# Patient Record
Sex: Female | Born: 1950
Health system: Southern US, Community
[De-identification: ages and names within clinical notes are randomized; demographics above are authoritative.]

## PROBLEM LIST (undated history)

## (undated) ENCOUNTER — Emergency Department (HOSPITAL_COMMUNITY): Admission: EM | Payer: 59 | Source: Home / Self Care

## (undated) DIAGNOSIS — M1990 Unspecified osteoarthritis, unspecified site: Secondary | ICD-10-CM

## (undated) DIAGNOSIS — H5010 Unspecified exotropia: Secondary | ICD-10-CM

## (undated) DIAGNOSIS — N3281 Overactive bladder: Secondary | ICD-10-CM

## (undated) DIAGNOSIS — K219 Gastro-esophageal reflux disease without esophagitis: Secondary | ICD-10-CM

## (undated) DIAGNOSIS — I10 Essential (primary) hypertension: Secondary | ICD-10-CM

## (undated) DIAGNOSIS — H50112 Monocular exotropia, left eye: Secondary | ICD-10-CM

## (undated) DIAGNOSIS — Z973 Presence of spectacles and contact lenses: Secondary | ICD-10-CM

## (undated) DIAGNOSIS — H269 Unspecified cataract: Secondary | ICD-10-CM

## (undated) DIAGNOSIS — Z86018 Personal history of other benign neoplasm: Secondary | ICD-10-CM

## (undated) DIAGNOSIS — D869 Sarcoidosis, unspecified: Secondary | ICD-10-CM

## (undated) DIAGNOSIS — E785 Hyperlipidemia, unspecified: Secondary | ICD-10-CM

## (undated) DIAGNOSIS — D573 Sickle-cell trait: Secondary | ICD-10-CM

## (undated) DIAGNOSIS — M797 Fibromyalgia: Secondary | ICD-10-CM

## (undated) DIAGNOSIS — H3530 Unspecified macular degeneration: Secondary | ICD-10-CM

## (undated) HISTORY — PX: EYE SURGERY: SHX253

## (undated) HISTORY — PX: TONSILLECTOMY: SUR1361

## (undated) HISTORY — DX: Hyperlipidemia, unspecified: E78.5

## (undated) HISTORY — DX: Sickle-cell trait: D57.3

## (undated) HISTORY — DX: Overactive bladder: N32.81

## (undated) HISTORY — DX: Unspecified cataract: H26.9

## (undated) HISTORY — DX: Essential (primary) hypertension: I10

## (undated) HISTORY — DX: Sarcoidosis, unspecified: D86.9

## (undated) HISTORY — PX: KNEE SURGERY: SHX244

## (undated) HISTORY — DX: Gastro-esophageal reflux disease without esophagitis: K21.9

---

## 2010-07-01 ENCOUNTER — Encounter: Payer: Self-pay | Admitting: Internal Medicine

## 2010-07-01 ENCOUNTER — Encounter: Admission: RE | Admit: 2010-07-01 | Discharge: 2010-07-01 | Payer: Self-pay | Admitting: Family Medicine

## 2010-07-01 ENCOUNTER — Ambulatory Visit: Payer: Self-pay | Admitting: Family Medicine

## 2010-07-02 ENCOUNTER — Emergency Department (HOSPITAL_COMMUNITY): Admission: EM | Admit: 2010-07-02 | Discharge: 2010-07-02 | Payer: Self-pay | Admitting: Family Medicine

## 2010-07-09 DIAGNOSIS — D860 Sarcoidosis of lung: Secondary | ICD-10-CM | POA: Insufficient documentation

## 2010-07-09 DIAGNOSIS — R059 Cough, unspecified: Secondary | ICD-10-CM | POA: Insufficient documentation

## 2010-07-09 DIAGNOSIS — D869 Sarcoidosis, unspecified: Secondary | ICD-10-CM

## 2010-07-09 DIAGNOSIS — I10 Essential (primary) hypertension: Secondary | ICD-10-CM | POA: Insufficient documentation

## 2010-07-09 DIAGNOSIS — N3281 Overactive bladder: Secondary | ICD-10-CM | POA: Insufficient documentation

## 2010-07-09 DIAGNOSIS — N318 Other neuromuscular dysfunction of bladder: Secondary | ICD-10-CM

## 2010-07-09 DIAGNOSIS — R05 Cough: Secondary | ICD-10-CM

## 2010-07-11 ENCOUNTER — Ambulatory Visit: Payer: Self-pay | Admitting: Internal Medicine

## 2010-07-29 ENCOUNTER — Ambulatory Visit: Payer: Self-pay | Admitting: Family Medicine

## 2010-08-13 ENCOUNTER — Ambulatory Visit: Payer: Self-pay | Admitting: Internal Medicine

## 2010-09-24 ENCOUNTER — Ambulatory Visit: Payer: Self-pay | Admitting: Internal Medicine

## 2010-10-14 ENCOUNTER — Inpatient Hospital Stay (HOSPITAL_COMMUNITY): Admission: RE | Admit: 2010-10-14 | Discharge: 2010-10-17 | Payer: Self-pay | Admitting: Obstetrics and Gynecology

## 2010-10-14 ENCOUNTER — Encounter (INDEPENDENT_AMBULATORY_CARE_PROVIDER_SITE_OTHER): Payer: Self-pay | Admitting: Obstetrics and Gynecology

## 2010-10-14 HISTORY — PX: TOTAL ABDOMINAL HYSTERECTOMY W/ BILATERAL SALPINGOOPHORECTOMY: SHX83

## 2010-10-25 ENCOUNTER — Inpatient Hospital Stay (HOSPITAL_COMMUNITY)
Admission: AD | Admit: 2010-10-25 | Discharge: 2010-10-25 | Payer: Self-pay | Source: Home / Self Care | Admitting: Obstetrics and Gynecology

## 2010-12-08 ENCOUNTER — Telehealth: Payer: Self-pay | Admitting: Internal Medicine

## 2010-12-30 NOTE — Assessment & Plan Note (Signed)
Summary: Pulmonary/ ext ov with hfa @ 75% p coaching   Copy to:  Dr. Standley Dakins Primary Provider/Referring Provider:  Dr. Standley Dakins  CC:  4 wk followup.  Pt states that her breathing is some better since last seen.  She states that she was put back on prednisone taper 2 wks ago by her PCP due to wheezing- this helped and now she states that wheezing has resolved.  She also states that her cough is gone.  She states that she has noticed worsening vision- blurry x 6 months-.  History of Present Illness: 47 yobf never smoker dx with sarcoid in 2006 originally  presenting with cough dx Dr Gardiner Barefoot in [place] at [facility] by LN bx rec short courses of prednisone and supposed  maintain symbicort  but failed to do so and low grade cough chronically.    July 11, 2010 maintained on ACE cc cough 1st pulmonary office eval moved to GS0 permanently and worse dry  coughing again around first July and didn't find the symbicort worked and main issue is dry cough to point of vomiting. Mild sob mostly with coughing.  rec  Stop enalapril Start benicar 40 one daily (take one half if too strong and get light headed standing) > did fine on one half.  August 13, 2010 4 wk followup.  Pt states that her breathing is some better since last seen.  She states that she was put back on prednisone taper 2 wks ago by her PCP due to wheezing- this helped and now she states that wheezing has resolved.  She also states that her cough is gone.  She states that she has noticed worsening vision- blurry x 6 months- no arthralgias or rash.  Pt denies any significant sore throat, dysphagia, itching, sneezing,  nasal congestion or excess secretions,  fever, chills, sweats, unintended wt loss, pleuritic or exertional cp, hempoptysis,  pnd or leg swelling.  all better today after last dose of Prednisone 9/10.   Current Medications (verified): 1)  Enablex 7.5 Mg Xr24h-Tab (Darifenacin Hydrobromide) .Marland Kitchen.. 1 Once Daily 2)   Amlodipine Besylate 5 Mg Tabs (Amlodipine Besylate) .Marland Kitchen.. 1 Once Daily 3)  Benicar 40 Mg  Tabs (Olmesartan Medoxomil) .... One Tablet By Mouth Daily 4)  Symbicort 160-4.5 Mcg/act Aero (Budesonide-Formoterol Fumarate) .... 2 Puffs Two Times A Day Only If Needed  Allergies (verified): 1)  ! Lortab  Past History:  Past Medical History: OVERACTIVE BLADDER (ICD-596.51) COUGH, CHRONIC (ICD-786.2) HYPERTENSION (ICD-401.9)     - Ace d/c July 11, 2010 due to cough SARCOIDOSIS (ICD-135)    - dx 2006 [facility]    - HFA 25-75% p coaching August 13, 2010   Vital Signs:  Patient profile:   60 year old female Weight:      167 pounds O2 Sat:      97 % on Room air Temp:     97.6 degrees F oral Pulse rate:   76 / minute BP sitting:   140 / 80  (left arm)  Vitals Entered By: Vernie Murders (August 13, 2010 11:40 AM)  O2 Flow:  Room air  Physical Exam  Additional Exam:  wt 163  > 163 July 11, 2010  > 167 August 13, 2010  amb bf with  no pseudowheeze  HEENT: nl dentition, turbinates, and orophanx. Nl external ear canals without cough reflex NECK :  without JVD/Nodes/TM/ nl carotid upstrokes bilaterally LUNGS: no acc muscle use, clear to A and P bilaterally without cough  on insp or exp maneuvers CV:  RRR  no s3 or murmur or increase in P2, no edema  ABD:  soft and nontender with nl excursion in the supine position. No bruits or organomegaly, bowel sounds nl MS:  warm without deformities, calf tenderness, cyanosis or clubbing       Impression & Recommendations:  Problem # 1:  SARCOIDOSIS (ICD-135) ? airway involvement suggested by recurrent cough requiring prednisone off ace and with poor hfa technique  I spent extra time with the patient today explaining optimal mdi  technique.  This improved from  25 -75% p coaching.  The goal with a chronic steroid dependent illness is always arriving at the lowest effective dose that controls the disease/symptoms and not accepting a  set "formula" which is based on statistics that don't take into accound individual variability or the natural hx of the dz in every individual patient, which may well vary over time. For now try to maintain floor of "0" prednisone and have her come back for pft's.  Eye eval strongly encouraged, says can use her husband's eye doctor though can't recall his name.]  See instructions for specific recommendations   Problem # 2:  HYPERTENSION (ICD-401.9)  Her updated medication list for this problem includes:    Amlodipine Besylate 5 Mg Tabs (Amlodipine besylate) .Marland Kitchen... 1 once daily    Benicar 40 Mg Tabs (Olmesartan medoxomil) ..... One tablet by mouth daily  ok off ace, continue benicar for now     Other Orders: Est. Patient Level IV (16109)  Patient Instructions: 1)  Continue benicar 40 one half daily 2)  Symbicort 2 puffs first thing  in am and 2 puffs again in pm about 12 hours later  3)  Work on inhaler technique:  relax and blow all the way out then take a nice smooth deep breath back in, triggering the inhaler at same time you start breathing in and hold a few secs and rinse mouth 4)  Please schedule a follow-up appointment in 6 weeks, sooner if needed with PFT's. 5)  BE SURE TO SEE YOUR EYE DOCTOR AND LET HIM KNOW ABOUT YOUR SARCOIDOSIS

## 2010-12-30 NOTE — Assessment & Plan Note (Signed)
Summary: Pulmonary/ ext f/u hfa 75%   Copy to:  Dr. Standley Dakins Primary Provider/Referring Provider:  Dr. Standley Dakins  CC:  Cough- the same.  History of Present Illness: 48  yobf never smoker dx with sarcoid in 2006 originally  presenting with cough dx Dr Gardiner Barefoot in [place] at [facility] by LN bx rec short courses of prednisone and supposed  maintain symbicort  but failed to do so and low grade cough chronically.    July 11, 2010 maintained on ACE cc cough 1st pulmonary office eval moved to GS0 permanently and worse dry  coughing again around first July and didn't find the symbicort worked and main issue is dry cough to point of vomiting. Mild sob mostly with coughing.  rec  Stop enalapril Start benicar 40 one daily (take one half if too strong and get light headed standing) > did fine on one half.  August 13, 2010 4 wk followup.  Pt states that her breathing is some better since last seen.  She states that she was put back on prednisone taper 2 wks ago by her PCP due to wheezing- this helped and now she states that wheezing has resolved.  She also states that her cough is gone.  She states that she has noticed worsening vision- blurry x 6 months- no arthralgias or rash.  rec Continue benicar 40 one half daily Symbicort 2 puffs first thing  in am and 2 puffs again in pm about 12 hours later  Work on inhaler technique:  . BE SURE TO SEE YOUR EYE DOCTOR AND LET HIM KNOW ABOUT YOUR SARCOIDOSIS  September 24, 2010 ov cc cough resumed three  weeks off prednisone  while on symbicort 160 up to every 4 hours,  dry cough intermittent day > night. no new ocular or articular symptoms, no sob. Pt denies any significant sore throat, dysphagia, itching, sneezing,  nasal congestion or excess secretions,  fever, chills, sweats, unintended wt loss, pleuritic or exertional cp, hempoptysis, change in activity tolerance  orthopnea pnd or leg swelling   Current Medications (verified): 1)  Enablex  7.5 Mg Xr24h-Tab (Darifenacin Hydrobromide) .Marland Kitchen.. 1 Once Daily 2)  Amlodipine Besylate 5 Mg Tabs (Amlodipine Besylate) .Marland Kitchen.. 1 Once Daily 3)  Benicar 40 Mg  Tabs (Olmesartan Medoxomil) .... One Tablet By Mouth Daily 4)  Symbicort 160-4.5 Mcg/act Aero (Budesonide-Formoterol Fumarate) .... 2 Puffs Four Times A Day  Allergies (verified): 1)  ! Lortab  Past History:  Past Medical History: OVERACTIVE BLADDER (ICD-596.51) COUGH, CHRONIC (ICD-786.2) HYPERTENSION (ICD-401.9)     - Ace d/c July 11, 2010 due to cough SARCOIDOSIS (ICD-135)    - dx 2006 [facility]    - HFA 25-75% p coaching August 13, 2010  > 75% September 24, 2010     - PFT's September 24, 2010 >>  VC  2.63 (73%) with DLCO 70% and no obstruction  Vital Signs:  Patient profile:   60 year old female Weight:      171 pounds BMI:     30.40 O2 Sat:      98 % on Room air Temp:     97.8 degrees F oral Pulse rate:   106 / minute BP sitting:   144 / 90  (left arm)  Vitals Entered By: Vernie Murders (September 24, 2010 9:47 AM)  O2 Flow:  Room air  Physical Exam  Additional Exam:  wt 163  > 163 July 11, 2010  > 167 August 13, 2010 > 171  September 24, 2010  amb bf with  no pseudowheeze  HEENT: nl dentition, turbinates, and orophanx. Nl external ear canals without cough reflex NECK :  without JVD/Nodes/TM/ nl carotid upstrokes bilaterally LUNGS: no acc muscle use, clear to A and P bilaterally without cough on insp or exp maneuvers CV:  RRR  no s3 or murmur or increase in P2, no edema  ABD:  soft and nontender with nl excursion in the supine position. No bruits or organomegaly, bowel sounds nl MS:  warm without deformities, calf tenderness, cyanosis or clubbing       Impression & Recommendations:  Problem # 1:  SARCOIDOSIS (ICD-135) The goal with a chronic steroid dependent illness is always arriving at the lowest effective dose that controls the disease/symptoms and not accepting a set "formula" which is based on  statistics that don't take into accound individual variability or the natural hx of the dz in every individual patient, which may well vary over time. for now would like to get by on just topical steroids if possible but first she'll have to learn how to use hfa more effectively  I spent extra time with the patient today explaining optimal mdi  technique.  This improved from  50-75%  Ocular eval pending  Problem # 2:  COUGH, CHRONIC (ICD-786.2)  steroid responsive so probably due to sarcoid but may be developing cyclical component with secondary gerd. try diet first and low threahold to add trial of ppi if worsens rather than restart prednisone  Orders: Est. Patient Level IV (96045)  Medications Added to Medication List This Visit: 1)  Symbicort 160-4.5 Mcg/act Aero (Budesonide-formoterol fumarate) .... 2 puffs four times a day 2)  Symbicort 160-4.5 Mcg/act Aero (Budesonide-formoterol fumarate) .... 2 puffs first thing  in am and 2 puffs again in pm about 12 hours later  Patient Instructions: 1)  Symbicort 160 2 puffs first thing  in am and 2 puffs again in pm about 12 hours later 2)  Work on inhaler technique:  relax and blow all the way out then take a nice smooth deep breath back in, triggering the inhaler at same time you start breathing in 3)  GERD (REFLUX)  is a common cause of respiratory symptoms. It commonly presents without heartburn and can be treated with medication, but also with lifestyle changes including avoidance of late meals, excessive alcohol, smoking cessation, and avoid fatty foods, chocolate, peppermint, colas, red wine, and acidic juices such as orange juice. NO MINT OR MENTHOL PRODUCTS SO NO COUGH DROPS  4)  USE SUGARLESS CANDY INSTEAD (jolley ranchers)  5)  NO OIL BASED VITAMINS  6)  Please schedule a follow-up appointment in 6 weeks, sooner if needed   Clinical Reports Reviewed:  CXR:  07/01/2010: CXR Results:  c/w stage III sarcoid

## 2010-12-30 NOTE — Miscellaneous (Signed)
Summary: Orders Update pft charges  Clinical Lists Changes  Orders: Added new Service order of Carbon Monoxide diffusing w/capacity (94720) - Signed Added new Service order of Spirometry (Pre & Post) (94060) - Signed Added new Service order of Lung Volumes (94240) - Signed 

## 2010-12-30 NOTE — Assessment & Plan Note (Signed)
Summary: Pulmonary consultation/  Sarcoidosis/ cough ? ace   Visit Type:  Initial Consult Copy to:  Dr. Standley Dakins Primary Kathy Kathy [name]/Referring Kathy Kathy [name]:  Dr. Standley Dakins  CC:  Dyspnea.  History of Present Illness: 60 Kathy [name] never smoker dx with sarcoid in 2006 originally  presenting with cough dx Dr Gardiner Barefoot in [place] at [facility] by LN bx rec short courses of prednisone and supposed  maintain symbicort  but failed to do so and low grade cough chronically.    July 11, 2010 maintained on ACE cc cough   1st pulmonary office eval moved to GS0 permanently and worse dry  coughing again around first July and didn't find the symbicort worked and main issue is dry cough to point of vomiting. Mild sob mostly with coughing.  Pt denies any significant sore throat, dysphagia, itching, sneezing,  nasal congestion or excess secretions,  fever, chills, sweats, unintended wt loss, pleuritic or exertional cp, hempoptysis, change in activity tolerance  orthopnea pnd or leg swelling.  Pt also denies any obvious fluctuation in symptoms with weather or environmental change or other alleviating or aggravating factors.       Current Medications (verified): 1)  Enablex 7.5 Mg Xr24h-Tab (Darifenacin Hydrobromide) .Marland Kitchen.. 1 Once Daily 2)  Amlodipine Besylate 5 Mg Tabs (Amlodipine Besylate) .Marland Kitchen.. 1 Once Daily 3)  Enalapril Maleate 20 Mg Tabs (Enalapril Maleate) .Marland Kitchen.. 1 Once Daily 4)  Symbicort 160-4.5 Mcg/act Aero (Budesonide-Formoterol Fumarate) .... 2 Puffs Two Times A Day  Allergies (verified): 1)  ! Lortab  Past History:  Past Medical History: OVERACTIVE BLADDER (ICD-596.51) COUGH, CHRONIC (ICD-786.2) HYPERTENSION (ICD-401.9)     - Ace d/c July 11, 2010 due to cough SARCOIDOSIS (ICD-135)    - dx 2006 [facility]  Past Surgical History: Lung Bx 2006- dx w/ sarcoid Tubal ligation 1978  Family History: Negative for respiratory diseases or atopy   Social  History: Married Children Unemployed Never smoker ETOH- Beer daily  Review of Systems       The patient complains of shortness of breath with activity, productive cough, loss of appetite, weight change, and joint stiffness or pain.  The patient denies shortness of breath at rest, non-productive cough, coughing up blood, chest pain, irregular heartbeats, acid heartburn, indigestion, abdominal pain, difficulty swallowing, sore throat, tooth/dental problems, headaches, nasal congestion/difficulty breathing through nose, sneezing, itching, ear ache, anxiety, depression, hand/feet swelling, rash, change in color of mucus, and fever.    Vital Signs:  Patient profile:   60 year old Kathy [name] Height:      63 inches Weight:      163.50 pounds BMI:     29.07 O2 Sat:      98 % on Room air Temp:     98.2 degrees F oral Pulse rate:   76 / minute BP sitting:   142 / 84  (left arm)  Vitals Entered By: Vernie Murders (July 11, 2010 12:07 PM)  O2 Flow:  Room air  Physical Exam  Additional Exam:  wt 163  > 163 July 11, 2010  amb bf with pseudowheeze resolves with purse lip maneuver  HEENT: nl dentition, turbinates, and orophanx. Nl external ear canals without cough reflex NECK :  without JVD/Nodes/TM/ nl carotid upstrokes bilaterally LUNGS: no acc muscle use, clear to A and P bilaterally without cough on insp or exp maneuvers CV:  RRR  no s3 or murmur or increase in P2, no edema  ABD:  soft and nontender with nl excursion in the supine position.  No bruits or organomegaly, bowel sounds nl MS:  warm without deformities, calf tenderness, cyanosis or clubbing SKIN: warm and dry without lesions   NEURO:  alert, approp, no deficits     CXR  Procedure date:  07/01/2010  Findings:      c/w stage III sarcoid  Impression & Recommendations:  Problem # 1:  COUGH, CHRONIC (ICD-786.2)  DDX of  difficult airways managment all start with A and  include Adherence, Ace Inhibitors, Acid Reflux, Active  Sinus Disease, Alpha 1 Antitripsin deficiency, Anxiety masquerading as Airways dz,  ABPA,  allergy(esp in young), Aspiration (esp in elderly), Adverse effects of DPI,  Active smokers, plus one B  = Beta blocker use..   Ace inhibitor use extremely problematic here and needs to be eliminated x 4-6 weeks befor considering additional w/u   ? Acid reflux: note coughing so hard vomiting so clearly reluxing but this may be effect and not the cause.   Problem # 2:  HYPERTENSION (ICD-401.9)  . The following medications were removed from the medication list:    Enalapril Maleate 20 Mg Tabs (Enalapril maleate) .Marland Kitchen... 1 once daily Her updated medication list for this problem includes:    Amlodipine Besylate 5 Mg Tabs (Amlodipine besylate) .Marland Kitchen... 1 once daily    Benicar 40 Mg Tabs (Olmesartan medoxomil) ..... One tablet by mouth daily   ACE inhibitors are problematic in  pts with airway complaints because  even experienced pulmonologists can't always distinguish ace effects from copd/asthma.  By themselves they don't actually cause a problem, much like oxygen can't by itself start a fire, but they certainly serve as a powerful catalyst or enhancer for any "fire"  or inflammatory process in the upper airway, be it caused by an ET  tube or more commonly reflux (especially in the obese or pts with known GERD or who are on biphoshonates).  In the era of ARB near equivalency until we have a better handle on the reversibility of the airway problem, it just makes sense to avoid ace entirely in the short run and then decide later, having established a level of airway control using a reasonable limited regimen, whether to add back ace but even then being very careful to observe the pt for worsening airway control and number of meds used/ needed to control symptoms.   Given the overlap between her main co from sarcoid and the main c/o from ace effect it just makes the most sense to avoid this class  completely  Orders: Consultation Level V (16109)  Problem # 3:  SARCOIDOSIS (ICD-135) cxr does not suggest active sarcoid but probably ok to use as needed symbicort to control any component of airways dz from this source.  The goal with a chronic steroid dependent illness is always arriving at the lowest effective dose that controls the disease/symptoms and not accepting a set "formula" which is based on statistics that don't take into accound individual variability or the natural hx of the dz in every individual patient, which may well vary over time.  for now will just rx x 6 days to control acute symptoms then regroup in 4 weeks off ace and see where she stands in terms of symptom control.  Medications Added to Medication List This Visit: 1)  Enablex 7.5 Mg Xr24h-tab (Darifenacin hydrobromide) .Marland Kitchen.. 1 once daily 2)  Amlodipine Besylate 5 Mg Tabs (Amlodipine besylate) .Marland Kitchen.. 1 once daily 3)  Enalapril Maleate 20 Mg Tabs (Enalapril maleate) .Marland Kitchen.. 1 once daily 4)  Benicar 40 Mg Tabs (Olmesartan medoxomil) .... One tablet by mouth daily 5)  Symbicort 160-4.5 Mcg/act Aero (Budesonide-formoterol fumarate) .... 2 puffs two times a day 6)  Symbicort 160-4.5 Mcg/act Aero (Budesonide-formoterol fumarate) .... 2 puffs two times a day only if needed 7)  Prednisone 10 Mg Tabs (Prednisone) .... 4 each am x 2days, 2x2days, 1x2days and stop  Patient Instructions: 1)  Stop enalapril 2)  Start benicar 40 one daily (take one half if too strong and get light headed standing) 3)  GERD (REFLUX)  is a common cause of respiratory symptoms. It commonly presents without heartburn and can be treated with medication, but also with lifestyle changes including avoidance of late meals, excessive alcohol, smoking cessation, and avoid fatty foods, chocolate, peppermint, colas, red wine, and acidic juices such as orange juice. NO MINT OR MENTHOL PRODUCTS SO NO COUGH DROPS  4)  USE SUGARLESS CANDY INSTEAD (jolley ranchers)  5)   NO OIL BASED VITAMINS  6)  Please schedule a follow-up appointment in 4  weeks, sooner if needed   Prescriptions: PREDNISONE 10 MG  TABS (PREDNISONE) 4 each am x 2days, 2x2days, 1x2days and stop  #14 x 0   Entered and Authorized by:   Nyoka Cowden MD   Signed by:   Nyoka Cowden MD on 07/11/2010   Method used:   Electronically to        [facility]  [address] [phone][facility] (retail)       [address]       [place], [place]  [address]       Ph: [phone] or [phone]       Fax: [phone]   RxID:   [id number]

## 2010-12-31 ENCOUNTER — Ambulatory Visit (INDEPENDENT_AMBULATORY_CARE_PROVIDER_SITE_OTHER): Payer: 59 | Admitting: Family Medicine

## 2010-12-31 DIAGNOSIS — I10 Essential (primary) hypertension: Secondary | ICD-10-CM

## 2010-12-31 DIAGNOSIS — M25519 Pain in unspecified shoulder: Secondary | ICD-10-CM

## 2010-12-31 DIAGNOSIS — M25569 Pain in unspecified knee: Secondary | ICD-10-CM

## 2011-01-01 NOTE — Progress Notes (Signed)
Summary: refill on benicar  Phone Note Call from Patient Call back at [phone]   Caller: Patient Call For: Hasset Chaviano Reason for Call: Refill Medication Summary of Call: Requests refill on benicar 40mg .[address] Initial call taken by: Darletta Moll,  December 08, 2010 12:04 PM  Follow-up for Phone Call        rx sent. pt aware. Carron Curie CMA  December 08, 2010 12:13 PM     Prescriptions: BENICAR 40 MG  TABS (OLMESARTAN MEDOXOMIL) One tablet by mouth daily  #30 x 6   Entered by:   Carron Curie CMA   Authorized by:   Nyoka Cowden MD   Signed by:   Carron Curie CMA on 12/08/2010   Method used:   [facility]  [address] [phone]* (retail)       [address]       [place], [place]  [address]       Ph: [phone] or [phone]       Fax: [phone]   RxID:   [id number]

## 2011-01-15 ENCOUNTER — Ambulatory Visit (INDEPENDENT_AMBULATORY_CARE_PROVIDER_SITE_OTHER): Payer: 59 | Admitting: Family Medicine

## 2011-01-15 DIAGNOSIS — M25569 Pain in unspecified knee: Secondary | ICD-10-CM

## 2011-01-15 DIAGNOSIS — M2550 Pain in unspecified joint: Secondary | ICD-10-CM

## 2011-01-15 DIAGNOSIS — IMO0001 Reserved for inherently not codable concepts without codable children: Secondary | ICD-10-CM

## 2011-01-25 ENCOUNTER — Inpatient Hospital Stay (INDEPENDENT_AMBULATORY_CARE_PROVIDER_SITE_OTHER)
Admission: RE | Admit: 2011-01-25 | Discharge: 2011-01-25 | Disposition: A | Discharge status: Home / Self Care | Payer: 59 | Source: Ambulatory Visit | Attending: Family Medicine | Admitting: Family Medicine

## 2011-01-25 ENCOUNTER — Emergency Department (HOSPITAL_COMMUNITY): Payer: 59

## 2011-01-25 ENCOUNTER — Ambulatory Visit (HOSPITAL_COMMUNITY): Payer: 59

## 2011-01-25 ENCOUNTER — Emergency Department (HOSPITAL_COMMUNITY)
Admission: EM | Admit: 2011-01-25 | Discharge: 2011-01-26 | Disposition: A | Discharge status: Home / Self Care | Payer: 59 | Attending: Emergency Medicine | Admitting: Emergency Medicine

## 2011-01-25 DIAGNOSIS — R42 Dizziness and giddiness: Secondary | ICD-10-CM | POA: Insufficient documentation

## 2011-01-25 DIAGNOSIS — R51 Headache: Secondary | ICD-10-CM | POA: Insufficient documentation

## 2011-01-25 DIAGNOSIS — D869 Sarcoidosis, unspecified: Secondary | ICD-10-CM | POA: Insufficient documentation

## 2011-01-25 DIAGNOSIS — Z9889 Other specified postprocedural states: Secondary | ICD-10-CM | POA: Insufficient documentation

## 2011-01-25 DIAGNOSIS — I10 Essential (primary) hypertension: Secondary | ICD-10-CM | POA: Insufficient documentation

## 2011-01-25 DIAGNOSIS — Z79899 Other long term (current) drug therapy: Secondary | ICD-10-CM | POA: Insufficient documentation

## 2011-01-25 LAB — POCT I-STAT, CHEM 8
BUN: 15 mg/dL (ref 6–23)
Glucose, Bld: 104 mg/dL — ABNORMAL HIGH (ref 70–99)
HCT: 44 % (ref 36.0–46.0)
TCO2: 27 mmol/L (ref 0–100)

## 2011-01-28 ENCOUNTER — Inpatient Hospital Stay (INDEPENDENT_AMBULATORY_CARE_PROVIDER_SITE_OTHER): Payer: 59 | Admitting: Family Medicine

## 2011-01-28 DIAGNOSIS — I10 Essential (primary) hypertension: Secondary | ICD-10-CM

## 2011-01-28 DIAGNOSIS — Z79899 Other long term (current) drug therapy: Secondary | ICD-10-CM

## 2011-01-28 DIAGNOSIS — G44209 Tension-type headache, unspecified, not intractable: Secondary | ICD-10-CM

## 2011-02-10 LAB — URINALYSIS, ROUTINE W REFLEX MICROSCOPIC
Glucose, UA: NEGATIVE mg/dL
Hgb urine dipstick: NEGATIVE
Nitrite: NEGATIVE
Specific Gravity, Urine: 1.01 (ref 1.005–1.030)

## 2011-02-10 LAB — CBC
HCT: 32.2 % — ABNORMAL LOW (ref 36.0–46.0)
HCT: 43.4 % (ref 36.0–46.0)
Hemoglobin: 10.9 g/dL — ABNORMAL LOW (ref 12.0–15.0)
MCHC: 33.7 g/dL (ref 30.0–36.0)
MCHC: 33.9 g/dL (ref 30.0–36.0)
MCV: 87.3 fL (ref 78.0–100.0)
Platelets: 238 10*3/uL (ref 150–400)
RBC: 3.69 MIL/uL — ABNORMAL LOW (ref 3.87–5.11)
RBC: 5.03 MIL/uL (ref 3.87–5.11)
RDW: 13.7 % (ref 11.5–15.5)
WBC: 4.7 10*3/uL (ref 4.0–10.5)

## 2011-02-10 LAB — MRSA CULTURE

## 2011-02-10 LAB — COMPREHENSIVE METABOLIC PANEL
ALT: 18 U/L (ref 0–35)
Albumin: 4.1 g/dL (ref 3.5–5.2)
Sodium: 140 mEq/L (ref 135–145)

## 2011-02-10 LAB — PROTIME-INR
INR: 1.13 (ref 0.00–1.49)
Prothrombin Time: 14.7 seconds (ref 11.6–15.2)

## 2011-02-10 LAB — HEMOGLOBIN AND HEMATOCRIT, BLOOD: Hemoglobin: 12.6 g/dL (ref 12.0–15.0)

## 2011-02-10 LAB — APTT: aPTT: 38 seconds — ABNORMAL HIGH (ref 24–37)

## 2011-02-10 LAB — SURGICAL PCR SCREEN: MRSA, PCR: INVALID — AB

## 2011-02-26 ENCOUNTER — Ambulatory Visit (INDEPENDENT_AMBULATORY_CARE_PROVIDER_SITE_OTHER): Payer: 59 | Admitting: Family Medicine

## 2011-02-26 DIAGNOSIS — I10 Essential (primary) hypertension: Secondary | ICD-10-CM

## 2011-02-26 DIAGNOSIS — Z79899 Other long term (current) drug therapy: Secondary | ICD-10-CM

## 2011-03-26 ENCOUNTER — Ambulatory Visit (INDEPENDENT_AMBULATORY_CARE_PROVIDER_SITE_OTHER): Payer: 59 | Admitting: Family Medicine

## 2011-03-26 DIAGNOSIS — R300 Dysuria: Secondary | ICD-10-CM

## 2011-03-26 DIAGNOSIS — I10 Essential (primary) hypertension: Secondary | ICD-10-CM

## 2011-03-26 DIAGNOSIS — Z79899 Other long term (current) drug therapy: Secondary | ICD-10-CM

## 2011-08-25 ENCOUNTER — Encounter: Payer: Self-pay | Admitting: Family Medicine

## 2011-08-28 ENCOUNTER — Ambulatory Visit (INDEPENDENT_AMBULATORY_CARE_PROVIDER_SITE_OTHER): Payer: 59 | Admitting: Family Medicine

## 2011-08-28 ENCOUNTER — Encounter: Payer: Self-pay | Admitting: Family Medicine

## 2011-08-28 VITALS — BP 110/80 | HR 95 | Wt 184.0 lb

## 2011-08-28 DIAGNOSIS — M129 Arthropathy, unspecified: Secondary | ICD-10-CM

## 2011-08-28 DIAGNOSIS — IMO0001 Reserved for inherently not codable concepts without codable children: Secondary | ICD-10-CM

## 2011-08-28 DIAGNOSIS — M1990 Unspecified osteoarthritis, unspecified site: Secondary | ICD-10-CM

## 2011-08-28 DIAGNOSIS — Z23 Encounter for immunization: Secondary | ICD-10-CM

## 2011-08-28 DIAGNOSIS — M797 Fibromyalgia: Secondary | ICD-10-CM

## 2011-08-28 NOTE — Patient Instructions (Signed)
Take 2 Aleve twice a day for the next one or 2 weeks. Then call me and let me know how you're doing

## 2011-08-28 NOTE — Progress Notes (Signed)
  Subjective:    Patient ID: Kathy [name], female    DOB: 11-13-1951, 60 y.o.   MRN: [id number]  HPI She continues to have difficulty with tingling sensation in her hands as well as elbow, shoulder pain and knee pain. The record was reviewed from her rheumatologist. He was felt to be multifactorial in nature. She has been using Tylenol without much success.  Review of Systems     Objective:   Physical Exam Alert and in no distress. Normal sensation in her hands. Normal strength. No joint swelling noted in her hands, wrists, elbows, knees or ankles.       Assessment & Plan:   1. Fibromyalgia   2. Arthritis    she is to take 2 Aleve twice a day and call me in one week

## 2011-10-26 ENCOUNTER — Telehealth: Payer: Self-pay | Admitting: Family Medicine

## 2011-10-26 MED ORDER — BUDESONIDE-FORMOTEROL FUMARATE 160-4.5 MCG/ACT IN AERO: 2.0000 | INHALATION_SPRAY | Freq: Two times a day (BID) | RESPIRATORY_TRACT | Status: DC

## 2011-10-26 NOTE — Telephone Encounter (Signed)
Is this okay to refill? 

## 2011-10-26 NOTE — Telephone Encounter (Signed)
Go ahead and refill this 

## 2011-10-26 NOTE — Telephone Encounter (Signed)
Refilled pt med 

## 2011-12-01 HISTORY — PX: ABDOMINAL HYSTERECTOMY: SHX81

## 2012-01-28 ENCOUNTER — Encounter: Payer: Self-pay | Admitting: Family Medicine

## 2012-01-28 ENCOUNTER — Ambulatory Visit (INDEPENDENT_AMBULATORY_CARE_PROVIDER_SITE_OTHER): Payer: 59 | Admitting: Family Medicine

## 2012-01-28 DIAGNOSIS — D869 Sarcoidosis, unspecified: Secondary | ICD-10-CM

## 2012-01-28 DIAGNOSIS — Z79899 Other long term (current) drug therapy: Secondary | ICD-10-CM

## 2012-01-28 DIAGNOSIS — Z23 Encounter for immunization: Secondary | ICD-10-CM

## 2012-01-28 DIAGNOSIS — N318 Other neuromuscular dysfunction of bladder: Secondary | ICD-10-CM

## 2012-01-28 DIAGNOSIS — N3281 Overactive bladder: Secondary | ICD-10-CM

## 2012-01-28 DIAGNOSIS — I10 Essential (primary) hypertension: Secondary | ICD-10-CM

## 2012-01-28 MED ORDER — OLMESARTAN-AMLODIPINE-HCTZ 40-10-12.5 MG PO TABS: 1.0000 | ORAL_TABLET | Freq: Every day | ORAL | Status: DC

## 2012-01-28 MED ORDER — BUDESONIDE-FORMOTEROL FUMARATE 160-4.5 MCG/ACT IN AERO: 2.0000 | INHALATION_SPRAY | Freq: Two times a day (BID) | RESPIRATORY_TRACT | Status: DC

## 2012-01-28 MED ORDER — DARIFENACIN HYDROBROMIDE ER 15 MG PO TB24: 15.0000 mg | ORAL_TABLET | Freq: Every day | ORAL | Status: DC

## 2012-01-28 NOTE — Progress Notes (Signed)
  Subjective:    Patient ID: Kathy [name], female    DOB: [birth year], 60 y.o.   MRN: [id number]  HPI She is here for recheck. She continues to have difficulty with hand pain. She has not responded to anti-inflammatory medications. Review of her record indicates that she has been seen by rheumatology. They recommended if no improvement of this to try Cymbalta. She would also like some of her medicines renewed. She is having no difficulty with her Enablex or her blood pressure medications. He continues on Symbicort for treatment of her pulmonary symptoms from sarcoid.   Review of Systems     Objective:   Physical Exam Alert and in no distress. Exam of her hands shows no swelling or deformity. Good motion of the hands and fingers.      Assessment & Plan:   1. Sarcoid  budesonide-formoterol (SYMBICORT) 160-4.5 MCG/ACT inhaler  2. OAB (overactive bladder)  darifenacin (ENABLEX) 15 MG 24 hr tablet  3. Hypertension  Olmesartan-Amlodipine-HCTZ (TRIBENZOR) 40-10-12.5 MG TABS  4. Encounter for long-term (current) use of other medications     history [place] in one month for recheck. If continued difficulty, referral back to rheumatology will be made.

## 2012-01-28 NOTE — Patient Instructions (Signed)
Take the Effexor 37.5 mg twice a day for the first week and then switched to the 75 mg. Return here in one month

## 2012-02-22 ENCOUNTER — Encounter: Payer: Self-pay | Admitting: Family Medicine

## 2012-02-22 ENCOUNTER — Ambulatory Visit (INDEPENDENT_AMBULATORY_CARE_PROVIDER_SITE_OTHER): Payer: 59 | Admitting: Family Medicine

## 2012-02-22 VITALS — BP 120/70 | HR 96 | Wt 191.0 lb

## 2012-02-22 DIAGNOSIS — IMO0001 Reserved for inherently not codable concepts without codable children: Secondary | ICD-10-CM

## 2012-02-22 DIAGNOSIS — M797 Fibromyalgia: Secondary | ICD-10-CM

## 2012-02-22 MED ORDER — VENLAFAXINE HCL 75 MG PO TABS: 75.0000 mg | ORAL_TABLET | Freq: Two times a day (BID) | ORAL | Status: DC

## 2012-02-22 NOTE — Patient Instructions (Addendum)
Take this medicine twice per day and then call me in a month. Try some Zantac, Axid or Pepcid. He can even double the dose and see what that will do. If that doesn't work let me know We\hen you have palpitations check your pulse and let me know is at regular and how fast is it

## 2012-02-22 NOTE — Progress Notes (Signed)
  Subjective:    Patient ID: Kathy [name], female    DOB: 1951-04-01, 61 y.o.   MRN: [id number]  HPI She is here for a recheck. Since her last visit she did not take the medicines as prescribed. She's been taking 37-1/2 daily and recently went to 75. She is feeling slightly better with her arthralgias and myalgias. She also complains of a dry cough it is especially bothersome at night. She notes that eating an apple makes this worse. She has no acid or heartburn symptoms. She also notes occasional difficulty with palpitation and occasional shortness of breath with exertion but not every time.   Review of Systems     Objective:   Physical Exam Alert and in no distress. Cardiac exam shows regular rhythm without murmurs or gallops. Lungs are clear to auscultation.       Assessment & Plan:   1. Fibromyalgia    I will have her increase the Effexor to 75 mg twice a day. She is to call me in one month. I will also have her keep track of her palpitations. Recommend H2 blocker for her cough. She will let me know how that works

## 2012-03-15 ENCOUNTER — Other Ambulatory Visit: Payer: Self-pay | Admitting: Family Medicine

## 2012-03-21 ENCOUNTER — Other Ambulatory Visit: Payer: Self-pay | Admitting: Family Medicine

## 2012-04-18 ENCOUNTER — Other Ambulatory Visit: Payer: Self-pay

## 2012-04-18 ENCOUNTER — Other Ambulatory Visit: Payer: Self-pay | Admitting: Family Medicine

## 2012-04-18 NOTE — Telephone Encounter (Signed)
IS THIS OK 

## 2012-04-18 NOTE — Telephone Encounter (Signed)
I renewed the medication but have her set up an appointment in one month

## 2012-04-18 NOTE — Telephone Encounter (Signed)
DRLALONDE ALREADY REFILLED

## 2012-04-18 NOTE — Telephone Encounter (Signed)
The medicine will be renewed. She will return here for followup appointment in one month.

## 2012-04-27 ENCOUNTER — Ambulatory Visit (INDEPENDENT_AMBULATORY_CARE_PROVIDER_SITE_OTHER): Payer: 59 | Admitting: Family Medicine

## 2012-04-27 ENCOUNTER — Encounter: Payer: Self-pay | Admitting: Family Medicine

## 2012-04-27 VITALS — BP 130/70 | HR 98 | Wt 189.0 lb

## 2012-04-27 DIAGNOSIS — IMO0001 Reserved for inherently not codable concepts without codable children: Secondary | ICD-10-CM

## 2012-04-27 DIAGNOSIS — M797 Fibromyalgia: Secondary | ICD-10-CM

## 2012-04-27 DIAGNOSIS — I10 Essential (primary) hypertension: Secondary | ICD-10-CM

## 2012-04-27 DIAGNOSIS — H3530 Unspecified macular degeneration: Secondary | ICD-10-CM | POA: Insufficient documentation

## 2012-04-27 DIAGNOSIS — R748 Abnormal levels of other serum enzymes: Secondary | ICD-10-CM

## 2012-04-27 LAB — COMPREHENSIVE METABOLIC PANEL
AST: 21 U/L (ref 0–37)
Alkaline Phosphatase: 96 U/L (ref 39–117)
BUN: 17 mg/dL (ref 6–23)
Creat: 0.87 mg/dL (ref 0.50–1.10)
Total Bilirubin: 0.5 mg/dL (ref 0.3–1.2)
Total Protein: 7.5 g/dL (ref 6.0–8.3)

## 2012-04-27 NOTE — Progress Notes (Signed)
  Subjective:    Patient ID: Kathy [name], female    DOB: August 07, 1951, 61 y.o.   MRN: [id number]  HPI She is here for a followup visit. She is taking Effexor 75 mg twice per day but does sometimes skip at night thinking that it could interfere with her sleep. She does state that her fibromyalgia symptoms are greatly improved with less aches and pains. She continues on her blood pressure medications and is having no difficulty with them. She does have a history of macular degeneration and is being followed by Dr. Mitzi Davenport. Review of her record does indicate an elevated alkaline phosphatase. She has had no, pain, nausea, vomiting. She does have an underlying history of sarcoidosis.   Review of Systems     Objective:   Physical Exam Alert and in no distress. Cardiac exam shows regular rhythm without murmurs or gallops. Lungs are clear to auscultation.       Assessment & Plan:   1. Fibromyalgia    2. Macular degeneration    3. HYPERTENSION    4. Elevated alkaline phosphatase measurement  Comprehensive metabolic panel   recommend she take the Effexor twice per day but take the last one earlier in the evening. She will let me know how it affects her sleeping.

## 2012-04-27 NOTE — Patient Instructions (Signed)
Take the second Effexor earlier in the evening.

## 2012-04-28 NOTE — Progress Notes (Signed)
Quick Note:  The blood work is normal ______ 

## 2012-04-30 ENCOUNTER — Emergency Department (HOSPITAL_COMMUNITY)
Admission: EM | Admit: 2012-04-30 | Discharge: 2012-04-30 | Disposition: A | Discharge status: Home / Self Care | Payer: 59 | Source: Home / Self Care | Attending: Emergency Medicine | Admitting: Emergency Medicine

## 2012-04-30 ENCOUNTER — Encounter (HOSPITAL_COMMUNITY): Payer: Self-pay | Admitting: Emergency Medicine

## 2012-04-30 DIAGNOSIS — M1710 Unilateral primary osteoarthritis, unspecified knee: Secondary | ICD-10-CM

## 2012-04-30 DIAGNOSIS — G8929 Other chronic pain: Secondary | ICD-10-CM

## 2012-04-30 MED ORDER — MELOXICAM 7.5 MG PO TABS: 7.5000 mg | ORAL_TABLET | Freq: Every day | ORAL | Status: DC

## 2012-04-30 NOTE — Discharge Instructions (Signed)
Your exam and symptoms were more consistent with a knee problem probably related to arthritic changes in wear and tear of the right knee. I have recommended you followup with an orthopedic doctor and you can take this medicine for 2 weeks. Other concerns or worsening followup with your primary care Dr. or return for recheck  Degenerative Arthritis You have osteoarthritis. This is the wear and tear arthritis that comes with aging. It is also called degenerative arthritis. This is common in people past middle age. It is caused by stress on the joints. The large weight bearing joints of the lower extremities are most often affected. The knees, hips, back, neck, and hands can become painful, swollen, and stiff. This is the most common type of arthritis. It comes on with age, carrying too much weight, or from an injury. Treatment includes resting the sore joint until the pain and swelling improve. Crutches or a walker may be needed for severe flares. Only take over-the-counter or prescription medicines for pain, discomfort, or fever as directed by your caregiver. Local heat therapy may improve motion. Cortisone shots into the joint are sometimes used to reduce pain and swelling during flares. Osteoarthritis is usually not crippling and progresses slowly. There are things you can do to decrease pain:  Avoid high impact activities.   Exercise regularly.   Low impact exercises such as walking, biking and swimming help to keep the muscles strong and keep normal joint function.   Stretching helps to keep your range of motion.   Lose weight if you are overweight. This reduces joint stress.  In severe cases when you have pain at rest or increasing disability, joint surgery may be helpful. See your caregiver for follow-up treatment as recommended.  SEEK IMMEDIATE MEDICAL CARE IF:   You have severe joint pain.   Marked swelling and redness in your joint develops.   You develop a high fever.  Document  Released: 11/16/2005 Document Revised: 11/05/2011 Document Reviewed: 04/18/2007 [facility] Patient Information 2012 [place], [place].Wear and Tear Disorders of the Knee (Arthritis, Osteoarthritis) Everyone will experience wear and tear injuries (arthritis, osteoarthritis) of the knee. These are the changes we all get as we age. They come from the joint stress of daily living. The amount of cartilage damage in your knee and your symptoms determine if you need surgery. Mild problems require approximately two months recovery time. More severe problems take several months to recover. With mild problems, your surgeon may find worn and rough cartilage surfaces. With severe changes, your surgeon may find cartilage that has completely worn away and exposed the bone. Loose bodies of bone and cartilage, bone spurs (excess bone growth), and injuries to the menisci (cushions between the large bones of your leg) are also common. All of these problems can cause pain. For a mild wear and tear problem, rough cartilage may simply need to be shaved and smoothed. For more severe problems with areas of exposed bone, your surgeon may use an instrument for roughing up the bone surfaces to stimulate new cartilage growth. Loose bodies are usually removed. Torn menisci may be trimmed or repaired. ABOUT THE ARTHROSCOPIC PROCEDURE Arthroscopy is a surgical technique. It allows your orthopedic surgeon to diagnose and treat your knee injury with accuracy. The surgeon looks into your knee through a small scope. The scope is like a small (pencil-sized) telescope. Arthroscopy is less invasive than open knee surgery. You can expect a more rapid recovery. After the procedure, you will be moved to a recovery area  until most of the effects of the medication have worn off. Your caregiver will discuss the test results with you. RECOVERY The severity of the arthritis and the type of procedure performed will determine recovery time. Other important  factors include age, physical condition, medical conditions, and the type of rehabilitation program. Strengthening your muscles after arthroscopy helps guarantee a better recovery. Follow your caregiver's instructions. Use crutches, rest, elevate, ice, and do knee exercises as instructed. Your caregivers will help you and instruct you with exercises and other physical therapy required to regain your mobility, muscle strength, and functioning following surgery. Only take over-the-counter or prescription medicines for pain, discomfort, or fever as directed by your caregiver.  SEEK MEDICAL CARE IF:   There is increased bleeding (more than a small spot) from the wound.   You notice redness, swelling, or increasing pain in the wound.   Pus is coming from wound.   You develop an unexplained oral temperature above 102 F (38.9 C) , or as your caregiver suggests.   You notice a foul smell coming from the wound or dressing.   You have severe pain with motion of the knee.  SEEK IMMEDIATE MEDICAL CARE IF:   You develop a rash.   You have difficulty breathing.   You have any allergic problems.  MAKE SURE YOU:   Understand these instructions.   Will watch your condition.   Will get help right away if you are not doing well or get worse.  Document Released: 11/13/2000 Document Revised: 11/05/2011 Document Reviewed: 04/11/2008 [facility] Patient Information 2012 [place], [place].

## 2012-04-30 NOTE — ED Notes (Signed)
Pain in right knee since Wednesday, noticed swelling today.  Denies recent injury.  Reports some pain in right knee that worsens a lot with weight bearing.  History of knee problems, received cortisone injection in knee approx 3-4 years ago.

## 2012-04-30 NOTE — ED Provider Notes (Addendum)
History     CSN: [id number]  Arrival date & time 04/30/12  1753   First MD Initiated Contact with Patient 04/30/12 1755      Chief Complaint  Patient presents with  . Knee Pain    (Consider location/radiation/quality/duration/timing/severity/associated sxs/prior treatment) HPI Comments: Patient has been experiencing right knee pain since Wednesday non-recent trauma or injury. Describes she had had knee problems with the same knee requiring localized injections. The 3-4 years ago. For the last 2 days the right knee has gotten progressively swollen and it's tender when she walks on it or when there is any weight on it. Denies any constitutional symptoms such as fevers, malaise generalized body aches. Denies any recent fall or injuries or twisting or torsion injuries.       Patient is a 61 y.o. female presenting with knee pain. The history is provided by the patient.  Knee Pain This is a new problem. The current episode started more than 2 days ago. The problem occurs constantly. The problem has not changed since onset.The symptoms are aggravated by walking, bending and standing. The symptoms are relieved by rest. She has tried nothing for the symptoms. The treatment provided mild relief.    Past Medical History  Diagnosis Date  . Hypertension   . Sarcoidosis   . Overactive bladder   . Uterine leiomyoma     Past Surgical History  Procedure Date  . Abdominal hysterectomy     History reviewed. No pertinent family history.  History  Substance Use Topics  . Smoking status: Never Smoker   . Smokeless tobacco: Never Used  . Alcohol Use: Yes    OB History    Grav Para Term Preterm Abortions TAB SAB Ect Mult Living                  Review of Systems  Constitutional: Positive for activity change. Negative for fever, chills, diaphoresis, fatigue and unexpected weight change.  Musculoskeletal: Positive for joint swelling. Negative for myalgias.  Skin: Negative for rash.     Allergies  Hydrocodone-acetaminophen  Home Medications   Current Outpatient Rx  Name Route Sig Dispense Refill  . ACETAMINOPHEN 325 MG PO TABS Oral Take 650 mg by mouth every 6 (six) hours as needed.      . BUDESONIDE-FORMOTEROL FUMARATE 160-4.5 MCG/ACT IN AERO Inhalation Inhale 2 puffs into the lungs 2 (two) times daily. 1 Inhaler 11  . DARIFENACIN HYDROBROMIDE ER 15 MG PO TB24 Oral Take 1 tablet (15 mg total) by mouth daily. 30 tablet 11  . MELOXICAM 7.5 MG PO TABS Oral Take 1 tablet (7.5 mg total) by mouth daily. 14 tablet 0  . TRIBENZOR 40-10-12.5 MG PO TABS  TAKE 1 TABLET BY MOUTH EVERY DAY 30 tablet 11  . VENLAFAXINE HCL 75 MG PO TABS        BP 141/93  Pulse 96  Temp(Src) 98.7 F (37.1 C) (Oral)  Resp 20  SpO2 100%  Physical Exam  Nursing note and vitals reviewed. Constitutional: No distress.  Musculoskeletal: She exhibits tenderness.       Right knee: She exhibits swelling, deformity and bony tenderness. She exhibits no ecchymosis, no laceration, no erythema, normal alignment, no LCL laxity, normal patellar mobility and no MCL laxity. tenderness found. Medial joint line, lateral joint line and patellar tendon tenderness noted.       Legs: Skin: No rash noted. She is not diaphoretic. No erythema. No pallor.    ED Course  Procedures (including critical  care time)  Labs Reviewed - No data to display No results found.   1. Chronic knee pain, right   2. Arthritis of knee       MDM  None trauma knee pain. Exam and history was consistent with degenerative joint changes as patient history suggest she has had localized injections. Clinically she has a stable knee with no increased laxity and no signs of a septic joint. Rest of her lower extremity does not look swollen compared to opposite side. Otherwise patient to followup with her primary care Dr. to return if any further concerns but most importantly consider she needs to establish care with an orthopedic Dr.  patient agree with treatment plan and followup care as discussed   Jimmie Molly, MD 04/30/12 1900  Jimmie Molly, MD 04/30/12 1902

## 2012-05-01 ENCOUNTER — Emergency Department (HOSPITAL_COMMUNITY): Payer: 59

## 2012-05-01 ENCOUNTER — Emergency Department (HOSPITAL_COMMUNITY)
Admission: EM | Admit: 2012-05-01 | Discharge: 2012-05-01 | Disposition: A | Discharge status: Home / Self Care | Payer: 59 | Attending: Emergency Medicine | Admitting: Emergency Medicine

## 2012-05-01 ENCOUNTER — Encounter (HOSPITAL_COMMUNITY): Payer: Self-pay | Admitting: *Deleted

## 2012-05-01 DIAGNOSIS — R5381 Other malaise: Secondary | ICD-10-CM | POA: Insufficient documentation

## 2012-05-01 DIAGNOSIS — I10 Essential (primary) hypertension: Secondary | ICD-10-CM | POA: Insufficient documentation

## 2012-05-01 DIAGNOSIS — R110 Nausea: Secondary | ICD-10-CM | POA: Insufficient documentation

## 2012-05-01 DIAGNOSIS — R55 Syncope and collapse: Secondary | ICD-10-CM | POA: Insufficient documentation

## 2012-05-01 DIAGNOSIS — R5383 Other fatigue: Secondary | ICD-10-CM | POA: Insufficient documentation

## 2012-05-01 LAB — CBC
HCT: 39.5 % (ref 36.0–46.0)
Hemoglobin: 14.1 g/dL (ref 12.0–15.0)
MCH: 29.9 pg (ref 26.0–34.0)
MCHC: 35.7 g/dL (ref 30.0–36.0)

## 2012-05-01 LAB — URINALYSIS, ROUTINE W REFLEX MICROSCOPIC
Glucose, UA: NEGATIVE mg/dL
Protein, ur: NEGATIVE mg/dL
pH: 7.5 (ref 5.0–8.0)

## 2012-05-01 LAB — URINE MICROSCOPIC-ADD ON

## 2012-05-01 LAB — CARDIAC PANEL(CRET KIN+CKTOT+MB+TROPI)
Relative Index: 1.5 (ref 0.0–2.5)
Total CK: 182 U/L — ABNORMAL HIGH (ref 7–177)
Troponin I: <0.3 ng/mL (ref <0.30)

## 2012-05-01 LAB — DIFFERENTIAL
Basophils Relative: 1 % (ref 0–1)
Eosinophils Absolute: 0.1 10*3/uL (ref 0.0–0.7)
Monocytes Absolute: 0.5 10*3/uL (ref 0.1–1.0)
Monocytes Relative: 7 % (ref 3–12)
Neutrophils Relative %: 72 % (ref 43–77)

## 2012-05-01 LAB — COMPREHENSIVE METABOLIC PANEL
Albumin: 3.9 g/dL (ref 3.5–5.2)
BUN: 22 mg/dL (ref 6–23)
Creatinine, Ser: 0.74 mg/dL (ref 0.50–1.10)
Total Protein: 7.2 g/dL (ref 6.0–8.3)

## 2012-05-01 MED ORDER — ONDANSETRON HCL 4 MG/2ML IJ SOLN
4.0000 mg | Freq: Once | INTRAMUSCULAR | Status: AC
  Administered 2012-05-01: 4 mg via INTRAVENOUS
  Filled 2012-05-01: qty 2

## 2012-05-01 MED ORDER — ONDANSETRON HCL 4 MG PO TABS: 4.0000 mg | ORAL_TABLET | Freq: Four times a day (QID) | ORAL | Status: AC

## 2012-05-01 MED ORDER — SODIUM CHLORIDE 0.9 % IV BOLUS (SEPSIS)
1000.0000 mL | Freq: Once | INTRAVENOUS | Status: AC
  Administered 2012-05-01: 1000 mL via INTRAVENOUS

## 2012-05-01 MED ORDER — POTASSIUM CHLORIDE CRYS ER 20 MEQ PO TBCR
20.0000 meq | EXTENDED_RELEASE_TABLET | Freq: Once | ORAL | Status: AC
  Administered 2012-05-01: 20 meq via ORAL
  Filled 2012-05-01: qty 1

## 2012-05-01 NOTE — ED Notes (Signed)
Pt arrived by [facility], had syncopal episode while at church. Was lethargic and diaphoretic on ems arrival, cbg 107.

## 2012-05-01 NOTE — ED Notes (Signed)
Walked pt to end of hall way. Pt tolerated. No dizziness.

## 2012-05-01 NOTE — ED Provider Notes (Signed)
History     CSN: [id number]  Arrival date & time 05/01/12  8295   First MD Initiated Contact with Patient 05/01/12 [phone]      Chief Complaint  Patient presents with  . Loss of Consciousness    (Consider location/radiation/quality/duration/timing/severity/associated sxs/prior treatment) HPI Comments: Patient was in church in the choir loft. Upon standing she became near syncopal, overheated and with nausea and abdominal pain diffusely. Upon leaving church service she had a syncopal event. Did not strike her head. No prior history of same. No chest pain, shortness of breath.  Patient is a 61 y.o. female presenting with syncope. The history is provided by the patient. No language interpreter was used.  Loss of Consciousness This is a new problem. The current episode started less than 1 hour ago. The problem has been gradually improving. Associated symptoms include abdominal pain. Pertinent negatives include no chest pain, no headaches and no shortness of breath. The symptoms are aggravated by nothing. The symptoms are relieved by nothing. She has tried nothing for the symptoms. The treatment provided no relief.    Past Medical History  Diagnosis Date  . Hypertension   . Sarcoidosis   . Overactive bladder   . Uterine leiomyoma     Past Surgical History  Procedure Date  . Abdominal hysterectomy     History reviewed. No pertinent family history.  History  Substance Use Topics  . Smoking status: Never Smoker   . Smokeless tobacco: Never Used  . Alcohol Use: Yes    OB History    Grav Para Term Preterm Abortions TAB SAB Ect Mult Living                  Review of Systems  Constitutional: Positive for fatigue. Negative for fever, chills, activity change and appetite change.  HENT: Negative for congestion, sore throat, rhinorrhea, neck pain and neck stiffness.   Respiratory: Negative for cough and shortness of breath.   Cardiovascular: Positive for syncope. Negative for chest  pain and palpitations.  Gastrointestinal: Positive for nausea and abdominal pain. Negative for vomiting.  Genitourinary: Negative for dysuria, urgency, frequency and flank pain.  Musculoskeletal: Negative for myalgias, back pain and arthralgias.  Neurological: Positive for syncope. Negative for dizziness, weakness, light-headedness, numbness and headaches.  All other systems reviewed and are negative.    Allergies  Hydrocodone-acetaminophen  Home Medications   Current Outpatient Rx  Name Route Sig Dispense Refill  . ACETAMINOPHEN 325 MG PO TABS Oral Take 650 mg by mouth every 6 (six) hours as needed.      . BUDESONIDE-FORMOTEROL FUMARATE 160-4.5 MCG/ACT IN AERO Inhalation Inhale 2 puffs into the lungs 2 (two) times daily. 1 Inhaler 11  . DARIFENACIN HYDROBROMIDE ER 15 MG PO TB24 Oral Take 1 tablet (15 mg total) by mouth daily. 30 tablet 11  . TRIBENZOR 40-10-12.5 MG PO TABS  TAKE 1 TABLET BY MOUTH EVERY DAY 30 tablet 11  . VENLAFAXINE HCL 75 MG PO TABS Oral Take 150 mg by mouth daily.     Marland Kitchen ONDANSETRON HCL 4 MG PO TABS Oral Take 1 tablet (4 mg total) by mouth every 6 (six) hours. 12 tablet 0    BP 123/79  Pulse 90  Temp(Src) 97.6 F (36.4 C) (Oral)  Resp 20  SpO2 100%  Physical Exam  Nursing note and vitals reviewed. Constitutional: She is oriented to person, place, and time. She appears well-developed and well-nourished. No distress.  HENT:  Head: Normocephalic and atraumatic.  Mouth/Throat:  Oropharynx is clear and moist.  Eyes: Conjunctivae and EOM are normal. Pupils are equal, round, and reactive to light.  Neck: Normal range of motion. Neck supple.  Cardiovascular: Normal rate, regular rhythm, normal heart sounds and intact distal pulses.  Exam reveals no gallop and no friction rub.   No murmur heard. Pulmonary/Chest: Effort normal and breath sounds normal. No respiratory distress. She exhibits no tenderness.  Abdominal: Soft. Bowel sounds are normal. There is no  tenderness. There is no rebound and no guarding.  Musculoskeletal: Normal range of motion. She exhibits no edema and no tenderness.  Neurological: She is alert and oriented to person, place, and time. No cranial nerve deficit.  Skin: Skin is warm and dry. No rash noted.    ED Course  Procedures (including critical care time)   Date: 05/01/2012  Rate: 65  Rhythm: normal sinus rhythm  QRS Axis: normal  Intervals: normal - borderline prolonged qt  ST/T Wave abnormalities: normal  Conduction Disutrbances:none  Narrative Interpretation:   Old EKG Reviewed: unchanged  Labs Reviewed  COMPREHENSIVE METABOLIC PANEL - Abnormal; Notable for the following:    Potassium 3.4 (*)    Glucose, Bld 104 (*)    All other components within normal limits  URINALYSIS, ROUTINE W REFLEX MICROSCOPIC - Abnormal; Notable for the following:    APPearance CLOUDY (*)    Leukocytes, UA TRACE (*)    All other components within normal limits  CARDIAC PANEL(CRET KIN+CKTOT+MB+TROPI) - Abnormal; Notable for the following:    Total CK 182 (*)    All other components within normal limits  CBC  DIFFERENTIAL  LACTIC ACID, PLASMA  GLUCOSE, CAPILLARY  URINE MICROSCOPIC-ADD ON   Dg Chest 2 View  05/01/2012  *RADIOLOGY REPORT*  Clinical Data: Syncope.  Fall  CHEST - 2 VIEW  Comparison: 07/01/2010  Findings: Heart size is normal.  There is no pleural effusion or edema.  There are patchy opacities within the right upper lobe which appear to be similar from 07/01/2010 and represent a chronic abnormality.  Remaining portions of the lungs are clear.  IMPRESSION:  1.  Chronic airspace opacity within the right upper lobe which may represent scarring. 2.  No evidence for acute pneumonia or CHF.  Original Report Authenticated By: Rosealee Albee, M.D.     1. Vasovagal syncope       MDM  Vasovagal syncopal event. Her potassium was replaced. She received 2 L of IV fluid was able to cannulate around the department without  difficulty. She will be discharged home with instructions to continue aggressive oral hydration at home. I have no concern that this is secondary to a malignant cardiac arrhythmia. She did have borderline prolonged QT however I do not feel this is the cause as she had prodromal sx and viral like sx.  Instructed to follow up with pcp.  Provided strict return precautions        Dayton Bailiff, MD 05/01/12 [phone]

## 2012-05-01 NOTE — Discharge Instructions (Signed)
Syncope You have had a fainting (syncopal) spell. A fainting episode is a sudden, short-lived loss of consciousness. It results in complete recovery. It occurs because there has been a temporary shortage of oxygen and/or sugar (glucose) to the brain. CAUSES   Blood pressure pills and other medications that may lower blood pressure below normal. Sudden changes in posture (sudden standing).   Over-medication. Take your medications as directed.   Standing too long. This can cause blood to pool in the legs.   Seizure disorders.   Low blood sugar (hypoglycemia) of diabetes. This more commonly causes coma.   Bearing down to go to the bathroom. This can cause your blood pressure to rise suddenly. Your body compensates by making the blood pressure too low when you stop bearing down.   Hardening of the arteries where the brain temporarily does not receive enough blood.   Irregular heart beat and circulatory problems.   Fear, emotional distress, injury, sight of blood, or illness.  Your caregiver will send you home if the syncope was from non-worrisome causes (benign). Depending on your age and health, you may stay to be monitored and observed. If you return home, have someone stay with you if your caregiver feels that is desirable. It is very important to keep all follow-up referrals and appointments in order to properly manage this condition. This is a serious problem which can lead to serious illness and death if not carefully managed.  WARNING: Do not drive or operate machinery until your caregiver feels that it is safe for you to do so. SEEK IMMEDIATE MEDICAL CARE IF:   You have another fainting episode or faint while lying or sitting down. DO NOT DRIVE YOURSELF. Call 911 if no other help is available.   You have chest pain, are feeling sick to your stomach (nausea), vomiting or abdominal pain.   You have an irregular heartbeat or one that is very fast (pulse over 120 beats per minute).    You have a loss of feeling in some part of your body or lose movement in your arms or legs.   You have difficulty with speech, confusion, severe weakness, or visual problems.   You become sweaty and/or feel light headed.  Make sure you are rechecked as instructed. Document Released: 11/16/2005 Document Revised: 11/05/2011 Document Reviewed: 07/07/2007 [employer] Patient Information 2012 [employer]. 

## 2012-05-04 ENCOUNTER — Encounter: Payer: Self-pay | Admitting: Family Medicine

## 2012-05-04 ENCOUNTER — Ambulatory Visit
Admission: RE | Admit: 2012-05-04 | Discharge: 2012-05-04 | Disposition: A | Discharge status: Home / Self Care | Payer: 59 | Source: Ambulatory Visit | Attending: Family Medicine | Admitting: Family Medicine

## 2012-05-04 ENCOUNTER — Ambulatory Visit (INDEPENDENT_AMBULATORY_CARE_PROVIDER_SITE_OTHER): Payer: 59 | Admitting: Family Medicine

## 2012-05-04 VITALS — BP 120/80 | HR 91 | Wt 189.0 lb

## 2012-05-04 DIAGNOSIS — M25561 Pain in right knee: Secondary | ICD-10-CM

## 2012-05-04 DIAGNOSIS — R109 Unspecified abdominal pain: Secondary | ICD-10-CM

## 2012-05-04 DIAGNOSIS — M25569 Pain in unspecified knee: Secondary | ICD-10-CM

## 2012-05-04 DIAGNOSIS — R55 Syncope and collapse: Secondary | ICD-10-CM

## 2012-05-04 MED ORDER — ESOMEPRAZOLE MAGNESIUM 40 MG PO CPDR: 40.0000 mg | DELAYED_RELEASE_CAPSULE | Freq: Every day | ORAL | Status: DC

## 2012-05-04 NOTE — Progress Notes (Signed)
  Subjective:    Patient ID: Kathy [name], female    DOB: 05-05-1951, 61 y.o.   MRN: [id number]  HPI She is here for consult after recent emergency room visit. The emergency room visits were reviewed. One was for right knee pain the other one was for abdominal pain and subsequent syncopal episode. She describes having abdominal pain followed by weakness, sweating and nausea. The ER record was reviewed. Since then she has had one more episode that occurred after she cookie and she again developed sweating nausea and weakness. She's had no vomiting, diarrhea, abdominal pain. She was seen in March for evaluation of a cough that I thought might be reflux related. She has been using TUMS and Mylanta but never did try an H2 blocker that I recommended. She also complains of a several year history of right knee pain and gives a remote history of having injections in that knee when she lived in [place], [place]. She states that now any physical activity causes pain. She's had no numbness, tingling. He states that also just sitting and sometimes causes pain. She was given meloxicam which he states does help with her pain. She has an appointment to be seen in orthopedic office later this week.  Review of Systems     Objective:   Physical Exam alert and in no distress. Tympanic membranes and canals are normal. Throat is clear. Tonsils are normal. Neck is supple without adenopathy or thyromegaly. Cardiac exam shows a regular sinus rhythm without murmurs or gallops. Lungs are clear to auscultation. Right knee exam shows no effusion. There is no tenderness to palpation. Ligaments are intact. McMurray's testing negative.      Assessment & Plan:   1. Right knee pain   2. Abdominal pain   3. Syncopal episodes    samples of Nexium given with instructions on use. I will order an x-ray and give her advice concerning proper followup after that.

## 2012-05-04 NOTE — Patient Instructions (Signed)
Take the Nexium for the next 10 days and then call me. Let me know how it has helped with your abdominal pain and coughing. I will call you back with the results of the x-ray.

## 2012-05-05 ENCOUNTER — Telehealth: Payer: Self-pay | Admitting: Family Medicine

## 2012-05-05 NOTE — Telephone Encounter (Signed)
Please call patient, she states she was given Rx Nexium and she apparently has read some information about Nexium and liver issues and she states she has liver problems and is concerned abut taking the medication. Told patient it may be tomorrow before she receives a call back, she states she prefers to hold off on taking the Nexium until she hears from someone

## 2012-06-18 ENCOUNTER — Other Ambulatory Visit: Payer: Self-pay | Admitting: Family Medicine

## 2012-06-20 NOTE — Telephone Encounter (Signed)
Effexor were renewed

## 2012-06-20 NOTE — Telephone Encounter (Signed)
Is this ok?

## 2012-06-20 NOTE — Telephone Encounter (Signed)
Rx refill request for Effexor.

## 2012-09-20 ENCOUNTER — Telehealth: Payer: Self-pay | Admitting: Internal Medicine

## 2012-09-20 DIAGNOSIS — N3281 Overactive bladder: Secondary | ICD-10-CM

## 2012-09-20 DIAGNOSIS — D869 Sarcoidosis, unspecified: Secondary | ICD-10-CM

## 2012-09-20 MED ORDER — MELOXICAM 7.5 MG PO TABS: 7.5000 mg | ORAL_TABLET | Freq: Every day | ORAL | Status: DC

## 2012-09-20 MED ORDER — DARIFENACIN HYDROBROMIDE ER 15 MG PO TB24: 15.0000 mg | ORAL_TABLET | Freq: Every day | ORAL | Status: DC

## 2012-09-20 MED ORDER — BUDESONIDE-FORMOTEROL FUMARATE 160-4.5 MCG/ACT IN AERO: 2.0000 | INHALATION_SPRAY | Freq: Two times a day (BID) | RESPIRATORY_TRACT | Status: DC

## 2012-09-20 MED ORDER — VENLAFAXINE HCL 75 MG PO TABS: 75.0000 mg | ORAL_TABLET | Freq: Two times a day (BID) | ORAL | Status: DC

## 2012-09-20 NOTE — Telephone Encounter (Signed)
Pt also needs a refill on symbicort inhaler and effexor 75mg 

## 2012-09-23 ENCOUNTER — Other Ambulatory Visit (INDEPENDENT_AMBULATORY_CARE_PROVIDER_SITE_OTHER): Payer: 59

## 2012-09-23 ENCOUNTER — Other Ambulatory Visit: Payer: Self-pay | Admitting: Internal Medicine

## 2012-09-23 DIAGNOSIS — Z23 Encounter for immunization: Secondary | ICD-10-CM

## 2012-09-23 MED ORDER — OLMESARTAN-AMLODIPINE-HCTZ 40-10-12.5 MG PO TABS: ORAL_TABLET | ORAL | Status: DC

## 2012-09-23 NOTE — Telephone Encounter (Signed)
[facility] was requesting tribenzor tablet for 90 days. Sent it in with a 90 day with 1 refill.

## 2012-09-28 ENCOUNTER — Telehealth: Payer: Self-pay | Admitting: Family Medicine

## 2012-09-28 NOTE — Telephone Encounter (Signed)
Have her set up a visit

## 2012-09-28 NOTE — Telephone Encounter (Signed)
Please call having side effects from "venlefaxine", can't sleep, nightmares and talking in sleep   meloxicam 7.5  She takes it twice a day but her refill states once a day

## 2012-09-29 NOTE — Telephone Encounter (Signed)
Left message on phone for pt to make appt to discuss her issues

## 2012-12-26 ENCOUNTER — Telehealth: Payer: Self-pay | Admitting: Internal Medicine

## 2012-12-26 NOTE — Telephone Encounter (Signed)
She can have Detrol LA one per day

## 2012-12-26 NOTE — Telephone Encounter (Signed)
PT CALLED BACK THE INS.COMPANY TOLD HER THAT THESE MEDS ARE COVERED DETROL LA, OXYBUTYNIN, TOVIAZ, DITROTAN XL PLEASE ADVISE WHAT YOU WANT TO GIVE HER

## 2012-12-26 NOTE — Telephone Encounter (Signed)
PT INFORMEDAND SHE WILL CALL INS COMPANY TO SEE WHAT IS COVERED AND CALL us BACK

## 2012-12-26 NOTE — Telephone Encounter (Signed)
Have her check with her insurance comany to find out what they will cover

## 2012-12-27 ENCOUNTER — Other Ambulatory Visit: Payer: Self-pay

## 2012-12-27 MED ORDER — TOLTERODINE TARTRATE ER 2 MG PO CP24: 2.0000 mg | ORAL_CAPSULE | Freq: Every day | ORAL | Status: DC

## 2012-12-27 NOTE — Telephone Encounter (Signed)
Sent in [employer] per [employer]

## 2013-03-09 ENCOUNTER — Other Ambulatory Visit: Payer: Self-pay | Admitting: Family Medicine

## 2013-05-17 ENCOUNTER — Other Ambulatory Visit: Payer: Self-pay | Admitting: Family Medicine

## 2013-06-07 ENCOUNTER — Telehealth: Payer: Self-pay | Admitting: Internal Medicine

## 2013-06-07 NOTE — Telephone Encounter (Signed)
Faxed over medical records to [employer]

## 2013-06-08 ENCOUNTER — Ambulatory Visit (INDEPENDENT_AMBULATORY_CARE_PROVIDER_SITE_OTHER): Payer: 59 | Admitting: Family Medicine

## 2013-06-08 ENCOUNTER — Encounter: Payer: Self-pay | Admitting: Family Medicine

## 2013-06-08 VITALS — BP 122/78 | HR 90 | Wt 187.0 lb

## 2013-06-08 DIAGNOSIS — N318 Other neuromuscular dysfunction of bladder: Secondary | ICD-10-CM

## 2013-06-08 DIAGNOSIS — Z79899 Other long term (current) drug therapy: Secondary | ICD-10-CM

## 2013-06-08 DIAGNOSIS — I10 Essential (primary) hypertension: Secondary | ICD-10-CM

## 2013-06-08 DIAGNOSIS — D869 Sarcoidosis, unspecified: Secondary | ICD-10-CM

## 2013-06-08 LAB — LIPID PANEL
HDL: 58 mg/dL (ref >39)
LDL Cholesterol: 162 mg/dL — ABNORMAL HIGH (ref 0–99)
Total CHOL/HDL Ratio: 4.4 Ratio

## 2013-06-08 LAB — COMPREHENSIVE METABOLIC PANEL
ALT: 18 U/L (ref 0–35)
AST: 22 U/L (ref 0–37)
Albumin: 4.7 g/dL (ref 3.5–5.2)
Alkaline Phosphatase: 104 U/L (ref 39–117)
Potassium: 4 mEq/L (ref 3.5–5.3)
Sodium: 140 mEq/L (ref 135–145)
Total Bilirubin: 0.5 mg/dL (ref 0.3–1.2)
Total Protein: 7.4 g/dL (ref 6.0–8.3)

## 2013-06-08 LAB — CBC WITH DIFFERENTIAL/PLATELET
Eosinophils Absolute: 0.2 10*3/uL (ref 0.0–0.7)
Eosinophils Relative: 2 % (ref 0–5)
HCT: 41.7 % (ref 36.0–46.0)
Lymphocytes Relative: 39 % (ref 12–46)
Lymphs Abs: 2.7 10*3/uL (ref 0.7–4.0)
MCH: 27 pg (ref 26.0–34.0)
MCV: 80.3 fL (ref 78.0–100.0)
Monocytes Absolute: 0.7 10*3/uL (ref 0.1–1.0)
Platelets: 305 10*3/uL (ref 150–400)
RBC: 5.19 MIL/uL — ABNORMAL HIGH (ref 3.87–5.11)
RDW: 13.3 % (ref 11.5–15.5)
WBC: 6.8 10*3/uL (ref 4.0–10.5)

## 2013-06-08 MED ORDER — ALBUTEROL SULFATE HFA 108 (90 BASE) MCG/ACT IN AERS: 2.0000 | INHALATION_SPRAY | Freq: Four times a day (QID) | RESPIRATORY_TRACT | Status: DC | PRN

## 2013-06-08 MED ORDER — OLMESARTAN-AMLODIPINE-HCTZ 40-10-12.5 MG PO TABS: ORAL_TABLET | ORAL | Status: DC

## 2013-06-08 MED ORDER — TOLTERODINE TARTRATE ER 4 MG PO CP24: 4.0000 mg | ORAL_CAPSULE | Freq: Every day | ORAL | Status: DC

## 2013-06-08 MED ORDER — MOMETASONE FUROATE 220 MCG/INH IN AEPB: 2.0000 | INHALATION_SPRAY | Freq: Every day | RESPIRATORY_TRACT | Status: DC

## 2013-06-08 NOTE — Progress Notes (Signed)
  Subjective:    Patient ID: Kathy [name], female    DOB: [birth year], 62 y.o.   MRN: [id number]  HPI He is here for medication check. She is doing quite nicely on her blood pressure medication however the Detrol is not totally controlling her OAB. She would like a stronger dose. She needs to be switched to a different pulmonary medications since her insurance will not cover her present medication. She has no other concerns or complaints.   Review of Systems     Objective:   Physical Exam Alert and in no distress. Lungs are clear to auscultation. Cardiac exam shows regular rhythm without murmurs or gallops.       Assessment & Plan:  HYPERTENSION - Plan: Olmesartan-Amlodipine-HCTZ (TRIBENZOR) 40-10-12.5 MG TABS  OVERACTIVE BLADDER - Plan: tolterodine (DETROL LA) 4 MG 24 hr capsule  Sarcoidosis - Plan: mometasone (ASMANEX 30 METERED DOSES) 220 MCG/INH inhaler, albuterol (PROVENTIL HFA;VENTOLIN HFA) 108 (90 BASE) MCG/ACT inhaler  Encounter for long-term (current) use of other medications - Plan: CBC with Differential, Lipid panel, Comprehensive metabolic panel, mometasone (ASMANEX 30 METERED DOSES) 220 MCG/INH inhaler  Started her on the use of the Asmanex. Also will increase her Detrol and she will let me know how this works. Also discussed use of a rescue inhaler. Gave instructions on use.

## 2013-06-08 NOTE — Patient Instructions (Addendum)
Usee inhaler once per day but if you have trouble and need more let me know Use the albuterol as needed and if you have to use it more than twice a week or twice a month then call me

## 2013-06-09 NOTE — Progress Notes (Signed)
Quick Note:  Mailed pt letter of labs and diet info ______

## 2013-06-16 ENCOUNTER — Telehealth: Payer: Self-pay | Admitting: Family Medicine

## 2013-06-16 MED ORDER — TOLTERODINE TARTRATE ER 2 MG PO CP24: 2.0000 mg | ORAL_CAPSULE | Freq: Every day | ORAL | Status: DC

## 2013-06-16 NOTE — Telephone Encounter (Signed)
Apparently the pharmaceutical company is out of 4 mg pills so I will give her 2 of the 2 mg pills

## 2013-08-17 ENCOUNTER — Other Ambulatory Visit: Payer: Self-pay | Admitting: Family Medicine

## 2013-09-12 ENCOUNTER — Other Ambulatory Visit: Payer: 59

## 2013-09-13 ENCOUNTER — Other Ambulatory Visit (INDEPENDENT_AMBULATORY_CARE_PROVIDER_SITE_OTHER): Payer: 59

## 2013-09-13 DIAGNOSIS — Z23 Encounter for immunization: Secondary | ICD-10-CM

## 2013-10-23 ENCOUNTER — Other Ambulatory Visit: Payer: Self-pay | Admitting: Family Medicine

## 2014-01-16 ENCOUNTER — Telehealth: Payer: Self-pay | Admitting: Family Medicine

## 2014-01-16 ENCOUNTER — Ambulatory Visit (INDEPENDENT_AMBULATORY_CARE_PROVIDER_SITE_OTHER): Payer: 59 | Admitting: Family Medicine

## 2014-01-16 VITALS — BP 130/88 | HR 100 | Temp 98.2°F | Wt 204.0 lb

## 2014-01-16 DIAGNOSIS — D869 Sarcoidosis, unspecified: Secondary | ICD-10-CM

## 2014-01-16 DIAGNOSIS — J069 Acute upper respiratory infection, unspecified: Secondary | ICD-10-CM

## 2014-01-16 DIAGNOSIS — R05 Cough: Secondary | ICD-10-CM

## 2014-01-16 DIAGNOSIS — R059 Cough, unspecified: Secondary | ICD-10-CM

## 2014-01-16 MED ORDER — ALBUTEROL SULFATE HFA 108 (90 BASE) MCG/ACT IN AERS: 2.0000 | INHALATION_SPRAY | Freq: Four times a day (QID) | RESPIRATORY_TRACT | Status: DC | PRN

## 2014-01-16 NOTE — Telephone Encounter (Signed)
Phoned pt to advise of screening mammogram at [place]. [place] at Feb 24 10:00   No lotions, no powder, no deoderant. [facility]

## 2014-01-16 NOTE — Telephone Encounter (Signed)
Phoned pt to find out who she uses for her mammogram.  [facility].

## 2014-01-16 NOTE — Progress Notes (Signed)
   Subjective:    Patient ID: Kathy [name], female    DOB: 01-17-1951, 63 y.o.   MRN: [id number]  HPI She has a six-day history this started with sore throat, runny nose, coughing with wheezing, chills but no fever. She does have underlying gliosis and presently is on Asmanex regularly.   Review of Systems     Objective:   Physical Exam alert and in no distress. Tympanic membranes and canals are normal. Throat is clear. Tonsils are normal. Neck is supple without adenopathy or thyromegaly. Cardiac exam shows a regular sinus rhythm without murmurs or gallops. Lungs are clear to auscultation.        Assessment & Plan:  Sarcoidosis - Plan: albuterol (PROVENTIL HFA;VENTOLIN HFA) 108 (90 BASE) MCG/ACT inhaler  COUGH, CHRONIC - Plan: albuterol (PROVENTIL HFA;VENTOLIN HFA) 108 (90 BASE) MCG/ACT inhaler  Acute URI  if no improvement by Thursday, she will call for an antibiotic

## 2014-01-16 NOTE — Patient Instructions (Signed)
If you have not turned the corner by Thursday call me and I will call in an antibiotic

## 2014-01-17 ENCOUNTER — Telehealth: Payer: Self-pay | Admitting: Family Medicine

## 2014-01-17 NOTE — Telephone Encounter (Signed)
Pt aware of [facility] appt.

## 2014-01-18 ENCOUNTER — Telehealth: Payer: Self-pay | Admitting: Family Medicine

## 2014-01-18 MED ORDER — BENZONATATE 100 MG PO CAPS: 100.0000 mg | ORAL_CAPSULE | Freq: Three times a day (TID) | ORAL | Status: DC | PRN

## 2014-01-18 NOTE — Telephone Encounter (Signed)
Tessalon called in for difficulty with continued coughing

## 2014-01-18 NOTE — Telephone Encounter (Signed)
At her know that I called in a cough medicine for her

## 2014-01-18 NOTE — Telephone Encounter (Signed)
Pt.notified

## 2014-01-23 LAB — HM MAMMOGRAPHY

## 2014-02-06 ENCOUNTER — Encounter: Payer: Self-pay | Admitting: Internal Medicine

## 2014-02-08 ENCOUNTER — Other Ambulatory Visit: Payer: Self-pay | Admitting: Family Medicine

## 2014-04-03 ENCOUNTER — Telehealth: Payer: Self-pay | Admitting: Family Medicine

## 2014-04-03 ENCOUNTER — Other Ambulatory Visit: Payer: Self-pay | Admitting: Family Medicine

## 2014-04-03 DIAGNOSIS — D869 Sarcoidosis, unspecified: Secondary | ICD-10-CM

## 2014-04-03 DIAGNOSIS — R05 Cough: Secondary | ICD-10-CM

## 2014-04-03 DIAGNOSIS — R059 Cough, unspecified: Secondary | ICD-10-CM

## 2014-04-03 MED ORDER — ALBUTEROL SULFATE HFA 108 (90 BASE) MCG/ACT IN AERS: 2.0000 | INHALATION_SPRAY | Freq: Four times a day (QID) | RESPIRATORY_TRACT | Status: DC | PRN

## 2014-04-03 NOTE — Telephone Encounter (Signed)
Albuterol called in.

## 2014-04-04 MED ORDER — ALBUTEROL SULFATE HFA 108 (90 BASE) MCG/ACT IN AERS: 2.0000 | INHALATION_SPRAY | Freq: Four times a day (QID) | RESPIRATORY_TRACT | Status: DC | PRN

## 2014-04-04 NOTE — Telephone Encounter (Signed)
Albuterol inhaler was sent to wrong pharmacy and pt called wanting it sent to [facility]

## 2014-06-11 ENCOUNTER — Other Ambulatory Visit: Payer: Self-pay | Admitting: Family Medicine

## 2014-06-14 ENCOUNTER — Other Ambulatory Visit: Payer: Self-pay | Admitting: Family Medicine

## 2014-06-30 ENCOUNTER — Other Ambulatory Visit: Payer: Self-pay | Admitting: Family Medicine

## 2014-08-30 ENCOUNTER — Ambulatory Visit
Admission: RE | Admit: 2014-08-30 | Discharge: 2014-08-30 | Disposition: A | Discharge status: Home / Self Care | Payer: 59 | Source: Ambulatory Visit | Attending: Family Medicine | Admitting: Family Medicine

## 2014-08-30 ENCOUNTER — Ambulatory Visit (INDEPENDENT_AMBULATORY_CARE_PROVIDER_SITE_OTHER): Payer: 59 | Admitting: Family Medicine

## 2014-08-30 ENCOUNTER — Encounter: Payer: Self-pay | Admitting: Family Medicine

## 2014-08-30 VITALS — BP 130/80 | HR 86 | Wt 183.0 lb

## 2014-08-30 DIAGNOSIS — I10 Essential (primary) hypertension: Secondary | ICD-10-CM

## 2014-08-30 DIAGNOSIS — E785 Hyperlipidemia, unspecified: Secondary | ICD-10-CM

## 2014-08-30 DIAGNOSIS — N318 Other neuromuscular dysfunction of bladder: Secondary | ICD-10-CM

## 2014-08-30 DIAGNOSIS — D869 Sarcoidosis, unspecified: Secondary | ICD-10-CM

## 2014-08-30 DIAGNOSIS — Z23 Encounter for immunization: Secondary | ICD-10-CM

## 2014-08-30 LAB — CBC WITH DIFFERENTIAL/PLATELET
Basophils Absolute: 0.1 10*3/uL (ref 0.0–0.1)
Basophils Relative: 1 % (ref 0–1)
EOS PCT: 2 % (ref 0–5)
Eosinophils Absolute: 0.1 10*3/uL (ref 0.0–0.7)
HCT: 41.1 % (ref 36.0–46.0)
HEMOGLOBIN: 14.2 g/dL (ref 12.0–15.0)
Lymphocytes Relative: 42 % (ref 12–46)
Lymphs Abs: 2.5 10*3/uL (ref 0.7–4.0)
MCH: 28.5 pg (ref 26.0–34.0)
MCHC: 34.5 g/dL (ref 30.0–36.0)
MCV: 82.5 fL (ref 78.0–100.0)
MONO ABS: 0.5 10*3/uL (ref 0.1–1.0)
MONOS PCT: 9 % (ref 3–12)
Neutro Abs: 2.7 10*3/uL (ref 1.7–7.7)
Neutrophils Relative %: 46 % (ref 43–77)
Platelets: 316 10*3/uL (ref 150–400)
RBC: 4.98 MIL/uL (ref 3.87–5.11)
RDW: 12.7 % (ref 11.5–15.5)
WBC: 5.9 10*3/uL (ref 4.0–10.5)

## 2014-08-30 LAB — COMPREHENSIVE METABOLIC PANEL
ALBUMIN: 4.6 g/dL (ref 3.5–5.2)
ALT: 10 U/L (ref 0–35)
AST: 20 U/L (ref 0–37)
Alkaline Phosphatase: 84 U/L (ref 39–117)
BILIRUBIN TOTAL: 0.5 mg/dL (ref 0.2–1.2)
BUN: 13 mg/dL (ref 6–23)
CO2: 27 mEq/L (ref 19–32)
Calcium: 10 mg/dL (ref 8.4–10.5)
Chloride: 101 mEq/L (ref 96–112)
Creat: 0.78 mg/dL (ref 0.50–1.10)
GLUCOSE: 79 mg/dL (ref 70–99)
POTASSIUM: 4.2 meq/L (ref 3.5–5.3)
Sodium: 140 mEq/L (ref 135–145)
Total Protein: 7.1 g/dL (ref 6.0–8.3)

## 2014-08-30 LAB — LIPID PANEL
Cholesterol: 246 mg/dL — ABNORMAL HIGH (ref 0–200)
HDL: 67 mg/dL (ref >39)
LDL Cholesterol: 156 mg/dL — ABNORMAL HIGH (ref 0–99)
TRIGLYCERIDES: 115 mg/dL (ref <150)
Total CHOL/HDL Ratio: 3.7 Ratio
VLDL: 23 mg/dL (ref 0–40)

## 2014-08-30 NOTE — Progress Notes (Signed)
   Subjective:    Patient ID: Kathy [name], female    DOB: 1951/04/10, 63 y.o.   MRN: [id number]  HPI She is here for medication check. She does have a previous history of sarcoidosis diagnosed in 2005. She has been on inhalers since that time and has not ever stopped. She has not had any followup evaluation concerning this for several years. Review of record also indicates elevated lipid panel. She does complain of overactive bladder and presently is on medication for this. She also admits to drinking a lot of fluids and notes that this also increases her bladder symptoms.   Review of Systems     Objective:   Physical Exam Alert and in no distress. Cardiac exam shows regular rhythm without murmurs or gallops. Lungs are clear to auscultation.       Assessment & Plan:  Essential hypertension - Plan: CBC with Differential, Comprehensive metabolic panel  OVERACTIVE BLADDER  Immunization due - Plan: Flu Vaccine QUAD 36+ mos IM, Pneumococcal conjugate vaccine 13-valent  Sarcoidosis - Plan: DG Chest 2 View, Pneumococcal conjugate vaccine 13-valent  Hyperlipidemia LDL goal <100 - Plan: Lipid panel  recommend she cut back on her fluids to see if this helps with her bladder symptoms. She will keep me informed concerning this. I will get a chest x-ray to assess further her sarcoidosis and need for continuing on her inhalers. She is hopeful that we will be able to stop her inhalers.

## 2014-12-10 ENCOUNTER — Other Ambulatory Visit: Payer: Self-pay | Admitting: Family Medicine

## 2015-01-05 LAB — HM MAMMOGRAPHY

## 2015-02-05 ENCOUNTER — Encounter: Payer: Self-pay | Admitting: Family Medicine

## 2015-02-06 ENCOUNTER — Encounter: Payer: Self-pay | Admitting: Internal Medicine

## 2015-02-12 ENCOUNTER — Telehealth: Payer: Self-pay | Admitting: Internal Medicine

## 2015-02-12 NOTE — Telephone Encounter (Signed)
Pt is going to Dr. Posey Pronto tomorrow @ 1:00pm and was told she needed a referral due to [facility]. i told her i would find out if a referral is needed and fax over and if not i would not do anything.  She is under her husband insurance which is [facility] [employer] and when I keyed in his ID # on the [facility] website, the ID # [id number] that she gave me it said came up (REFFERALS ARE NOT NEEDED FOR THIS PATIENT)

## 2015-02-18 ENCOUNTER — Other Ambulatory Visit: Payer: Self-pay | Admitting: Family Medicine

## 2015-04-03 ENCOUNTER — Telehealth: Payer: Self-pay | Admitting: Family Medicine

## 2015-04-03 NOTE — Telephone Encounter (Signed)
[place] t# [phone] request EKG to be done here for surgical release, is this ok?  Please advise Sindy Messing

## 2015-04-03 NOTE — Telephone Encounter (Signed)
Schedule an appointment.

## 2015-04-03 NOTE — Telephone Encounter (Signed)
[facility] to let them know that pt needs OV, had to leave message in their "general mailbox."

## 2015-04-04 ENCOUNTER — Encounter: Payer: Self-pay | Admitting: Family Medicine

## 2015-04-04 ENCOUNTER — Ambulatory Visit (INDEPENDENT_AMBULATORY_CARE_PROVIDER_SITE_OTHER): Payer: 59 | Admitting: Family Medicine

## 2015-04-04 VITALS — BP 110/70 | HR 80 | Wt 185.4 lb

## 2015-04-04 DIAGNOSIS — Z01818 Encounter for other preprocedural examination: Secondary | ICD-10-CM

## 2015-04-04 DIAGNOSIS — I10 Essential (primary) hypertension: Secondary | ICD-10-CM | POA: Diagnosis not present

## 2015-04-04 NOTE — Progress Notes (Signed)
   Subjective:    Patient ID: Kathy [name], female    DOB: 1951-06-27, 64 y.o.   MRN: [id number]  HPI She is here for a consult concerning preoperative evaluation. She is getting ready for eye surgery. Her surgeon would like an EKG. She has had no difficulty with cardiac or pulmonary issues. No chest pain, shortness of breath, DOE or PND.  Review of Systems     Objective:   Physical Exam Alert and in no distress. EKG shows no acute changes. Cardiac exam shows regular rhythm without murmurs or gallops. Lungs are clear to auscultation.       Assessment & Plan:  Essential hypertension - Plan: EKG 12-Lead  Preoperative evaluation to rule out surgical contraindication - Plan: EKG 37-CHYI She certainly seems to be a good candidate for surgery.

## 2015-04-08 ENCOUNTER — Encounter (HOSPITAL_BASED_OUTPATIENT_CLINIC_OR_DEPARTMENT_OTHER): Payer: Self-pay | Admitting: *Deleted

## 2015-04-08 DIAGNOSIS — H5010 Unspecified exotropia: Secondary | ICD-10-CM | POA: Diagnosis present

## 2015-04-08 NOTE — H&P (Signed)
Kathy [name] is an 64 y.o. female.   Chief Complaint:  Exotropia c Macular ivolvement 2nd to fixation loss OS. HPI: 64yo BF c Exotropia OS 2nd to fixation loss due to central areolar retinal atrophy OS and profound VA loss OS. Pt presents for Left Lateral Rectus Resection, Left Medial Rectus resection c Adjustable sutures to correct XT.  Past Medical History  Diagnosis Date  . Hypertension   . Sarcoidosis   . Overactive bladder   . Uterine leiomyoma     Past Surgical History  Procedure Laterality Date  . Abdominal hysterectomy      No family history on file. Social History:  reports that she has never smoked. She has never used smokeless tobacco. She reports that she drinks alcohol. She reports that she does not use illicit drugs.  Allergies:  Allergies  Allergen Reactions  . Hydrocodone-Acetaminophen     REACTION: swelling    No prescriptions prior to admission    No results found for this or any previous visit (from the past 48 hour(s)). No results found.  Review of Systems  Constitutional: Negative.   Eyes: Positive for blurred vision.  Respiratory:       Lung Dz  Cardiovascular:       Hypertension  Skin: Negative.     There were no vitals taken for this visit. Physical Exam  Constitutional: She appears well-developed and well-nourished.  Eyes: Conjunctivae are normal. Pupils are equal, round, and reactive to light. Left eye exhibits abnormal extraocular motion.  Neck: Normal range of motion.  Cardiovascular: Normal rate and regular rhythm.   Respiratory: Effort normal.  GI: Soft. Bowel sounds are normal.  Musculoskeletal: Normal range of motion.  Neurological: She is alert.     Assessment/Plan Schedule LLR Recession OS Schedule LMR Resection OS c Adjustable suture placement Schedule 1 week post-op f/u  Joselle Deeds A 04/08/2015, 11:12 AM

## 2015-04-08 NOTE — Progress Notes (Addendum)
NPO AFTER MN.  ARRIVE AT 0830.  NEEDS ISTAT AND EKG.  CURRENT CXR IN CHART AND EPIC. WILL TAKE DETROL AM DOS W/ SIPS OF WATER.

## 2015-04-09 DIAGNOSIS — M797 Fibromyalgia: Secondary | ICD-10-CM | POA: Diagnosis not present

## 2015-04-09 DIAGNOSIS — M1990 Unspecified osteoarthritis, unspecified site: Secondary | ICD-10-CM | POA: Diagnosis not present

## 2015-04-09 DIAGNOSIS — H3530 Unspecified macular degeneration: Secondary | ICD-10-CM | POA: Diagnosis not present

## 2015-04-09 DIAGNOSIS — H532 Diplopia: Secondary | ICD-10-CM | POA: Diagnosis not present

## 2015-04-09 DIAGNOSIS — D869 Sarcoidosis, unspecified: Secondary | ICD-10-CM | POA: Diagnosis not present

## 2015-04-09 DIAGNOSIS — H3589 Other specified retinal disorders: Secondary | ICD-10-CM | POA: Diagnosis not present

## 2015-04-09 DIAGNOSIS — H5010 Unspecified exotropia: Secondary | ICD-10-CM | POA: Diagnosis present

## 2015-04-09 DIAGNOSIS — I10 Essential (primary) hypertension: Secondary | ICD-10-CM | POA: Diagnosis not present

## 2015-04-09 DIAGNOSIS — H50112 Monocular exotropia, left eye: Secondary | ICD-10-CM | POA: Diagnosis not present

## 2015-04-09 NOTE — H&P (Signed)
Kathy [name] is an 64 y.o. female.   Chief Complaint: Exotropia HPI: Loss of Va os : Sensory exotropia  Past Medical History  Diagnosis Date  . Hypertension   . Sarcoidosis   . Overactive bladder   . Macular degeneration   . Fibromyalgia   . Exotropia of left eye   . History of uterine leiomyoma   . OA (osteoarthritis)   . Wears glasses     Past Surgical History  Procedure Laterality Date  . Total abdominal hysterectomy w/ bilateral salpingoophorectomy  10-14-2010    and TVT midurethral sling and culdoplasty  . Tonsillectomy  as child    History reviewed. No pertinent family history. Social History:  reports that she has never smoked. She has never used smokeless tobacco. She reports that she drinks alcohol. She reports that she does not use illicit drugs.  Allergies:  Allergies  Allergen Reactions  . Hydrocodone-Acetaminophen Nausea And Vomiting    No prescriptions prior to admission    No results found for this or any previous visit (from the past 48 hour(s)). No results found.  ROS  Height 5\' 3"  (1.6 m). Physical Exam   Assessment/Plan Strabismus repair os.  Rodarius Kichline A 04/09/2015, 8:08 AM

## 2015-04-09 NOTE — Anesthesia Preprocedure Evaluation (Addendum)
Anesthesia Evaluation  Patient identified by MRN, date of birth, ID band Patient awake    Reviewed: Allergy & Precautions, NPO status , Patient's Chart, lab work & pertinent test results  History of Anesthesia Complications Negative for: history of anesthetic complications  Airway Mallampati: II  TM Distance: >3 FB Neck ROM: Full    Dental no notable dental hx. (+) Dental Advisory Given   Pulmonary neg pulmonary ROS,  breath sounds clear to auscultation  Pulmonary exam normal       Cardiovascular hypertension, Pt. on medications Normal cardiovascular examRhythm:Regular Rate:Normal     Neuro/Psych negative psych ROS   GI/Hepatic negative GI ROS, Neg liver ROS,   Endo/Other  negative endocrine ROS  Renal/GU negative Renal ROS  negative genitourinary   Musculoskeletal  (+) Arthritis -, Fibromyalgia -  Abdominal   Peds negative pediatric ROS (+)  Hematology negative hematology ROS (+)   Anesthesia Other Findings Hx of Sarcoidosis, reports mostly pulmonary involvement but has had improvement in symptoms over past several month, exercises 5 times a week at the senior program at the [facility], CXR 10/15: Mild scarring right upper lobe, stable. No new opacity. No consolidation. No adenopathy.   Reproductive/Obstetrics negative OB ROS                           Anesthesia Physical Anesthesia Plan  ASA: III  Anesthesia Plan: General   Post-op Pain Management:    Induction: Intravenous  Airway Management Planned: LMA  Additional Equipment:   Intra-op Plan:   Post-operative Plan: Extubation in OR  Informed Consent: I have reviewed the patients History and Physical, chart, labs and discussed the procedure including the risks, benefits and alternatives for the proposed anesthesia with the patient or authorized representative who has indicated his/her understanding and acceptance.   Dental  advisory given  Plan Discussed with: CRNA  Anesthesia Plan Comments:        Anesthesia Quick Evaluation

## 2015-04-10 ENCOUNTER — Encounter (HOSPITAL_BASED_OUTPATIENT_CLINIC_OR_DEPARTMENT_OTHER)
Admission: RE | Disposition: A | Discharge status: Home / Self Care | Payer: Self-pay | Source: Ambulatory Visit | Attending: Ophthalmology

## 2015-04-10 ENCOUNTER — Encounter (HOSPITAL_BASED_OUTPATIENT_CLINIC_OR_DEPARTMENT_OTHER): Payer: Self-pay | Admitting: *Deleted

## 2015-04-10 ENCOUNTER — Ambulatory Visit (HOSPITAL_BASED_OUTPATIENT_CLINIC_OR_DEPARTMENT_OTHER)
Admission: RE | Admit: 2015-04-10 | Discharge: 2015-04-10 | Disposition: A | Discharge status: Home / Self Care | Payer: 59 | Source: Ambulatory Visit | Attending: Ophthalmology | Admitting: Ophthalmology

## 2015-04-10 ENCOUNTER — Other Ambulatory Visit: Payer: Self-pay

## 2015-04-10 ENCOUNTER — Ambulatory Visit (HOSPITAL_BASED_OUTPATIENT_CLINIC_OR_DEPARTMENT_OTHER): Payer: 59 | Admitting: Anesthesiology

## 2015-04-10 DIAGNOSIS — M797 Fibromyalgia: Secondary | ICD-10-CM | POA: Insufficient documentation

## 2015-04-10 DIAGNOSIS — H532 Diplopia: Secondary | ICD-10-CM | POA: Insufficient documentation

## 2015-04-10 DIAGNOSIS — H50112 Monocular exotropia, left eye: Secondary | ICD-10-CM | POA: Diagnosis not present

## 2015-04-10 DIAGNOSIS — H5010 Unspecified exotropia: Secondary | ICD-10-CM | POA: Diagnosis present

## 2015-04-10 DIAGNOSIS — D869 Sarcoidosis, unspecified: Secondary | ICD-10-CM | POA: Insufficient documentation

## 2015-04-10 DIAGNOSIS — I10 Essential (primary) hypertension: Secondary | ICD-10-CM | POA: Insufficient documentation

## 2015-04-10 DIAGNOSIS — H3530 Unspecified macular degeneration: Secondary | ICD-10-CM | POA: Diagnosis not present

## 2015-04-10 DIAGNOSIS — H3589 Other specified retinal disorders: Secondary | ICD-10-CM | POA: Insufficient documentation

## 2015-04-10 DIAGNOSIS — M1990 Unspecified osteoarthritis, unspecified site: Secondary | ICD-10-CM | POA: Insufficient documentation

## 2015-04-10 HISTORY — DX: Fibromyalgia: M79.7

## 2015-04-10 HISTORY — PX: ADJUSTABLE SUTURE MANIPULATION: SHX5290

## 2015-04-10 HISTORY — DX: Unspecified macular degeneration: H35.30

## 2015-04-10 HISTORY — DX: Presence of spectacles and contact lenses: Z97.3

## 2015-04-10 HISTORY — DX: Personal history of other benign neoplasm: Z86.018

## 2015-04-10 HISTORY — PX: MEDIAN RECTUS REPAIR: SHX5301

## 2015-04-10 HISTORY — DX: Monocular exotropia, left eye: H50.112

## 2015-04-10 HISTORY — DX: Unspecified exotropia: H50.10

## 2015-04-10 HISTORY — DX: Unspecified osteoarthritis, unspecified site: M19.90

## 2015-04-10 LAB — POCT I-STAT 4, (NA,K, GLUC, HGB,HCT)
Glucose, Bld: 86 mg/dL (ref 70–99)
HEMATOCRIT: 41 % (ref 36.0–46.0)
HEMOGLOBIN: 13.9 g/dL (ref 12.0–15.0)
POTASSIUM: 3.6 mmol/L (ref 3.5–5.1)
SODIUM: 142 mmol/L (ref 135–145)

## 2015-04-10 SURGERY — ADJUSTABLE SUTURE MANIPULATION
Anesthesia: Topical | Site: Eye | Laterality: Left

## 2015-04-10 SURGERY — REPAIR, MUSCLE, MEDIAL RECTUS
Anesthesia: General | Site: Eye | Laterality: Left

## 2015-04-10 MED ORDER — MIDAZOLAM HCL 5 MG/5ML IJ SOLN
INTRAMUSCULAR | Status: DC | PRN
  Administered 2015-04-10: 1 mg via INTRAVENOUS

## 2015-04-10 MED ORDER — FENTANYL CITRATE (PF) 100 MCG/2ML IJ SOLN
25.0000 ug | INTRAMUSCULAR | Status: DC | PRN
  Filled 2015-04-10: qty 1

## 2015-04-10 MED ORDER — FENTANYL CITRATE (PF) 100 MCG/2ML IJ SOLN
INTRAMUSCULAR | Status: AC
  Filled 2015-04-10: qty 4

## 2015-04-10 MED ORDER — ONDANSETRON HCL 4 MG/2ML IJ SOLN
INTRAMUSCULAR | Status: DC | PRN
  Administered 2015-04-10: 4 mg via INTRAVENOUS

## 2015-04-10 MED ORDER — LACTATED RINGERS IV SOLN
INTRAVENOUS | Status: DC
  Administered 2015-04-10: 09:00:00 via INTRAVENOUS
  Filled 2015-04-10: qty 1000

## 2015-04-10 MED ORDER — TOBRAMYCIN-DEXAMETHASONE 0.3-0.1 % OP OINT: TOPICAL_OINTMENT | OPHTHALMIC | Status: DC | PRN

## 2015-04-10 MED ORDER — DEXAMETHASONE SODIUM PHOSPHATE 4 MG/ML IJ SOLN
INTRAMUSCULAR | Status: DC | PRN
  Administered 2015-04-10: 10 mg via INTRAVENOUS

## 2015-04-10 MED ORDER — ACETAMINOPHEN 10 MG/ML IV SOLN
INTRAVENOUS | Status: DC | PRN
  Administered 2015-04-10: 1000 mg via INTRAVENOUS

## 2015-04-10 MED ORDER — KETOROLAC TROMETHAMINE 30 MG/ML IJ SOLN
INTRAMUSCULAR | Status: DC | PRN
  Administered 2015-04-10: 30 mg via INTRAVENOUS

## 2015-04-10 MED ORDER — ONDANSETRON HCL 4 MG/2ML IJ SOLN
4.0000 mg | Freq: Once | INTRAMUSCULAR | Status: DC | PRN
  Filled 2015-04-10: qty 2

## 2015-04-10 MED ORDER — TOBRAMYCIN-DEXAMETHASONE 0.3-0.1 % OP OINT
TOPICAL_OINTMENT | OPHTHALMIC | Status: DC | PRN
  Administered 2015-04-10: 1 via OPHTHALMIC

## 2015-04-10 MED ORDER — LIDOCAINE HCL (CARDIAC) 20 MG/ML IV SOLN
INTRAVENOUS | Status: DC | PRN
  Administered 2015-04-10: 80 mg via INTRAVENOUS

## 2015-04-10 MED ORDER — PHENYLEPHRINE HCL 2.5 % OP SOLN
OPHTHALMIC | Status: DC | PRN
  Administered 2015-04-10: 3 [drp] via OPHTHALMIC

## 2015-04-10 MED ORDER — MIDAZOLAM HCL 2 MG/2ML IJ SOLN
INTRAMUSCULAR | Status: AC
  Filled 2015-04-10: qty 2

## 2015-04-10 MED ORDER — PROPOFOL 10 MG/ML IV BOLUS
INTRAVENOUS | Status: DC | PRN
  Administered 2015-04-10: 150 mg via INTRAVENOUS

## 2015-04-10 MED ORDER — TOBRAMYCIN-DEXAMETHASONE 0.3-0.1 % OP OINT: 1.0000 "application " | TOPICAL_OINTMENT | Freq: Two times a day (BID) | OPHTHALMIC | Status: DC

## 2015-04-10 MED ORDER — BSS IO SOLN
INTRAOCULAR | Status: DC | PRN
  Administered 2015-04-10: 15 mL via INTRAOCULAR

## 2015-04-10 MED ORDER — TETRACAINE HCL 0.5 % OP SOLN
OPHTHALMIC | Status: DC | PRN
  Administered 2015-04-10: 6 [drp] via OPHTHALMIC

## 2015-04-10 MED ORDER — STERILE WATER FOR IRRIGATION IR SOLN
Status: DC | PRN
  Administered 2015-04-10: 500 mL

## 2015-04-10 MED ORDER — ACETAMINOPHEN-CODEINE #2 300-15 MG PO TABS: 1.0000 | ORAL_TABLET | ORAL | Status: DC | PRN

## 2015-04-10 MED ORDER — FENTANYL CITRATE (PF) 100 MCG/2ML IJ SOLN
INTRAMUSCULAR | Status: DC | PRN
  Administered 2015-04-10 (×2): 25 ug via INTRAVENOUS
  Administered 2015-04-10: 50 ug via INTRAVENOUS

## 2015-04-10 SURGICAL SUPPLY — 19 items
APPLICATOR DR MATTHEWS STRL (MISCELLANEOUS) ×3 IMPLANT
CLOTH BEACON ORANGE TIMEOUT ST (SAFETY) ×3 IMPLANT
CONT SPECI 4OZ STER CLIK (MISCELLANEOUS) ×6 IMPLANT
COVER MAYO STAND STRL (DRAPES) ×3 IMPLANT
GAUZE SPONGE 4X4 16PLY XRAY LF (GAUZE/BANDAGES/DRESSINGS) ×3 IMPLANT
GLOVE BIOGEL PI IND STRL 6.5 (GLOVE) ×1 IMPLANT
GLOVE BIOGEL PI INDICATOR 6.5 (GLOVE) ×2
GLOVE LITE  25/BX (GLOVE) ×6 IMPLANT
GLOVE SURG SIGNA 7.5 PF LTX (GLOVE) ×3 IMPLANT
GLOVE SURG SS PI 6.5 STRL IVOR (GLOVE) ×3 IMPLANT
MARKER SKIN DUAL TIP RULER LAB (MISCELLANEOUS) ×3 IMPLANT
NS IRRIG 500ML POUR BTL (IV SOLUTION) IMPLANT
PACK ICE SM (MISCELLANEOUS) IMPLANT
PAD ALCOHOL SWAB (MISCELLANEOUS) ×3 IMPLANT
SUT VICRYL 8 0 TG140 8 (SUTURE) IMPLANT
SYR 3ML 23GX1 SAFETY (SYRINGE) ×3 IMPLANT
TAPE SURG TRANSPORE 1 IN (GAUZE/BANDAGES/DRESSINGS) ×1 IMPLANT
TAPE SURGICAL TRANSPORE 1 IN (GAUZE/BANDAGES/DRESSINGS) ×2
TOWEL OR 17X24 6PK STRL BLUE (TOWEL DISPOSABLE) ×3 IMPLANT

## 2015-04-10 SURGICAL SUPPLY — 23 items
APPLICATOR DR MATTHEWS STRL (MISCELLANEOUS) ×6 IMPLANT
BANDAGE EYE OVAL (MISCELLANEOUS) ×3 IMPLANT
CAUTERY EYE LOW TEMP 1300F FIN (OPHTHALMIC RELATED) ×3 IMPLANT
CLOSURE WOUND 1/2 X4 (GAUZE/BANDAGES/DRESSINGS) ×1
CORDS BIPOLAR (ELECTRODE) ×3 IMPLANT
COVER BACK TABLE 60X90IN (DRAPES) ×3 IMPLANT
COVER MAYO STAND STRL (DRAPES) ×3 IMPLANT
DRAPE LG THREE QUARTER DISP (DRAPES) ×3 IMPLANT
DRAPE SURG 17X23 STRL (DRAPES) ×9 IMPLANT
GLOVE BIOGEL PI IND STRL 6.5 (GLOVE) ×2 IMPLANT
GLOVE BIOGEL PI IND STRL 7.5 (GLOVE) ×1 IMPLANT
GLOVE BIOGEL PI INDICATOR 6.5 (GLOVE) ×4
GLOVE BIOGEL PI INDICATOR 7.5 (GLOVE) ×2
GLOVE SURG SIGNA 7.5 PF LTX (GLOVE) ×3 IMPLANT
GLOVE SURG SS PI 6.5 STRL IVOR (GLOVE) ×6 IMPLANT
GOWN STRL REUS W/ TWL LRG LVL3 (GOWN DISPOSABLE) ×3 IMPLANT
GOWN STRL REUS W/TWL LRG LVL3 (GOWN DISPOSABLE) ×6
PACK BASIN DAY SURGERY FS (CUSTOM PROCEDURE TRAY) ×3 IMPLANT
STRIP CLOSURE SKIN 1/2X4 (GAUZE/BANDAGES/DRESSINGS) ×2 IMPLANT
SUT VICRYL 6 0 S 29 12 (SUTURE) ×6 IMPLANT
TOWEL OR 17X24 6PK STRL BLUE (TOWEL DISPOSABLE) ×6 IMPLANT
TRAY DSU PREP LF (CUSTOM PROCEDURE TRAY) ×3 IMPLANT
WATER STERILE IRR 500ML POUR (IV SOLUTION) ×3 IMPLANT

## 2015-04-10 NOTE — Transfer of Care (Signed)
Immediate Anesthesia Transfer of Care Note  Patient: Kathy [name]  Procedure(s) Performed: Procedure(s) (LRB): MEDIAN RECTUS RESECTION LATERAL RECTUS RECESSION,AJUSTABLES SUTURES LEFT EYE (Left)  Patient Location: PACU  Anesthesia Type: General  Level of Consciousness: awake, alert  and oriented  Airway & Oxygen Therapy: Patient Spontanous Breathing and Patient connected to nasal cannula oxygen  Post-op Assessment: Report given to PACU RN and Post -op Vital signs reviewed and stable  Post vital signs: Reviewed and stable  Complications: No apparent anesthesia complications  Last Vitals:  Filed Vitals:   04/10/15 0825  BP: 138/72  Pulse: 79  Temp: 36.6 C  Resp: 16

## 2015-04-10 NOTE — H&P (View-Only) (Signed)
NPO AFTER MN.  ARRIVE AT 0830.  NEEDS ISTAT AND EKG.  CURRENT CXR IN CHART AND EPIC. WILL TAKE DETROL AM DOS W/ SIPS OF WATER.

## 2015-04-10 NOTE — Anesthesia Procedure Notes (Signed)
Procedure Name: LMA Insertion Date/Time: 04/10/2015 11:10 AM Performed by: Mechele Claude Pre-anesthesia Checklist: Patient identified, Emergency Drugs available, Suction available and Patient being monitored Patient Re-evaluated:Patient Re-evaluated prior to inductionOxygen Delivery Method: Circle System Utilized Preoxygenation: Pre-oxygenation with 100% oxygen Intubation Type: IV induction Ventilation: Mask ventilation without difficulty LMA: LMA flexible inserted LMA Size: 4.0 Number of attempts: 1 Airway Equipment and Method: bite block Placement Confirmation: positive ETCO2 Tube secured with: Tape Dental Injury: Teeth and Oropharynx as per pre-operative assessment

## 2015-04-10 NOTE — Discharge Instructions (Signed)
Call your surgeon if you experience:   1.  Fever over 101.0. 2.  Inability to urinate. 3.  Nausea and/or vomiting. 4.  Extreme swelling or bruising at the surgical site. 5.  Continued bleeding from the incision. 6.  Increased pain, redness or drainage from the incision. 7.  Problems related to your pain medication. 8. Any problems and/or concerns 9. Any vision changes    Post Anesthesia Home Care Instructions  Activity: Get plenty of rest for the remainder of the day. A responsible adult should stay with you for 24 hours following the procedure.  For the next 24 hours, DO NOT: -Drive a car -Paediatric nurse -Drink alcoholic beverages -Take any medication unless instructed by your physician -Make any legal decisions or sign important papers.  Meals: Start with liquid foods such as gelatin or soup. Progress to regular foods as tolerated. Avoid greasy, spicy, heavy foods. If nausea and/or vomiting occur, drink only clear liquids until the nausea and/or vomiting subsides. Call your physician if vomiting continues.  Special Instructions/Symptoms: Your throat may feel dry or sore from the anesthesia or the breathing tube placed in your throat during surgery. If this causes discomfort, gargle with warm salt water. The discomfort should disappear within 24 hours.  If you had a scopolamine patch placed behind your ear for the management of post- operative nausea and/or vomiting:  1. The medication in the patch is effective for 72 hours, after which it should be removed.  Wrap patch in a tissue and discard in the trash. Wash hands thoroughly with soap and water. 2. You may remove the patch earlier than 72 hours if you experience unpleasant side effects which may include dry mouth, dizziness or visual disturbances. 3. Avoid touching the patch. Wash your hands with soap and water after contact with the patch.

## 2015-04-10 NOTE — Interval H&P Note (Signed)
History and Physical Interval Note:  04/10/2015 10:50 AM  Kathy [name]  has presented today for surgery, with the diagnosis of ESOTROPHIA DOUBLE VISION LEFT EYE  The various methods of treatment have been discussed with the patient and family. After consideration of risks, benefits and other options for treatment, the patient has consented to  Procedure(s): MEDIAN RECTUS RECESSION LATERAL RECTUS RESECTION,AJUSTABLES SUTURES LEFT EYE (Left) as a surgical intervention .  The patient's history has been reviewed, patient examined, no change in status, stable for surgery.  I have reviewed the patient's chart and labs.  Questions were answered to the patient's satisfaction.     Levert Heslop A

## 2015-04-10 NOTE — Interval H&P Note (Signed)
History and Physical Interval Note:  04/10/2015 10:50 AM  Kathy [name]  has presented today for surgery, with the diagnosis of ESOTROPHIA DOUBLE VISION LEFT EYE  The various methods of treatment have been discussed with the patient and family. After consideration of risks, benefits and other options for treatment, the patient has consented to  Procedure(s): MEDIAN RECTUS RECESSION LATERAL RECTUS RESECTION,AJUSTABLES SUTURES LEFT EYE (Left) as a surgical intervention .  The patient's history has been reviewed, patient examined, no change in status, stable for surgery.  I have reviewed the patient's chart and labs.  Questions were answered to the patient's satisfaction.     [name] A

## 2015-04-10 NOTE — Brief Op Note (Signed)
04/10/2015  12:34 PM  PATIENT:  Kathy [name]  64 y.o. female  PRE-OPERATIVE DIAGNOSIS:  ESOTROPHIA DOUBLE VISION LEFT EYE  POST-OPERATIVE DIAGNOSIS:  ESOTROPHIA DOUBLE VISION LEFT EYE  PROCEDURE:  Procedure(s): MEDIAN RECTUS RESECTION LATERAL RECTUS RECESSION,AJUSTABLES SUTURES LEFT EYE (Left)  SURGEON:  Surgeon(s) and Role:    * Gevena Cotton, MD - Primary  PHYSICIAN ASSISTANT:   ASSISTANTS: none   ANESTHESIA:   none  EBL:  Total I/O In: 800 [I.V.:800] Out: -   BLOOD ADMINISTERED:none  DRAINS: none   LOCAL MEDICATIONS USED:  NONE  SPECIMEN:  No Specimen  DISPOSITION OF SPECIMEN:  N/A  COUNTS:  YES  TOURNIQUET:  * No tourniquets in log *  DICTATION: .Other Dictation: Dictation Number [id number]  PLAN OF CARE: Discharge to home after PACU  PATIENT DISPOSITION:  PACU - hemodynamically stable.   Delay start of Pharmacological VTE agent (>24hrs) due to surgical blood loss or risk of bleeding: no

## 2015-04-11 ENCOUNTER — Encounter (HOSPITAL_BASED_OUTPATIENT_CLINIC_OR_DEPARTMENT_OTHER): Payer: Self-pay | Admitting: Ophthalmology

## 2015-04-11 NOTE — Anesthesia Postprocedure Evaluation (Signed)
  Anesthesia Post-op Note  Patient: Kathy [name]  Procedure(s) Performed: Procedure(s) (LRB): MEDIAN RECTUS RESECTION LATERAL RECTUS RECESSION,AJUSTABLES SUTURES LEFT EYE (Left)  Patient Location: PACU  Anesthesia Type: General  Level of Consciousness: awake and alert   Airway and Oxygen Therapy: Patient Spontanous Breathing  Post-op Pain: mild  Post-op Assessment: Post-op Vital signs reviewed, Patient's Cardiovascular Status Stable, Respiratory Function Stable, Patent Airway and No signs of Nausea or vomiting  Last Vitals:  Filed Vitals:   04/10/15 1350  BP: 123/75  Pulse: 81  Temp:   Resp: 18    Post-op Vital Signs: stable   Complications: No apparent anesthesia complications

## 2015-04-11 NOTE — Op Note (Signed)
NAMEMarland Kitchen  MARUA, QIN NO.:  [id number]  MEDICAL RECORD NO.:  [id number]  LOCATION:                               FACILITY:  [facility]  PHYSICIAN:  Venia Carbon. Frederico Hamman, M.D.DATE OF BIRTH:  [birth year]  DATE OF PROCEDURE:  04/10/2015 DATE OF DISCHARGE:  04/10/2015                              OPERATIVE REPORT   PREOPERATIVE DIAGNOSES: 1. Sensory exotropia of the left eye. 2. Macular degeneration, left eye.  POSTOPERATIVE DIAGNOSES:  Status post left medial rectus recession via plication on adjustable suture, left lateral rectus resection of 10 mm.  PROCEDURE:  Left lateral rectus recession of 10 mm, left medial rectus resection via plication of 8 mm on adjustable suture.  SURGEON:  Venia Carbon. Frederico Hamman, M.D.  ANESTHESIA:  General with endotracheal intubation.  INDICATIONS FOR PROCEDURE:  Patient is a 64 year old female with exotropia secondary to sensory visual loss in the left eye.  This procedure is indicated to restore single binocular vision and restore alignment of visual axis.  The risks and benefits of the procedure were explained to the patient prior to the procedure.  Informed consent was obtained.  DESCRIPTION OF TECHNIQUE:  The patient was taken into the operating room, placed in a supine position.  The entire face was prepped and draped in usual sterile fashion,after induction by general anesthesia and establishment of laryngeal mask airway.  My attention was first directed to the left eye. A Lid speculum was placed.  Forced duction tests were performed and found to be negative.  The globe was then held in the inferior temporal quadrant.  An incision was made in the inferior temporal fornix, taken down to the posterior subtenons space and the left lateral rectus tendon was then isolated on a Stevens hook, subsequently on a Green hook. A Second Green hook was then placed beneath the tendon of the muscle and this was used to hold the globe in an elevated  and adducted position.  Next, the tendon was then carefully imbricated on 6-0 Vicryl suture taking 2 locking bites at medial temporal apices and was then dissected free from the globe and recessed exactly 10 mm from its native insertion.  It was reattached to the globe using the pre-placed sutures.The Sutures were tied securely and conjunctiva was repositioned.  My attention was then directed to the ipsilateral medial rectus tendon, the globe was held on the inferior nasal quadrant.  The eye was elevated and abducted.An Incision was made through the inferior nasal fornix, taken down to the posterior subtenons space.  The left medial rectus tendon was then isolated on a Stevens hook subsequently on a Green hook.  A second Green hook was then passed beneath the tendon, this was used to hold the globe and elevated in abducted position.  Next, the tendon was then carefully dissected free from its overlying muscle fascia until muscle septate was transected.  Elta [place] was then placed on the tendon at 8 mm from its native insertion.  The tendon was then imbricated on 6-0 Vicryl suture taking 2 locking bites at medial and temporal apices to the pre-placed marks. The tendon was then advanced to its native insertion site on the pre- placed sutures.  Sutures were then tied in a adjustable suture fashion and the resection was completed via plication of the tendon.  Next, the adjustable suture was reposited under the conjunctiva and a double pressure patch was applied.  The patient was then allowed to awaken from anesthesia for approximately 20 minutes, returned to the operating suite, and the suture was adjusted to straight eyes.  The suture was tied permanently.  TobraDex ointment was applied to the conjunctiva in the fornix of the eye, and the patient was subsequently discharged to home.  There were no apparent complications.     Legrand Como A. Frederico Hamman, M.D.     MAS/MEDQ  D:  04/10/2015  T:   04/11/2015  Job:  [id number]

## 2015-05-30 ENCOUNTER — Other Ambulatory Visit: Payer: Self-pay | Admitting: Family Medicine

## 2015-05-30 NOTE — Telephone Encounter (Signed)
Is this okay?

## 2015-06-07 ENCOUNTER — Other Ambulatory Visit: Payer: Self-pay | Admitting: Family Medicine

## 2015-07-13 ENCOUNTER — Other Ambulatory Visit: Payer: Self-pay | Admitting: Family Medicine

## 2015-09-10 ENCOUNTER — Encounter: Payer: Self-pay | Admitting: Family Medicine

## 2015-09-10 ENCOUNTER — Ambulatory Visit (INDEPENDENT_AMBULATORY_CARE_PROVIDER_SITE_OTHER): Payer: 59 | Admitting: Family Medicine

## 2015-09-10 ENCOUNTER — Ambulatory Visit
Admission: RE | Admit: 2015-09-10 | Discharge: 2015-09-10 | Disposition: A | Discharge status: Home / Self Care | Payer: 59 | Source: Ambulatory Visit | Attending: Family Medicine | Admitting: Family Medicine

## 2015-09-10 VITALS — BP 132/80 | HR 80 | Temp 97.4°F | Ht 62.25 in | Wt 187.0 lb

## 2015-09-10 DIAGNOSIS — Z23 Encounter for immunization: Secondary | ICD-10-CM

## 2015-09-10 DIAGNOSIS — M25551 Pain in right hip: Secondary | ICD-10-CM

## 2015-09-10 MED ORDER — TRAMADOL HCL 50 MG PO TABS: 50.0000 mg | ORAL_TABLET | Freq: Four times a day (QID) | ORAL | Status: DC | PRN

## 2015-09-10 NOTE — Patient Instructions (Signed)
  Go to [facility] for x-ray of the right hip.  We will call with your results later. Try icing the painful area of the hip.  If that doesn't help, then try heat.  Apply for 15-20 minutes at least 3-4 times/day (can use heat more often if helpful). Use the tramadol if needed for severe pain, not controlled by tylenol or your regular mobic/meloxicam. Do not use any extra ibuprofen if taking meloxicam.  Return next week if not improving.

## 2015-09-10 NOTE — Progress Notes (Signed)
Chief Complaint  Patient presents with  . Leg Pain    right leg pain that starts in her right hip and goes all the way down to the ankle-can hardly walk, pain is severe.     She woke up this morning, took a shower, and developed sharp pain at her right hip after getting out of the shower.  She was seated when the pain started. Pain radiates from the right lateral hip into the anterior thigh, all the way down the shin.  Foot is not painful. No calf pain, no swelling.  Pain is sharp, constant. No burning or tingling, no numbness.  Pain started around 7am, pain remains the same.  Describes it as a 10/10  Feels better when reclined with leg straight.  Denies any back pain.  PMH, PSH, SH reviewed.  Outpatient Encounter Prescriptions as of 09/10/2015  Medication Sig Note  . ibuprofen (ADVIL,MOTRIN) 200 MG tablet Take 400 mg by mouth every 6 (six) hours as needed.  09/10/2015: Last dose 7am  . meloxicam (MOBIC) 7.5 MG tablet Take 1 tablet by mouth  daily   . Multiple Vitamins-Minerals (PRESERVISION/LUTEIN) CAPS Take by mouth.   . tolterodine (DETROL LA) 2 MG 24 hr capsule Take 2 capsules by mouth  every day   . TRIBENZOR 40-10-12.5 MG TABS Take 1 tablet by mouth  every day   . fluorometholone (FML) 0.1 % ophthalmic suspension INSTILL 1 DROP INTO LEFT EYE TWICE A DAY 09/10/2015: Received from: External Pharmacy  . traMADol (ULTRAM) 50 MG tablet Take 1 tablet (50 mg total) by mouth every 6 (six) hours as needed for severe pain.   . [DISCONTINUED] acetaminophen-codeine (TYLENOL #2) 300-15 MG per tablet Take 1 tablet by mouth every 4 (four) hours as needed for moderate pain.   . [DISCONTINUED] albuterol (PROVENTIL HFA;VENTOLIN HFA) 108 (90 BASE) MCG/ACT inhaler Inhale 2 puffs into the lungs every 6 (six) hours as needed for wheezing.   . [DISCONTINUED] tobramycin-dexamethasone (TOBRADEX) ophthalmic ointment Place 1 application into the left eye 2 (two) times daily at 10 am and 4 pm.    No  facility-administered encounter medications on file as of 09/10/2015.   (tramadol rx'd today, not prior to visit)  Allergies  Allergen Reactions  . Lortab [Hydrocodone-Acetaminophen] Nausea And Vomiting   ROS:  No fever, chills, URI symptoms, chest pain, shortness of breath, back pain, numbness, tingling, edema, bleeding, bruising, rash or other complaints.  PHYSICAL EXAM: BP 132/80 mmHg  Pulse 80  Temp(Src) 97.4 F (36.3 C) (Tympanic)  Ht 5' 2.25" (1.581 m)  Wt 187 lb (84.823 kg)  BMI 33.94 kg/m2  She appears comfortable while sitting in wheelchair, but holding her leg extended (because it felt better that way). She was able to put her foot on the footrest, and still didn't appear too uncomfortable.  She had some distress and pain in getting up on exam table.  She had been in unbearable pain prior to getting back to exam room. Hip and leg are in normal alignment (no rotation or shortening).  Visually appears normal--no bruising, rash, swelling. Tender at right ASIS and along ilium. Pain with hip flexion--unable to do actively, had pain with passive hip flexion. Normal strength otherwise, normal sensation No pain with external rotation of hip. nontender at trochanteric bursa 2+ pulses, no edema  ASSESSMENT/PLAN:  Acute right hip pain - Plan: DG HIP UNILAT W OR W/O PELVIS 2-3 VIEWS RIGHT, traMADol (ULTRAM) 50 MG tablet  Need for prophylactic vaccination and inoculation against  influenza - Plan: Flu Vaccine QUAD 36+ mos PF IM (Fluarix & Fluzone Quad PF)   Go to [facility] for x-ray of the right hip.  We will call with your results later. Try icing the painful area of the hip.  If that doesn't help, then try heat.  Apply for 15-20 minutes at least 3-4 times/day (can use heat more often if helpful). Use the tramadol if needed for severe pain, not controlled by tylenol or your regular mobic/meloxicam. Do not use any extra ibuprofen if taking meloxicam.  Return next week if  not improving.  Addendum--x-ray negative for acute finding.  ?hip flexor strain? No avulsion fracture noted on x-ray

## 2015-09-11 ENCOUNTER — Encounter: Payer: Self-pay | Admitting: Family Medicine

## 2015-09-16 ENCOUNTER — Ambulatory Visit (INDEPENDENT_AMBULATORY_CARE_PROVIDER_SITE_OTHER): Payer: 59 | Admitting: Family Medicine

## 2015-09-16 ENCOUNTER — Encounter: Payer: Self-pay | Admitting: Family Medicine

## 2015-09-16 VITALS — BP 128/78 | HR 94 | Resp 14 | Ht 63.0 in | Wt 183.4 lb

## 2015-09-16 DIAGNOSIS — M25551 Pain in right hip: Secondary | ICD-10-CM | POA: Diagnosis not present

## 2015-09-16 DIAGNOSIS — S76011A Strain of muscle, fascia and tendon of right hip, initial encounter: Secondary | ICD-10-CM

## 2015-09-16 MED ORDER — TRAMADOL HCL 50 MG PO TABS: 50.0000 mg | ORAL_TABLET | Freq: Four times a day (QID) | ORAL | Status: DC | PRN

## 2015-09-16 NOTE — Patient Instructions (Signed)
   Continue to avoid going to the gym. Limit activities which cause pain (ie walking). Apply heat at least 3-4 times daily to the inner thigh area where you are having discomfort.   After the heat, do some gentle stretches and massage. You can try a topical medication such as Biofreeze or [employer]. Continue the meloxicam once daily. Use the Extra Strength Tylenol during the day if needed for pain. You may use the tramadol only if needed for severe pain (ie in the evenings, when pain is worse)  Follow up in 1-2 weeks if you aren't getting complete resolution of your discomfort. Listen to your body--avoid activities which make the pain worse. If not improving, we may need to refer to Physical Therapy vs Sports Med doctor (probably PT first).

## 2015-09-16 NOTE — Progress Notes (Signed)
Chief Complaint  Patient presents with  . Hip pain    pain goes down thigh. 5/10 during the day, 8/10 at night. going on since last Tuesday. Goes to the gym Monday-Friday for 3 hours and thinks the pain may be caused by over doing it in the gym.   She presents for follow up on the right hip pain.  Overall she feels she has improved some, but the pain gets worse each evening.  She hasn't gone to the gym since her last visit.  She tried both ice and heat, heat was effective (temporarily).    Pain has shifted some--she is now having pain more laterally, in the right lateral hip, which radiates around to the inner and anterior thigh. She is no longer having the bony tenderness that she had last time (at ASIS).  She still denies any back pain and urinary symptoms.  She denies numbness, tingling or weakness in the leg.   PMH, [place] SH reviewed. Outpatient Encounter Prescriptions as of 09/16/2015  Medication Sig Note  . acetaminophen (TYLENOL) 500 MG tablet Take 500 mg by mouth every 6 (six) hours as needed. 09/16/2015: Took 1 last night and 2 this morning  . meloxicam (MOBIC) 7.5 MG tablet Take 1 tablet by mouth  daily   . Multiple Vitamins-Minerals (PRESERVISION/LUTEIN) CAPS Take by mouth.   . tolterodine (DETROL LA) 2 MG 24 hr capsule Take 2 capsules by mouth  every day   . TRIBENZOR 40-10-12.5 MG TABS Take 1 tablet by mouth  every day   . traMADol (ULTRAM) 50 MG tablet Take 1 tablet (50 mg total) by mouth every 6 (six) hours as needed for severe pain. (Patient not taking: Reported on 09/16/2015) 09/16/2015: Finished bottle  . [DISCONTINUED] fluorometholone (FML) 0.1 % ophthalmic suspension INSTILL 1 DROP INTO LEFT EYE TWICE A DAY 09/10/2015: Received from: External Pharmacy  . [DISCONTINUED] ibuprofen (ADVIL,MOTRIN) 200 MG tablet Take 400 mg by mouth every 6 (six) hours as needed.  09/10/2015: Last dose 7am   No facility-administered encounter medications on file as of 09/16/2015.   Allergies   Allergen Reactions  . Lortab [Hydrocodone-Acetaminophen] Nausea And Vomiting   ROS:  No fever, chills, urinary complaints, back pain, bleeding, bruising, rash. No URI symptoms, chest pain, shortness of breath, abdominal pain or other GI complaints.  See HPI  PHYSICAL EXAM: BP 128/78 mmHg  Pulse 94  Resp 14  Ht 5\' 3"  (1.6 m)  Wt 183 lb 6.4 oz (83.19 kg)  BMI 32.50 kg/m2  Well developed, pleasant female in some discomfort with position changes, but otherwise appeared comfortable at rest  Area of discomfort is over her right upper buttock and lateral hip.  nontender to palpation over this area. Nontender over trochanteric bursa.  No pain with internal or external rotation of the hip, just some pulling/discomfort medial thigh. She is tender along the anterior and medial right thigh (at adductors).  No palpable mass, spasm, cords or other palpable abnormality.  She has some medial thigh discomfort with both adduction and abduction against resistance.  Normal strength. Spine is nontender, nontender at SI joints. DTR's are 1+ and symmetric. Extremities: no edema Skin: without rash or lesions  ASSESSMENT/PLAN:  Acute right hip pain - Plan: traMADol (ULTRAM) 50 MG tablet  Strain of hip adductor muscle, right, initial encounter   Continue to avoid going to the gym. Limit activities which cause pain (ie walking). Apply heat at least 3-4 times daily to the inner thigh area where you are  having discomfort.   After the heat, do some gentle stretches and massage. You can try a topical medication such as Biofreeze or [employer]. Continue the meloxicam once daily. Use the Extra Strength Tylenol during the day if needed for pain. You may use the tramadol only if needed for severe pain (ie in the evenings, when pain is worse)  Follow up in 1-2 weeks if you aren't getting complete resolution of your discomfort. Listen to your body--avoid activities which make the pain worse. If not improving, we  may need to refer to Physical Therapy vs Sports Med doctor (probably PT first).

## 2015-09-28 ENCOUNTER — Other Ambulatory Visit: Payer: Self-pay | Admitting: Family Medicine

## 2015-09-30 NOTE — Telephone Encounter (Signed)
Is this okay?

## 2016-01-27 LAB — HM MAMMOGRAPHY: HM MAMMO: NORMAL (ref 0–4)

## 2016-02-03 ENCOUNTER — Ambulatory Visit (INDEPENDENT_AMBULATORY_CARE_PROVIDER_SITE_OTHER): Payer: Commercial Managed Care - HMO | Admitting: Family Medicine

## 2016-02-03 ENCOUNTER — Encounter: Payer: Self-pay | Admitting: Family Medicine

## 2016-02-03 VITALS — BP 140/90 | HR 93 | Ht 63.0 in | Wt 193.1 lb

## 2016-02-03 DIAGNOSIS — Z1159 Encounter for screening for other viral diseases: Secondary | ICD-10-CM

## 2016-02-03 DIAGNOSIS — H5010 Unspecified exotropia: Secondary | ICD-10-CM | POA: Diagnosis not present

## 2016-02-03 DIAGNOSIS — Z0000 Encounter for general adult medical examination without abnormal findings: Secondary | ICD-10-CM | POA: Diagnosis not present

## 2016-02-03 DIAGNOSIS — I10 Essential (primary) hypertension: Secondary | ICD-10-CM

## 2016-02-03 DIAGNOSIS — D869 Sarcoidosis, unspecified: Secondary | ICD-10-CM

## 2016-02-03 DIAGNOSIS — J301 Allergic rhinitis due to pollen: Secondary | ICD-10-CM | POA: Diagnosis not present

## 2016-02-03 DIAGNOSIS — Z23 Encounter for immunization: Secondary | ICD-10-CM

## 2016-02-03 DIAGNOSIS — H3530 Unspecified macular degeneration: Secondary | ICD-10-CM

## 2016-02-03 DIAGNOSIS — N318 Other neuromuscular dysfunction of bladder: Secondary | ICD-10-CM

## 2016-02-03 DIAGNOSIS — M797 Fibromyalgia: Secondary | ICD-10-CM | POA: Diagnosis not present

## 2016-02-03 DIAGNOSIS — B351 Tinea unguium: Secondary | ICD-10-CM

## 2016-02-03 LAB — CBC WITH DIFFERENTIAL/PLATELET
BASOS PCT: 1 % (ref 0–1)
Basophils Absolute: 0.1 10*3/uL (ref 0.0–0.1)
Eosinophils Absolute: 0.2 10*3/uL (ref 0.0–0.7)
Eosinophils Relative: 3 % (ref 0–5)
HCT: 43.6 % (ref 36.0–46.0)
Hemoglobin: 15.1 g/dL — ABNORMAL HIGH (ref 12.0–15.0)
Lymphocytes Relative: 41 % (ref 12–46)
Lymphs Abs: 2.1 10*3/uL (ref 0.7–4.0)
MCH: 29.5 pg (ref 26.0–34.0)
MCHC: 34.6 g/dL (ref 30.0–36.0)
MCV: 85.3 fL (ref 78.0–100.0)
MONO ABS: 0.5 10*3/uL (ref 0.1–1.0)
MONOS PCT: 9 % (ref 3–12)
MPV: 9.3 fL (ref 8.6–12.4)
NEUTROS ABS: 2.4 10*3/uL (ref 1.7–7.7)
NEUTROS PCT: 46 % (ref 43–77)
Platelets: 256 10*3/uL (ref 150–400)
RBC: 5.11 MIL/uL (ref 3.87–5.11)
RDW: 13.3 % (ref 11.5–15.5)
WBC: 5.2 10*3/uL (ref 4.0–10.5)

## 2016-02-03 LAB — COMPREHENSIVE METABOLIC PANEL
ALT: 13 U/L (ref 6–29)
AST: 24 U/L (ref 10–35)
Albumin: 4.6 g/dL (ref 3.6–5.1)
Alkaline Phosphatase: 88 U/L (ref 33–130)
BUN: 16 mg/dL (ref 7–25)
CHLORIDE: 101 mmol/L (ref 98–110)
CO2: 29 mmol/L (ref 20–31)
Calcium: 9.5 mg/dL (ref 8.6–10.4)
Creat: 0.69 mg/dL (ref 0.50–0.99)
Glucose, Bld: 96 mg/dL (ref 65–99)
Potassium: 3.9 mmol/L (ref 3.5–5.3)
SODIUM: 142 mmol/L (ref 135–146)
Total Bilirubin: 0.5 mg/dL (ref 0.2–1.2)
Total Protein: 7.4 g/dL (ref 6.1–8.1)

## 2016-02-03 LAB — LIPID PANEL
CHOL/HDL RATIO: 3.5 ratio (ref <=5.0)
Cholesterol: 265 mg/dL — ABNORMAL HIGH (ref 125–200)
HDL: 75 mg/dL (ref >=46)
LDL Cholesterol: 162 mg/dL — ABNORMAL HIGH (ref <130)
Triglycerides: 139 mg/dL (ref <150)
VLDL: 28 mg/dL (ref <30)

## 2016-02-03 MED ORDER — MELOXICAM 7.5 MG PO TABS
ORAL_TABLET | ORAL | Status: DC
Start: 2016-02-03 — End: 2016-11-19

## 2016-02-03 MED ORDER — TERBINAFINE HCL 250 MG PO TABS: 250.0000 mg | ORAL_TABLET | Freq: Every day | ORAL | Status: DC

## 2016-02-03 MED ORDER — TOLTERODINE TARTRATE ER 2 MG PO CP24: ORAL_CAPSULE | ORAL | Status: DC

## 2016-02-03 MED ORDER — OLMESARTAN-AMLODIPINE-HCTZ 40-10-12.5 MG PO TABS: ORAL_TABLET | ORAL | Status: DC

## 2016-02-03 NOTE — Progress Notes (Signed)
Subjective:    Patient ID: Kathy [name], female    DOB: Apr 14, 1951, 65 y.o.   MRN: [id number]  HPI She is here for complete examination. She does have underlying hypertension and continues on generic equivalent of Tribenzor. She also has OAB and is doing well on Detrol. She has a history of fibromyalgia and does use Mobic as well as Tylenol with good results. She has remote history of sarcoidosis but has had no cough, congestion, shortness of breath. Her last x-ray in 2015 showed no changes from the previous onedone in 2013. She did have eye surgery which did partially correct her esotropia. But she still has difficulty with vision out of that eye. She does have underlying macular degeneration. She does complain ofdiscoloration and discomfort with thickening of the right great and second toe. She also complains of springtime allergies with sneezing, itchy watery eyes and rhinorrhea. She is now retired. Her marriage is going well. Family and social history as well as health maintenance and immunizations were reviewed.she has had a hysterectomy. [facility] [employer] [facility] her [facility] is going quite well.  Review of Systems  All other systems reviewed and are negative.      Objective:   Physical Exam BP 140/90 mmHg  Pulse 93  Ht 5\' 3"  (1.6 m)  Wt 193 lb 2 oz (87.601 kg)  BMI 34.22 kg/m2  SpO2 99%  General Appearance:    Alert, cooperative, no distress, appears stated age  Head:    Normocephalic, without obvious abnormality, atraumatic  Eyes:    PERRL, conjunctiva/corneas clear, EOM's intact, fundi    benign  Ears:    Normal TM's and external ear canals  Nose:   Nares normal, mucosa normal, no drainage or sinus   tenderness  Throat:   Lips, mucosa, and tongue normal; teeth and gums normal  Neck:   Supple, no lymphadenopathy;  thyroid:  no   enlargement/tenderness/nodules; no carotid   bruit or JVD  Back:    Spine nontender, no curvature, ROM normal, no CVA      tenderness  Lungs:     Clear to auscultation bilaterally without wheezes, rales or     ronchi; respirations unlabored  Chest Wall:    No tenderness or deformity   Heart:    Regular rate and rhythm, S1 and S2 normal, no murmur, rub   or gallop  Breast Exam:    Deferred to GYN  Abdomen:     Soft, non-tender, nondistended, normoactive bowel sounds,    no masses, no hepatosplenomegaly  Genitalia:    Deferred to GYN     Extremities:   No clubbing, cyanosis or edema thickening of the right great and second toe nails is noted.  Pulses:   2+ and symmetric all extremities  Skin:   Skin color, texture, turgor normal, no rashes or lesions  Lymph nodes:   Cervical, supraclavicular, and axillary nodes normal  Neurologic:   CNII-XII intact, normal strength, sensation and gait; reflexes 2+ and symmetric throughout          Psych:   Normal mood, affect, hygiene and grooming.          Assessment & Plan:  Routine general medical examination at a health care facility - Plan: CBC with Differential/Platelet, Comprehensive metabolic panel, Lipid panel  Fibromyalgia - Plan: CBC with Differential/Platelet, Hepatitis C antibody, tolterodine (DETROL LA) 2 MG 24 hr capsule  OVERACTIVE BLADDER - Plan: meloxicam (MOBIC) 7.5 MG tablet  Essential hypertension - Plan: CBC with Differential/Platelet, Comprehensive metabolic panel, Lipid panel, Olmesartan-Amlodipine-HCTZ (TRIBENZOR) 40-10-12.5 MG TABS  Exotropia  Macular degeneration  Need for prophylactic vaccination against Streptococcus pneumoniae (pneumococcus) - Plan: Pneumococcal polysaccharide vaccine 23-valent greater than or equal to 2yo subcutaneous/IM  Need for prophylactic vaccination with combined diphtheria-tetanus-pertussis (DTP) vaccine - Plan: Tdap vaccine greater than or equal to 7yo IM  Onychomycosis - Plan: terbinafine (LAMISIL) 250 MG tablet  Need for hepatitis C screening test - Plan: Hepatitis C antibody  SARCOIDOSIS  Allergic  rhinitis due to pollen She will continue on her present medication regimen. Recommend Flonase to help with her allergy symptoms. Encouraged her to continue with her active lifestyle.return here in 3 months for recheck on her onychomycosis

## 2016-02-04 LAB — HEPATITIS C ANTIBODY: HCV Ab: NEGATIVE

## 2016-04-24 ENCOUNTER — Ambulatory Visit
Admission: RE | Admit: 2016-04-24 | Discharge: 2016-04-24 | Disposition: A | Discharge status: Home / Self Care | Payer: 59 | Source: Ambulatory Visit | Attending: Medical | Admitting: Medical

## 2016-04-24 ENCOUNTER — Ambulatory Visit (INDEPENDENT_AMBULATORY_CARE_PROVIDER_SITE_OTHER): Payer: Commercial Managed Care - HMO | Admitting: Medical

## 2016-04-24 ENCOUNTER — Telehealth: Payer: Self-pay | Admitting: Medical

## 2016-04-24 ENCOUNTER — Encounter: Payer: Self-pay | Admitting: Medical

## 2016-04-24 VITALS — BP 120/84 | HR 89 | Temp 98.3°F | Resp 20 | Wt 187.0 lb

## 2016-04-24 DIAGNOSIS — Z862 Personal history of diseases of the blood and blood-forming organs and certain disorders involving the immune mechanism: Secondary | ICD-10-CM | POA: Diagnosis not present

## 2016-04-24 DIAGNOSIS — R062 Wheezing: Secondary | ICD-10-CM | POA: Diagnosis not present

## 2016-04-24 DIAGNOSIS — R05 Cough: Secondary | ICD-10-CM | POA: Diagnosis not present

## 2016-04-24 DIAGNOSIS — R059 Cough, unspecified: Secondary | ICD-10-CM

## 2016-04-24 DIAGNOSIS — J209 Acute bronchitis, unspecified: Secondary | ICD-10-CM

## 2016-04-24 MED ORDER — AZITHROMYCIN 250 MG PO TABS: ORAL_TABLET | ORAL | Status: DC

## 2016-04-24 MED ORDER — ALBUTEROL SULFATE HFA 108 (90 BASE) MCG/ACT IN AERS: 2.0000 | INHALATION_SPRAY | Freq: Four times a day (QID) | RESPIRATORY_TRACT | Status: DC | PRN

## 2016-04-24 MED ORDER — ALBUTEROL SULFATE (2.5 MG/3ML) 0.083% IN NEBU
2.5000 mg | INHALATION_SOLUTION | Freq: Once | RESPIRATORY_TRACT | Status: AC
  Administered 2016-04-24: 2.5 mg via RESPIRATORY_TRACT

## 2016-04-24 MED ORDER — PREDNISONE 10 MG PO TABS: ORAL_TABLET | ORAL | Status: DC

## 2016-04-24 NOTE — Progress Notes (Signed)
Subjective:  Kathy [name] is a 65 y.o. female who presents for possible bronchitis.  Has cough, wheezing, congestion.  Has had some sore throat.  No ear pain, no fever, no NVD. Using robitussin OTC.   No sick contacts.   Nonsmoker.  Hx/o sarcoidosis but no recent flares, in remission.   No pulmonologist recently.  Denies frequent illness.  has been on inhalers in the past. No other aggravating or relieving factors.  No other c/o.  The following portions of the patient's history were reviewed and updated as appropriate: allergies, current medications, past family history, past medical history, past social history, past surgical history and problem list.  ROS as in subjective  Past Medical History  Diagnosis Date  . Hypertension   . Sarcoidosis ([place])   . Overactive bladder   . Macular degeneration   . Fibromyalgia   . Exotropia of left eye   . History of uterine leiomyoma   . OA (osteoarthritis)   . Wears glasses      Objective: BP 120/84 mmHg  Pulse 89  Temp(Src) 98.3 F (36.8 C) (Tympanic)  Resp 20  Wt 187 lb (84.823 kg)  SpO2 98%  General appearance: Alert, WD/WN, no distress                             Skin: warm, no rash, no diaphoresis                           Head: no sinus tenderness                            Eyes: conjunctiva normal, corneas clear, PERRLA                            Ears: pearly TMs, external ear canals normal                          Nose: septum midline, turbinates swollen, with erythema and clear discharge             Mouth/throat: MMM, tongue normal, mild pharyngeal erythema                           Neck: supple, no adenopathy, no thyromegaly, non tender                          Heart: RRR, normal S1, S2, no murmurs                         Lungs: +bronchial breath sounds, +scattered rhonchi, few scattered wheezes, no rales                Extremities: no edema, non tender     Assessment: Encounter Diagnoses  Name Primary?  . Acute  bronchitis, unspecified organism Yes  . Wheezing   . Cough   . History of sarcoidosis      Plan:  After discussing symptoms, she request breathing treatment.  Gave 1 round of albuterol by nebulized.  She didn't seem to get a lot of improvement but not sure our nebulizer was working properly either.    Will send for chest xray.   Discussed diagnosis and  treatment of bronchitis.  Suggested symptomatic OTC remedies for cough and congestion.   Patient Instructions   Encounter Diagnoses  Name Primary?  . Acute bronchitis, unspecified organism Yes  . Wheezing   . Cough   . History of sarcoidosis    Recommendations  Rest  Hydrate well  Begin Albuterol inhaler,2 puffs every 4 hours as needed  Begin Prednisone taper, oral medication as directed on label  Begin zpak antibiotic  I will call with xray results  If worse over the weekend, difficulty breathing, then go to the emergency dept.  f/u pending xray

## 2016-04-24 NOTE — Patient Instructions (Signed)
Encounter Diagnoses  Name Primary?  . Acute bronchitis, unspecified organism Yes  . Wheezing   . Cough   . History of sarcoidosis    Recommendations  Rest  Hydrate well  Begin Albuterol inhaler,2 puffs every 4 hours as needed  Begin Prednisone taper, oral medication as directed on label  Begin zpak antibiotic  I will call with xray results  If worse over the weekend, difficulty breathing, then go to the emergency dept.

## 2016-04-24 NOTE — Telephone Encounter (Signed)
Called pt & informed X-ray normal.  Advised Kathy [name] sent in 3 Rx to pharmacy but no need for antibiotic, only start the Prednisone and the albuterol.  Pt agreed.

## 2016-05-05 ENCOUNTER — Encounter: Payer: Self-pay | Admitting: Family Medicine

## 2016-05-05 ENCOUNTER — Ambulatory Visit (INDEPENDENT_AMBULATORY_CARE_PROVIDER_SITE_OTHER): Payer: Commercial Managed Care - HMO | Admitting: Family Medicine

## 2016-05-05 VITALS — BP 120/80 | HR 79 | Wt 189.0 lb

## 2016-05-05 DIAGNOSIS — H6123 Impacted cerumen, bilateral: Secondary | ICD-10-CM

## 2016-05-05 DIAGNOSIS — J209 Acute bronchitis, unspecified: Secondary | ICD-10-CM

## 2016-05-05 DIAGNOSIS — B351 Tinea unguium: Secondary | ICD-10-CM | POA: Diagnosis not present

## 2016-05-05 NOTE — Progress Notes (Signed)
   Subjective:    Patient ID: Kathy [name], female    DOB: 05/08/1951, 65 y.o.   MRN: [id number]  HPI She is here for recheck. She states that she is still coughing but much less so than in the past. She also has been on Lamisil for the last 3 months for her toenails. She also complains of difficulty with hearing.   Review of Systems     Objective:   Physical Exam End in no distress. Lungs are clear to auscultation. Cardiac exam shows regular rhythm without murmurs or gallops. Cerumen was noted in both canals and removed via lavage without difficulty. Canals and TMs are normal. Exam of her feet shows no changes with thickening of the nails.       Assessment & Plan:  Cerumen impaction, bilateral  Onychomycosis  Acute bronchitis, unspecified organism  Recommend that she stop the Lamisil since it doesn't seem to be working. She will continue to use her inhaler and hopefully will need this less and less. No particular therapy needed for the cerumen.

## 2016-07-22 ENCOUNTER — Telehealth: Payer: Self-pay | Admitting: Family Medicine

## 2016-07-22 NOTE — Telephone Encounter (Signed)
Make the referral 

## 2016-07-22 NOTE — Telephone Encounter (Signed)
Pt states toe that you saw her for is now painful and toenail trying to come off and it's ingrown and she wants to go to a Foot doctor, she doesn't know if she needs a referral or not but would like appt ASAP due to pain

## 2016-07-23 ENCOUNTER — Other Ambulatory Visit: Payer: Self-pay

## 2016-07-23 DIAGNOSIS — M25579 Pain in unspecified ankle and joints of unspecified foot: Secondary | ICD-10-CM

## 2016-07-23 NOTE — Telephone Encounter (Signed)
I have put referral in epic 

## 2016-08-14 ENCOUNTER — Ambulatory Visit (INDEPENDENT_AMBULATORY_CARE_PROVIDER_SITE_OTHER): Payer: Commercial Managed Care - HMO | Admitting: Podiatry

## 2016-08-14 VITALS — BP 131/76 | HR 85 | Resp 16 | Ht 62.0 in | Wt 185.0 lb

## 2016-08-14 DIAGNOSIS — M79674 Pain in right toe(s): Secondary | ICD-10-CM | POA: Diagnosis not present

## 2016-08-14 DIAGNOSIS — L600 Ingrowing nail: Secondary | ICD-10-CM | POA: Diagnosis not present

## 2016-08-14 DIAGNOSIS — B351 Tinea unguium: Secondary | ICD-10-CM

## 2016-08-14 DIAGNOSIS — M79675 Pain in left toe(s): Secondary | ICD-10-CM | POA: Diagnosis not present

## 2016-08-14 NOTE — Progress Notes (Signed)
   Subjective:    Patient ID: Kathy [name], female    DOB: June 14, 1951, 65 y.o.   MRN: [id number]  HPI 65 year old female presents the office today for concerns of ingrown tonsil right big toe as well as elongated, thick tenderness that she cannot trim herself. She denies any redness or drainage or any swelling. Denies any signs or symptoms of infection. No other complaints at this time.   Review of Systems  All other systems reviewed and are negative.      Objective:   Physical Exam General: AAO x3, NAD  Dermatological: Right hallux toenail with mild incurvation distally with tenderness along the incurvation of the nail distally. There is no edema, erythema or any signs or symptoms of infection. The remaining toes also dystrophic, discolored, elongated there is tenderness to nails 1-5 bilaterally. No redness or drainage. No open lesions or pre-ulcerative lesions.  Vascular: Dorsalis Pedis artery and Posterior Tibial artery pedal pulses are 2/4 bilateral with immedate capillary fill time. There is no pain with calf compression, swelling, warmth, erythema.   Neruologic: Grossly intact via light touch bilateral. Vibratory intact via tuning fork bilateral. Protective threshold with Semmes Wienstein monofilament intact to all pedal sites bilateral.   Musculoskeletal: No gross boney pedal deformities bilateral. No pain, crepitus, or limitation noted with foot and ankle range of motion bilateral. Muscular strength 5/5 in all groups tested bilateral.  Gait: Unassisted, Nonantalgic.     Assessment & Plan:  65 year old female with ingrown toenail right hallux, symptomatic onychomycosis -Treatment options discussed including all alternatives, risks, and complications -Etiology of symptoms were discussed -Nails debrided 10 without complications or bleeding. -Discussed the symptoms continue possible partial nail avulsion. She wishes to hold off today. -Daily foot inspection -Follow-up in 3  months or sooner if any problems arise. In the meantime, encouraged to call the office with any questions, concerns, change in symptoms.   Celesta Gentile, DPM

## 2016-08-19 ENCOUNTER — Other Ambulatory Visit: Payer: Self-pay

## 2016-08-19 MED ORDER — NONFORMULARY OR COMPOUNDED ITEM: 1.0000 g | Freq: Every day | 3 refills | Status: DC

## 2016-09-22 ENCOUNTER — Telehealth: Payer: Self-pay | Admitting: Family Medicine

## 2016-09-22 NOTE — Telephone Encounter (Signed)
Set this up 

## 2016-09-22 NOTE — Telephone Encounter (Signed)
Pt is will be ready to e set up for colonoscopy sometime after Nov 1st when all her insurance is effective and she would like to go to Dr Collene Mares.

## 2016-09-22 NOTE — Telephone Encounter (Signed)
I have faxed referral to Dr.Mann office

## 2016-09-24 ENCOUNTER — Other Ambulatory Visit: Payer: Commercial Managed Care - HMO

## 2016-09-30 ENCOUNTER — Telehealth: Payer: Self-pay | Admitting: Family Medicine

## 2016-09-30 ENCOUNTER — Other Ambulatory Visit (INDEPENDENT_AMBULATORY_CARE_PROVIDER_SITE_OTHER): Payer: Commercial Managed Care - HMO

## 2016-09-30 DIAGNOSIS — Z23 Encounter for immunization: Secondary | ICD-10-CM | POA: Diagnosis not present

## 2016-09-30 NOTE — Telephone Encounter (Signed)
Pt requesting that the mg's on her bladder medication (she does not know the name of the medication) be increased. She said the medication is not helping as well now. She did not go in to details since she was in the office at the window.

## 2016-10-01 NOTE — Telephone Encounter (Signed)
Pt has appointment 

## 2016-10-01 NOTE — Telephone Encounter (Signed)
She is on the maximum dosing of that medication. Have her commence we can discuss further options.

## 2016-10-02 ENCOUNTER — Telehealth: Payer: Self-pay | Admitting: Family Medicine

## 2016-10-02 NOTE — Telephone Encounter (Signed)
Pt called and needs a referral to go to dr Collene Mares office for he colonoscopy due to there insurance, pt has [place] and medicaid. Pt can be reached at [phone]

## 2016-10-05 ENCOUNTER — Telehealth: Payer: Self-pay | Admitting: Family Medicine

## 2016-10-05 ENCOUNTER — Ambulatory Visit (INDEPENDENT_AMBULATORY_CARE_PROVIDER_SITE_OTHER): Payer: Commercial Managed Care - HMO | Admitting: Family Medicine

## 2016-10-05 VITALS — BP 130/80 | HR 76 | Resp 16 | Wt 191.6 lb

## 2016-10-05 DIAGNOSIS — Z1211 Encounter for screening for malignant neoplasm of colon: Secondary | ICD-10-CM

## 2016-10-05 DIAGNOSIS — R350 Frequency of micturition: Secondary | ICD-10-CM | POA: Diagnosis not present

## 2016-10-05 DIAGNOSIS — N318 Other neuromuscular dysfunction of bladder: Secondary | ICD-10-CM

## 2016-10-05 DIAGNOSIS — B9789 Other viral agents as the cause of diseases classified elsewhere: Secondary | ICD-10-CM | POA: Diagnosis not present

## 2016-10-05 DIAGNOSIS — J069 Acute upper respiratory infection, unspecified: Secondary | ICD-10-CM | POA: Diagnosis not present

## 2016-10-05 LAB — POCT URINALYSIS DIPSTICK
Bilirubin, UA: NEGATIVE
Blood, UA: NEGATIVE
Glucose, UA: NEGATIVE
KETONES UA: NEGATIVE
Leukocytes, UA: NEGATIVE
Nitrite, UA: NEGATIVE
PH UA: 6
Protein, UA: NEGATIVE
SPEC GRAV UA: 1.01
UROBILINOGEN UA: NEGATIVE

## 2016-10-05 MED ORDER — ALBUTEROL SULFATE HFA 108 (90 BASE) MCG/ACT IN AERS: 2.0000 | INHALATION_SPRAY | Freq: Four times a day (QID) | RESPIRATORY_TRACT | 0 refills | Status: DC | PRN

## 2016-10-05 MED ORDER — DARIFENACIN HYDROBROMIDE ER 15 MG PO TB24: 15.0000 mg | ORAL_TABLET | Freq: Every day | ORAL | 3 refills | Status: DC

## 2016-10-05 NOTE — Telephone Encounter (Signed)
thats okay go ahead and set this up

## 2016-10-05 NOTE — Telephone Encounter (Signed)
Pt called and wants to cancel cologuard and see Dr. Collene Mares for a colonoscopy.  Please refer

## 2016-10-05 NOTE — Patient Instructions (Signed)
Use the inhaler as needed for the cough and wheezing. You can also take Robitussin-DM during the day and NyQuil at night if you need to

## 2016-10-05 NOTE — Progress Notes (Signed)
   Subjective:    Patient ID: Kathy [name], female    DOB: [birth year], 65 y.o.   MRN: [id number]  HPI She is here for multiple issues. She has been on Detrol LA for treatment of OAB mainly because her previous insurance would not allow her to get Enablex. She is now on Medicare and would like to be switched back to that medicine as it seems to be working better. She also complains of a started with cough, intermittent wheezing, slight sore throat with chills but no shortness of breath ,earache ,rhinorrhea or PND. She also has not had a colonoscopy in 10 years.  Review of Systems     Objective:   Physical Exam Alert and in no distress. Tympanic membranes and canals are normal. Pharyngeal area is normal. Neck is supple without adenopathy or thyromegaly. Cardiac exam shows a regular sinus rhythm without murmurs or gallops. Lungs are clear to auscultation.Kathy [name] dipstick was negative.      Assessment & Plan:  Urinary frequency - Plan: Urinalysis Dipstick  OVERACTIVE BLADDER - Plan: darifenacin (ENABLEX) 15 MG 24 hr tablet  Viral URI with cough - Plan: albuterol (PROVENTIL HFA;VENTOLIN HFA) 108 (90 Base) MCG/ACT inhaler  Screening for colon cancer - Plan: Cologuard Discussed options concerning colon cancer screening and will order the Cologuard. I will also place her on Proventil to help with the cough and wheezing. She will call if she has difficulty with that and also any further difficulty with Enablex.

## 2016-10-05 NOTE — Telephone Encounter (Signed)
Order entered. Kathy [name]

## 2016-10-06 ENCOUNTER — Encounter: Payer: Self-pay | Admitting: Internal Medicine

## 2016-10-27 ENCOUNTER — Encounter: Payer: Self-pay | Admitting: Family Medicine

## 2016-10-27 ENCOUNTER — Ambulatory Visit (INDEPENDENT_AMBULATORY_CARE_PROVIDER_SITE_OTHER): Payer: Commercial Managed Care - HMO | Admitting: Family Medicine

## 2016-10-27 ENCOUNTER — Ambulatory Visit
Admission: RE | Admit: 2016-10-27 | Discharge: 2016-10-27 | Disposition: A | Discharge status: Home / Self Care | Payer: Commercial Managed Care - HMO | Source: Ambulatory Visit | Attending: Family Medicine | Admitting: Family Medicine

## 2016-10-27 DIAGNOSIS — M179 Osteoarthritis of knee, unspecified: Secondary | ICD-10-CM | POA: Diagnosis not present

## 2016-10-27 DIAGNOSIS — M25562 Pain in left knee: Secondary | ICD-10-CM

## 2016-10-27 DIAGNOSIS — L309 Dermatitis, unspecified: Secondary | ICD-10-CM

## 2016-10-27 DIAGNOSIS — N318 Other neuromuscular dysfunction of bladder: Secondary | ICD-10-CM | POA: Diagnosis not present

## 2016-10-27 DIAGNOSIS — G8929 Other chronic pain: Secondary | ICD-10-CM

## 2016-10-27 MED ORDER — DARIFENACIN HYDROBROMIDE ER 15 MG PO TB24: 15.0000 mg | ORAL_TABLET | Freq: Every day | ORAL | 3 refills | Status: DC

## 2016-10-27 NOTE — Progress Notes (Signed)
   Subjective:    Patient ID: Kathy [name], female    DOB: [birth year], 65 y.o.   MRN: [id number]  HPI She complains of a one-year history of left knee pain. Within the last week she says the pain has gotten much worse and is now interfering with going up and down steps as well as general activities. No popping, locking or grinding. She has had her right knee injected as well as an arthroscopy done which was apparently for DJD. She would also like a refill on her Enablex at a local drugstore. She also has a lesion present on the back of her neck that she would like evaluated.   Review of Systems     Objective:   Physical Exam Left knee exam shows no effusion. No crepitus noted. Ligaments intact. Negative anterior drawer and McMurray's testing. No joint lipping was noted. Exam of her posterior neck area does show slight hyperpigmentation and dryness.       Assessment & Plan:  Chronic pain of left knee - Plan: DG Knee Complete 4 Views Left  OVERACTIVE BLADDER - Plan: darifenacin (ENABLEX) 15 MG 24 hr tablet  Eczema, unspecified type Recommend using steroid cream on the eczema skin lesions. We will also get an x-ray and have her return here for possible injection pending evaluation of the x-ray. Discussed long-term care of arthritis with her.

## 2016-10-28 ENCOUNTER — Encounter: Payer: Self-pay | Admitting: Family Medicine

## 2016-10-28 ENCOUNTER — Ambulatory Visit (INDEPENDENT_AMBULATORY_CARE_PROVIDER_SITE_OTHER): Payer: Commercial Managed Care - HMO | Admitting: Family Medicine

## 2016-10-28 VITALS — BP 112/70 | HR 82 | Resp 18 | Wt 191.0 lb

## 2016-10-28 DIAGNOSIS — M1990 Unspecified osteoarthritis, unspecified site: Secondary | ICD-10-CM | POA: Diagnosis not present

## 2016-10-28 DIAGNOSIS — N318 Other neuromuscular dysfunction of bladder: Secondary | ICD-10-CM | POA: Diagnosis not present

## 2016-10-28 DIAGNOSIS — M25562 Pain in left knee: Secondary | ICD-10-CM | POA: Diagnosis not present

## 2016-10-28 MED ORDER — LIDOCAINE HCL (PF) 1 % IJ SOLN
3.0000 mL | Freq: Once | INTRAMUSCULAR | Status: AC
  Administered 2016-10-28: 3 mL

## 2016-10-28 MED ORDER — TRIAMCINOLONE ACETONIDE 40 MG/ML IJ SUSP
40.0000 mg | Freq: Once | INTRAMUSCULAR | Status: AC
  Administered 2016-10-28: 40 mg via INTRA_ARTICULAR

## 2016-10-28 MED ORDER — DARIFENACIN HYDROBROMIDE ER 15 MG PO TB24: 15.0000 mg | ORAL_TABLET | Freq: Every day | ORAL | 3 refills | Status: DC

## 2016-10-28 NOTE — Addendum Note (Signed)
Addended by: Arley Phenix L on: 10/28/2016 02:47 PM   Modules accepted: Orders

## 2016-10-28 NOTE — Progress Notes (Signed)
   Subjective:    Patient ID: [name], female    DOB: 04-Sep-1951, 65 y.o.   MRN: [id number]  HPI She is here for an injection. Recent x-ray did show arthritic changes which are causing her significant discomfort.   Review of Systems     Objective:   Physical Exam The left knee was prepped with Betadine laterally, the joint line was identified. 3 mL of Xylocaine and 40 mg of Kenalog was injected into the joint without difficulty. She did express some relief of her symptoms. Enablex was called in at her request.       Assessment & Plan:  Arthritis  OVERACTIVE BLADDER - Plan: darifenacin (ENABLEX) 15 MG 24 hr tablet

## 2016-11-13 ENCOUNTER — Encounter: Payer: Self-pay | Admitting: Podiatry

## 2016-11-13 ENCOUNTER — Ambulatory Visit (INDEPENDENT_AMBULATORY_CARE_PROVIDER_SITE_OTHER): Payer: Commercial Managed Care - HMO | Admitting: Podiatry

## 2016-11-13 ENCOUNTER — Telehealth: Payer: Self-pay | Admitting: Internal Medicine

## 2016-11-13 DIAGNOSIS — B351 Tinea unguium: Secondary | ICD-10-CM

## 2016-11-13 DIAGNOSIS — M79675 Pain in left toe(s): Secondary | ICD-10-CM | POA: Diagnosis not present

## 2016-11-13 DIAGNOSIS — M79674 Pain in right toe(s): Secondary | ICD-10-CM | POA: Diagnosis not present

## 2016-11-13 NOTE — Telephone Encounter (Signed)
[facility] called for referral.   auth approved [id number]

## 2016-11-16 NOTE — Progress Notes (Signed)
Subjective: 65 y.o. returns the office today for painful, elongated, thickened toenails which she cannot trim herself. Denies any redness or drainage around the nails. She's been using the compound cream she is not noticed much change. He nails do get painful with pressure and shoes. Denies any acute changes since last appointment and no new complaints today. Denies any systemic complaints such as fevers, chills, nausea, vomiting.   Objective: AAO 3, NAD DP/PT pulses palpable, CRT less than 3 seconds Nails hypertrophic, dystrophic, elongated, brittle, discolored 10. There is tenderness overlying the nails 1-5 bilaterally. There is no surrounding erythema or drainage along the nail sites. There does appear to be some evidence of minimal clearing along the hallux nails.  No open lesions or pre-ulcerative lesions are identified. No other areas of tenderness bilateral lower extremities. No overlying edema, erythema, increased warmth. No pain with calf compression, swelling, warmth, erythema.  Assessment: Patient presents with symptomatic onychomycosis  Plan: -Treatment options including alternatives, risks, complications were discussed -Nails sharply debrided 10 without complication/bleeding. -Continue compound ointment for now. She has only been using this for 3 months.  -Discussed daily foot inspection. If there are any changes, to call the office immediately.  -Follow-up in 3 months or sooner if any problems are to arise. In the meantime, encouraged to call the office with any questions, concerns, changes symptoms.  Celesta Gentile, DPM

## 2016-11-19 ENCOUNTER — Telehealth: Payer: Self-pay | Admitting: Family Medicine

## 2016-11-19 DIAGNOSIS — N318 Other neuromuscular dysfunction of bladder: Secondary | ICD-10-CM

## 2016-11-19 MED ORDER — MELOXICAM 7.5 MG PO TABS: ORAL_TABLET | ORAL | 3 refills | Status: DC

## 2016-11-19 NOTE — Telephone Encounter (Signed)
Pt called requesting a refill on her meloxicam pt uses [place], [place]

## 2016-11-26 ENCOUNTER — Other Ambulatory Visit: Payer: Self-pay

## 2016-11-26 ENCOUNTER — Telehealth: Payer: Self-pay | Admitting: Family Medicine

## 2016-11-26 DIAGNOSIS — I10 Essential (primary) hypertension: Secondary | ICD-10-CM

## 2016-11-26 MED ORDER — OLMESARTAN-AMLODIPINE-HCTZ 40-10-12.5 MG PO TABS: ORAL_TABLET | ORAL | 3 refills | Status: DC

## 2016-11-26 NOTE — Telephone Encounter (Signed)
This has been sent to [facility] 

## 2016-11-26 NOTE — Telephone Encounter (Signed)
Pt needs rx for Tribenzor sent to local pharmacy, [place] on [address]. She no longer uses mail order pharmacy.

## 2016-11-26 NOTE — Telephone Encounter (Signed)
Pt called and stated that a prior authorization is required for Enablex. She did pick up rx and paid for out of pocket. She needs authorization for future refills. Pt can be reached at [phone].

## 2016-12-01 ENCOUNTER — Ambulatory Visit (AMBULATORY_SURGERY_CENTER): Payer: Self-pay

## 2016-12-01 VITALS — Ht 62.0 in | Wt 194.6 lb

## 2016-12-01 DIAGNOSIS — Z800 Family history of malignant neoplasm of digestive organs: Secondary | ICD-10-CM

## 2016-12-01 MED ORDER — NA SULFATE-K SULFATE-MG SULF 17.5-3.13-1.6 GM/177ML PO SOLN: ORAL | 0 refills | Status: DC

## 2016-12-01 NOTE — Progress Notes (Signed)
Per pt, no allergies to soy or egg products.Pt not taking any weight loss meds or using  O2 at home. 

## 2016-12-09 NOTE — Telephone Encounter (Signed)
Called [facility] and Enablex did not require a P.A. And it went thru for $97.  Called pt & informed

## 2016-12-14 ENCOUNTER — Encounter: Payer: Self-pay | Admitting: Internal Medicine

## 2016-12-14 ENCOUNTER — Ambulatory Visit (AMBULATORY_SURGERY_CENTER): Payer: Medicare HMO | Admitting: Internal Medicine

## 2016-12-14 VITALS — BP 111/56 | HR 68 | Temp 95.0°F | Resp 12 | Ht 62.0 in | Wt 194.0 lb

## 2016-12-14 DIAGNOSIS — Z1212 Encounter for screening for malignant neoplasm of rectum: Secondary | ICD-10-CM | POA: Diagnosis not present

## 2016-12-14 DIAGNOSIS — Z800 Family history of malignant neoplasm of digestive organs: Secondary | ICD-10-CM | POA: Diagnosis not present

## 2016-12-14 DIAGNOSIS — I10 Essential (primary) hypertension: Secondary | ICD-10-CM | POA: Diagnosis not present

## 2016-12-14 DIAGNOSIS — Z1211 Encounter for screening for malignant neoplasm of colon: Secondary | ICD-10-CM | POA: Diagnosis present

## 2016-12-14 DIAGNOSIS — R69 Illness, unspecified: Secondary | ICD-10-CM | POA: Diagnosis not present

## 2016-12-14 DIAGNOSIS — D869 Sarcoidosis, unspecified: Secondary | ICD-10-CM | POA: Diagnosis not present

## 2016-12-14 DIAGNOSIS — E669 Obesity, unspecified: Secondary | ICD-10-CM | POA: Diagnosis not present

## 2016-12-14 MED ORDER — SODIUM CHLORIDE 0.9 % IV SOLN: 500.0000 mL | INTRAVENOUS | Status: DC

## 2016-12-14 NOTE — Op Note (Signed)
[place] Patient Name: Kathy [name] Procedure Date: 12/14/2016 9:23 AM MRN: [id number] Endoscopist: Docia Chuck. Henrene Pastor , MD Age: 66 Referring MD:  Date of Birth: Feb 02, 1951 Gender: Female Account #: [id number] Procedure:                Colonoscopy Indications:              Screening in patient at increased risk: Colorectal                            cancer in mother before age 43; reports negative                            index exam in [place]                            approximately 10 years ago Medicines:                Monitored Anesthesia Care Procedure:                Pre-Anesthesia Assessment:                           - Prior to the procedure, a History and Physical                            was performed, and patient medications and                            allergies were reviewed. The patient's tolerance of                            previous anesthesia was also reviewed. The risks                            and benefits of the procedure and the sedation                            options and risks were discussed with the patient.                            All questions were answered, and informed consent                            was obtained. Prior Anticoagulants: The patient has                            taken no previous anticoagulant or antiplatelet                            agents. ASA Grade Assessment: II - A patient with                            mild systemic disease. After reviewing the risks  and benefits, the patient was deemed in                            satisfactory condition to undergo the procedure.                           After obtaining informed consent, the colonoscope                            was passed under direct vision. Throughout the                            procedure, the patient's blood pressure, pulse, and                            oxygen saturations were monitored  continuously. The                            Colonoscope was introduced through the anus and                            advanced to the the cecum, identified by                            appendiceal orifice and ileocecal valve. The                            ileocecal valve, appendiceal orifice, and rectum                            were photographed. The quality of the bowel                            preparation was excellent. The colonoscopy was                            performed without difficulty. The patient tolerated                            the procedure well. The bowel preparation used was                            SUPREP. Scope In: 9:37:02 AM Scope Out: 9:55:07 AM Scope Withdrawal Time: 0 hours 12 minutes 14 seconds  Total Procedure Duration: 0 hours 18 minutes 5 seconds  Findings:                 A few medium-mouthed diverticula were found in the                            ascending colon.                           Internal hemorrhoids were found during  retroflexion. The hemorrhoids were small.                           The exam was otherwise without abnormality on                            direct and retroflexion views. Complications:            No immediate complications. Estimated blood loss:                            None. Estimated Blood Loss:     Estimated blood loss: none. Impression:               - Diverticulosis in the ascending colon.                           - Internal hemorrhoids.                           - The examination was otherwise normal on direct                            and retroflexion views.                           - No specimens collected. Recommendation:           - Repeat colonoscopy in 5 years for screening                            purposes.                           - Patient has a contact number available for                            emergencies. The signs and symptoms of potential                             delayed complications were discussed with the                            patient. Return to normal activities tomorrow.                            Written discharge instructions were provided to the                            patient.                           - Resume previous diet.                           - Continue present medications. Docia Chuck. Henrene Pastor, MD 12/14/2016 10:00:54 AM This report has been signed electronically.

## 2016-12-14 NOTE — Patient Instructions (Signed)
Impression/Recommendations:  Diverticulosis handout given to patient. Hemorrhoid handout given to patient.  Repeat colonoscopy in 5 years for screening purposes.  YOU HAD AN ENDOSCOPIC PROCEDURE TODAY AT [facility]:   Refer to the procedure report that was given to you for any specific questions about what was found during the examination.  If the procedure report does not answer your questions, please call your gastroenterologist to clarify.  If you requested that your care partner not be given the details of your procedure findings, then the procedure report has been included in a sealed envelope for you to review at your convenience later.  YOU SHOULD EXPECT: Some feelings of bloating in the abdomen. Passage of more gas than usual.  Walking can help get rid of the air that was put into your GI tract during the procedure and reduce the bloating. If you had a lower endoscopy (such as a colonoscopy or flexible sigmoidoscopy) you may notice spotting of blood in your stool or on the toilet paper. If you underwent a bowel prep for your procedure, you may not have a normal bowel movement for a few days.  Please Note:  You might notice some irritation and congestion in your nose or some drainage.  This is from the oxygen used during your procedure.  There is no need for concern and it should clear up in a day or so.  SYMPTOMS TO REPORT IMMEDIATELY:   Following lower endoscopy (colonoscopy or flexible sigmoidoscopy):  Excessive amounts of blood in the stool  Significant tenderness or worsening of abdominal pains  Swelling of the abdomen that is new, acute  Fever of 100F or higher  For urgent or emergent issues, a gastroenterologist can be reached at any hour by calling [phone].   DIET:  We do recommend a small meal at first, but then you may proceed to your regular diet.  Drink plenty of fluids but you should avoid alcoholic beverages for 24 hours.  ACTIVITY:  You  should plan to take it easy for the rest of today and you should NOT DRIVE or use heavy machinery until tomorrow (because of the sedation medicines used during the test).    FOLLOW UP: Our staff will call the number listed on your records the next business day following your procedure to check on you and address any questions or concerns that you may have regarding the information given to you following your procedure. If we do not reach you, we will leave a message.  However, if you are feeling well and you are not experiencing any problems, there is no need to return our call.  We will assume that you have returned to your regular daily activities without incident.  If any biopsies were taken you will be contacted by phone or by letter within the next 1-3 weeks.  Please call us at [phone] if you have not heard about the biopsies in 3 weeks.    SIGNATURES/CONFIDENTIALITY: You and/or your care partner have signed paperwork which will be entered into your electronic medical record.  These signatures attest to the fact that that the information above on your After Visit Summary has been reviewed and is understood.  Full responsibility of the confidentiality of this discharge information lies with you and/or your care-partner.

## 2016-12-14 NOTE — Progress Notes (Signed)
To recovery, report to RN, VSS. 

## 2016-12-15 ENCOUNTER — Telehealth: Payer: Self-pay

## 2016-12-15 NOTE — Telephone Encounter (Signed)
  Follow up Call-  Call back number 12/14/2016  Post procedure Call Back phone  # [phone]  Permission to leave phone message Yes  Some recent data might be hidden     Patient questions:  Do you have a fever, pain , or abdominal swelling? No. Pain Score  0 *  Have you tolerated food without any problems? Yes.    Have you been able to return to your normal activities? Yes.    Do you have any questions about your discharge instructions: Diet   No. Medications  No. Follow up visit  No.  Do you have questions or concerns about your Care? No.  Actions: * If pain score is 4 or above: No action needed, pain <4.

## 2016-12-20 ENCOUNTER — Emergency Department (HOSPITAL_COMMUNITY): Payer: Medicare HMO

## 2016-12-20 ENCOUNTER — Encounter (HOSPITAL_COMMUNITY): Payer: Self-pay | Admitting: Emergency Medicine

## 2016-12-20 ENCOUNTER — Emergency Department (HOSPITAL_COMMUNITY)
Admission: EM | Admit: 2016-12-20 | Discharge: 2016-12-20 | Disposition: A | Discharge status: Home / Self Care | Payer: Medicare HMO | Attending: Emergency Medicine | Admitting: Emergency Medicine

## 2016-12-20 DIAGNOSIS — Z79899 Other long term (current) drug therapy: Secondary | ICD-10-CM | POA: Diagnosis not present

## 2016-12-20 DIAGNOSIS — Y999 Unspecified external cause status: Secondary | ICD-10-CM | POA: Insufficient documentation

## 2016-12-20 DIAGNOSIS — S299XXA Unspecified injury of thorax, initial encounter: Secondary | ICD-10-CM | POA: Diagnosis not present

## 2016-12-20 DIAGNOSIS — Y92410 Unspecified street and highway as the place of occurrence of the external cause: Secondary | ICD-10-CM | POA: Insufficient documentation

## 2016-12-20 DIAGNOSIS — S29012A Strain of muscle and tendon of back wall of thorax, initial encounter: Secondary | ICD-10-CM | POA: Insufficient documentation

## 2016-12-20 DIAGNOSIS — Y939 Activity, unspecified: Secondary | ICD-10-CM | POA: Insufficient documentation

## 2016-12-20 DIAGNOSIS — I10 Essential (primary) hypertension: Secondary | ICD-10-CM | POA: Diagnosis not present

## 2016-12-20 DIAGNOSIS — Z91040 Latex allergy status: Secondary | ICD-10-CM | POA: Diagnosis not present

## 2016-12-20 DIAGNOSIS — S29011A Strain of muscle and tendon of front wall of thorax, initial encounter: Secondary | ICD-10-CM | POA: Diagnosis not present

## 2016-12-20 DIAGNOSIS — T148XXA Other injury of unspecified body region, initial encounter: Secondary | ICD-10-CM | POA: Diagnosis not present

## 2016-12-20 DIAGNOSIS — S3992XA Unspecified injury of lower back, initial encounter: Secondary | ICD-10-CM | POA: Diagnosis not present

## 2016-12-20 DIAGNOSIS — S29019A Strain of muscle and tendon of unspecified wall of thorax, initial encounter: Secondary | ICD-10-CM

## 2016-12-20 MED ORDER — ACETAMINOPHEN 325 MG PO TABS
650.0000 mg | ORAL_TABLET | Freq: Once | ORAL | Status: AC
  Administered 2016-12-20: 650 mg via ORAL
  Filled 2016-12-20: qty 2

## 2016-12-20 MED ORDER — IBUPROFEN 200 MG PO TABS
200.0000 mg | ORAL_TABLET | Freq: Once | ORAL | Status: AC
  Administered 2016-12-20: 200 mg via ORAL
  Filled 2016-12-20: qty 1

## 2016-12-20 NOTE — Discharge Instructions (Signed)
It was our pleasure to provide your ER care today - we hope that you feel better.  Your xrays were read by our radiologist as showing no acute fracture.  Rest. Heat to sore area. Take acetaminophen and/or ibuprofen as need for pain.   Follow up with primary care doctor in 1 week if symptoms fail to improve/resolve.  Return to ER if worse, new or severe pain, other concern.
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
## 2016-12-31 ENCOUNTER — Telehealth: Payer: Self-pay | Admitting: Family Medicine

## 2016-12-31 MED ORDER — TOLTERODINE TARTRATE ER 4 MG PO CP24: 4.0000 mg | ORAL_CAPSULE | Freq: Every day | ORAL | 5 refills | Status: DC

## 2016-12-31 NOTE — Telephone Encounter (Signed)
[facility] [address] req Tolterodine refill

## 2016-12-31 NOTE — Telephone Encounter (Signed)
Med is Tolterodine (detrol LA). Do not see this on current med list, pt has taken this before (per med list history)

## 2016-12-31 NOTE — Telephone Encounter (Signed)
Find out what this is.

## 2016-12-31 NOTE — Addendum Note (Signed)
Addended by: Denita Lung on: 12/31/2016 02:16 PM   Modules accepted: Orders

## 2017-01-01 MED ORDER — TOLTERODINE TARTRATE ER 4 MG PO CP24: 4.0000 mg | ORAL_CAPSULE | Freq: Every day | ORAL | 5 refills | Status: DC

## 2017-01-25 ENCOUNTER — Other Ambulatory Visit: Payer: Self-pay

## 2017-01-25 ENCOUNTER — Telehealth: Payer: Self-pay

## 2017-01-25 DIAGNOSIS — I10 Essential (primary) hypertension: Secondary | ICD-10-CM

## 2017-01-25 MED ORDER — OLMESARTAN-AMLODIPINE-HCTZ 40-10-12.5 MG PO TABS: ORAL_TABLET | ORAL | 2 refills | Status: DC

## 2017-01-25 NOTE — Telephone Encounter (Signed)
I have sent med to [facility]

## 2017-01-25 NOTE — Telephone Encounter (Signed)
Fax request to [facility]  For refill of olmesartan-amlodipine-hctz.

## 2017-01-27 DIAGNOSIS — Z1231 Encounter for screening mammogram for malignant neoplasm of breast: Secondary | ICD-10-CM | POA: Diagnosis not present

## 2017-01-27 LAB — HM MAMMOGRAPHY

## 2017-01-29 ENCOUNTER — Encounter: Payer: Self-pay | Admitting: Family Medicine

## 2017-02-02 ENCOUNTER — Telehealth: Payer: Self-pay

## 2017-02-02 DIAGNOSIS — I10 Essential (primary) hypertension: Secondary | ICD-10-CM

## 2017-02-02 MED ORDER — OLMESARTAN-AMLODIPINE-HCTZ 40-10-12.5 MG PO TABS: ORAL_TABLET | ORAL | 2 refills | Status: DC

## 2017-02-02 NOTE — Telephone Encounter (Signed)
Additional request rcvd for pts tribenzor. Called in tribenzor electronically. Printed in error on 01/25/2017 and [employer] did not receive. Kathy [name]

## 2017-02-03 NOTE — Telephone Encounter (Signed)
This encounter was created in error - please disregard.

## 2017-02-05 ENCOUNTER — Ambulatory Visit: Payer: Commercial Managed Care - HMO | Admitting: Podiatry

## 2017-02-09 ENCOUNTER — Telehealth: Payer: Self-pay | Admitting: Family Medicine

## 2017-02-09 NOTE — Telephone Encounter (Signed)
P.A. DARIFENACIN

## 2017-02-10 ENCOUNTER — Encounter: Payer: Self-pay | Admitting: Podiatry

## 2017-02-10 ENCOUNTER — Ambulatory Visit (INDEPENDENT_AMBULATORY_CARE_PROVIDER_SITE_OTHER): Payer: Medicare HMO | Admitting: Podiatry

## 2017-02-10 VITALS — BP 146/100 | HR 87 | Resp 18

## 2017-02-10 DIAGNOSIS — B351 Tinea unguium: Secondary | ICD-10-CM

## 2017-02-10 DIAGNOSIS — M79675 Pain in left toe(s): Secondary | ICD-10-CM

## 2017-02-10 DIAGNOSIS — M79674 Pain in right toe(s): Secondary | ICD-10-CM

## 2017-02-10 DIAGNOSIS — L84 Corns and callosities: Secondary | ICD-10-CM

## 2017-02-11 NOTE — Progress Notes (Signed)
Subjective: 66 y.o. returns the office today for painful, elongated, thickened toenails which she cannot trim herself. Denies any redness or drainage around the nails. She also has corns to her 5th toes which are painful with pressure. new complaints today. Denies any systemic complaints such as fevers, chills, nausea, vomiting.   Objective: AAO 3, NAD DP/PT pulses palpable, CRT less than 3 seconds Nails hypertrophic, dystrophic, elongated, brittle, discolored 10. There is tenderness overlying the nails 1-5 bilaterally. There is no surrounding erythema or drainage along the nail sites. There does appear to be some evidence of minimal clearing along the hallux nails.  Adductovarus of bilateral fifth toes is present. Hyperkeratotic lesion dorsal lateral fifth digit. No underlying ulceration, drainage or any signs of infection. No open lesions or pre-ulcerative lesions are identified. No other areas of tenderness bilateral lower extremities. No overlying edema, erythema, increased warmth. No pain with calf compression, swelling, warmth, erythema.  Assessment: Patient presents with symptomatic onychomycosis, hyperkeratotic lesions  Plan: -Treatment options including alternatives, risks, complications were discussed -Nails sharply debrided 10 without complication/bleeding. -Hyperkeratotic lesions sharply debrided 2 without complications or bleeding. -Discussed daily foot inspection. If there are any changes, to call the office immediately.  -Follow-up in 3 months or sooner if any problems are to arise. In the meantime, encouraged to call the office with any questions, concerns, changes symptoms.  Celesta Gentile, DPM

## 2017-02-20 NOTE — Telephone Encounter (Signed)
P.A. Darifenacin ER denied, pt needs trial of Oxybutynin IR or ER, Toviaz, Myrbetriq or Vesicare.  Do you want to switch?

## 2017-02-21 NOTE — Telephone Encounter (Signed)
Let her know that and switch her to Myrbetriq 25 mg. Give her 30 with 1 refill and have her call and let us know if it's working after a couple weeks

## 2017-02-25 MED ORDER — MIRABEGRON ER 25 MG PO TB24: 25.0000 mg | ORAL_TABLET | Freq: Every day | ORAL | 1 refills | Status: DC

## 2017-02-25 NOTE — Telephone Encounter (Signed)
Pt informed, she will finish up the Darifenacin then start the new medication and call & let us know how it works

## 2017-04-07 ENCOUNTER — Encounter: Payer: Self-pay | Admitting: Family Medicine

## 2017-04-07 ENCOUNTER — Ambulatory Visit (INDEPENDENT_AMBULATORY_CARE_PROVIDER_SITE_OTHER): Payer: Medicare HMO | Admitting: Family Medicine

## 2017-04-07 VITALS — BP 114/60 | HR 78 | Wt 196.0 lb

## 2017-04-07 DIAGNOSIS — R142 Eructation: Secondary | ICD-10-CM | POA: Diagnosis not present

## 2017-04-07 NOTE — Progress Notes (Signed)
   Subjective:    Patient ID: Kathy [name], female    DOB: Dec 05, 1950, 66 y.o.   MRN: [id number]  HPI She complains of a 2 day history of difficulty with excessive eructation and she also notes slight chest discomfort when she leans forward. There is no associated abdominal pain, nausea, vomiting, change in bowel habits, relation to any particular foods.   Review of Systems     Objective:   Physical Exam Alert and in no distress. No chest wall tenderness. Cardiac exam shows regular rhythm without murmurs or gallops. Lungs are clear to auscultation. Abdominal exam shows no masses or tenderness.       Assessment & Plan:  Eructation I reassured her that I found nothing of major concern. Recommend she try Gas-X and also patient attention if this is related to any foods in particular. Discussed possible gallbladder disease. At the end of the encounter, mention weight reduction. Discussed diet and exercise with her also Weight Watchers as a potential place for her to go. He is to return here in a week or so for another exam.

## 2017-04-07 NOTE — Patient Instructions (Signed)
Pay attention to when you're having a lot of gas and see if it is related food and also try Gas-X

## 2017-04-13 ENCOUNTER — Encounter: Payer: Self-pay | Admitting: Family Medicine

## 2017-04-13 ENCOUNTER — Ambulatory Visit (INDEPENDENT_AMBULATORY_CARE_PROVIDER_SITE_OTHER): Payer: Medicare HMO | Admitting: Family Medicine

## 2017-04-13 VITALS — BP 124/70 | HR 68 | Ht 66.0 in | Wt 195.0 lb

## 2017-04-13 DIAGNOSIS — R300 Dysuria: Secondary | ICD-10-CM | POA: Diagnosis not present

## 2017-04-13 DIAGNOSIS — H5440 Blindness, one eye, unspecified eye: Secondary | ICD-10-CM | POA: Diagnosis not present

## 2017-04-13 DIAGNOSIS — H3530 Unspecified macular degeneration: Secondary | ICD-10-CM

## 2017-04-13 DIAGNOSIS — H3122 Choroidal dystrophy (central areolar) (generalized) (peripapillary): Secondary | ICD-10-CM | POA: Diagnosis not present

## 2017-04-13 DIAGNOSIS — Z13820 Encounter for screening for osteoporosis: Secondary | ICD-10-CM | POA: Diagnosis not present

## 2017-04-13 DIAGNOSIS — D869 Sarcoidosis, unspecified: Secondary | ICD-10-CM

## 2017-04-13 DIAGNOSIS — M797 Fibromyalgia: Secondary | ICD-10-CM

## 2017-04-13 DIAGNOSIS — N318 Other neuromuscular dysfunction of bladder: Secondary | ICD-10-CM

## 2017-04-13 DIAGNOSIS — I10 Essential (primary) hypertension: Secondary | ICD-10-CM | POA: Diagnosis not present

## 2017-04-13 DIAGNOSIS — H538 Other visual disturbances: Secondary | ICD-10-CM | POA: Diagnosis not present

## 2017-04-13 DIAGNOSIS — M1990 Unspecified osteoarthritis, unspecified site: Secondary | ICD-10-CM

## 2017-04-13 DIAGNOSIS — J301 Allergic rhinitis due to pollen: Secondary | ICD-10-CM

## 2017-04-13 DIAGNOSIS — Z13220 Encounter for screening for lipoid disorders: Secondary | ICD-10-CM | POA: Diagnosis not present

## 2017-04-13 LAB — CBC WITH DIFFERENTIAL/PLATELET
BASOS ABS: 47 {cells}/uL (ref 0–200)
Basophils Relative: 1 %
EOS ABS: 188 {cells}/uL (ref 15–500)
EOS PCT: 4 %
HCT: 43.8 % (ref 35.0–45.0)
Hemoglobin: 14.7 g/dL (ref 11.7–15.5)
LYMPHS PCT: 37 %
Lymphs Abs: 1739 cells/uL (ref 850–3900)
MCH: 29.2 pg (ref 27.0–33.0)
MCHC: 33.6 g/dL (ref 32.0–36.0)
MCV: 87.1 fL (ref 80.0–100.0)
MONOS PCT: 9 %
MPV: 9.5 fL (ref 7.5–12.5)
Monocytes Absolute: 423 cells/uL (ref 200–950)
NEUTROS ABS: 2303 {cells}/uL (ref 1500–7800)
Neutrophils Relative %: 49 %
PLATELETS: 281 10*3/uL (ref 140–400)
RBC: 5.03 MIL/uL (ref 3.80–5.10)
RDW: 13.5 % (ref 11.0–15.0)
WBC: 4.7 10*3/uL (ref 4.0–10.5)

## 2017-04-13 LAB — POCT URINALYSIS DIPSTICK
BILIRUBIN UA: NEGATIVE
Blood, UA: NEGATIVE
Glucose, UA: NEGATIVE
KETONES UA: NEGATIVE
LEUKOCYTES UA: NEGATIVE
NITRITE UA: NEGATIVE
PH UA: 6 (ref 5.0–8.0)
PROTEIN UA: NEGATIVE
Spec Grav, UA: 1.025 (ref 1.010–1.025)
Urobilinogen, UA: NEGATIVE E.U./dL — AB

## 2017-04-13 NOTE — Progress Notes (Signed)
Subjective:   HPI  Kathy [name] is a 66 y.o. female who presents for Chief Complaint  Patient presents with  . Medicare Wellness    welcome to medicare     Medical care team includes: Denita Lung, MD here for primary care  Dr.Michael Frederico Hamman Dr.Wagner Dr.Rankin     Preventative care: [employer] Last ophthalmology visit: 03/14/17 Last dental visit: 01/2017 Last colonoscopy:12/14/16 Last mammogram:01/27/17 Last pap: N/A history of hysterectomy. Last EKG:04/10/15 Last labs:02/03/16  Prior vaccinations: TD or Tdap: 02/13/16 Influenza:09/30/16 Pneumococcal: 23:02/03/16 13: 08/30/14 Shingles/Zostavax: 01/28/12   Advanced directive: No. Information given. Health care power of attorney: Living will:  Concerns: She does complain of a three-day history of frequency and dysuria but no fever, chills, abdominal pain. She does have allergies and is taking no medications for that. She is being followed by ophthalmology for her macular degeneration. She is taking meloxicam for her arthritis and getting good results. She has had left knee pain with an injection which gives her at least 3 months worth of benefit. She is taking TRIBENZOR for her hypertension. OAB is being cared for as well. She plans to switch to Myrbetriq. She does have underlying sarcoid and rarely uses an albuterol inhaler. She does have a history of fibromyalgia but seems to have this under fairly good control. She exercises regularly and is potentially interested in Weight Watchers or [employer].  Reviewed their medical, surgical, family, social, medication, and allergy history and updated chart as appropriate.  Past Medical History:  Diagnosis Date  . Exotropia of left eye   . Fibromyalgia   . History of uterine leiomyoma   . Hypertension   . Macular degeneration   . OA (osteoarthritis)   . Overactive bladder   . Sarcoidosis   . Wears glasses     Past Surgical History:  Procedure Laterality Date  . ADJUSTABLE  SUTURE MANIPULATION Left 04/10/2015   Procedure: ADJUSTABLE SUTURE MANIPULATION;  Surgeon: Gevena Cotton, MD;  Location: [facility];  Service: Ophthalmology;  Laterality: Left;  Marland Kitchen MEDIAN RECTUS REPAIR Left 04/10/2015   Procedure: MEDIAN RECTUS RESECTION LATERAL RECTUS RECESSION,AJUSTABLES SUTURES LEFT EYE;  Surgeon: Gevena Cotton, MD;  Location: [facility];  Service: Ophthalmology;  Laterality: Left;  . TONSILLECTOMY  as child  . TOTAL ABDOMINAL HYSTERECTOMY W/ BILATERAL SALPINGOOPHORECTOMY  10-14-2010   and TVT midurethral sling and culdoplasty    Social History   Social History  . Marital status: Married    Spouse name: N/A  . Number of children: N/A  . Years of education: N/A   Occupational History  . Not on file.   Social History Main Topics  . Smoking status: Never Smoker  . Smokeless tobacco: Never Used  . Alcohol use Yes     Comment: rare  . Drug use: No  . Sexual activity: Yes   Other Topics Concern  . Not on file   Social History Narrative  . No narrative on file    Family History  Problem Relation Age of Onset  . Colon cancer Mother   . Diabetes Father   . Diabetes Sister   . Hypertension Sister   . Diabetes Sister   . Hypertension Sister   . Hypertension Sister      Current Outpatient Prescriptions:  .  albuterol (PROVENTIL HFA;VENTOLIN HFA) 108 (90 Base) MCG/ACT inhaler, Inhale 2 puffs into the lungs every 6 (six) hours as needed for wheezing or shortness of breath., Disp: 1 Inhaler, Rfl: 0 .  cycloSPORINE (RESTASIS) 0.05 % ophthalmic emulsion, Place 1 drop into both eyes 2 (two) times daily., Disp: , Rfl:  .  meloxicam (MOBIC) 7.5 MG tablet, Take 1 tablet by mouth  daily, Disp: 90 tablet, Rfl: 3 .  Multiple Vitamins-Minerals (PRESERVISION/LUTEIN) CAPS, Take by mouth., Disp: , Rfl:  .  NONFORMULARY OR COMPOUNDED ITEM, Apply 1-2 g topically daily. [employer] Nail lacquer: Fluconazole 2%, Terbinafine 1%, DMSO, Disp: 120  each, Rfl: 3 .  Olmesartan-Amlodipine-HCTZ (TRIBENZOR) 40-10-12.5 MG TABS, Take 1 tablet by mouth  every day, Disp: 90 tablet, Rfl: 2 .  tolterodine (DETROL LA) 4 MG 24 hr capsule, Take 1 capsule (4 mg total) by mouth daily., Disp: 30 capsule, Rfl: 5 .  mirabegron ER (MYRBETRIQ) 25 MG TB24 tablet, Take 1 tablet (25 mg total) by mouth daily. (Patient not taking: Reported on 04/07/2017), Disp: 30 tablet, Rfl: 1  Current Facility-Administered Medications:  .  0.9 %  sodium chloride infusion, 500 mL, Intravenous, Continuous, Irene Shipper, MD  Allergies  Allergen Reactions  . Lortab [Hydrocodone-Acetaminophen] Nausea And Vomiting  . Latex     Pt unsure of reaction!       Review of Systems Except as above    Objective:  General appearance: alert, no distress, WD/WN, African American female Skin: Normal HEENT: normocephalic, conjunctiva/corneas normal, sclerae anicteric, PERRLA, EOMi, nares patent, no discharge or erythema, pharynx normal Oral cavity: MMM, tongue normal, teeth normal Neck: supple, no lymphadenopathy, no thyromegaly, no masses, normal ROM, no bruits Chest: non tender, normal shape and expansion Heart: RRR, normal S1, S2, no murmurs Lungs: CTA bilaterally, no wheezes, rhonchi, or rales Abdomen: +bs, soft, non tender, non distended, no masses, no hepatomegaly, no splenomegaly, no bruits Back: non tender, normal ROM, no scoliosis Musculoskeletal: upper extremities non tender, no obvious deformity, normal ROM throughout, lower extremities non tender, no obvious deformity, normal ROM throughout Extremities: no edema, no cyanosis, no clubbing Pulses: 2+ symmetric, upper and lower extremities, normal cap refill Neurological: alert, oriented x 3, CN2-12 intact, strength normal upper extremities and lower extremities, sensation normal throughout, DTRs 2+ throughout, no cerebellar signs, gait normal Psychiatric: normal affect, behavior normal, pleasant    Assessment and Plan :     Burning with urination - Plan: POCT Urinalysis Dipstick  Essential hypertension - Plan: CBC with Differential/Platelet, Comprehensive metabolic panel  OVERACTIVE BLADDER  Arthritis  Allergic rhinitis due to pollen, unspecified seasonality  Fibromyalgia - Plan: CBC with Differential/Platelet, Comprehensive metabolic panel  Macular degeneration, unspecified laterality, unspecified type  Sarcoidosis - Plan: CBC with Differential/Platelet, Comprehensive metabolic panel  Screening for osteoporosis - Plan: DG Bone Density  Screening for lipid disorders - Plan: Lipid panel Patient is stable on the above diagnoses with appropriate medications. Encouraged her to continue with her active lifestyle  Physical exam - discussed and counseled on healthy lifestyle, diet, exercise, preventative care, vaccinations, sick and well care, proper use of emergency dept and after hours care, and addressed their concerns.

## 2017-04-13 NOTE — Patient Instructions (Signed)
  Ms. Shiel , Thank you for taking time to come for your Medicare Wellness Visit. I appreciate your ongoing commitment to your health goals. Please review the following plan we discussed and let me know if I can assist you in the future.   These are the goals we discussed: Goals    None      This is a list of the screening recommended for you and due dates:  Health Maintenance  Topic Date Due  . HIV Screening  08/12/1966  . Pap Smear  08/12/1972  . DEXA scan (bone density measurement)  08/12/2016  . Flu Shot  06/30/2017  . Mammogram  01/27/2019  . Pneumonia vaccines (2 of 2 - PPSV23) 02/02/2021  . Colon Cancer Screening  12/14/2021  . Tetanus Vaccine  02/02/2026  .  Hepatitis C: One time screening is recommended by Center for Disease Control  (CDC) for  adults born from [birth year] through 1965.   Completed

## 2017-04-14 LAB — COMPREHENSIVE METABOLIC PANEL
ALT: 11 U/L (ref 6–29)
AST: 20 U/L (ref 10–35)
Albumin: 4.8 g/dL (ref 3.6–5.1)
Alkaline Phosphatase: 86 U/L (ref 33–130)
BUN: 14 mg/dL (ref 7–25)
CHLORIDE: 101 mmol/L (ref 98–110)
CO2: 25 mmol/L (ref 20–31)
CREATININE: 0.82 mg/dL (ref 0.50–0.99)
Calcium: 9.8 mg/dL (ref 8.6–10.4)
GLUCOSE: 84 mg/dL (ref 65–99)
POTASSIUM: 3.8 mmol/L (ref 3.5–5.3)
SODIUM: 141 mmol/L (ref 135–146)
Total Bilirubin: 0.6 mg/dL (ref 0.2–1.2)
Total Protein: 8 g/dL (ref 6.1–8.1)

## 2017-04-14 LAB — LIPID PANEL
CHOL/HDL RATIO: 3.8 ratio (ref <5.0)
CHOLESTEROL: 277 mg/dL — AB (ref <200)
HDL: 73 mg/dL (ref >50)
LDL CALC: 176 mg/dL — AB (ref <100)
Triglycerides: 141 mg/dL (ref <150)
VLDL: 28 mg/dL (ref <30)

## 2017-05-13 ENCOUNTER — Ambulatory Visit: Payer: Medicare HMO | Admitting: Podiatry

## 2017-09-01 ENCOUNTER — Other Ambulatory Visit (INDEPENDENT_AMBULATORY_CARE_PROVIDER_SITE_OTHER): Payer: Medicare HMO

## 2017-09-01 DIAGNOSIS — Z23 Encounter for immunization: Secondary | ICD-10-CM

## 2017-11-18 ENCOUNTER — Other Ambulatory Visit: Payer: Self-pay | Admitting: Family Medicine

## 2017-11-18 DIAGNOSIS — N318 Other neuromuscular dysfunction of bladder: Secondary | ICD-10-CM

## 2017-11-18 DIAGNOSIS — I10 Essential (primary) hypertension: Secondary | ICD-10-CM

## 2017-12-08 ENCOUNTER — Encounter: Payer: Self-pay | Admitting: Family Medicine

## 2017-12-08 ENCOUNTER — Ambulatory Visit (INDEPENDENT_AMBULATORY_CARE_PROVIDER_SITE_OTHER): Payer: Medicare Other | Admitting: Family Medicine

## 2017-12-08 VITALS — BP 132/80 | HR 82 | Ht 62.0 in | Wt 166.8 lb

## 2017-12-08 DIAGNOSIS — S8002XA Contusion of left knee, initial encounter: Secondary | ICD-10-CM | POA: Diagnosis not present

## 2017-12-08 NOTE — Progress Notes (Signed)
   Subjective:    Patient ID: Kathy [name], female    DOB: [birth year], 67 y.o.   MRN: [id number]  HPI She slipped and fell in November injuring the lateral aspect of her left knee.  She has had difficulty with walking due to that but no popping, locking or grinding.  She states that she is now roughly 80% better.  She notes difficulty mainly when she exercises.   Review of Systems     Objective:   Physical Exam Alert and in no distress.  Left knee exam shows no effusion.  No joint line tenderness.  Medial and lateral collateral ligaments intact.  Negative anterior drawer.  McMurray's testing negative.  Tender to palpation over the lateral aspect of the tibia in the area of the ITB.       Assessment & Plan:  Contusion of left knee, initial encounter I reassured her that I did not think she was in any danger needing of a shot.  Recommend continued conservative care with Tylenol and Aleve especially when she exercises.

## 2017-12-08 NOTE — Patient Instructions (Signed)
Continue to use of Tylenol but then also take 2 Aleve before you exercise

## 2018-02-01 DIAGNOSIS — Z1231 Encounter for screening mammogram for malignant neoplasm of breast: Secondary | ICD-10-CM | POA: Diagnosis not present

## 2018-04-14 DIAGNOSIS — H5440 Blindness, one eye, unspecified eye: Secondary | ICD-10-CM | POA: Diagnosis not present

## 2018-04-14 DIAGNOSIS — H538 Other visual disturbances: Secondary | ICD-10-CM | POA: Diagnosis not present

## 2018-04-14 DIAGNOSIS — H3122 Choroidal dystrophy (central areolar) (generalized) (peripapillary): Secondary | ICD-10-CM | POA: Diagnosis not present

## 2018-04-20 NOTE — Progress Notes (Signed)
[facility] Note  04/21/2018     CHIEF COMPLAINT Patient presents for Retina Evaluation   HISTORY OF PRESENT ILLNESS: Kathy [name] is a 67 y.o. female who presents to the clinic today for:    HPI    Retina Evaluation    In both eyes.  Associated Symptoms Blind Spot, Floaters and Flashes.  Negative for Glare, Shoulder/Hip pain, Fatigue, Jaw Claudication, Photophobia, Distortion, Redness, Scalp Tenderness, Weight Loss, Pain, Trauma and Fever.  Context:  distance vision, mid-range vision and near vision.  Treatments tried include surgery.  Response to treatment was no improvement.  I, the attending physician,  performed the HPI with the patient and updated documentation appropriately.          Comments    Referral of Dr. Frederico Hamman with [facility] For retina evaluation . Patient states she is legally blind OS, for the last month her "eye feels like something is in it"Pt reports for the last 2-3 yrs she has had occasional flashes ou. Pt is usig Restasis Bid, pt is taking PreserVision Bid.       Last edited by Bernarda Caffey, MD on 04/21/2018 10:13 AM. (History)    Pt states she has had muscle sx with Dr. Frederico Hamman, pt states Dr. Frederico Hamman sent her here because he thinks something may be going on with her left eye, pt states her left eye has been bad for 10+ years, pt states she has seen Dr. Zadie Rhine previously and he told her that she may have macular degeneration, pt states she saw Dr. Zadie Rhine about 2 years ago, she saw him before she had the sx with Dr. Frederico Hamman, pt states objects seem to "bunch up" and then drift apart and she has to wait for them to come back together so he can see them, pt states [facility] has been CF in her left eye for 10-20 years, pt states right eye is her "best eye" and she doesn't remember whether Dr. Zadie Rhine told her that her right eye is the same as the left eye    Referring physician: Gevena Cotton, MD [place], [place] [address]  HISTORICAL INFORMATION:   Selected notes from the MEDICAL RECORD NUMBER Referred by Dr. Gevena Cotton for concern of central areolar choroidal dystrophy LEE: 05.16.19 (MFrederico Hamman) [BCVA: OD: 20/30-2 OS: CF] Ocular Hx-Central areolar choroidal dystrophy, Macular Degeneration, exotropia OS, cataracts OU, dry eyes PMH-HTN, Fibromyalgia     CURRENT MEDICATIONS: Current Outpatient Medications (Ophthalmic Drugs)  Medication Sig  . cycloSPORINE (RESTASIS) 0.05 % ophthalmic emulsion Place 1 drop into both eyes 2 (two) times daily.  . prednisoLONE acetate (PRED FORTE) 1 % ophthalmic suspension Place 1 drop into the right eye 4 (four) times daily for 7 days.   No current facility-administered medications for this visit.  (Ophthalmic Drugs)   Current Outpatient Medications (Other)  Medication Sig  . albuterol (PROVENTIL HFA;VENTOLIN HFA) 108 (90 Base) MCG/ACT inhaler Inhale 2 puffs into the lungs every 6 (six) hours as needed for wheezing or shortness of breath.  . meloxicam (MOBIC) 7.5 MG tablet TAKE 1 TABLET EVERY DAY  . mirabegron ER (MYRBETRIQ) 25 MG TB24 tablet Take 1 tablet (25 mg total) by mouth daily.  . Multiple Vitamins-Minerals (PRESERVISION/LUTEIN) CAPS Take by mouth.  . NONFORMULARY OR COMPOUNDED ITEM Apply 1-2 g topically daily. [employer] Nail lacquer: Fluconazole 2%, Terbinafine 1%, DMSO  . Olmesartan-Amlodipine-HCTZ 40-10-12.5 MG TABS TAKE 1 TABLET EVERY DAY  .  tolterodine (DETROL LA) 4 MG 24 hr capsule Take 1 capsule (4 mg total) by mouth daily.   Current Facility-Administered Medications (Other)  Medication Route  . 0.9 %  sodium chloride infusion Intravenous      REVIEW OF SYSTEMS: ROS    Positive for: Eyes   Negative for: Constitutional, Gastrointestinal, Neurological, Skin, Genitourinary, Musculoskeletal, HENT, Endocrine, Cardiovascular, Respiratory, Psychiatric, Allergic/Imm, Heme/Lymph   Last edited by Zenovia Jordan, LPN on 0/35/0093  8:18  AM. (History)       ALLERGIES Allergies  Allergen Reactions  . Lortab [Hydrocodone-Acetaminophen] Nausea And Vomiting  . Latex     Pt unsure of reaction!    PAST MEDICAL HISTORY Past Medical History:  Diagnosis Date  . Exotropia of left eye   . Fibromyalgia   . History of uterine leiomyoma   . Hypertension   . Macular degeneration   . OA (osteoarthritis)   . Overactive bladder   . Sarcoidosis   . Wears glasses    Past Surgical History:  Procedure Laterality Date  . ABDOMINAL HYSTERECTOMY  2013  . ADJUSTABLE SUTURE MANIPULATION Left 04/10/2015   Procedure: ADJUSTABLE SUTURE MANIPULATION;  Surgeon: Gevena Cotton, MD;  Location: [facility];  Service: Ophthalmology;  Laterality: Left;  . EYE SURGERY    . MEDIAN RECTUS REPAIR Left 04/10/2015   Procedure: MEDIAN RECTUS RESECTION LATERAL RECTUS RECESSION,AJUSTABLES SUTURES LEFT EYE;  Surgeon: Gevena Cotton, MD;  Location: [facility];  Service: Ophthalmology;  Laterality: Left;  . TONSILLECTOMY  as child  . TOTAL ABDOMINAL HYSTERECTOMY W/ BILATERAL SALPINGOOPHORECTOMY  10-14-2010   and TVT midurethral sling and culdoplasty    FAMILY HISTORY Family History  Problem Relation Age of Onset  . Colon cancer Mother   . Diabetes Father   . Diabetes Sister   . Hypertension Sister   . Diabetes Sister   . Hypertension Sister   . Hypertension Sister     SOCIAL HISTORY Social History   Tobacco Use  . Smoking status: Never Smoker  . Smokeless tobacco: Never Used  Substance Use Topics  . Alcohol use: Yes    Alcohol/week: 0.6 oz    Types: 1 Glasses of wine per week    Comment: rare  . Drug use: No         OPHTHALMIC EXAM:  Base Eye Exam    Visual Acuity (Snellen - Linear)      Right Left   Dist cc 20/30 CF @ 2'   Dist ph cc 20/30 CF @ 2'   Correction:  Glasses       Tonometry (Tonopen, 9:35 AM)      Right Left   Pressure 11 12       Pupils      Dark Light Shape React APD    Right 4 3 Round Brisk None   Left 4 3 Round Brisk None       Visual Fields (Counting fingers)      Left Right    Full Full       Extraocular Movement      Right Left    Full, Ortho Full, Ortho       Neuro/Psych    Oriented x3:  Yes       Dilation    Both eyes:  1.0% Mydriacyl, 2.5% Phenylephrine @ 9:35 AM        Slit Lamp and Fundus Exam    Slit Lamp Exam      Right Left   Lids/Lashes  Dermatochalasis - upper lid Dermatochalasis - upper lid   Conjunctiva/Sclera mild Melanosis mild Melanosis   Cornea Arcus Arcus, linear scar at 0430   Anterior Chamber Deep and quiet Deep and quiet   Iris Round and dilated Round and dilated   Lens 2+ Cortical cataract, 2+ Nuclear sclerosis 2+ Nuclear sclerosis, 2+ Cortical cataract, Vacuoles   Vitreous Vitreous syneresis, Posterior vitreous detachment Vitreous syneresis       Fundus Exam      Right Left   Disc Pink and Sharp Tilted disc, almost 360 degrees of PPA   C/D Ratio 0.2 0.4   Macula large area of RPE and chorodial atrophy with small central foveal island spared large area of choroidal and retinal pigment epithelial atrophy extending to nasal side of disc, Pigment clumping, No heme    Vessels AV crossing changes, Vascular attenuation, Vascular attenuation   Periphery Attached,  vitreous condensations at 0300 with retinal break and +SRF / focal RD  Attached           Refraction    Wearing Rx      Sphere Cylinder Axis Add   Right +0.25 +0.50 052 +2.50   Left +0.25 Sphere  +2.50   Age:  1 yr   Type:  PAL       Manifest Refraction      Sphere Cylinder Axis Dist [facility]   Right +0.25 +0.50 052 20/30   Left [place] Sphere  CF @ 2'          IMAGING AND PROCEDURES  Imaging and Procedures for @TODAY @  OCT, Retina - OU - Both Eyes       Right Eye Quality was good. Central Foveal Thickness: 206. Progression has no prior data. Findings include abnormal foveal contour, inner retinal atrophy, outer retinal atrophy, no SRF, no  IRF, retinal drusen  (Diffuse geographic atrophy with small central island spared).   Left Eye Quality was good. Central Foveal Thickness: 212. Progression has no prior data. Findings include outer retinal atrophy, inner retinal atrophy, abnormal foveal contour, no IRF, no SRF (Diffuse center atrophy).   Notes *Images captured and stored on drive  Diagnosis / Impression:  Diffuse atrophy OU OD with central island of non-atrophied retina Consistent with central areolar choroidal dystrophy  Clinical management:  See below  Abbreviations: NFP - Normal foveal profile. CME - cystoid macular edema. PED - pigment epithelial detachment. IRF - intraretinal fluid. SRF - subretinal fluid. EZ - ellipsoid zone. ERM - epiretinal membrane. ORA - outer retinal atrophy. ORT - outer retinal tubulation. SRHM - subretinal hyper-reflective material        Repair Retinal Detach, Photocoag - OD - Right Eye       LASER PROCEDURE NOTE  Procedure:  Barrier laser retinopexy using slit lamp laser, RIGHT eye   Diagnosis:   Retinal tear with +subretinal fluid / focal retinal detachment, RIGHT eye                     Flap tear at 330 o'clock anterior to equator with fluid tracking posteriorly  Surgeon: Bernarda Caffey, MD, PhD  Anesthesia: Topical  Informed consent obtained, operative eye marked, and time out performed prior to initiation of laser.   Laser settings:  Lumenis Smart532 laser, slit lamp Lens: Mainster PRP 165 Power: 260 mW Spot size: 200 microns Duration: 50 msec  # spots: 375  Placement of laser: Using a Mainster PRP 165 contact lens at the slit lamp, laser was placed in  three confluent rows around focal detachment nasally at 0300 with additional rows anteriorly. Anterior laser to ora was completed using indirect ophthalmoscope. 442 spots at 200 mW, 70 ms duration.  Complications: None.  Patient tolerated the procedure well and received written and verbal post-procedure care  information/education.                  ASSESSMENT/PLAN:    ICD-10-CM   1. Retinal detachment, right H33.21 Repair Retinal Detach, Photocoag - OD - Right Eye  2. Central areolar choroidal dystrophy H31.22   3. Choroidal atrophy of both eyes H31.103   4. Advanced atrophic nonexudative age-related macular degeneration of both eyes with subfoveal involvement H35.3134   5. Retinal edema H35.81 OCT, Retina - OU - Both Eyes  6. Combined form of age-related cataract, both eyes H25.813     1. Localized, focal retinal detachment / SRF surrounding small retinal tear at 330, RIGHT EYE - asymptomatic, but found on exam - discussed findings and prognosis - Potential treatment options including laser retinopexy and cryotherapy, PPV and scleral buckle discussed with patient. - tear located at 330 OD - recommend laser retinopexy OD - RBA of procedure discussed, questions answered - informed consent obtained and signed - see procedure note - start PF QID x7 days - f/u in 1 wk  2-4. Severe outer retinal and choroidal atrophy OU - likely central areolar choroidal dystrophy -- pt notes declining vision that started in her 53-50s - differential also includes age related macular degeneration, non-exudative, both eyes  - The incidence, anatomy, and pathology of dry AMD, risk of progression, and the AREDS and AREDS 2 study including smoking risks discussed with patient.  - Recommend amsler grid monitoring  5. No retinal edema on exam or OCT  6. Combined form of age-related cataracts OU - The symptoms of cataract, surgical options, and treatments and risks were discussed with patient. - discussed diagnosis and progression - not yet visually significant - monitor for now  7. Strabismus - under the expert management of Dr. Frederico Hamman - s/p EOM surgery 04/10/15 - monitor  Ophthalmic Meds Ordered this visit:  Meds ordered this encounter  Medications  . prednisoLONE acetate (PRED FORTE) 1 %  ophthalmic suspension    Sig: Place 1 drop into the right eye 4 (four) times daily for 7 days.    Dispense:  10 mL    Refill:  0       Return in about 1 week (around 04/28/2018) for POV.  There are no Patient Instructions on file for this visit.   Explained the diagnoses, plan, and follow up with the patient and they expressed understanding.  Patient expressed understanding of the importance of proper follow up care.   This document serves as a record of services personally performed by Gardiner Sleeper, MD, PhD. It was created on their behalf by Ernest Mallick, OA, an ophthalmic assistant. The creation of this record is the provider's dictation and/or activities during the visit.    Electronically signed by: Ernest Mallick, OA  04/20/2018 1:05 AM    Gardiner Sleeper, M.D., Ph.D. Diseases & Surgery of the Retina and Vitreous [facility]   I have reviewed the above documentation for accuracy and completeness, and I agree with the above. Gardiner Sleeper, M.D., Ph.D. 04/25/18 1:13 AM   Abbreviations: M myopia (nearsighted); A astigmatism; H hyperopia (farsighted); P presbyopia; Mrx spectacle prescription;  CTL contact lenses; OD right eye; OS left eye; OU  both eyes  XT exotropia; ET esotropia; PEK punctate epithelial keratitis; PEE punctate epithelial erosions; DES dry eye syndrome; MGD meibomian gland dysfunction; ATs artificial tears; PFAT's preservative free artificial tears; [place] nuclear sclerotic cataract; PSC posterior subcapsular cataract; ERM epi-retinal membrane; PVD posterior vitreous detachment; RD retinal detachment; DM diabetes mellitus; DR diabetic retinopathy; NPDR non-proliferative diabetic retinopathy; PDR proliferative diabetic retinopathy; CSME clinically significant macular edema; DME diabetic macular edema; dbh dot blot hemorrhages; CWS cotton wool spot; POAG primary open angle glaucoma; C/D cup-to-disc ratio; HVF humphrey visual field; GVF goldmann  visual field; OCT optical coherence tomography; IOP intraocular pressure; BRVO Branch retinal vein occlusion; CRVO central retinal vein occlusion; CRAO central retinal artery occlusion; BRAO branch retinal artery occlusion; RT retinal tear; SB scleral buckle; PPV pars plana vitrectomy; VH Vitreous hemorrhage; PRP panretinal laser photocoagulation; IVK intravitreal kenalog; VMT vitreomacular traction; MH Macular hole;  NVD neovascularization of the disc; NVE neovascularization elsewhere; AREDS age related eye disease study; ARMD age related macular degeneration; POAG primary open angle glaucoma; EBMD epithelial/anterior basement membrane dystrophy; ACIOL anterior chamber intraocular lens; IOL intraocular lens; PCIOL posterior chamber intraocular lens; Phaco/IOL phacoemulsification with intraocular lens placement; [place] photorefractive keratectomy; LASIK laser assisted in situ keratomileusis; HTN hypertension; DM diabetes mellitus; COPD chronic obstructive pulmonary disease

## 2018-04-21 ENCOUNTER — Ambulatory Visit (INDEPENDENT_AMBULATORY_CARE_PROVIDER_SITE_OTHER): Payer: Medicare Other | Admitting: Ophthalmology

## 2018-04-21 ENCOUNTER — Encounter (INDEPENDENT_AMBULATORY_CARE_PROVIDER_SITE_OTHER): Payer: Self-pay | Admitting: Ophthalmology

## 2018-04-21 DIAGNOSIS — H3122 Choroidal dystrophy (central areolar) (generalized) (peripapillary): Secondary | ICD-10-CM | POA: Diagnosis not present

## 2018-04-21 DIAGNOSIS — H353134 Nonexudative age-related macular degeneration, bilateral, advanced atrophic with subfoveal involvement: Secondary | ICD-10-CM | POA: Diagnosis not present

## 2018-04-21 DIAGNOSIS — H509 Unspecified strabismus: Secondary | ICD-10-CM | POA: Diagnosis not present

## 2018-04-21 DIAGNOSIS — H31103 Choroidal degeneration, unspecified, bilateral: Secondary | ICD-10-CM

## 2018-04-21 DIAGNOSIS — H3321 Serous retinal detachment, right eye: Secondary | ICD-10-CM | POA: Diagnosis not present

## 2018-04-21 DIAGNOSIS — H25813 Combined forms of age-related cataract, bilateral: Secondary | ICD-10-CM | POA: Diagnosis not present

## 2018-04-21 DIAGNOSIS — H3581 Retinal edema: Secondary | ICD-10-CM | POA: Diagnosis not present

## 2018-04-21 MED ORDER — PREDNISOLONE ACETATE 1 % OP SUSP
1.0000 [drp] | Freq: Four times a day (QID) | OPHTHALMIC | 0 refills | Status: AC
Start: 2018-04-21 — End: 2018-04-28

## 2018-04-25 ENCOUNTER — Encounter (INDEPENDENT_AMBULATORY_CARE_PROVIDER_SITE_OTHER): Payer: Self-pay | Admitting: Ophthalmology

## 2018-04-27 NOTE — Progress Notes (Addendum)
[facility] Note  04/28/2018     CHIEF COMPLAINT Patient presents for Post-op Follow-up   HISTORY OF PRESENT ILLNESS: Kathy [name] is a 67 y.o. female who presents to the clinic today for:    HPI    Post-op Follow-up    In right eye.  Discomfort includes none.  Vision is improved.  I, the attending physician,  performed the HPI with the patient and updated documentation appropriately.          Comments    POV Laser retinopexy OD(04/21/18) Patient states she "occasionally has flashes OD, but they are not as often".Pt received new Rx glasses yesterday, her vision is better with her new rx glasses. Pt is using Pred Forte Od QiD, and Restasis PRN       Last edited by Bernarda Caffey, MD on 04/28/2018  9:32 AM. (History)    Pt states she noticed "some flashes" after the laser but is unsure of when that was;   Referring physician: Denita Lung, MD [address] [place], [place] [address]  HISTORICAL INFORMATION:   Selected notes from the MEDICAL RECORD NUMBER Referred by Dr. Gevena Cotton for concern of central areolar choroidal dystrophy LEE: 05.16.19 (M. Frederico Hamman) [BCVA: OD: 20/30-2 OS: CF] Ocular Hx-Central areolar choroidal dystrophy, Macular Degeneration, exotropia OS, cataracts OU, dry eyes PMH-HTN, Fibromyalgia     CURRENT MEDICATIONS: Current Outpatient Medications (Ophthalmic Drugs)  Medication Sig  . cycloSPORINE (RESTASIS) 0.05 % ophthalmic emulsion Place 1 drop into both eyes 2 (two) times daily.   No current facility-administered medications for this visit.  (Ophthalmic Drugs)   Current Outpatient Medications (Other)  Medication Sig  . albuterol (PROVENTIL HFA;VENTOLIN HFA) 108 (90 Base) MCG/ACT inhaler Inhale 2 puffs into the lungs every 6 (six) hours as needed for wheezing or shortness of breath.  . meloxicam (MOBIC) 7.5 MG tablet TAKE 1 TABLET EVERY DAY  . mirabegron ER (MYRBETRIQ) 25 MG TB24 tablet Take 1 tablet (25  mg total) by mouth daily.  . Multiple Vitamins-Minerals (PRESERVISION/LUTEIN) CAPS Take by mouth.  . NONFORMULARY OR COMPOUNDED ITEM Apply 1-2 g topically daily. [employer] Nail lacquer: Fluconazole 2%, Terbinafine 1%, DMSO  . Olmesartan-Amlodipine-HCTZ 40-10-12.5 MG TABS TAKE 1 TABLET EVERY DAY  . tolterodine (DETROL LA) 4 MG 24 hr capsule Take 1 capsule (4 mg total) by mouth daily.   Current Facility-Administered Medications (Other)  Medication Route  . 0.9 %  sodium chloride infusion Intravenous      REVIEW OF SYSTEMS: ROS    Positive for: Eyes   Negative for: Constitutional, Gastrointestinal, Neurological, Skin, Genitourinary, Musculoskeletal, HENT, Endocrine, Cardiovascular, Respiratory, Psychiatric, Allergic/Imm, Heme/Lymph   Last edited by Zenovia Jordan, LPN on 01/11/9416  4:08 AM. (History)       ALLERGIES Allergies  Allergen Reactions  . Lortab [Hydrocodone-Acetaminophen] Nausea And Vomiting  . Latex     Pt unsure of reaction!    PAST MEDICAL HISTORY Past Medical History:  Diagnosis Date  . Exotropia of left eye   . Fibromyalgia   . History of uterine leiomyoma   . Hypertension   . Macular degeneration   . OA (osteoarthritis)   . Overactive bladder   . Sarcoidosis   . Wears glasses    Past Surgical History:  Procedure Laterality Date  . ABDOMINAL HYSTERECTOMY  2013  . ADJUSTABLE SUTURE MANIPULATION Left 04/10/2015   Procedure: ADJUSTABLE SUTURE MANIPULATION;  Surgeon: Gevena Cotton, MD;  Location: [facility];  Service: Ophthalmology;  Laterality: Left;  . EYE SURGERY    . MEDIAN RECTUS REPAIR Left 04/10/2015   Procedure: MEDIAN RECTUS RESECTION LATERAL RECTUS RECESSION,AJUSTABLES SUTURES LEFT EYE;  Surgeon: Gevena Cotton, MD;  Location: [facility];  Service: Ophthalmology;  Laterality: Left;  . TONSILLECTOMY  as child  . TOTAL ABDOMINAL HYSTERECTOMY W/ BILATERAL SALPINGOOPHORECTOMY  10-14-2010   and TVT midurethral sling  and culdoplasty    FAMILY HISTORY Family History  Problem Relation Age of Onset  . Colon cancer Mother   . Diabetes Father   . Diabetes Sister   . Hypertension Sister   . Diabetes Sister   . Hypertension Sister   . Hypertension Sister     SOCIAL HISTORY Social History   Tobacco Use  . Smoking status: Never Smoker  . Smokeless tobacco: Never Used  Substance Use Topics  . Alcohol use: Yes    Alcohol/week: 0.6 oz    Types: 1 Glasses of wine per week    Comment: rare  . Drug use: No         OPHTHALMIC EXAM:  Base Eye Exam    Visual Acuity (Snellen - Linear)      Right Left   Dist cc 20/30 CF at 3'   Dist ph cc NI    Correction:  Glasses       Tonometry (Tonopen, 9:15 AM)      Right Left   Pressure 18 19       Pupils      Dark Light Shape React APD   Right 5 4 Round Slow None   Left 5 4 Round Slow None       Visual Fields (Counting fingers)      Left Right    Full Full       Extraocular Movement      Right Left    Full, Ortho Full, Ortho       Neuro/Psych    Oriented x3:  Yes       Dilation    Both eyes:  1.0% Mydriacyl, Paremyd @ 9:15 AM        Slit Lamp and Fundus Exam    Slit Lamp Exam      Right Left   Lids/Lashes Dermatochalasis - upper lid Dermatochalasis - upper lid   Conjunctiva/Sclera mild Melanosis mild Melanosis   Cornea Arcus Arcus, linear scar at 0430   Anterior Chamber Deep and quiet Deep and quiet   Iris Round and dilated Round and dilated   Lens 2+ Cortical cataract, 2+ Nuclear sclerosis 2+ Nuclear sclerosis, 2+ Cortical cataract, Vacuoles   Vitreous Vitreous syneresis, Posterior vitreous detachment Vitreous syneresis       Fundus Exam      Right Left   Disc Pink and Sharp Tilted disc, almost 360 degrees of PPA   C/D Ratio 0.2 0.4   Macula large area of RPE and chorodial atrophy with small central foveal island spared large area of choroidal and retinal pigment epithelial atrophy extending to nasal side of disc,  Pigment clumping, No heme    Vessels AV crossing changes, Vascular attenuation, Vascular attenuation   Periphery Attached,  vitreous condensations at 0300 with retinal break and +SRF / focal RD -- early laser changes  Attached             IMAGING AND PROCEDURES  Imaging and Procedures for @TODAY @  OCT, Retina - OU - Both Eyes       Right Eye Quality was good. Central Foveal  Thickness: 194. Progression has been stable. Findings include abnormal foveal contour, inner retinal atrophy, outer retinal atrophy, no SRF, no IRF, retinal drusen  (Diffuse geographic atrophy with small central island spared, peripheral retinoschisis nasally caught on wide field OCT).   Left Eye Quality was good. Central Foveal Thickness: 206. Progression has been stable. Findings include outer retinal atrophy, inner retinal atrophy, abnormal foveal contour, no IRF, no SRF (Diffuse center atrophy).   Notes *Images captured and stored on drive  Diagnosis / Impression:  Diffuse atrophy OU OD with central island of non-atrophied retina, peripheral retinoschisis nasally caught on wide field OCT Central areolar choroidal dystrophy OU  Clinical management:  See below  Abbreviations: NFP - Normal foveal profile. CME - cystoid macular edema. PED - pigment epithelial detachment. IRF - intraretinal fluid. SRF - subretinal fluid. EZ - ellipsoid zone. ERM - epiretinal membrane. ORA - outer retinal atrophy. ORT - outer retinal tubulation. SRHM - subretinal hyper-reflective material                 ASSESSMENT/PLAN:    ICD-10-CM   1. Retinal detachment, right H33.21 OCT, Retina - OU - Both Eyes  2. Right retinoschisis H33.101 OCT, Retina - OU - Both Eyes  3. 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 Choroidal atrophy of both eyes H31.103   5. Advanced atrophic nonexudative age-related macular degeneration of both eyes with subfoveal involvement H35.3134   6. Retinal edema H35.81 OCT, Retina - OU - Both  Eyes  7. Combined form of age-related cataract, both eyes H25.813   8. Strabismus H50.9     1,2. Localized, focal retinal detachment / SRF surrounding small retinal tear at 330, within retinoschisis cavity, RIGHT EYE - asymptomatic, but found on exam - tear located at 0330 OD - S/P laser retinopexy OD (05.23.19) -- good early barrier laser changes - no change in SRF/schisis-detachment - finished PF QID x7 days - f/u in 1-2 wks  3-5. Severe outer retinal and choroidal atrophy OU - likely central areolar choroidal dystrophy -- pt notes declining vision that started in her 64-50s - differential also includes age related macular degeneration, non-exudative, both eyes  - The incidence, anatomy, and pathology of dry AMD, risk of progression, and the AREDS and AREDS 2 study including smoking risks discussed with patient.  - continue amsler grid monitoring  6. No retinal edema on exam or OCT   7. Combined form of age-related cataracts OU - The symptoms of cataract, surgical options, and treatments and risks were discussed with patient. - discussed diagnosis and progression - not yet visually significant - monitor for now  8. Strabismus - under the expert management of Dr. Frederico Hamman - s/p EOM surgery 04/10/15 - monitor  Ophthalmic Meds Ordered this visit:  No orders of the defined types were placed in this encounter.      Return in about 8 days (around 05/06/2018) for POV.  There are no Patient Instructions on file for this visit.   Explained the diagnoses, plan, and follow up with the patient and they expressed understanding.  Patient expressed understanding of the importance of proper follow up care.   This document serves as a record of services personally performed by Gardiner Sleeper, MD, PhD. It was created on their behalf by Catha Brow, [place], a certified ophthalmic assistant. The creation of this record is the provider's dictation and/or activities during the  visit.  Electronically signed by: Catha Brow, [place]  05.29.19 9:22 AM    Aaron Edelman  Antony Haste, M.D., Ph.D. Diseases & Surgery of the Retina and [place] 04/28/2018   I have reviewed the above documentation for accuracy and completeness, and I agree with the above. Gardiner Sleeper, M.D., Ph.D. 04/29/18 9:24 AM    Abbreviations: M myopia (nearsighted); A astigmatism; H hyperopia (farsighted); P presbyopia; Mrx spectacle prescription;  CTL contact lenses; OD right eye; OS left eye; OU both eyes  XT exotropia; ET esotropia; PEK punctate epithelial keratitis; PEE punctate epithelial erosions; DES dry eye syndrome; MGD meibomian gland dysfunction; ATs artificial tears; PFAT's preservative free artificial tears; [place] nuclear sclerotic cataract; PSC posterior subcapsular cataract; ERM epi-retinal membrane; PVD posterior vitreous detachment; RD retinal detachment; DM diabetes mellitus; DR diabetic retinopathy; NPDR non-proliferative diabetic retinopathy; PDR proliferative diabetic retinopathy; CSME clinically significant macular edema; DME diabetic macular edema; dbh dot blot hemorrhages; CWS cotton wool spot; POAG primary open angle glaucoma; C/D cup-to-disc ratio; HVF humphrey visual field; GVF goldmann visual field; OCT optical coherence tomography; IOP intraocular pressure; BRVO Branch retinal vein occlusion; CRVO central retinal vein occlusion; CRAO central retinal artery occlusion; BRAO branch retinal artery occlusion; RT retinal tear; SB scleral buckle; PPV pars plana vitrectomy; VH Vitreous hemorrhage; PRP panretinal laser photocoagulation; IVK intravitreal kenalog; VMT vitreomacular traction; MH Macular hole;  NVD neovascularization of the disc; NVE neovascularization elsewhere; AREDS age related eye disease study; ARMD age related macular degeneration; POAG primary open angle glaucoma; EBMD epithelial/anterior basement membrane dystrophy; ACIOL anterior chamber  intraocular lens; IOL intraocular lens; PCIOL posterior chamber intraocular lens; Phaco/IOL phacoemulsification with intraocular lens placement; [place] photorefractive keratectomy; LASIK laser assisted in situ keratomileusis; HTN hypertension; DM diabetes mellitus; COPD chronic obstructive pulmonary disease

## 2018-04-28 ENCOUNTER — Ambulatory Visit (INDEPENDENT_AMBULATORY_CARE_PROVIDER_SITE_OTHER): Payer: Medicare Other | Admitting: Ophthalmology

## 2018-04-28 ENCOUNTER — Encounter (INDEPENDENT_AMBULATORY_CARE_PROVIDER_SITE_OTHER): Payer: Self-pay | Admitting: Ophthalmology

## 2018-04-28 DIAGNOSIS — H31103 Choroidal degeneration, unspecified, bilateral: Secondary | ICD-10-CM

## 2018-04-28 DIAGNOSIS — H509 Unspecified strabismus: Secondary | ICD-10-CM

## 2018-04-28 DIAGNOSIS — H3581 Retinal edema: Secondary | ICD-10-CM

## 2018-04-28 DIAGNOSIS — H3122 Choroidal dystrophy (central areolar) (generalized) (peripapillary): Secondary | ICD-10-CM

## 2018-04-28 DIAGNOSIS — H25813 Combined forms of age-related cataract, bilateral: Secondary | ICD-10-CM

## 2018-04-28 DIAGNOSIS — H33101 Unspecified retinoschisis, right eye: Secondary | ICD-10-CM | POA: Diagnosis not present

## 2018-04-28 DIAGNOSIS — H3321 Serous retinal detachment, right eye: Secondary | ICD-10-CM | POA: Diagnosis not present

## 2018-04-28 DIAGNOSIS — H353134 Nonexudative age-related macular degeneration, bilateral, advanced atrophic with subfoveal involvement: Secondary | ICD-10-CM

## 2018-04-29 ENCOUNTER — Encounter (INDEPENDENT_AMBULATORY_CARE_PROVIDER_SITE_OTHER): Payer: Self-pay | Admitting: Ophthalmology

## 2018-05-03 ENCOUNTER — Encounter: Payer: Self-pay | Admitting: Podiatry

## 2018-05-03 ENCOUNTER — Ambulatory Visit (INDEPENDENT_AMBULATORY_CARE_PROVIDER_SITE_OTHER): Payer: Medicare Other

## 2018-05-03 ENCOUNTER — Ambulatory Visit: Payer: Medicare Other | Admitting: Podiatry

## 2018-05-03 DIAGNOSIS — B351 Tinea unguium: Secondary | ICD-10-CM | POA: Diagnosis not present

## 2018-05-03 DIAGNOSIS — M79674 Pain in right toe(s): Secondary | ICD-10-CM | POA: Diagnosis not present

## 2018-05-03 DIAGNOSIS — M21619 Bunion of unspecified foot: Secondary | ICD-10-CM | POA: Diagnosis not present

## 2018-05-03 DIAGNOSIS — M722 Plantar fascial fibromatosis: Secondary | ICD-10-CM

## 2018-05-03 DIAGNOSIS — M79675 Pain in left toe(s): Secondary | ICD-10-CM

## 2018-05-03 MED ORDER — TRIAMCINOLONE ACETONIDE 10 MG/ML IJ SUSP
10.0000 mg | Freq: Once | INTRAMUSCULAR | Status: AC
  Administered 2018-05-03: 10 mg

## 2018-05-03 MED ORDER — METHYLPREDNISOLONE 4 MG PO TBPK: ORAL_TABLET | ORAL | 0 refills | Status: DC

## 2018-05-03 NOTE — Patient Instructions (Signed)

## 2018-05-05 NOTE — Progress Notes (Signed)
[facility] Note  05/06/2018     CHIEF COMPLAINT Patient presents for Post-op Follow-up   HISTORY OF PRESENT ILLNESS: Kathy [name] is a 68 y.o. female who presents to the clinic today for:    HPI    Post-op Follow-up    In right eye.  Discomfort includes Negative for pain, itching, foreign body sensation, tearing, discharge, floaters and none.  Vision is stable.  I, the attending physician,  performed the HPI with the patient and updated documentation appropriately.          Comments    S/P laser retinopexy OD (05.23.19); Pt states she continues to see flashing OD coming from temporal side; Pt states flashes last about 5 minutes and are not constant; Pt reports no pattern to flashing; Pt denies any floaters; Pt reports OU VA is stable;        Last edited by Bernarda Caffey, MD on 05/06/2018 10:03 AM. (History)    Pt states she noticed "some flashes" after the laser but is unsure of when that was;   Referring physician: Denita Lung, MD [address] [place], [place] [address]  HISTORICAL INFORMATION:   Selected notes from the MEDICAL RECORD NUMBER Referred by Dr. Gevena Cotton for concern of central areolar choroidal dystrophy LEE: 05.16.19 (M. Frederico Hamman) [BCVA: OD: 20/30-2 OS: CF] Ocular Hx-Central areolar choroidal dystrophy, Macular Degeneration, exotropia OS, cataracts OU, dry eyes PMH-HTN, Fibromyalgia     CURRENT MEDICATIONS: Current Outpatient Medications (Ophthalmic Drugs)  Medication Sig  . cycloSPORINE (RESTASIS) 0.05 % ophthalmic emulsion Place 1 drop into both eyes 2 (two) times daily.   No current facility-administered medications for this visit.  (Ophthalmic Drugs)   Current Outpatient Medications (Other)  Medication Sig  . albuterol (PROVENTIL HFA;VENTOLIN HFA) 108 (90 Base) MCG/ACT inhaler Inhale 2 puffs into the lungs every 6 (six) hours as needed for wheezing or shortness of breath. (Patient not taking: Reported  on 05/03/2018)  . meloxicam (MOBIC) 7.5 MG tablet TAKE 1 TABLET EVERY DAY (Patient not taking: Reported on 05/03/2018)  . methylPREDNISolone (MEDROL DOSEPAK) 4 MG TBPK tablet Take as directed  . mirabegron ER (MYRBETRIQ) 25 MG TB24 tablet Take 1 tablet (25 mg total) by mouth daily. (Patient not taking: Reported on 05/03/2018)  . Multiple Vitamins-Minerals (PRESERVISION/LUTEIN) CAPS Take by mouth.  . NONFORMULARY OR COMPOUNDED ITEM Apply 1-2 g topically daily. [employer] Nail lacquer: Fluconazole 2%, Terbinafine 1%, DMSO (Patient not taking: Reported on 05/03/2018)  . Olmesartan-Amlodipine-HCTZ 40-10-12.5 MG TABS TAKE 1 TABLET EVERY DAY  . tolterodine (DETROL LA) 4 MG 24 hr capsule Take 1 capsule (4 mg total) by mouth daily.   Current Facility-Administered Medications (Other)  Medication Route  . 0.9 %  sodium chloride infusion Intravenous      REVIEW OF SYSTEMS: ROS    Positive for: Musculoskeletal, Cardiovascular, Eyes   Negative for: Constitutional, Gastrointestinal, Neurological, Skin, Genitourinary, HENT, Endocrine, Respiratory, Psychiatric, Allergic/Imm, Heme/Lymph   Last edited by Cherrie Gauze, COA on 05/06/2018  9:32 AM. (History)       ALLERGIES Allergies  Allergen Reactions  . Lortab [Hydrocodone-Acetaminophen] Nausea And Vomiting  . Latex     Pt unsure of reaction!    PAST MEDICAL HISTORY Past Medical History:  Diagnosis Date  . Exotropia of left eye   . Fibromyalgia   . History of uterine leiomyoma   . Hypertension   . Macular degeneration   . OA (osteoarthritis)   . Overactive bladder   .  Sarcoidosis   . Wears glasses    Past Surgical History:  Procedure Laterality Date  . ABDOMINAL HYSTERECTOMY  2013  . ADJUSTABLE SUTURE MANIPULATION Left 04/10/2015   Procedure: ADJUSTABLE SUTURE MANIPULATION;  Surgeon: Gevena Cotton, MD;  Location: [facility];  Service: Ophthalmology;  Laterality: Left;  . EYE SURGERY    . MEDIAN RECTUS REPAIR Left  04/10/2015   Procedure: MEDIAN RECTUS RESECTION LATERAL RECTUS RECESSION,AJUSTABLES SUTURES LEFT EYE;  Surgeon: Gevena Cotton, MD;  Location: [facility];  Service: Ophthalmology;  Laterality: Left;  . TONSILLECTOMY  as child  . TOTAL ABDOMINAL HYSTERECTOMY W/ BILATERAL SALPINGOOPHORECTOMY  10-14-2010   and TVT midurethral sling and culdoplasty    FAMILY HISTORY Family History  Problem Relation Age of Onset  . Colon cancer Mother   . Diabetes Father   . Diabetes Sister   . Hypertension Sister   . Diabetes Sister   . Hypertension Sister   . Hypertension Sister     SOCIAL HISTORY Social History   Tobacco Use  . Smoking status: Never Smoker  . Smokeless tobacco: Never Used  Substance Use Topics  . Alcohol use: Yes    Alcohol/week: 0.6 oz    Types: 1 Glasses of wine per week    Comment: rare  . Drug use: No         OPHTHALMIC EXAM:  Base Eye Exam    Visual Acuity (Snellen - Linear)      Right Left   Dist [place] 20/40 CF @ 1'   Dist ph [place] 20/30 NI   Correction:  Glasses       Tonometry (Tonopen, 9:41 AM)      Right Left   Pressure 15 15       Pupils      Dark Light Shape React APD   Right 5 4 Round Slow None   Left 5 4 Round Sluggish None       Neuro/Psych    Oriented x3:  Yes   Mood/Affect:  Normal       Dilation    Right eye:  1.0% Mydriacyl, 2.5% Phenylephrine @ 9:41 AM        Slit Lamp and Fundus Exam    Slit Lamp Exam      Right Left   Lids/Lashes Dermatochalasis - upper lid Dermatochalasis - upper lid   Conjunctiva/Sclera mild Melanosis mild Melanosis   Cornea Arcus Arcus, linear scar at 0430   Anterior Chamber Deep and quiet Deep and quiet   Iris Round and dilated Round and dilated   Lens 2+ Cortical cataract, 2+ Nuclear sclerosis 2+ Nuclear sclerosis, 2+ Cortical cataract, Vacuoles   Vitreous Vitreous syneresis, Posterior vitreous detachment Vitreous syneresis       Fundus Exam      Right Left   Disc Pink and Sharp     C/D Ratio 0.2 0.4   Macula large area of RPE and chorodial atrophy with small central foveal island spared    Vessels AV crossing changes, Vascular attenuation,    Periphery Attached,  vitreous condensations at 0300 with retinal break and +SRF / focal RD within bed of retinoschisis -- good laser surrounding, slow to scar in            IMAGING AND PROCEDURES  Imaging and Procedures for @TODAY @  OCT, Retina - OU - Both Eyes       Right Eye Quality was good. Central Foveal Thickness: 207. Progression has been stable. Findings include abnormal  foveal contour, inner retinal atrophy, outer retinal atrophy, no SRF, no IRF, retinal drusen  (Diffuse geographic atrophy with small central island spared, peripheral retinoschisis nasally caught on wide field OCT).   Left Eye Quality was good. Central Foveal Thickness: 209. Progression has been stable. Findings include outer retinal atrophy, inner retinal atrophy, abnormal foveal contour, no IRF, no SRF (Diffuse center atrophy).   Notes *Images captured and stored on drive  Diagnosis / Impression:  Diffuse atrophy OU OD with central island of non-atrophied retina Central areolar choroidal dystrophy OU  Clinical management:  See below  Abbreviations: NFP - Normal foveal profile. CME - cystoid macular edema. PED - pigment epithelial detachment. IRF - intraretinal fluid. SRF - subretinal fluid. EZ - ellipsoid zone. ERM - epiretinal membrane. ORA - outer retinal atrophy. ORT - outer retinal tubulation. SRHM - subretinal hyper-reflective material                 ASSESSMENT/PLAN:    ICD-10-CM   1. Retinal detachment, right H33.21 OCT, Retina - OU - Both Eyes  2. Right retinoschisis H33.101 OCT, Retina - OU - Both Eyes  3. Central areolar choroidal dystrophy H31.22 OCT, Retina - OU - Both Eyes  4. Choroidal atrophy of both eyes H31.103   5. Advanced atrophic nonexudative age-related macular degeneration of both eyes with subfoveal  involvement H35.3134   6. Retinal edema H35.81 OCT, Retina - OU - Both Eyes  7. Combined form of age-related cataract, both eyes H25.813   8. Strabismus H50.9     1,2. Localized, focal retinal detachment / SRF surrounding small retinal tear at 0330, within retinoschisis cavity, RIGHT EYE - asymptomatic, but found on exam - tear located at 0330 OD - S/P laser retinopexy OD (05.23.19) -- good barrier laser surrounding - slow to scar in - no change in SRF/schisis-detachment - finished PF QID x7 days - f/u in 3-4 wks, possible touch up laser retinopexy at that time  3-5. Severe outer retinal and choroidal atrophy OU - likely central areolar choroidal dystrophy -- pt notes declining vision that started in her 42-50s - differential also includes age related macular degeneration, non-exudative, both eyes  - The incidence, anatomy, and pathology of dry AMD, risk of progression, and the AREDS and AREDS 2 study including smoking risks discussed with patient.  - continue amsler grid monitoring  6. No retinal edema on exam or OCT   7. Combined form of age-related cataracts OU - The symptoms of cataract, surgical options, and treatments and risks were discussed with patient. - discussed diagnosis and progression - not yet visually significant - monitor for now  8. Strabismus - under the expert management of Dr. Frederico Hamman - s/p EOM surgery 04/10/15 - monitor  Ophthalmic Meds Ordered this visit:  No orders of the defined types were placed in this encounter.      Return in about 1 month (around 06/03/2018) for F/U laser retinopexy OD, DFE, OCT.  There are no Patient Instructions on file for this visit.   Explained the diagnoses, plan, and follow up with the patient and they expressed understanding.  Patient expressed understanding of the importance of proper follow up care.   This document serves as a record of services personally performed by Gardiner Sleeper, MD, PhD. It was created on  their behalf by Catha Brow, [place], a certified ophthalmic assistant. The creation of this record is the provider's dictation and/or activities during the visit.  Electronically signed by: Catha Brow, [place]  06.06.19  4:25 PM    Gardiner Sleeper, M.D., Ph.D. Diseases & Surgery of the Retina and Vitreous [facility]  I have reviewed the above documentation for accuracy and completeness, and I agree with the above. Gardiner Sleeper, M.D., Ph.D. 05/07/18 4:26 PM     Abbreviations: M myopia (nearsighted); A astigmatism; H hyperopia (farsighted); P presbyopia; Mrx spectacle prescription;  CTL contact lenses; OD right eye; OS left eye; OU both eyes  XT exotropia; ET esotropia; PEK punctate epithelial keratitis; PEE punctate epithelial erosions; DES dry eye syndrome; MGD meibomian gland dysfunction; ATs artificial tears; PFAT's preservative free artificial tears; [place] nuclear sclerotic cataract; PSC posterior subcapsular cataract; ERM epi-retinal membrane; PVD posterior vitreous detachment; RD retinal detachment; DM diabetes mellitus; DR diabetic retinopathy; NPDR non-proliferative diabetic retinopathy; PDR proliferative diabetic retinopathy; CSME clinically significant macular edema; DME diabetic macular edema; dbh dot blot hemorrhages; CWS cotton wool spot; POAG primary open angle glaucoma; C/D cup-to-disc ratio; HVF humphrey visual field; GVF goldmann visual field; OCT optical coherence tomography; IOP intraocular pressure; BRVO Branch retinal vein occlusion; CRVO central retinal vein occlusion; CRAO central retinal artery occlusion; BRAO branch retinal artery occlusion; RT retinal tear; SB scleral buckle; PPV pars plana vitrectomy; VH Vitreous hemorrhage; PRP panretinal laser photocoagulation; IVK intravitreal kenalog; VMT vitreomacular traction; MH Macular hole;  NVD neovascularization of the disc; NVE neovascularization elsewhere; AREDS age related eye disease study; ARMD age  related macular degeneration; POAG primary open angle glaucoma; EBMD epithelial/anterior basement membrane dystrophy; ACIOL anterior chamber intraocular lens; IOL intraocular lens; PCIOL posterior chamber intraocular lens; Phaco/IOL phacoemulsification with intraocular lens placement; [place] photorefractive keratectomy; LASIK laser assisted in situ keratomileusis; HTN hypertension; DM diabetes mellitus; COPD chronic obstructive pulmonary disease

## 2018-05-06 ENCOUNTER — Encounter (INDEPENDENT_AMBULATORY_CARE_PROVIDER_SITE_OTHER): Payer: Self-pay | Admitting: Ophthalmology

## 2018-05-06 ENCOUNTER — Ambulatory Visit (INDEPENDENT_AMBULATORY_CARE_PROVIDER_SITE_OTHER): Payer: Medicare Other | Admitting: Ophthalmology

## 2018-05-06 DIAGNOSIS — H25813 Combined forms of age-related cataract, bilateral: Secondary | ICD-10-CM

## 2018-05-06 DIAGNOSIS — H3321 Serous retinal detachment, right eye: Secondary | ICD-10-CM

## 2018-05-06 DIAGNOSIS — H3122 Choroidal dystrophy (central areolar) (generalized) (peripapillary): Secondary | ICD-10-CM | POA: Diagnosis not present

## 2018-05-06 DIAGNOSIS — H31103 Choroidal degeneration, unspecified, bilateral: Secondary | ICD-10-CM

## 2018-05-06 DIAGNOSIS — H3581 Retinal edema: Secondary | ICD-10-CM

## 2018-05-06 DIAGNOSIS — H33101 Unspecified retinoschisis, right eye: Secondary | ICD-10-CM

## 2018-05-06 DIAGNOSIS — H509 Unspecified strabismus: Secondary | ICD-10-CM

## 2018-05-06 DIAGNOSIS — H353134 Nonexudative age-related macular degeneration, bilateral, advanced atrophic with subfoveal involvement: Secondary | ICD-10-CM

## 2018-05-07 ENCOUNTER — Encounter (INDEPENDENT_AMBULATORY_CARE_PROVIDER_SITE_OTHER): Payer: Self-pay | Admitting: Ophthalmology

## 2018-05-08 DIAGNOSIS — M21619 Bunion of unspecified foot: Secondary | ICD-10-CM | POA: Insufficient documentation

## 2018-05-08 NOTE — Progress Notes (Signed)
Subjective:   Patient ID: Kathy [name], female   DOB: 67 y.o.   MRN: [id number]   HPI 67 year old female presents the office today from numerous concerns.  She states that she has a bunion on her left foot which is been ongoing for greater than 6 months which hurts with pressure in shoes.  Denies any recent injury or trauma to the left foot and she said no recent treatment for this.  She also states that she gets pain in the right heel which is been ongoing for about 3 weeks.  She states that it hurts on the bottom of the heel.  She does do a lot of walking at the [facility].  She denies any recent injury or trauma she denies any swelling or redness.  She is also off of her nails were trimmed today as they are thick and elongated she cannot trim them herself.  She has no other concerns today.   Review of Systems  All other systems reviewed and are negative.  Past Medical History:  Diagnosis Date  . Exotropia of left eye   . Fibromyalgia   . History of uterine leiomyoma   . Hypertension   . Macular degeneration   . OA (osteoarthritis)   . Overactive bladder   . Sarcoidosis   . Wears glasses     Past Surgical History:  Procedure Laterality Date  . ABDOMINAL HYSTERECTOMY  2013  . ADJUSTABLE SUTURE MANIPULATION Left 04/10/2015   Procedure: ADJUSTABLE SUTURE MANIPULATION;  Surgeon: Gevena Cotton, MD;  Location: [facility];  Service: Ophthalmology;  Laterality: Left;  . EYE SURGERY    . MEDIAN RECTUS REPAIR Left 04/10/2015   Procedure: MEDIAN RECTUS RESECTION LATERAL RECTUS RECESSION,AJUSTABLES SUTURES LEFT EYE;  Surgeon: Gevena Cotton, MD;  Location: [facility];  Service: Ophthalmology;  Laterality: Left;  . TONSILLECTOMY  as child  . TOTAL ABDOMINAL HYSTERECTOMY W/ BILATERAL SALPINGOOPHORECTOMY  10-14-2010   and TVT midurethral sling and culdoplasty     Current Outpatient Medications:  .  albuterol (PROVENTIL HFA;VENTOLIN HFA) 108 (90 Base) MCG/ACT  inhaler, Inhale 2 puffs into the lungs every 6 (six) hours as needed for wheezing or shortness of breath. (Patient not taking: Reported on 05/03/2018), Disp: 1 Inhaler, Rfl: 0 .  cycloSPORINE (RESTASIS) 0.05 % ophthalmic emulsion, Place 1 drop into both eyes 2 (two) times daily., Disp: , Rfl:  .  meloxicam (MOBIC) 7.5 MG tablet, TAKE 1 TABLET EVERY DAY (Patient not taking: Reported on 05/03/2018), Disp: 90 tablet, Rfl: 1 .  methylPREDNISolone (MEDROL DOSEPAK) 4 MG TBPK tablet, Take as directed, Disp: 21 tablet, Rfl: 0 .  mirabegron ER (MYRBETRIQ) 25 MG TB24 tablet, Take 1 tablet (25 mg total) by mouth daily. (Patient not taking: Reported on 05/03/2018), Disp: 30 tablet, Rfl: 1 .  Multiple Vitamins-Minerals (PRESERVISION/LUTEIN) CAPS, Take by mouth., Disp: , Rfl:  .  NONFORMULARY OR COMPOUNDED ITEM, Apply 1-2 g topically daily. [employer] Nail lacquer: Fluconazole 2%, Terbinafine 1%, DMSO (Patient not taking: Reported on 05/03/2018), Disp: 120 each, Rfl: 3 .  Olmesartan-Amlodipine-HCTZ 40-10-12.5 MG TABS, TAKE 1 TABLET EVERY DAY, Disp: 90 tablet, Rfl: 1 .  tolterodine (DETROL LA) 4 MG 24 hr capsule, Take 1 capsule (4 mg total) by mouth daily., Disp: 30 capsule, Rfl: 5  Current Facility-Administered Medications:  .  0.9 %  sodium chloride infusion, 500 mL, Intravenous, Continuous, Irene Shipper, MD  Allergies  Allergen Reactions  . Lortab [Hydrocodone-Acetaminophen] Nausea And Vomiting  . Latex  Pt unsure of reaction!         Objective:  Physical Exam  General: AAO x3, NAD  Dermatological: Nails are hypertrophic, dystrophic, brittle, discolored, elongated 10. No surrounding redness or drainage. Tenderness nails 1-5 bilaterally. No open lesions or pre-ulcerative lesions are identified today.  Vascular: Dorsalis Pedis artery and Posterior Tibial artery pedal pulses are 2/4 bilateral with immedate capillary fill time.  There is no pain with calf compression, swelling, warmth, erythema.    Neruologic: Grossly intact via light touch bilateral.  Protective threshold with Semmes Wienstein monofilament intact to all pedal sites bilateral.   Musculoskeletal: Tenderness to palpation along the plantar medial tubercle of the calcaneus at the insertion of plantar fascia on the right foot. There is no pain along the course of the plantar fascia within the arch of the foot. Plantar fascia appears to be intact. There is no pain with lateral compression of the calcaneus or pain with vibratory sensation. There is no pain along the course or insertion of the achilles tendon.  Moderate HAV is present in the left foot and there is mild tenderness palpation gently on the bunion site subjectively with pressure in shoes.  There is no pain or crepitation with MPJ range of motion.  No other areas of tenderness to bilateral lower extremities. Muscular strength 5/5 in all groups tested bilateral.  Gait: Unassisted, Nonantalgic.       Assessment:   67 year old female right heel pain, plantar fasciitis; left HAV; symptomatic onychomycosis    Plan:  -Treatment options discussed including all alternatives, risks, and complications -Etiology of symptoms were discussed -X-rays were obtained and reviewed with the patient.  It should be present left foot.  No evidence of acute fracture or stress fracture identified today.  1.  Right heel pain, plantar fasciitis -Steroid injection performed.  See procedure note below.  Medrol Dosepak.  Plantar fascial brace dispensed.  Stretching, icing daily.  Discussed orthotics and shoe modifications. Procedure: Injection Tendon/Ligament Discussed alternatives, risks, complications and verbal consent was obtained.  Location: Right plantar fascia at the glabrous junction; medial approach. Skin Prep: Alcohol. Injectate: 0.5cc 0.5% marcaine plain, 0.5 cc 2% lidocaine plain and, 1 cc kenalog 10. Disposition: Patient tolerated procedure well. Injection site dressed with a  band-aid.  Post-injection care was discussed and return precautions discussed.   2.  Left HAV -Discussed offloading as well as shoe modifications.  Also orthotics.  Also Medrol Dosepak to help with this.  3.  Symptomatic onychomycosis  -Nails debrided x10 without any complications or bleeding  -RTC 3 weeks or sooner if needed  Trula Slade DPM

## 2018-05-13 ENCOUNTER — Other Ambulatory Visit: Payer: Self-pay | Admitting: Family Medicine

## 2018-05-13 DIAGNOSIS — N318 Other neuromuscular dysfunction of bladder: Secondary | ICD-10-CM

## 2018-05-13 DIAGNOSIS — I10 Essential (primary) hypertension: Secondary | ICD-10-CM

## 2018-05-13 NOTE — Telephone Encounter (Signed)
Pt has not been in over a year for BP med and overactive bladder issues. She states she gets a 90 day refill on meds. Is this okay to refill for 90 days. I did schedule her for an appointment for June 25th

## 2018-05-13 NOTE — Telephone Encounter (Signed)
Schedule her for a med check appointment

## 2018-05-17 ENCOUNTER — Ambulatory Visit: Payer: Medicare Other | Admitting: Podiatry

## 2018-05-17 ENCOUNTER — Encounter: Payer: Self-pay | Admitting: Podiatry

## 2018-05-17 DIAGNOSIS — M722 Plantar fascial fibromatosis: Secondary | ICD-10-CM

## 2018-05-17 MED ORDER — TRIAMCINOLONE ACETONIDE 10 MG/ML IJ SUSP
10.0000 mg | Freq: Once | INTRAMUSCULAR | Status: AC
  Administered 2018-05-17: 10 mg

## 2018-05-23 ENCOUNTER — Telehealth: Payer: Self-pay | Admitting: *Deleted

## 2018-05-23 DIAGNOSIS — M722 Plantar fascial fibromatosis: Secondary | ICD-10-CM

## 2018-05-23 NOTE — Progress Notes (Signed)
Subjective: 67 year old female presents the office today for follow-up evaluation of right foot pain, plantar fasciitis as well as for the left bunion.  She states that she is doing better.  She states that she did purchase a new shoes which did help a lot as well as the plantar fascial brace.  She denies any recent injury or trauma denies any swelling or redness. Denies any systemic complaints such as fevers, chills, nausea, vomiting. No acute changes since last appointment, and no other complaints at this time.   Objective: AAO x3, NAD DP/PT pulses palpable bilaterally, CRT less than 3 seconds There is improved.  Continued tenderness palpation of the plantar medial tubercle of the calcaneus at the insertion of plantar fashion the right foot.  Plantar fascia appears to be intact.  Achilles tendon appears to be intact.  Left foot bunion deformity is present without any significant changes however there is no tenderness identified at this time.  There is no pain or crepitation with MPJ range of motion. No open lesions or pre-ulcerative lesions.  No pain with calf compression, swelling, warmth, erythema  Assessment: Right plantar fasciitis; left HAV  Plan: -All treatment options discussed with the patient including all alternatives, risks, complications.  -Steroid injection performed.  See procedure note below.  Continue stretching, icing exercises daily as well as supportive shoes as well as the brace.  Discussed orthotics. -Patient encouraged to call the office with any questions, concerns, change in symptoms.   Procedure: Injection Tendon/Ligament Discussed alternatives, risks, complications and verbal consent was obtained.  Location: Right plantar fascia at the glabrous junction; medial approach. Skin Prep: Alcohol. Injectate: 0.5cc 0.5% marcaine plain, 0.5 cc 2% lidocaine plain and, 1 cc kenalog 10. Disposition: Patient tolerated procedure well. Injection site dressed with a band-aid.   Post-injection care was discussed and return precautions discussed.   Trula Slade DPM

## 2018-05-23 NOTE — Telephone Encounter (Signed)
Orders to [facility].

## 2018-05-24 ENCOUNTER — Ambulatory Visit (INDEPENDENT_AMBULATORY_CARE_PROVIDER_SITE_OTHER): Payer: Medicare Other | Admitting: Family Medicine

## 2018-05-24 ENCOUNTER — Encounter: Payer: Self-pay | Admitting: Family Medicine

## 2018-05-24 VITALS — BP 132/82 | HR 83 | Temp 98.1°F | Wt 172.6 lb

## 2018-05-24 DIAGNOSIS — D869 Sarcoidosis, unspecified: Secondary | ICD-10-CM

## 2018-05-24 DIAGNOSIS — I10 Essential (primary) hypertension: Secondary | ICD-10-CM

## 2018-05-24 DIAGNOSIS — H3530 Unspecified macular degeneration: Secondary | ICD-10-CM

## 2018-05-24 DIAGNOSIS — M1990 Unspecified osteoarthritis, unspecified site: Secondary | ICD-10-CM

## 2018-05-24 DIAGNOSIS — M797 Fibromyalgia: Secondary | ICD-10-CM | POA: Diagnosis not present

## 2018-05-24 DIAGNOSIS — E785 Hyperlipidemia, unspecified: Secondary | ICD-10-CM | POA: Diagnosis not present

## 2018-05-24 DIAGNOSIS — R142 Eructation: Secondary | ICD-10-CM

## 2018-05-24 DIAGNOSIS — N318 Other neuromuscular dysfunction of bladder: Secondary | ICD-10-CM

## 2018-05-24 DIAGNOSIS — J301 Allergic rhinitis due to pollen: Secondary | ICD-10-CM | POA: Diagnosis not present

## 2018-05-24 MED ORDER — OLMESARTAN-AMLODIPINE-HCTZ 40-10-12.5 MG PO TABS: ORAL_TABLET | ORAL | 3 refills | Status: DC

## 2018-05-24 MED ORDER — MELOXICAM 7.5 MG PO TABS: 7.5000 mg | ORAL_TABLET | Freq: Every day | ORAL | 3 refills | Status: DC

## 2018-05-24 NOTE — Patient Instructions (Signed)
Take Prilosec regularly for the next week or 2 to see if that will help quiet things down.  If it does then back off and take it as needed

## 2018-05-24 NOTE — Progress Notes (Signed)
   Subjective:    Patient ID: Kathy [name], female    DOB: September 07, 1951, 67 y.o.   MRN: [id number]  HPI She is here for med check appointment.  She does have underlying allergies and they are under good control.  She also has arthritis mainly in her feet and does see podiatry for that.  She is using meloxicam and getting good results with that.  [facility].  She continues on her blood pressure medications.  She does have a previous history of sarcoidosis as well as fibromyalgia but at the present time is having very little difficulty with that.  Her main complaint today is eructation.  She does not necessarily relate this to any particular foods and it is intermittent in nature.  She does not note that greasy foods or milk plays much of a role in that.  She sees her ophthalmologist regularly.  Presently she is having no urinary problems.  In the past she did have OAB symptoms.   Review of Systems     Objective:   Physical Exam Alert and in no distress. Tympanic membranes and canals are normal. Pharyngeal area is normal. Neck is supple without adenopathy or thyromegaly. Cardiac exam shows a regular sinus rhythm without murmurs or gallops. Lungs are clear to auscultation.       Assessment & Plan:  Allergic rhinitis due to pollen, unspecified seasonality  Arthritis  Essential hypertension - Plan: CBC with Differential/Platelet, Comprehensive metabolic panel, Olmesartan-amLODIPine-HCTZ 40-10-12.5 MG TABS  Sarcoidosis  Fibromyalgia  Eructation  Hyperlipidemia, unspecified hyperlipidemia type - Plan: Lipid panel  OVERACTIVE BLADDER - Plan: meloxicam (MOBIC) 7.5 MG tablet Overall she is doing quite well.  I will continue her on Mobic but did recommend she take that on an as-needed basis rather than regularly.  Continue on her blood pressure medication.  [facility]

## 2018-05-25 LAB — CBC WITH DIFFERENTIAL/PLATELET
Basophils Absolute: 0 10*3/uL (ref 0.0–0.2)
Basos: 1 %
EOS (ABSOLUTE): 0.1 10*3/uL (ref 0.0–0.4)
EOS: 2 %
HEMATOCRIT: 41.2 % (ref 34.0–46.6)
HEMOGLOBIN: 13.9 g/dL (ref 11.1–15.9)
Immature Grans (Abs): 0 10*3/uL (ref 0.0–0.1)
Immature Granulocytes: 0 %
Lymphocytes Absolute: 1.8 10*3/uL (ref 0.7–3.1)
Lymphs: 31 %
MCH: 29.6 pg (ref 26.6–33.0)
MCHC: 33.7 g/dL (ref 31.5–35.7)
MCV: 88 fL (ref 79–97)
MONOCYTES: 9 %
Monocytes Absolute: 0.5 10*3/uL (ref 0.1–0.9)
Neutrophils Absolute: 3.4 10*3/uL (ref 1.4–7.0)
Neutrophils: 57 %
Platelets: 327 10*3/uL (ref 150–450)
RBC: 4.69 x10E6/uL (ref 3.77–5.28)
RDW: 12.9 % (ref 12.3–15.4)
WBC: 5.8 10*3/uL (ref 3.4–10.8)

## 2018-05-25 LAB — COMPREHENSIVE METABOLIC PANEL
ALBUMIN: 4.7 g/dL (ref 3.6–4.8)
ALK PHOS: 85 IU/L (ref 39–117)
ALT: 14 IU/L (ref 0–32)
AST: 20 IU/L (ref 0–40)
Albumin/Globulin Ratio: 1.8 (ref 1.2–2.2)
BUN / CREAT RATIO: 24 (ref 12–28)
BUN: 19 mg/dL (ref 8–27)
Bilirubin Total: 0.4 mg/dL (ref 0.0–1.2)
CALCIUM: 9.7 mg/dL (ref 8.7–10.3)
CO2: 26 mmol/L (ref 20–29)
CREATININE: 0.78 mg/dL (ref 0.57–1.00)
Chloride: 100 mmol/L (ref 96–106)
GFR calc Af Amer: 92 mL/min/{1.73_m2} (ref >59)
GFR, EST NON AFRICAN AMERICAN: 79 mL/min/{1.73_m2} (ref >59)
GLOBULIN, TOTAL: 2.6 g/dL (ref 1.5–4.5)
Glucose: 83 mg/dL (ref 65–99)
Potassium: 4.3 mmol/L (ref 3.5–5.2)
SODIUM: 141 mmol/L (ref 134–144)
Total Protein: 7.3 g/dL (ref 6.0–8.5)

## 2018-05-25 LAB — LIPID PANEL
CHOL/HDL RATIO: 2.5 ratio (ref 0.0–4.4)
Cholesterol, Total: 229 mg/dL — ABNORMAL HIGH (ref 100–199)
HDL: 93 mg/dL (ref >39)
LDL CALC: 119 mg/dL — AB (ref 0–99)
Triglycerides: 85 mg/dL (ref 0–149)
VLDL Cholesterol Cal: 17 mg/dL (ref 5–40)

## 2018-05-26 NOTE — Progress Notes (Signed)
[facility] Note  05/27/2018     CHIEF COMPLAINT Patient presents for Retina Follow Up   HISTORY OF PRESENT ILLNESS: Kathy [name] is a 67 y.o. female who presents to the clinic today for:    HPI    Retina Follow Up    Patient presents with  Retinal Break/Detachment.  In right eye.  This started 1 month ago.  Severity is mild.  Since onset it is stable.  I, the attending physician,  performed the HPI with the patient and updated documentation appropriately.          Comments    F/U Laser retinopexy OD. Patient states she continues to have occasional flashes OD and she has new on set of itching OU. Pt reports she received new glasses in May. Pt is using ReStasis OU PRN.        Last edited by Bernarda Caffey, MD on 05/27/2018 11:01 AM. (History)    Pt states she noticed "some flashes" after the laser but is unsure of when that was;   Referring physician: Denita Lung, MD [address] [place], [place] [address]  HISTORICAL INFORMATION:   Selected notes from the MEDICAL RECORD NUMBER Referred by Dr. Gevena Cotton for concern of central areolar choroidal dystrophy LEE: 05.16.19 (M. Frederico Hamman) [BCVA: OD: 20/30-2 OS: CF] Ocular Hx-Central areolar choroidal dystrophy, Macular Degeneration, exotropia OS, cataracts OU, dry eyes PMH-HTN, Fibromyalgia     CURRENT MEDICATIONS: Current Outpatient Medications (Ophthalmic Drugs)  Medication Sig  . cycloSPORINE (RESTASIS) 0.05 % ophthalmic emulsion Place 1 drop into both eyes 2 (two) times daily.   No current facility-administered medications for this visit.  (Ophthalmic Drugs)   Current Outpatient Medications (Other)  Medication Sig  . albuterol (PROVENTIL HFA;VENTOLIN HFA) 108 (90 Base) MCG/ACT inhaler Inhale 2 puffs into the lungs every 6 (six) hours as needed for wheezing or shortness of breath.  . meloxicam (MOBIC) 7.5 MG tablet Take 1 tablet (7.5 mg total) by mouth daily.  . Multiple  Vitamins-Minerals (PRESERVISION/LUTEIN) CAPS Take by mouth.  . NONFORMULARY OR COMPOUNDED ITEM Apply 1-2 g topically daily. [employer] Nail lacquer: Fluconazole 2%, Terbinafine 1%, DMSO  . Olmesartan-amLODIPine-HCTZ 40-10-12.5 MG TABS TAKE 1 TABLET BY MOUTH EVERY DAY   Current Facility-Administered Medications (Other)  Medication Route  . 0.9 %  sodium chloride infusion Intravenous      REVIEW OF SYSTEMS: ROS    Positive for: Eyes   Negative for: Constitutional, Gastrointestinal, Neurological, Skin, Genitourinary, Musculoskeletal, HENT, Endocrine, Cardiovascular, Respiratory, Psychiatric, Allergic/Imm, Heme/Lymph   Last edited by Zenovia Jordan, LPN on 01/19/2541  7:06 AM. (History)       ALLERGIES Allergies  Allergen Reactions  . Lortab [Hydrocodone-Acetaminophen] Nausea And Vomiting  . Latex     Pt unsure of reaction!    PAST MEDICAL HISTORY Past Medical History:  Diagnosis Date  . Exotropia of left eye   . Fibromyalgia   . History of uterine leiomyoma   . Hypertension   . Macular degeneration   . OA (osteoarthritis)   . Overactive bladder   . Sarcoidosis   . Wears glasses    Past Surgical History:  Procedure Laterality Date  . ABDOMINAL HYSTERECTOMY  2013  . ADJUSTABLE SUTURE MANIPULATION Left 04/10/2015   Procedure: ADJUSTABLE SUTURE MANIPULATION;  Surgeon: Gevena Cotton, MD;  Location: [facility];  Service: Ophthalmology;  Laterality: Left;  . EYE SURGERY    . MEDIAN RECTUS REPAIR Left 04/10/2015   Procedure: MEDIAN  RECTUS RESECTION LATERAL RECTUS RECESSION,AJUSTABLES SUTURES LEFT EYE;  Surgeon: Gevena Cotton, MD;  Location: [facility];  Service: Ophthalmology;  Laterality: Left;  . TONSILLECTOMY  as child  . TOTAL ABDOMINAL HYSTERECTOMY W/ BILATERAL SALPINGOOPHORECTOMY  10-14-2010   and TVT midurethral sling and culdoplasty    FAMILY HISTORY Family History  Problem Relation Age of Onset  . Colon cancer Mother   .  Diabetes Father   . Diabetes Sister   . Hypertension Sister   . Diabetes Sister   . Hypertension Sister   . Hypertension Sister     SOCIAL HISTORY Social History   Tobacco Use  . Smoking status: Never Smoker  . Smokeless tobacco: Never Used  Substance Use Topics  . Alcohol use: Yes    Alcohol/week: 0.6 oz    Types: 1 Glasses of wine per week    Comment: rare  . Drug use: No         OPHTHALMIC EXAM:  Base Eye Exam    Visual Acuity (Snellen - Linear)      Right Left   Dist cc 20/40 CF at 2"   Dist ph cc NI    Correction:  Glasses       Tonometry (Tonopen, 9:52 AM)      Right Left   Pressure 18 18       Pupils      Dark Light Shape React APD   Right 5 4 Round Slow None   Left 5 4 Round Sluggish None       Visual Fields (Counting fingers)      Left Right    Full Full       Extraocular Movement      Right Left    Full, Ortho Full, Ortho       Neuro/Psych    Oriented x3:  Yes   Mood/Affect:  Normal       Dilation    Both eyes:  1.0% Mydriacyl, Paremyd @ 9:52 AM        Slit Lamp and Fundus Exam    Slit Lamp Exam      Right Left   Lids/Lashes Dermatochalasis - upper lid Dermatochalasis - upper lid   Conjunctiva/Sclera mild Melanosis mild Melanosis   Cornea Arcus Arcus, linear scar at 0430   Anterior Chamber Deep and quiet Deep and quiet   Iris Round and dilated Round and dilated   Lens 2+ Cortical cataract, 2+ Nuclear sclerosis 2+ Nuclear sclerosis, 2+ Cortical cataract, Vacuoles   Vitreous Vitreous syneresis, Posterior vitreous detachment Vitreous syneresis       Fundus Exam      Right Left   Disc Pink and Sharp    C/D Ratio 0.2 0.4   Macula large area of RPE and chorodial atrophy with small central foveal island spared    Vessels AV crossing changes, Vascular attenuation,    Periphery Attached,  vitreous condensations at 0300 with retinal break and +SRF / focal RD within bed of retinoschisis -- incomplete laser surrounding posterior and  inferior borders         Refraction    Wearing Rx      Sphere Cylinder Axis Add   Right +0.25 +0.50 052 +2.50   Left +0.25 Sphere  +2.50   Type:  PAL       Manifest Refraction      Sphere Cylinder Axis Dist VA   Right +0.75 +0.75 078 20/30-1   Left  IMAGING AND PROCEDURES  Imaging and Procedures for @TODAY @  OCT, Retina - OU - Both Eyes       Right Eye Quality was good. Central Foveal Thickness: 225. Progression has been stable. Findings include abnormal foveal contour, inner retinal atrophy, outer retinal atrophy, no SRF, no IRF, retinal drusen  (Diffuse geographic atrophy with small central island spared).   Left Eye Quality was good. Central Foveal Thickness: 217. Progression has been stable. Findings include outer retinal atrophy, inner retinal atrophy, abnormal foveal contour, no IRF, no SRF (Diffuse central atrophy).   Notes *Images captured and stored on drive  Diagnosis / Impression:  Diffuse atrophy OU OD with central island of non-atrophied retina Central areolar choroidal dystrophy OU  Clinical management:  See below  Abbreviations: NFP - Normal foveal profile. CME - cystoid macular edema. PED - pigment epithelial detachment. IRF - intraretinal fluid. SRF - subretinal fluid. EZ - ellipsoid zone. ERM - epiretinal membrane. ORA - outer retinal atrophy. ORT - outer retinal tubulation. SRHM - subretinal hyper-reflective material        LASER PROCEDURE NOTE  Procedure:  Barrier laser retinopexy using laser indirect ophthalmoscope, RIGHT eye   Diagnosis:  Retinal tear with +subretinal fluid / focal retinal detachment, RIGHT eye                      Flap tear at 330 o'clock anterior to equatorwith fluid tracking posteriorly   Surgeon: Bernarda Caffey, MD, PhD  Anesthesia: Topical  Informed consent obtained, operative eye marked, and time out performed prior to initiation of laser.   Laser settings:  Lumenis [id number] laser indirect  ophthalmoscope Power: 250 mW Duration: 100 msec  # spots: 508  Placement of laser: Fill in laser was placed in three confluent rows around focal RD in nasal quadrant.  Complications: None.  Patient tolerated the procedure well and received written and verbal post-procedure care information/education.          ASSESSMENT/PLAN:    ICD-10-CM   1. Retinal detachment, right H33.21   2. 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 Choroidal atrophy of both eyes H31.103   5. Advanced atrophic nonexudative age-related macular degeneration of both eyes with subfoveal involvement H35.3134   6. Retinal edema H35.81 OCT, Retina - OU - Both Eyes  7. Combined form of age-related cataract, both eyes H25.813   8. Strabismus H50.9     1,2. Localized, focal retinal detachment / SRF surrounding small retinal tear at 0330, within retinoschisis cavity, RIGHT EYE - asymptomatic, but found on exam - tear located at 0330 OD - S/P laser retinopexy OD (05.23.19) -- incomplete barrier laser surrounding - no change in SRF/schisis-detachment - recommend touch up laser OD today (06.28.19) - pt wishes to proceed - RBA of procedure discussed, questions answered - informed consent obtained and signed - see procedure note above - start PF QID x7 days - f/u in 3 wks  3-5. Severe outer retinal and choroidal atrophy OU - stable today - likely central areolar choroidal dystrophy -- pt notes declining vision that started in her 39-50s - differential also includes age related macular degeneration, non-exudative, both eyes  - The incidence, anatomy, and pathology of dry AMD, risk of progression, and the AREDS and AREDS 2 study including smoking risks discussed with patient.  - continue amsler grid monitoring  6. No retinal edema on exam or OCT  7. Combined form of age-related cataracts OU - The  symptoms of cataract, surgical options, and treatments and risks were  discussed with patient. - discussed diagnosis and progression - not yet visually significant - monitor for now  8. Strabismus - under the expert management of Dr. Frederico Hamman - s/p EOM surgery 04/10/15 - monitor  Ophthalmic Meds Ordered this visit:  No orders of the defined types were placed in this encounter.      No follow-ups on file.  There are no Patient Instructions on file for this visit.   Explained the diagnoses, plan, and follow up with the patient and they expressed understanding.  Patient expressed understanding of the importance of proper follow up care.   This document serves as a record of services personally performed by Gardiner Sleeper, MD, PhD. It was created on their behalf by Ernest Mallick, OA, an ophthalmic assistant. The creation of this record is the provider's dictation and/or activities during the visit.    Electronically signed by: Ernest Mallick, OA  06.27.2019 8:25 AM    Gardiner Sleeper, M.D., Ph.D. Diseases & Surgery of the Retina and Vitreous [facility]   I have reviewed the above documentation for accuracy and completeness, and I agree with the above. Gardiner Sleeper, M.D., Ph.D. 05/31/18 12:32 PM     Abbreviations: M myopia (nearsighted); A astigmatism; H hyperopia (farsighted); P presbyopia; Mrx spectacle prescription;  CTL contact lenses; OD right eye; OS left eye; OU both eyes  XT exotropia; ET esotropia; PEK punctate epithelial keratitis; PEE punctate epithelial erosions; DES dry eye syndrome; MGD meibomian gland dysfunction; ATs artificial tears; PFAT's preservative free artificial tears; [place] nuclear sclerotic cataract; PSC posterior subcapsular cataract; ERM epi-retinal membrane; PVD posterior vitreous detachment; RD retinal detachment; DM diabetes mellitus; DR diabetic retinopathy; NPDR non-proliferative diabetic retinopathy; PDR proliferative diabetic retinopathy; CSME clinically significant macular edema; DME diabetic  macular edema; dbh dot blot hemorrhages; CWS cotton wool spot; POAG primary open angle glaucoma; C/D cup-to-disc ratio; HVF humphrey visual field; GVF goldmann visual field; OCT optical coherence tomography; IOP intraocular pressure; BRVO Branch retinal vein occlusion; CRVO central retinal vein occlusion; CRAO central retinal artery occlusion; BRAO branch retinal artery occlusion; RT retinal tear; SB scleral buckle; PPV pars plana vitrectomy; VH Vitreous hemorrhage; PRP panretinal laser photocoagulation; IVK intravitreal kenalog; VMT vitreomacular traction; MH Macular hole;  NVD neovascularization of the disc; NVE neovascularization elsewhere; AREDS age related eye disease study; ARMD age related macular degeneration; POAG primary open angle glaucoma; EBMD epithelial/anterior basement membrane dystrophy; ACIOL anterior chamber intraocular lens; IOL intraocular lens; PCIOL posterior chamber intraocular lens; Phaco/IOL phacoemulsification with intraocular lens placement; [place] photorefractive keratectomy; LASIK laser assisted in situ keratomileusis; HTN hypertension; DM diabetes mellitus; COPD chronic obstructive pulmonary disease

## 2018-05-27 ENCOUNTER — Encounter (INDEPENDENT_AMBULATORY_CARE_PROVIDER_SITE_OTHER): Payer: Self-pay | Admitting: Ophthalmology

## 2018-05-27 ENCOUNTER — Ambulatory Visit (INDEPENDENT_AMBULATORY_CARE_PROVIDER_SITE_OTHER): Payer: Medicare Other | Admitting: Ophthalmology

## 2018-05-27 DIAGNOSIS — H3122 Choroidal dystrophy (central areolar) (generalized) (peripapillary): Secondary | ICD-10-CM

## 2018-05-27 DIAGNOSIS — H3581 Retinal edema: Secondary | ICD-10-CM | POA: Diagnosis not present

## 2018-05-27 DIAGNOSIS — H31103 Choroidal degeneration, unspecified, bilateral: Secondary | ICD-10-CM

## 2018-05-27 DIAGNOSIS — H33101 Unspecified retinoschisis, right eye: Secondary | ICD-10-CM

## 2018-05-27 DIAGNOSIS — H3321 Serous retinal detachment, right eye: Secondary | ICD-10-CM

## 2018-05-27 DIAGNOSIS — H353134 Nonexudative age-related macular degeneration, bilateral, advanced atrophic with subfoveal involvement: Secondary | ICD-10-CM

## 2018-05-27 DIAGNOSIS — H25813 Combined forms of age-related cataract, bilateral: Secondary | ICD-10-CM

## 2018-05-27 DIAGNOSIS — H509 Unspecified strabismus: Secondary | ICD-10-CM

## 2018-05-31 ENCOUNTER — Encounter (INDEPENDENT_AMBULATORY_CARE_PROVIDER_SITE_OTHER): Payer: Self-pay | Admitting: Ophthalmology

## 2018-06-07 ENCOUNTER — Ambulatory Visit: Payer: Medicare Other | Admitting: Podiatry

## 2018-06-07 DIAGNOSIS — M722 Plantar fascial fibromatosis: Secondary | ICD-10-CM

## 2018-06-08 DIAGNOSIS — M722 Plantar fascial fibromatosis: Secondary | ICD-10-CM | POA: Insufficient documentation

## 2018-06-08 NOTE — Progress Notes (Signed)
Subjective: 67 year old female presents the office for follow-up evaluation of right heel pain, plantar fasciitis.  She states that she is having no pain she is doing much better she feels that she is almost completely back to normal.  She is to been stretching icing.  She has no new concerns. Denies any systemic complaints such as fevers, chills, nausea, vomiting. No acute changes since last appointment, and no other complaints at this time.   Objective: AAO x3, NAD DP/PT pulses palpable bilaterally, CRT less than 3 seconds There is no tenderness to palpation along the plantar medial tubercle of the calcaneus at the insertion of plantar fascia on the right foot. There is no pain along the course of the plantar fascia within the arch of the foot. Plantar fascia appears to be intact. There is no pain with lateral compression of the calcaneus or pain with vibratory sensation. There is no pain along the course or insertion of the achilles tendon. No other areas of tenderness to bilateral lower extremities. No open lesions or pre-ulcerative lesions.  No pain with calf compression, swelling, warmth, erythema  Assessment: Resolved right heel pain, plantar fasciitis  Plan: -All treatment options discussed with the patient including all alternatives, risks, complications.  -At this point she is doing well she had no pain.  Continue with stretching, icing exercises and gradually increase her activity level.  She cut her walking back and how often we can gradually get back to this.  She has any issues to let me know.  In the meantime call any questions or concerns.  Continue supportive shoes that she already has. -Patient encouraged to call the office with any questions, concerns, change in symptoms.   Trula Slade DPM

## 2018-06-15 NOTE — Progress Notes (Addendum)
[facility] Note  06/17/2018     CHIEF COMPLAINT Patient presents for Retina Follow Up   HISTORY OF PRESENT ILLNESS: Kathy [name] is a 67 y.o. female who presents to the clinic today for:    HPI    Retina Follow Up    Patient presents with  Retinal Break/Detachment.  In right eye.  This started 1 month ago.  Severity is mild.  Since onset it is stable.  I, the attending physician,  performed the HPI with the patient and updated documentation appropriately.          Comments    F/U RD OD, S/P laser retinopexy OD. Patient states her vision is "about the same", pt occasionally has flashes of light and itching OD.        Last edited by Bernarda Caffey, MD on 06/17/2018 10:23 AM. (History)    Pt states she noticed "some flashes" after the laser but is unsure of when that was;   Referring physician: Denita Lung, MD [address] [place], [place] [address]  HISTORICAL INFORMATION:   Selected notes from the MEDICAL RECORD NUMBER Referred by Dr. Gevena Cotton for concern of central areolar choroidal dystrophy LEE: 05.16.19 (M. Frederico Hamman) [BCVA: OD: 20/30-2 OS: CF] Ocular Hx-Central areolar choroidal dystrophy, Macular Degeneration, exotropia OS, cataracts OU, dry eyes PMH-HTN, Fibromyalgia     CURRENT MEDICATIONS: Current Outpatient Medications (Ophthalmic Drugs)  Medication Sig  . cycloSPORINE (RESTASIS) 0.05 % ophthalmic emulsion Place 1 drop into both eyes 2 (two) times daily.   No current facility-administered medications for this visit.  (Ophthalmic Drugs)   Current Outpatient Medications (Other)  Medication Sig  . albuterol (PROVENTIL HFA;VENTOLIN HFA) 108 (90 Base) MCG/ACT inhaler Inhale 2 puffs into the lungs every 6 (six) hours as needed for wheezing or shortness of breath.  . meloxicam (MOBIC) 7.5 MG tablet Take 1 tablet (7.5 mg total) by mouth daily.  . Multiple Vitamins-Minerals (PRESERVISION/LUTEIN) CAPS Take by mouth.  .  NONFORMULARY OR COMPOUNDED ITEM Apply 1-2 g topically daily. [employer] Nail lacquer: Fluconazole 2%, Terbinafine 1%, DMSO  . Olmesartan-amLODIPine-HCTZ 40-10-12.5 MG TABS TAKE 1 TABLET BY MOUTH EVERY DAY   Current Facility-Administered Medications (Other)  Medication Route  . 0.9 %  sodium chloride infusion Intravenous      REVIEW OF SYSTEMS: ROS    Positive for: Eyes   Negative for: Constitutional, Gastrointestinal, Neurological, Skin, Genitourinary, Musculoskeletal, HENT, Endocrine, Cardiovascular, Respiratory, Psychiatric, Allergic/Imm, Heme/Lymph   Last edited by Zenovia Jordan, LPN on 0/16/0109  3:23 AM. (History)       ALLERGIES Allergies  Allergen Reactions  . Lortab [Hydrocodone-Acetaminophen] Nausea And Vomiting  . Latex     Pt unsure of reaction!    PAST MEDICAL HISTORY Past Medical History:  Diagnosis Date  . Exotropia of left eye   . Fibromyalgia   . History of uterine leiomyoma   . Hypertension   . Macular degeneration   . OA (osteoarthritis)   . Overactive bladder   . Sarcoidosis   . Wears glasses    Past Surgical History:  Procedure Laterality Date  . ABDOMINAL HYSTERECTOMY  2013  . ADJUSTABLE SUTURE MANIPULATION Left 04/10/2015   Procedure: ADJUSTABLE SUTURE MANIPULATION;  Surgeon: Gevena Cotton, MD;  Location: [facility];  Service: Ophthalmology;  Laterality: Left;  . EYE SURGERY    . MEDIAN RECTUS REPAIR Left 04/10/2015   Procedure: MEDIAN RECTUS RESECTION LATERAL RECTUS RECESSION,AJUSTABLES SUTURES LEFT EYE;  Surgeon: Gevena Cotton,  MD;  Location: [facility];  Service: Ophthalmology;  Laterality: Left;  . TONSILLECTOMY  as child  . TOTAL ABDOMINAL HYSTERECTOMY W/ BILATERAL SALPINGOOPHORECTOMY  10-14-2010   and TVT midurethral sling and culdoplasty    FAMILY HISTORY Family History  Problem Relation Age of Onset  . Colon cancer Mother   . Diabetes Father   . Diabetes Sister   . Hypertension Sister   .  Diabetes Sister   . Hypertension Sister   . Hypertension Sister     SOCIAL HISTORY Social History   Tobacco Use  . Smoking status: Never Smoker  . Smokeless tobacco: Never Used  Substance Use Topics  . Alcohol use: Yes    Alcohol/week: 0.6 oz    Types: 1 Glasses of wine per week    Comment: rare  . Drug use: No         OPHTHALMIC EXAM:  Base Eye Exam    Visual Acuity (Snellen - Linear)      Right Left   Dist cc 20/40 CF at 2"   Dist ph cc NI NI   Correction:  Glasses       Tonometry (Tonopen, 9:49 AM)      Right Left   Pressure 17 18       Pupils      Dark Light Shape React APD   Right 5 4 Round Sluggish None   Left 5 4 Round Sluggish None       Visual Fields (Counting fingers)      Left Right    Full Full       Extraocular Movement      Right Left    Full, Ortho Full, Ortho       Neuro/Psych    Oriented x3:  Yes   Mood/Affect:  Normal       Dilation    Both eyes:  1.0% Mydriacyl, 2.5% Phenylephrine @ 9:49 AM        Slit Lamp and Fundus Exam    Slit Lamp Exam      Right Left   Lids/Lashes Dermatochalasis - upper lid Dermatochalasis - upper lid   Conjunctiva/Sclera mild Melanosis mild Melanosis   Cornea Arcus Arcus, linear scar at 0430   Anterior Chamber Deep and quiet Deep and quiet   Iris Round and dilated Round and dilated   Lens 2+ Cortical cataract, 2+ Nuclear sclerosis 2+ Nuclear sclerosis, 2+ Cortical cataract, Vacuoles   Vitreous Vitreous syneresis, Posterior vitreous detachment Vitreous syneresis       Fundus Exam      Right Left   Disc Pink and Sharp Tilted disc, almost 360 degrees of PPA   C/D Ratio 0.2 0.4   Macula large area of RPE and chorodial atrophy with small central foveal island spared large area of choroidal and retinal pigment epithelial atrophy extending to nasal side of disc, Pigment clumping, No heme    Vessels AV crossing changes, Vascular attenuation, Vascular attenuation   Periphery Attached,  vitreous  condensations at 0300 with retinal break and +SRF / focal RD within bed of retinoschisis -- stable with good laser surrounding Attached             IMAGING AND PROCEDURES  Imaging and Procedures for @TODAY @  OCT, Retina - OU - Both Eyes       Right Eye Quality was good. Central Foveal Thickness: 203. Progression has been stable. Findings include abnormal foveal contour, inner retinal atrophy, outer retinal atrophy, no SRF, no  IRF, retinal drusen  (Diffuse geographic atrophy with small central island spared).   Left Eye Quality was good. Central Foveal Thickness: 212. Progression has been stable. Findings include outer retinal atrophy, inner retinal atrophy, abnormal foveal contour, no IRF, no SRF (Diffuse central atrophy).   Notes *Images captured and stored on drive  Diagnosis / Impression:  Diffuse atrophy OU OD with central island of non-atrophied retina Central areolar choroidal dystrophy OU  Clinical management:  See below  Abbreviations: NFP - Normal foveal profile. CME - cystoid macular edema. PED - pigment epithelial detachment. IRF - intraretinal fluid. SRF - subretinal fluid. EZ - ellipsoid zone. ERM - epiretinal membrane. ORA - outer retinal atrophy. ORT - outer retinal tubulation. SRHM - subretinal hyper-reflective material                ASSESSMENT/PLAN:    ICD-10-CM   1. Retinal detachment, right H33.21 OCT, Retina - OU - Both Eyes  2. 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 Choroidal atrophy of both eyes H31.103   5. Advanced atrophic nonexudative age-related macular degeneration of both eyes with subfoveal involvement H35.3134   6. Retinal edema H35.81 OCT, Retina - OU - Both Eyes  7. Combined form of age-related cataract, both eyes H25.813   8. Strabismus H50.9     1,2. Localized, focal retinal detachment / SRF surrounding small retinal tear at 0330, within retinoschisis cavity, RIGHT EYE - asymptomatic,  but found on exam - tear located at 0330 OD - S/P laser retinopexy OD (05.23.19), S/P touch up laser retinopexy OD (06.28.19) - good laser in place - no change in SRF/schisis-detachment - finished PF QID x7 days - f/u in 4-6 weeks -- repeat widefield OCT over 0300 schisis cavity  3-5. Severe outer retinal and choroidal atrophy OU - stable today - likely central areolar choroidal dystrophy -- pt notes declining vision that started in her 33-50s - differential also includes age related macular degeneration, non-exudative, both eyes  - The incidence, anatomy, and pathology of dry AMD, risk of progression, and the AREDS and AREDS 2 study including smoking risks discussed with patient.  - continue amsler grid monitoring   6. No retinal edema on exam or OCT  7. Combined form of age-related cataracts OU - The symptoms of cataract, surgical options, and treatments and risks were discussed with patient. - discussed diagnosis and progression - not yet visually significant - monitor for now  8. Strabismus - under the expert management of Dr. Frederico Hamman - s/p EOM surgery 04/10/15 - monitor  Ophthalmic Meds Ordered this visit:  No orders of the defined types were placed in this encounter.      Return in about 6 weeks (around 07/29/2018) for F/U focal RD repair OD, DFE, OCT.  There are no Patient Instructions on file for this visit.   Explained the diagnoses, plan, and follow up with the patient and they expressed understanding.  Patient expressed understanding of the importance of proper follow up care.   This document serves as a record of services personally performed by Gardiner Sleeper, MD, PhD. It was created on their behalf by Catha Brow, [place], a certified ophthalmic assistant. The creation of this record is the provider's dictation and/or activities during the visit.  Electronically signed by: Catha Brow, COA  07.17.19 10:38 AM   Gardiner Sleeper, M.D., Ph.D. Diseases &  Surgery of the Retina and Vitreous [facility]  I have  reviewed the above documentation for accuracy and completeness, and I agree with the above. Gardiner Sleeper, M.D., Ph.D. 06/17/18 10:38 AM   Abbreviations: M myopia (nearsighted); A astigmatism; H hyperopia (farsighted); P presbyopia; Mrx spectacle prescription;  CTL contact lenses; OD right eye; OS left eye; OU both eyes  XT exotropia; ET esotropia; PEK punctate epithelial keratitis; PEE punctate epithelial erosions; DES dry eye syndrome; MGD meibomian gland dysfunction; ATs artificial tears; PFAT's preservative free artificial tears; [place] nuclear sclerotic cataract; PSC posterior subcapsular cataract; ERM epi-retinal membrane; PVD posterior vitreous detachment; RD retinal detachment; DM diabetes mellitus; DR diabetic retinopathy; NPDR non-proliferative diabetic retinopathy; PDR proliferative diabetic retinopathy; CSME clinically significant macular edema; DME diabetic macular edema; dbh dot blot hemorrhages; CWS cotton wool spot; POAG primary open angle glaucoma; C/D cup-to-disc ratio; HVF humphrey visual field; GVF goldmann visual field; OCT optical coherence tomography; IOP intraocular pressure; BRVO Branch retinal vein occlusion; CRVO central retinal vein occlusion; CRAO central retinal artery occlusion; BRAO branch retinal artery occlusion; RT retinal tear; SB scleral buckle; PPV pars plana vitrectomy; VH Vitreous hemorrhage; PRP panretinal laser photocoagulation; IVK intravitreal kenalog; VMT vitreomacular traction; MH Macular hole;  NVD neovascularization of the disc; NVE neovascularization elsewhere; AREDS age related eye disease study; ARMD age related macular degeneration; POAG primary open angle glaucoma; EBMD epithelial/anterior basement membrane dystrophy; ACIOL anterior chamber intraocular lens; IOL intraocular lens; PCIOL posterior chamber intraocular lens; Phaco/IOL phacoemulsification with intraocular lens  placement; [place] photorefractive keratectomy; LASIK laser assisted in situ keratomileusis; HTN hypertension; DM diabetes mellitus; COPD chronic obstructive pulmonary disease

## 2018-06-17 ENCOUNTER — Ambulatory Visit (INDEPENDENT_AMBULATORY_CARE_PROVIDER_SITE_OTHER): Payer: Medicare Other | Admitting: Ophthalmology

## 2018-06-17 ENCOUNTER — Encounter (INDEPENDENT_AMBULATORY_CARE_PROVIDER_SITE_OTHER): Payer: Self-pay | Admitting: Ophthalmology

## 2018-06-17 DIAGNOSIS — H3321 Serous retinal detachment, right eye: Secondary | ICD-10-CM | POA: Diagnosis not present

## 2018-06-17 DIAGNOSIS — H33101 Unspecified retinoschisis, right eye: Secondary | ICD-10-CM

## 2018-06-17 DIAGNOSIS — H25813 Combined forms of age-related cataract, bilateral: Secondary | ICD-10-CM

## 2018-06-17 DIAGNOSIS — H509 Unspecified strabismus: Secondary | ICD-10-CM

## 2018-06-17 DIAGNOSIS — H31103 Choroidal degeneration, unspecified, bilateral: Secondary | ICD-10-CM

## 2018-06-17 DIAGNOSIS — H3581 Retinal edema: Secondary | ICD-10-CM | POA: Diagnosis not present

## 2018-06-17 DIAGNOSIS — H3122 Choroidal dystrophy (central areolar) (generalized) (peripapillary): Secondary | ICD-10-CM

## 2018-06-17 DIAGNOSIS — H353134 Nonexudative age-related macular degeneration, bilateral, advanced atrophic with subfoveal involvement: Secondary | ICD-10-CM

## 2018-07-14 NOTE — Progress Notes (Addendum)
[facility] Note  07/15/2018     CHIEF COMPLAINT Patient presents for Post-op Follow-up   HISTORY OF PRESENT ILLNESS: Kathy [name] is a 67 y.o. female who presents to the clinic today for:    HPI    Post-op Follow-up    In right eye.  Discomfort includes none.  Negative for pain, itching, foreign body sensation, tearing, discharge and floaters.  Vision is stable.  I, the attending physician,  performed the HPI with the patient and updated documentation appropriately.          Comments    S/P laser retinopexy OD (05.23.19), touch up laser retinopexy OD (06.28.19); pt states she continues to have flashes OD; Pt states OU VA is otherwise normal; Pt denies floaters, denies wavy VA, denies ocular pain;        Last edited by Bernarda Caffey, MD on 07/15/2018 11:32 AM. (History)    Pt states she noticed "some flashes" after the laser but is unsure of when that was;   Referring physician: Denita Lung, MD [address] [place], [place] [address]  HISTORICAL INFORMATION:   Selected notes from the MEDICAL RECORD NUMBER Referred by Dr. Gevena Cotton for concern of central areolar choroidal dystrophy LEE: 05.16.19 (M. Frederico Hamman) [BCVA: OD: 20/30-2 OS: CF] Ocular Hx-Central areolar choroidal dystrophy, Macular Degeneration, exotropia OS, cataracts OU, dry eyes PMH-HTN, Fibromyalgia     CURRENT MEDICATIONS: Current Outpatient Medications (Ophthalmic Drugs)  Medication Sig  . cycloSPORINE (RESTASIS) 0.05 % ophthalmic emulsion Place 1 drop into both eyes 2 (two) times daily.   No current facility-administered medications for this visit.  (Ophthalmic Drugs)   Current Outpatient Medications (Other)  Medication Sig  . albuterol (PROVENTIL HFA;VENTOLIN HFA) 108 (90 Base) MCG/ACT inhaler Inhale 2 puffs into the lungs every 6 (six) hours as needed for wheezing or shortness of breath.  . meloxicam (MOBIC) 7.5 MG tablet Take 1 tablet (7.5 mg total) by  mouth daily.  . Multiple Vitamins-Minerals (PRESERVISION/LUTEIN) CAPS Take by mouth.  . NONFORMULARY OR COMPOUNDED ITEM Apply 1-2 g topically daily. [employer] Nail lacquer: Fluconazole 2%, Terbinafine 1%, DMSO  . Olmesartan-amLODIPine-HCTZ 40-10-12.5 MG TABS TAKE 1 TABLET BY MOUTH EVERY DAY   Current Facility-Administered Medications (Other)  Medication Route  . 0.9 %  sodium chloride infusion Intravenous      REVIEW OF SYSTEMS: ROS    Positive for: Musculoskeletal, Cardiovascular, Eyes   Negative for: Constitutional, Gastrointestinal, Neurological, Skin, Genitourinary, HENT, Endocrine, Respiratory, Psychiatric, Allergic/Imm, Heme/Lymph   Last edited by Cherrie Gauze, COA on 07/15/2018  9:49 AM. (History)       ALLERGIES Allergies  Allergen Reactions  . Lortab [Hydrocodone-Acetaminophen] Nausea And Vomiting  . Latex     Pt unsure of reaction!    PAST MEDICAL HISTORY Past Medical History:  Diagnosis Date  . Exotropia of left eye   . Fibromyalgia   . History of uterine leiomyoma   . Hypertension   . Macular degeneration   . OA (osteoarthritis)   . Overactive bladder   . Sarcoidosis   . Wears glasses    Past Surgical History:  Procedure Laterality Date  . ABDOMINAL HYSTERECTOMY  2013  . ADJUSTABLE SUTURE MANIPULATION Left 04/10/2015   Procedure: ADJUSTABLE SUTURE MANIPULATION;  Surgeon: Gevena Cotton, MD;  Location: [facility];  Service: Ophthalmology;  Laterality: Left;  . EYE SURGERY    . MEDIAN RECTUS REPAIR Left 04/10/2015   Procedure: MEDIAN RECTUS RESECTION LATERAL RECTUS RECESSION,AJUSTABLES  SUTURES LEFT EYE;  Surgeon: Gevena Cotton, MD;  Location: [facility];  Service: Ophthalmology;  Laterality: Left;  . TONSILLECTOMY  as child  . TOTAL ABDOMINAL HYSTERECTOMY W/ BILATERAL SALPINGOOPHORECTOMY  10-14-2010   and TVT midurethral sling and culdoplasty    FAMILY HISTORY Family History  Problem Relation Age of Onset  .  Colon cancer Mother   . Diabetes Father   . Diabetes Sister   . Hypertension Sister   . Diabetes Sister   . Hypertension Sister   . Hypertension Sister     SOCIAL HISTORY Social History   Tobacco Use  . Smoking status: Never Smoker  . Smokeless tobacco: Never Used  Substance Use Topics  . Alcohol use: Yes    Alcohol/week: 1.0 standard drinks    Types: 1 Glasses of wine per week    Comment: rare  . Drug use: No         OPHTHALMIC EXAM:  Base Eye Exam    Visual Acuity (Snellen - Linear)      Right Left   Dist cc 20/40 CF @ 2'   Dist ph cc 20/40 NI   Correction:  Glasses       Tonometry (Tonopen, 9:56 AM)      Right Left   Pressure 14 12       Pupils      Dark Light Shape React APD   Right 5 4 Round Brisk ? Trace   Left 5 4 Round Brisk None       Visual Fields (Counting fingers)      Left Right    Full Full       Extraocular Movement      Right Left    Full, Ortho Full, Ortho       Neuro/Psych    Oriented x3:  Yes   Mood/Affect:  Normal       Dilation    Both eyes:  1.0% Mydriacyl, 2.5% Phenylephrine @ 9:56 AM        Slit Lamp and Fundus Exam    Slit Lamp Exam      Right Left   Lids/Lashes Dermatochalasis - upper lid Dermatochalasis - upper lid   Conjunctiva/Sclera mild Melanosis mild Melanosis   Cornea Arcus Arcus, linear scar at 0430   Anterior Chamber Deep and quiet Deep and quiet   Iris Round and dilated Round and dilated   Lens 2+ Cortical cataract, 2+ Nuclear sclerosis 2-3+ Nuclear sclerosis, 2-3+ Cortical cataract, Vacuoles   Vitreous Vitreous syneresis, Posterior vitreous detachment Vitreous syneresis       Fundus Exam      Right Left   Disc Pink and Sharp Tilted disc, almost 360 degrees of PPA   C/D Ratio 0.2 0.4   Macula large area of RPE and chorodial atrophy with small central foveal island spared large area of choroidal and retinal pigment epithelial atrophy extending to nasal side of disc, Pigment clumping, No heme     Vessels AV crossing changes, Vascular attenuation, Vascular attenuation   Periphery Attached,  vitreous condensations at 0300 with retinal break and +SRF / focal RD within bed of retinoschisis -- stable with good laser surrounding Attached             IMAGING AND PROCEDURES  Imaging and Procedures for '@TODAY'$ @  OCT, Retina - OU - Both Eyes       Right Eye Quality was good. Central Foveal Thickness: 208. Progression has been stable. Findings include abnormal foveal contour,  inner retinal atrophy, outer retinal atrophy, no SRF, no IRF, retinal drusen  (Diffuse geographic atrophy with small central island spared, retinoschisis nasal periphery - stable from prior).   Left Eye Quality was good. Central Foveal Thickness: 211. Progression has been stable. Findings include outer retinal atrophy, inner retinal atrophy, abnormal foveal contour, no IRF, no SRF (Diffuse central atrophy).   Notes *Images captured and stored on drive  Diagnosis / Impression:  Diffuse atrophy OU OD with central island of non-atrophied retina Central areolar choroidal dystrophy OU Retinoschisis nasal periphery OD -- stable from prior  Clinical management:  See below  Abbreviations: NFP - Normal foveal profile. CME - cystoid macular edema. PED - pigment epithelial detachment. IRF - intraretinal fluid. SRF - subretinal fluid. EZ - ellipsoid zone. ERM - epiretinal membrane. ORA - outer retinal atrophy. ORT - outer retinal tubulation. SRHM - subretinal hyper-reflective material                ASSESSMENT/PLAN:    ICD-10-CM   1. Right retinoschisis H33.101 OCT, Retina - OU - Both Eyes  2. Retinal detachment, right H33.21   3. 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 Choroidal atrophy of both eyes H31.103   5. Advanced atrophic nonexudative age-related macular degeneration of both eyes with subfoveal involvement H35.3134   6. Retinal edema H35.81 OCT, Retina - OU - Both Eyes  7. Combined form of  age-related cataract, both eyes H25.813   8. Strabismus H50.9     1,2. Localized, focal retinal detachment / SRF surrounding small retinal tear at 0330, within retinoschisis cavity, RIGHT EYE - asymptomatic, but found on exam - tear located at 0330 OD - S/P laser retinopexy OD (05.23.19), S/P touch up laser retinopexy OD (06.28.19) - good laser in place - no change in SRF/schisis-detachment - f/u in 3 months -- repeat widefield OCT over 0300 schisis cavity  3-5. Severe outer retinal and choroidal atrophy OU - stable today - likely central areolar choroidal dystrophy -- pt notes declining vision that started in her 8-50s - differential also includes age related macular degeneration, non-exudative, both eyes  - The incidence, anatomy, and pathology of dry AMD, risk of progression, and the AREDS and AREDS 2 study including smoking risks discussed with patient.  - continue amsler grid monitoring   6. No retinal edema on exam or OCT  7. Combined form of age-related cataracts OU - The symptoms of cataract, surgical options, and treatments and risks were discussed with patient. - discussed diagnosis and progression - not yet visually significant - monitor  8. Strabismus - under the expert management of Dr. Frederico Hamman - s/p EOM surgery 04/10/15 - monitor  Ophthalmic Meds Ordered this visit:  No orders of the defined types were placed in this encounter.      Return in about 3 months (around 10/15/2018) for F/U laser ret OD, DFE, OCT.  There are no Patient Instructions on file for this visit.   Explained the diagnoses, plan, and follow up with the patient and they expressed understanding.  Patient expressed understanding of the importance of proper follow up care.   This document serves as a record of services personally performed by Gardiner Sleeper, MD, PhD. It was created on their behalf by Ernest Mallick, OA, an ophthalmic assistant. The creation of this record is the provider's  dictation and/or activities during the visit.    Electronically signed by: Ernest Mallick, OA  08.15.2019 12:03 PM    Gardiner Sleeper, M.D., Ph.D.  Diseases & Surgery of the Retina and Vitreous [facility]   I have reviewed the above documentation for accuracy and completeness, and I agree with the above. Gardiner Sleeper, M.D., Ph.D. 07/15/18 4:44 PM   Abbreviations: M myopia (nearsighted); A astigmatism; H hyperopia (farsighted); P presbyopia; Mrx spectacle prescription;  CTL contact lenses; OD right eye; OS left eye; OU both eyes  XT exotropia; ET esotropia; PEK punctate epithelial keratitis; PEE punctate epithelial erosions; DES dry eye syndrome; MGD meibomian gland dysfunction; ATs artificial tears; PFAT's preservative free artificial tears; [place] nuclear sclerotic cataract; PSC posterior subcapsular cataract; ERM epi-retinal membrane; PVD posterior vitreous detachment; RD retinal detachment; DM diabetes mellitus; DR diabetic retinopathy; NPDR non-proliferative diabetic retinopathy; PDR proliferative diabetic retinopathy; CSME clinically significant macular edema; DME diabetic macular edema; dbh dot blot hemorrhages; CWS cotton wool spot; POAG primary open angle glaucoma; C/D cup-to-disc ratio; HVF humphrey visual field; GVF goldmann visual field; OCT optical coherence tomography; IOP intraocular pressure; BRVO Branch retinal vein occlusion; CRVO central retinal vein occlusion; CRAO central retinal artery occlusion; BRAO branch retinal artery occlusion; RT retinal tear; SB scleral buckle; PPV pars plana vitrectomy; VH Vitreous hemorrhage; PRP panretinal laser photocoagulation; IVK intravitreal kenalog; VMT vitreomacular traction; MH Macular hole;  NVD neovascularization of the disc; NVE neovascularization elsewhere; AREDS age related eye disease study; ARMD age related macular degeneration; POAG primary open angle glaucoma; EBMD epithelial/anterior basement membrane dystrophy;  ACIOL anterior chamber intraocular lens; IOL intraocular lens; PCIOL posterior chamber intraocular lens; Phaco/IOL phacoemulsification with intraocular lens placement; [place] photorefractive keratectomy; LASIK laser assisted in situ keratomileusis; HTN hypertension; DM diabetes mellitus; COPD chronic obstructive pulmonary disease

## 2018-07-15 ENCOUNTER — Ambulatory Visit (INDEPENDENT_AMBULATORY_CARE_PROVIDER_SITE_OTHER): Payer: Medicare Other | Admitting: Ophthalmology

## 2018-07-15 ENCOUNTER — Encounter (INDEPENDENT_AMBULATORY_CARE_PROVIDER_SITE_OTHER): Payer: Self-pay | Admitting: Ophthalmology

## 2018-07-15 DIAGNOSIS — H3321 Serous retinal detachment, right eye: Secondary | ICD-10-CM

## 2018-07-15 DIAGNOSIS — H3122 Choroidal dystrophy (central areolar) (generalized) (peripapillary): Secondary | ICD-10-CM

## 2018-07-15 DIAGNOSIS — H31103 Choroidal degeneration, unspecified, bilateral: Secondary | ICD-10-CM | POA: Diagnosis not present

## 2018-07-15 DIAGNOSIS — H33101 Unspecified retinoschisis, right eye: Secondary | ICD-10-CM | POA: Diagnosis not present

## 2018-07-15 DIAGNOSIS — H25813 Combined forms of age-related cataract, bilateral: Secondary | ICD-10-CM

## 2018-07-15 DIAGNOSIS — H353134 Nonexudative age-related macular degeneration, bilateral, advanced atrophic with subfoveal involvement: Secondary | ICD-10-CM

## 2018-07-15 DIAGNOSIS — H3581 Retinal edema: Secondary | ICD-10-CM | POA: Diagnosis not present

## 2018-07-15 DIAGNOSIS — H509 Unspecified strabismus: Secondary | ICD-10-CM

## 2018-08-08 ENCOUNTER — Other Ambulatory Visit: Payer: Self-pay | Admitting: Family Medicine

## 2018-08-08 DIAGNOSIS — N318 Other neuromuscular dysfunction of bladder: Secondary | ICD-10-CM

## 2018-08-10 ENCOUNTER — Other Ambulatory Visit: Payer: Self-pay | Admitting: Family Medicine

## 2018-08-10 DIAGNOSIS — I10 Essential (primary) hypertension: Secondary | ICD-10-CM

## 2018-08-24 ENCOUNTER — Encounter: Payer: Self-pay | Admitting: Family Medicine

## 2018-08-24 ENCOUNTER — Ambulatory Visit (INDEPENDENT_AMBULATORY_CARE_PROVIDER_SITE_OTHER): Payer: Medicare Other | Admitting: Family Medicine

## 2018-08-24 VITALS — BP 122/80 | HR 71 | Temp 97.8°F | Ht 63.0 in | Wt 176.6 lb

## 2018-08-24 DIAGNOSIS — E2839 Other primary ovarian failure: Secondary | ICD-10-CM

## 2018-08-24 DIAGNOSIS — I10 Essential (primary) hypertension: Secondary | ICD-10-CM | POA: Diagnosis not present

## 2018-08-24 DIAGNOSIS — Z23 Encounter for immunization: Secondary | ICD-10-CM | POA: Diagnosis not present

## 2018-08-24 DIAGNOSIS — H3530 Unspecified macular degeneration: Secondary | ICD-10-CM

## 2018-08-24 DIAGNOSIS — M1990 Unspecified osteoarthritis, unspecified site: Secondary | ICD-10-CM

## 2018-08-24 DIAGNOSIS — E785 Hyperlipidemia, unspecified: Secondary | ICD-10-CM

## 2018-08-24 DIAGNOSIS — J301 Allergic rhinitis due to pollen: Secondary | ICD-10-CM

## 2018-08-24 DIAGNOSIS — M797 Fibromyalgia: Secondary | ICD-10-CM

## 2018-08-24 DIAGNOSIS — H5010 Unspecified exotropia: Secondary | ICD-10-CM

## 2018-08-24 DIAGNOSIS — Z0000 Encounter for general adult medical examination without abnormal findings: Secondary | ICD-10-CM

## 2018-08-24 DIAGNOSIS — D869 Sarcoidosis, unspecified: Secondary | ICD-10-CM

## 2018-08-24 MED ORDER — OLMESARTAN-AMLODIPINE-HCTZ 40-10-12.5 MG PO TABS: ORAL_TABLET | ORAL | 3 refills | Status: DC

## 2018-08-24 NOTE — Progress Notes (Signed)
Kathy [name] is a 67 y.o. female who presents for annual wellness,CPE and follow-up on chronic medical conditions.  She has the macular degeneration and is followed by her ophthalmologist.  Apparently she is blind in the left eye.  She continues have left knee arthritis and does use meloxicam for that.  She does have a family history of colon cancer and is Artie had a colonoscopy.  She is on a 5-year schedule for that.  Her allergies are under good control.  She states that the fibromyalgia causes very little difficulty and has not had symptoms of sarcoidosis in quite some time.  She continues on olmesartan/amlodipine.  She does have a history of hyperlipidemia.  She keeps himself quite physically active.  Smoking and drinking were reviewed and are not an issue.  Immunizations and Health Maintenance Immunization History  Administered Date(s) Administered  . Influenza Split 08/28/2011, 09/23/2012  . Influenza, High Dose Seasonal PF 09/30/2016, 09/01/2017  . Influenza,inj,Quad PF,6+ Mos 09/13/2013, 08/30/2014, 09/10/2015  . Pneumococcal Conjugate-13 08/30/2014  . Pneumococcal Polysaccharide-23 02/03/2016  . Tdap 02/03/2016  . Zoster 01/28/2012   Health Maintenance Due  Topic Date Due  . DEXA SCAN  08/12/2016  . INFLUENZA VACCINE  06/30/2018    Last Pap smear:had hysterectomy Last mammogram:12-2017 Last colonoscopy: 2018 Last DEXA: never Dentist: 02/2018 Ophtho: 06/2018 Exercise: qd   Other doctors caring for patient include:[facility]  Advanced directives: No.  Information given.    Depression screen:  See questionnaire below.  Depression screen [facility] 2/9 08/24/2018 04/13/2017 02/03/2016  Decreased Interest 0 0 0  Down, Depressed, Hopeless 0 0 0  PHQ - 2 Score 0 0 0    Fall Risk Screen: see questionnaire below. Fall Risk  04/13/2017  Falls in the past year? No    ADL screen:  See questionnaire below Functional Status Survey: Is the patient deaf or have difficulty hearing?: No Does  the patient have difficulty seeing, even when wearing glasses/contacts?: Yes Does the patient have difficulty concentrating, remembering, or making decisions?: No Does the patient have difficulty walking or climbing stairs?: No Does the patient have difficulty dressing or bathing?: No Does the patient have difficulty doing errands alone such as visiting a doctor's office or shopping?: No   Review of Systems Constitutional: -, -unexpected weight change, -anorexia, -fatigue Allergy: -sneezing, -itching, -congestion Dermatology: denies changing moles, rash, lumps ENT: -runny nose, -ear pain, -sore throat,  Cardiology:  -chest pain, -palpitations, -orthopnea, Respiratory: -cough, -shortness of breath, -dyspnea on exertion, -wheezing,  Gastroenterology: -abdominal pain, -nausea, -vomiting, -diarrhea, -constipation, -dysphagia Hematology: -bleeding or bruising problems Musculoskeletal: -arthralgias, -myalgias, -joint swelling, -back pain, - Ophthalmology: -vision changes,  Urology: -dysuria, -difficulty urinating,  -urinary frequency, -urgency, incontinence Neurology: -, -numbness, , -memory loss, -falls, -dizziness    PHYSICAL EXAM:  BP 122/80 (BP Location: Left Arm, Patient Position: Sitting)   Pulse 71   Temp 97.8 F (36.6 C)   Ht 5\' 3"  (1.6 m)   Wt 176 lb 9.6 oz (80.1 kg)   SpO2 98%   BMI 31.28 kg/m   General Appearance: Alert, cooperative, no distress, appears stated age Head: Normocephalic, without obvious abnormality, atraumatic Eyes: PERRL, conjunctiva/corneas clear, EOM's intact, fundi benign Ears: Normal TM's and external ear canals Nose: Nares normal, mucosa normal, no drainage or sinus tenderness Throat: Lips, mucosa, and tongue normal; teeth and gums normal Neck: Supple, no lymphadenopathy;  thyroid:  no enlargement/tenderness/nodules; no carotid bruit or JVD Lungs: Clear to auscultation bilaterally without wheezes, rales or ronchi; respirations  unlabored Heart:  Regular rate and rhythm, S1 and S2 normal, no murmur, rubor gallop Abdomen: Soft, non-tender, nondistended, normoactive bowel sounds,  no masses, no hepatosplenomegaly Extremities: No clubbing, cyanosis or edema Pulses: 2+ and symmetric all extremities Skin:  Skin color, texture, turgor normal, no rashes or lesions Lymph nodes: Cervical, supraclavicular, and axillary nodes normal Neurologic:  CNII-XII intact, normal strength, sensation and gait; reflexes 2+ and symmetric throughout Psych: Normal mood, affect, hygiene and grooming.  ASSESSMENT/PLAN: Routine general medical examination at a health care facility  Need for influenza vaccination - Plan: Flu vaccine HIGH DOSE PF (Fluzone High dose)  Allergic rhinitis due to pollen, unspecified seasonality  Essential hypertension - Plan: CBC with Differential/Platelet, Comprehensive metabolic panel, Olmesartan-amLODIPine-HCTZ 40-10-12.5 MG TABS  Exotropia  Macular degeneration, unspecified laterality, unspecified type  Fibromyalgia  Sarcoidosis  Arthritis  Hyperlipidemia, unspecified hyperlipidemia type - Plan: Lipid panel  Estrogen deficiency - Plan: DG Bone Density Encouraged her to remain physically active.  She will continue to get her colonoscopies and follow-up with her ophthalmologist.  Continue on meloxicam on an as-needed basis.  Discussed mammograms with her as well as general health maintenance.     Medicare Attestation I have personally reviewed: The patient's medical and social history Their use of alcohol, tobacco or illicit drugs Their current medications and supplements The patient's functional ability including ADLs,fall risks, home safety risks, cognitive, and hearing and visual impairment Diet and physical activities Evidence for depression or mood disorders  The patient's weight, height, and BMI have been recorded in the chart.  I have made referrals, counseling, and provided education to the patient based  on review of the above and I have provided the patient with a written personalized care plan for preventive services.     Jill Alexanders, MD   08/24/2018

## 2018-08-25 LAB — CBC WITH DIFFERENTIAL/PLATELET
BASOS: 1 %
Basophils Absolute: 0.1 10*3/uL (ref 0.0–0.2)
EOS (ABSOLUTE): 0.1 10*3/uL (ref 0.0–0.4)
EOS: 3 %
HEMATOCRIT: 42.3 % (ref 34.0–46.6)
HEMOGLOBIN: 13.7 g/dL (ref 11.1–15.9)
IMMATURE GRANS (ABS): 0 10*3/uL (ref 0.0–0.1)
Immature Granulocytes: 0 %
LYMPHS ABS: 1.8 10*3/uL (ref 0.7–3.1)
LYMPHS: 40 %
MCH: 28.4 pg (ref 26.6–33.0)
MCHC: 32.4 g/dL (ref 31.5–35.7)
MCV: 88 fL (ref 79–97)
MONOCYTES: 12 %
Monocytes Absolute: 0.6 10*3/uL (ref 0.1–0.9)
NEUTROS ABS: 2 10*3/uL (ref 1.4–7.0)
Neutrophils: 44 %
Platelets: 278 10*3/uL (ref 150–450)
RBC: 4.82 x10E6/uL (ref 3.77–5.28)
RDW: 12.5 % (ref 12.3–15.4)
WBC: 4.5 10*3/uL (ref 3.4–10.8)

## 2018-08-25 LAB — COMPREHENSIVE METABOLIC PANEL
ALBUMIN: 4.8 g/dL (ref 3.6–4.8)
ALT: 12 IU/L (ref 0–32)
AST: 23 IU/L (ref 0–40)
Albumin/Globulin Ratio: 1.9 (ref 1.2–2.2)
Alkaline Phosphatase: 90 IU/L (ref 39–117)
BUN/Creatinine Ratio: 32 — ABNORMAL HIGH (ref 12–28)
BUN: 21 mg/dL (ref 8–27)
Bilirubin Total: 0.4 mg/dL (ref 0.0–1.2)
CO2: 25 mmol/L (ref 20–29)
CREATININE: 0.65 mg/dL (ref 0.57–1.00)
Calcium: 9.9 mg/dL (ref 8.7–10.3)
Chloride: 100 mmol/L (ref 96–106)
GFR, EST AFRICAN AMERICAN: 106 mL/min/{1.73_m2} (ref >59)
GFR, EST NON AFRICAN AMERICAN: 92 mL/min/{1.73_m2} (ref >59)
GLOBULIN, TOTAL: 2.5 g/dL (ref 1.5–4.5)
GLUCOSE: 87 mg/dL (ref 65–99)
Potassium: 4.1 mmol/L (ref 3.5–5.2)
SODIUM: 143 mmol/L (ref 134–144)
TOTAL PROTEIN: 7.3 g/dL (ref 6.0–8.5)

## 2018-08-25 LAB — LIPID PANEL
CHOL/HDL RATIO: 2.8 ratio (ref 0.0–4.4)
Cholesterol, Total: 254 mg/dL — ABNORMAL HIGH (ref 100–199)
HDL: 91 mg/dL (ref >39)
LDL CALC: 148 mg/dL — AB (ref 0–99)
Triglycerides: 74 mg/dL (ref 0–149)
VLDL Cholesterol Cal: 15 mg/dL (ref 5–40)

## 2018-10-18 NOTE — Progress Notes (Signed)
[facility] Note  10/19/2018     CHIEF COMPLAINT Patient presents for Retina Evaluation   HISTORY OF PRESENT ILLNESS: Kathy [name] is a 67 y.o. female who presents to the clinic today for:    HPI    Retina Evaluation    I, the attending physician,  performed the HPI with the patient and updated documentation appropriately.          Comments    Patient returned for 3 month follow up for Retinoschisis OD. Patient states vision doing the same. Has no eye pain.       Last edited by Bernarda Caffey, MD on 10/19/2018 11:12 AM. (History)    Pt states she noticed "some flashes" after the laser but is unsure of when that was;   Referring physician: Denita Lung, MD [address] [place], [place] [address]  HISTORICAL INFORMATION:   Selected notes from the MEDICAL RECORD NUMBER Referred by Dr. Gevena Cotton for concern of central areolar choroidal dystrophy LEE: 05.16.19 (M. Frederico Hamman) [BCVA: OD: 20/30-2 OS: CF] Ocular Hx-Central areolar choroidal dystrophy, Macular Degeneration, exotropia OS, cataracts OU, dry eyes PMH-HTN, Fibromyalgia     CURRENT MEDICATIONS: Current Outpatient Medications (Ophthalmic Drugs)  Medication Sig  . cycloSPORINE (RESTASIS) 0.05 % ophthalmic emulsion Place 1 drop into both eyes 2 (two) times daily.   No current facility-administered medications for this visit.  (Ophthalmic Drugs)   Current Outpatient Medications (Other)  Medication Sig  . albuterol (PROVENTIL HFA;VENTOLIN HFA) 108 (90 Base) MCG/ACT inhaler Inhale 2 puffs into the lungs every 6 (six) hours as needed for wheezing or shortness of breath.  . meloxicam (MOBIC) 7.5 MG tablet Take 1 tablet (7.5 mg total) by mouth daily.  . Multiple Vitamins-Minerals (PRESERVISION/LUTEIN) CAPS Take by mouth.  . NONFORMULARY OR COMPOUNDED ITEM Apply 1-2 g topically daily. [employer] Nail lacquer: Fluconazole 2%, Terbinafine 1%, DMSO (Patient not taking: Reported  on 08/24/2018)  . Olmesartan-amLODIPine-HCTZ 40-10-12.5 MG TABS TAKE 1 TABLET BY MOUTH EVERY DAY  . Omega-3 Fatty Acids (FISH OIL) 1000 MG CAPS Take 1 capsule by mouth.   Current Facility-Administered Medications (Other)  Medication Route  . 0.9 %  sodium chloride infusion Intravenous      REVIEW OF SYSTEMS: ROS    Positive for: Cardiovascular, Eyes, Respiratory   Negative for: Constitutional, Gastrointestinal, Neurological, Skin, Genitourinary, Musculoskeletal, HENT, Endocrine, Psychiatric, Allergic/Imm, Heme/Lymph   Last edited by Bernarda Caffey, MD on 10/22/2018 12:50 AM. (History)       ALLERGIES Allergies  Allergen Reactions  . Lortab [Hydrocodone-Acetaminophen] Nausea And Vomiting  . Latex     Pt unsure of reaction!    PAST MEDICAL HISTORY Past Medical History:  Diagnosis Date  . Exotropia of left eye   . Fibromyalgia   . History of uterine leiomyoma   . Hypertension   . Macular degeneration   . OA (osteoarthritis)   . Overactive bladder   . Sarcoidosis   . Wears glasses    Past Surgical History:  Procedure Laterality Date  . ABDOMINAL HYSTERECTOMY  2013  . ADJUSTABLE SUTURE MANIPULATION Left 04/10/2015   Procedure: ADJUSTABLE SUTURE MANIPULATION;  Surgeon: Gevena Cotton, MD;  Location: [facility];  Service: Ophthalmology;  Laterality: Left;  . EYE SURGERY    . MEDIAN RECTUS REPAIR Left 04/10/2015   Procedure: MEDIAN RECTUS RESECTION LATERAL RECTUS RECESSION,AJUSTABLES SUTURES LEFT EYE;  Surgeon: Gevena Cotton, MD;  Location: [facility];  Service: Ophthalmology;  Laterality: Left;  .  TONSILLECTOMY  as child  . TOTAL ABDOMINAL HYSTERECTOMY W/ BILATERAL SALPINGOOPHORECTOMY  10-14-2010   and TVT midurethral sling and culdoplasty    FAMILY HISTORY Family History  Problem Relation Age of Onset  . Colon cancer Mother   . Diabetes Father   . Diabetes Sister   . Hypertension Sister   . Diabetes Sister   . Hypertension Sister    . Hypertension Sister     SOCIAL HISTORY Social History   Tobacco Use  . Smoking status: Never Smoker  . Smokeless tobacco: Never Used  Substance Use Topics  . Alcohol use: Yes    Alcohol/week: 1.0 standard drinks    Types: 1 Glasses of wine per week    Comment: rare  . Drug use: No         OPHTHALMIC EXAM:  Base Eye Exam    Visual Acuity (Snellen - Linear)      Right Left   Dist cc 20/40 -2 CF 2'   Dist ph cc NI NI   Correction:  Glasses       Tonometry (Tonopen, 9:23 AM)      Right Left   Pressure 16 17       Pupils      Dark Light Shape React APD   Right 5 4 Round Brisk Trace   Left 5 4 Round Brisk None       Visual Fields (Counting fingers)      Left Right    Full Full       Extraocular Movement      Right Left    Full, Ortho Full, Ortho       Neuro/Psych    Oriented x3:  Yes   Mood/Affect:  Normal       Dilation    Both eyes:  1.0% Mydriacyl, 2.5% Phenylephrine @ 9:24 AM        Slit Lamp and Fundus Exam    Slit Lamp Exam      Right Left   Lids/Lashes Dermatochalasis - upper lid Dermatochalasis - upper lid   Conjunctiva/Sclera mild Melanosis mild Melanosis   Cornea Arcus, 1+ PEE Arcus, linear scar at 0430   Anterior Chamber Deep and quiet Deep and quiet   Iris Round and dilated Round and dilated   Lens 2+ Cortical cataract, 2+ Nuclear sclerosis, + vacuoles 2-3+ Nuclear sclerosis, 2-3+ Cortical cataract, Vacuoles   Vitreous Vitreous syneresis, Posterior vitreous detachment Vitreous syneresis       Fundus Exam      Right Left   Disc Pink and Sharp Tilted disc, almost 360 degrees of PPA   C/D Ratio 0.2 0.4   Macula large area of RPE and chorodial atrophy with small central foveal island spared; central island w/pigment clumping around edges. large area of choroidal and retinal pigment epithelial atrophy extending to nasal side of disc, Pigment clumping, No heme    Vessels AV crossing changes, Vascular attenuation, Vascular attenuation    Periphery Attached,  vitreous condensations at 0300 with retinal break and +SRF / focal RD within bed of retinoschisis -- stable with good laser surrounding Attached           Refraction    Wearing Rx      Sphere Cylinder Axis Add   Right +0.25 +0.50 052 +2.50   Left +0.25 Sphere  +2.50   Type:  PAL          IMAGING AND PROCEDURES  Imaging and Procedures for @TODAY @  OCT, Retina -  OU - Both Eyes       Right Eye Quality was good. Central Foveal Thickness: 206. Progression has been stable. Findings include abnormal foveal contour, inner retinal atrophy, outer retinal atrophy, no SRF, no IRF, retinal drusen  (Diffuse geographic atrophy with small central island spared, retinoschisis nasal periphery - stable from prior).   Left Eye Quality was good. Central Foveal Thickness: 197. Progression has improved. Findings include outer retinal atrophy, inner retinal atrophy, abnormal foveal contour, no IRF, no SRF (Diffuse central atrophy).   Notes *Images captured and stored on drive  Diagnosis / Impression:  Stable from prior Diffuse atrophy OU OD with central island of non-atrophied retina Central areolar choroidal dystrophy OU Retinoschisis nasal periphery OD  Clinical management:  See below  Abbreviations: NFP - Normal foveal profile. CME - cystoid macular edema. PED - pigment epithelial detachment. IRF - intraretinal fluid. SRF - subretinal fluid. EZ - ellipsoid zone. ERM - epiretinal membrane. ORA - outer retinal atrophy. ORT - outer retinal tubulation. SRHM - subretinal hyper-reflective material                ASSESSMENT/PLAN:    ICD-10-CM   1. Right retinoschisis H33.101   2. Retinal detachment, right H33.21   3. 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 Choroidal atrophy of both eyes H31.103   5. Advanced atrophic nonexudative age-related macular degeneration of both eyes with subfoveal involvement H35.3134   6. Retinal edema H35.81 OCT, Retina - OU -  Both Eyes  7. Combined form of age-related cataract, both eyes H25.813   8. Strabismus H50.9     1,2. Localized, focal retinal detachment / SRF surrounding small retinal tear at 0330, within retinoschisis cavity, RIGHT EYE - asymptomatic, but found on exam - tear located at 0330 OD - S/P laser retinopexy OD (05.23.19), S/P touch up laser retinopexy OD (06.28.19) - good laser in place - no change in SRF/schisis-detachment - f/u in 4 months -- repeat widefield OCT over 0300 schisis cavity  3-5. Severe outer retinal and choroidal atrophy OU - stable today - likely central areolar choroidal dystrophy -- pt notes declining vision that started in her 80-50s - differential also includes age related macular degeneration, non-exudative, both eyes  - The incidence, anatomy, and pathology of dry AMD, risk of progression, and the AREDS and AREDS 2 study including smoking risks discussed with patient.  - continue amsler grid monitoring   6. No retinal edema on exam or OCT  7. Combined form of age-related cataracts OU - The symptoms of cataract, surgical options, and treatments and risks were discussed with patient. - discussed diagnosis and progression - not yet visually significant - monitor  8. Strabismus - under the expert management of Dr. Frederico Hamman - s/p EOM surgery 04/10/15 - monitor  Ophthalmic Meds Ordered this visit:  No orders of the defined types were placed in this encounter.      Return in about 4 months (around 02/17/2019) for 3 mo f/u for focal RD/SRF OD; s/p laser OD.  OCT and DFE OU..  There are no Patient Instructions on file for this visit.   Explained the diagnoses, plan, and follow up with the patient and they expressed understanding.  Patient expressed understanding of the importance of proper follow up care.   This document serves as a record of services personally performed by Gardiner Sleeper, MD, PhD. It was created on their behalf by Ernest Mallick, OA, an  ophthalmic assistant. The creation of this record is  the provider's dictation and/or activities during the visit.    Electronically signed by: Ernest Mallick, OA  11.19.19 12:52 AM    Gardiner Sleeper, M.D., Ph.D. Diseases & Surgery of the Retina and Vitreous [facility]  I have reviewed the above documentation for accuracy and completeness, and I agree with the above. Gardiner Sleeper, M.D., Ph.D. 10/22/18 12:53 AM   Abbreviations: M myopia (nearsighted); A astigmatism; H hyperopia (farsighted); P presbyopia; Mrx spectacle prescription;  CTL contact lenses; OD right eye; OS left eye; OU both eyes  XT exotropia; ET esotropia; PEK punctate epithelial keratitis; PEE punctate epithelial erosions; DES dry eye syndrome; MGD meibomian gland dysfunction; ATs artificial tears; PFAT's preservative free artificial tears; [place] nuclear sclerotic cataract; PSC posterior subcapsular cataract; ERM epi-retinal membrane; PVD posterior vitreous detachment; RD retinal detachment; DM diabetes mellitus; DR diabetic retinopathy; NPDR non-proliferative diabetic retinopathy; PDR proliferative diabetic retinopathy; CSME clinically significant macular edema; DME diabetic macular edema; dbh dot blot hemorrhages; CWS cotton wool spot; POAG primary open angle glaucoma; C/D cup-to-disc ratio; HVF humphrey visual field; GVF goldmann visual field; OCT optical coherence tomography; IOP intraocular pressure; BRVO Branch retinal vein occlusion; CRVO central retinal vein occlusion; CRAO central retinal artery occlusion; BRAO branch retinal artery occlusion; RT retinal tear; SB scleral buckle; PPV pars plana vitrectomy; VH Vitreous hemorrhage; PRP panretinal laser photocoagulation; IVK intravitreal kenalog; VMT vitreomacular traction; MH Macular hole;  NVD neovascularization of the disc; NVE neovascularization elsewhere; AREDS age related eye disease study; ARMD age related macular degeneration; POAG primary open angle  glaucoma; EBMD epithelial/anterior basement membrane dystrophy; ACIOL anterior chamber intraocular lens; IOL intraocular lens; PCIOL posterior chamber intraocular lens; Phaco/IOL phacoemulsification with intraocular lens placement; [place] photorefractive keratectomy; LASIK laser assisted in situ keratomileusis; HTN hypertension; DM diabetes mellitus; COPD chronic obstructive pulmonary disease

## 2018-10-19 ENCOUNTER — Ambulatory Visit (INDEPENDENT_AMBULATORY_CARE_PROVIDER_SITE_OTHER): Payer: Medicare Other | Admitting: Ophthalmology

## 2018-10-19 DIAGNOSIS — H33101 Unspecified retinoschisis, right eye: Secondary | ICD-10-CM

## 2018-10-19 DIAGNOSIS — H25813 Combined forms of age-related cataract, bilateral: Secondary | ICD-10-CM

## 2018-10-19 DIAGNOSIS — H3122 Choroidal dystrophy (central areolar) (generalized) (peripapillary): Secondary | ICD-10-CM | POA: Diagnosis not present

## 2018-10-19 DIAGNOSIS — H31103 Choroidal degeneration, unspecified, bilateral: Secondary | ICD-10-CM

## 2018-10-19 DIAGNOSIS — H353134 Nonexudative age-related macular degeneration, bilateral, advanced atrophic with subfoveal involvement: Secondary | ICD-10-CM

## 2018-10-19 DIAGNOSIS — H3321 Serous retinal detachment, right eye: Secondary | ICD-10-CM

## 2018-10-19 DIAGNOSIS — H3581 Retinal edema: Secondary | ICD-10-CM

## 2018-10-19 DIAGNOSIS — H509 Unspecified strabismus: Secondary | ICD-10-CM

## 2018-10-20 ENCOUNTER — Ambulatory Visit
Admission: RE | Admit: 2018-10-20 | Discharge: 2018-10-20 | Disposition: A | Discharge status: Home / Self Care | Payer: Medicare Other | Source: Ambulatory Visit | Attending: Family Medicine | Admitting: Family Medicine

## 2018-10-20 DIAGNOSIS — M85851 Other specified disorders of bone density and structure, right thigh: Secondary | ICD-10-CM | POA: Diagnosis not present

## 2018-10-20 DIAGNOSIS — Z780 Asymptomatic menopausal state: Secondary | ICD-10-CM | POA: Diagnosis not present

## 2018-10-22 ENCOUNTER — Encounter (INDEPENDENT_AMBULATORY_CARE_PROVIDER_SITE_OTHER): Payer: Self-pay | Admitting: Ophthalmology

## 2018-11-03 ENCOUNTER — Other Ambulatory Visit: Payer: Self-pay | Admitting: Family Medicine

## 2018-11-03 DIAGNOSIS — N318 Other neuromuscular dysfunction of bladder: Secondary | ICD-10-CM

## 2018-11-03 NOTE — Telephone Encounter (Signed)
Is this okay to refill? 

## 2019-01-05 ENCOUNTER — Ambulatory Visit (INDEPENDENT_AMBULATORY_CARE_PROVIDER_SITE_OTHER): Payer: Medicare Other | Admitting: Family Medicine

## 2019-01-05 ENCOUNTER — Encounter: Payer: Self-pay | Admitting: Family Medicine

## 2019-01-05 ENCOUNTER — Ambulatory Visit
Admission: RE | Admit: 2019-01-05 | Discharge: 2019-01-05 | Disposition: A | Discharge status: Home / Self Care | Payer: Medicare Other | Source: Ambulatory Visit | Attending: Family Medicine | Admitting: Family Medicine

## 2019-01-05 VITALS — BP 128/82 | HR 70 | Temp 97.6°F | Wt 178.8 lb

## 2019-01-05 DIAGNOSIS — M1712 Unilateral primary osteoarthritis, left knee: Secondary | ICD-10-CM | POA: Diagnosis not present

## 2019-01-05 DIAGNOSIS — M25562 Pain in left knee: Secondary | ICD-10-CM

## 2019-01-05 DIAGNOSIS — R142 Eructation: Secondary | ICD-10-CM | POA: Diagnosis not present

## 2019-01-05 NOTE — Progress Notes (Signed)
   Subjective:    Patient ID: Kathy [name], female    DOB: [birth year], 68 y.o.   MRN: [id number]  HPI She is here for evaluation of excessive eructation and gas with some slight pain.  This occurs every time she eats and it does not matter what she eats.  She is tried Tums, Gaviscon and Gas-X without success.  She cannot relate this to greasy foods or milk or milk products. She also has a long history of difficulty with knee pain most recently with her left knee.  This is been going on for at least a year.  Recently she did have a popping sensation.  This interferes with her ability to function.  No locking or grinding.   Review of Systems     Objective:   Physical Exam Alert and in no distress. Tympanic membranes and canals are normal. Pharyngeal area is normal. Neck is supple without adenopathy or thyromegaly. Cardiac exam shows a regular sinus rhythm without murmurs or gallops. Lungs are clear to auscultation.  Abdominal exam shows no masses or tenderness. Left knee exam shows no effusion.  Ligaments intact.  Negative McMurray's testing.  Negative anterior drawer.       Assessment & Plan:  Left knee pain, unspecified chronicity - Plan: DG Knee Complete 4 Views Left  Eructation - Plan: US Abdomen Limited RUQ I explained that the knee pain is probably arthritic changes.  I will get an x-ray on this.  We then discussed an injection.  She has had these in the past and would like it.  The knee was prepped laterally with Betadine.  40 mg of Kenalog and 3 cc of Xylocaine was injected into the knee without difficulty.  She obtained relatively quick relief of her symptoms. Follow-up on the ultrasound after she completes it.

## 2019-01-11 ENCOUNTER — Ambulatory Visit
Admission: RE | Admit: 2019-01-11 | Discharge: 2019-01-11 | Disposition: A | Discharge status: Home / Self Care | Payer: Medicare Other | Source: Ambulatory Visit | Attending: Family Medicine | Admitting: Family Medicine

## 2019-01-11 DIAGNOSIS — R109 Unspecified abdominal pain: Secondary | ICD-10-CM | POA: Diagnosis not present

## 2019-01-11 DIAGNOSIS — R142 Eructation: Secondary | ICD-10-CM | POA: Diagnosis not present

## 2019-01-12 ENCOUNTER — Other Ambulatory Visit: Payer: Self-pay

## 2019-01-12 DIAGNOSIS — R1011 Right upper quadrant pain: Secondary | ICD-10-CM

## 2019-01-17 ENCOUNTER — Telehealth: Payer: Self-pay | Admitting: Family Medicine

## 2019-01-17 NOTE — Telephone Encounter (Signed)
Pt called to follow up on a GI test she was supposed to have done at [facility]. Pt has not heard from them and was making sure everything was ready so she could have the test done.

## 2019-01-17 NOTE — Telephone Encounter (Signed)
Needs follow-up on this

## 2019-01-18 NOTE — Telephone Encounter (Signed)
Pt was advised of Hida scan date 01-27-19 at 7 am at [facility]. [place]

## 2019-01-27 ENCOUNTER — Ambulatory Visit (HOSPITAL_COMMUNITY)
Admission: RE | Admit: 2019-01-27 | Discharge: 2019-01-27 | Disposition: A | Discharge status: Home / Self Care | Payer: Medicare Other | Source: Ambulatory Visit | Attending: Family Medicine | Admitting: Family Medicine

## 2019-01-27 ENCOUNTER — Encounter (HOSPITAL_COMMUNITY): Payer: Self-pay | Admitting: Radiology

## 2019-01-27 DIAGNOSIS — R1011 Right upper quadrant pain: Secondary | ICD-10-CM | POA: Diagnosis not present

## 2019-01-27 MED ORDER — TECHNETIUM TC 99M MEBROFENIN IV KIT
5.1000 | PACK | Freq: Once | INTRAVENOUS | Status: AC | PRN
  Administered 2019-01-27: 5.1 via INTRAVENOUS

## 2019-01-28 ENCOUNTER — Other Ambulatory Visit: Payer: Self-pay | Admitting: Family Medicine

## 2019-01-28 DIAGNOSIS — N318 Other neuromuscular dysfunction of bladder: Secondary | ICD-10-CM

## 2019-01-30 NOTE — Telephone Encounter (Signed)
[facility] is requesting to fill pt meloxicam. Please advise K[facility] 

## 2019-02-02 ENCOUNTER — Ambulatory Visit (INDEPENDENT_AMBULATORY_CARE_PROVIDER_SITE_OTHER): Payer: Medicare Other | Admitting: Family Medicine

## 2019-02-02 ENCOUNTER — Encounter: Payer: Self-pay | Admitting: Family Medicine

## 2019-02-02 VITALS — BP 120/82 | HR 77 | Temp 97.8°F | Wt 181.8 lb

## 2019-02-02 DIAGNOSIS — R142 Eructation: Secondary | ICD-10-CM | POA: Diagnosis not present

## 2019-02-02 DIAGNOSIS — M1990 Unspecified osteoarthritis, unspecified site: Secondary | ICD-10-CM | POA: Diagnosis not present

## 2019-02-02 NOTE — Progress Notes (Signed)
   Subjective:    Patient ID: Kathy [name], female    DOB: 28-Nov-1951, 68 y.o.   MRN: [id number]  HPI She is here for evaluation of continued difficulty with abdominal bloating as well as flatulence.  She notes that this tends to occur when she eats but cannot relate test to milk or greasy foods.  She does note salmon sometimes do this.  She has used probiotic, Gas-X and does use yogurt without difficulty.  We have done a gallbladder ultrasound as well as HIDA which was negative. She also continues have difficulty with left knee pain.  She did have an injection recently however her symptoms have recurred.  X-rays do show medial compartment changes.   Review of Systems     Objective:   Physical Exam Alert and in no distress otherwise not examined      Assessment & Plan:  Arthritis - Plan: Ambulatory referral to Orthopedic Surgery  Eructation I will send her to [place] who she has seen in the past.  Discussed various options with her concerning injection with a different medication as well as possible surgery. I then discussed the eructation and at this point she is comfortable treating it symptomatically.  Discussed possible referral to gastroenterology but not at this point.

## 2019-02-08 DIAGNOSIS — Z1231 Encounter for screening mammogram for malignant neoplasm of breast: Secondary | ICD-10-CM | POA: Diagnosis not present

## 2019-02-08 DIAGNOSIS — M25562 Pain in left knee: Secondary | ICD-10-CM | POA: Insufficient documentation

## 2019-02-08 LAB — HM MAMMOGRAPHY

## 2019-02-13 DIAGNOSIS — M25562 Pain in left knee: Secondary | ICD-10-CM | POA: Diagnosis not present

## 2019-02-15 NOTE — Progress Notes (Addendum)
[facility] Note  02/17/2019     CHIEF COMPLAINT Patient presents for Retina Follow Up   HISTORY OF PRESENT ILLNESS: Kathy [name] is a 68 y.o. female who presents to the clinic today for:    HPI    Retina Follow Up    Patient presents with  Other.  In right eye.  Severity is moderate.  Duration of 4 months.  Since onset it is stable.  I, the attending physician,  performed the HPI with the patient and updated documentation appropriately.          Comments    Patient states still has flashes OD, still the same/no worse than before. Vision seems the same OU. Has FBS OS off and on for about 1 week.        Last edited by Bernarda Caffey, MD on 02/17/2019 10:47 AM. (History)    pt has an appt with Dr. Gevena Cotton next month  Referring physician: Denita Lung, MD [place],  [id number]  HISTORICAL INFORMATION:   Selected notes from the MEDICAL RECORD NUMBER Referred by Dr. Gevena Cotton for concern of central areolar choroidal dystrophy LEE: 05.16.19 (M. Frederico Hamman) [BCVA: OD: 20/30-2 OS: CF] Ocular Hx-Central areolar choroidal dystrophy, Macular Degeneration, exotropia OS, cataracts OU, dry eyes PMH-HTN, Fibromyalgia     CURRENT MEDICATIONS: Current Outpatient Medications (Ophthalmic Drugs)  Medication Sig  . cycloSPORINE (RESTASIS) 0.05 % ophthalmic emulsion Place 1 drop into both eyes 2 (two) times daily.   No current facility-administered medications for this visit.  (Ophthalmic Drugs)   Current Outpatient Medications (Other)  Medication Sig  . meloxicam (MOBIC) 7.5 MG tablet Take 1 tablet (7.5 mg total) by mouth daily.  . meloxicam (MOBIC) 7.5 MG tablet TAKE 1 TABLET EVERY DAY  . Multiple Vitamins-Minerals (PRESERVISION/LUTEIN) CAPS Take by mouth.  . Olmesartan-amLODIPine-HCTZ 40-10-12.5 MG TABS TAKE 1 TABLET BY MOUTH EVERY DAY  . Omega-3 Fatty Acids (FISH OIL) 1000 MG CAPS Take 1 capsule by mouth.  Marland Kitchen  albuterol (PROVENTIL HFA;VENTOLIN HFA) 108 (90 Base) MCG/ACT inhaler Inhale 2 puffs into the lungs every 6 (six) hours as needed for wheezing or shortness of breath. (Patient not taking: Reported on 02/17/2019)  . NONFORMULARY OR COMPOUNDED ITEM Apply 1-2 g topically daily. [employer] Nail lacquer: Fluconazole 2%, Terbinafine 1%, DMSO (Patient not taking: Reported on 08/24/2018)   Current Facility-Administered Medications (Other)  Medication Route  . 0.9 %  sodium chloride infusion Intravenous      REVIEW OF SYSTEMS: ROS    Positive for: Cardiovascular, Eyes, Respiratory   Negative for: Constitutional, Gastrointestinal, Neurological, Skin, Genitourinary, Musculoskeletal, HENT, Endocrine, Psychiatric, Allergic/Imm, Heme/Lymph   Last edited by Roselee Nova D on 02/17/2019  9:26 AM. (History)       ALLERGIES Allergies  Allergen Reactions  . Lortab [Hydrocodone-Acetaminophen] Nausea And Vomiting  . Latex     Pt unsure of reaction!    PAST MEDICAL HISTORY Past Medical History:  Diagnosis Date  . Exotropia of left eye   . Fibromyalgia   . History of uterine leiomyoma   . Hypertension   . Macular degeneration   . OA (osteoarthritis)   . Overactive bladder   . Sarcoidosis   . Wears glasses    Past Surgical History:  Procedure Laterality Date  . ABDOMINAL HYSTERECTOMY  2013  . ADJUSTABLE SUTURE MANIPULATION Left 04/10/2015   Procedure: ADJUSTABLE SUTURE MANIPULATION;  Surgeon: Gevena Cotton, MD;  Location: [facility];  Service:  Ophthalmology;  Laterality: Left;  . EYE SURGERY    . MEDIAN RECTUS REPAIR Left 04/10/2015   Procedure: MEDIAN RECTUS RESECTION LATERAL RECTUS RECESSION,AJUSTABLES SUTURES LEFT EYE;  Surgeon: Gevena Cotton, MD;  Location: [facility];  Service: Ophthalmology;  Laterality: Left;  . TONSILLECTOMY  as child  . TOTAL ABDOMINAL HYSTERECTOMY W/ BILATERAL SALPINGOOPHORECTOMY  10-14-2010   and TVT midurethral sling and culdoplasty     FAMILY HISTORY Family History  Problem Relation Age of Onset  . Colon cancer Mother   . Diabetes Father   . Diabetes Sister   . Hypertension Sister   . Diabetes Sister   . Hypertension Sister   . Hypertension Sister     SOCIAL HISTORY Social History   Tobacco Use  . Smoking status: Never Smoker  . Smokeless tobacco: Never Used  Substance Use Topics  . Alcohol use: Yes    Alcohol/week: 1.0 standard drinks    Types: 1 Glasses of wine per week    Comment: rare  . Drug use: No         OPHTHALMIC EXAM:  Base Eye Exam    Visual Acuity (Snellen - Linear)      Right Left   Dist cc 20/50 CF 2'   Dist ph cc NI NI   Correction:  Glasses       Tonometry (Tonopen, 9:35 AM)      Right Left   Pressure 16 15       Pupils      Dark Light Shape React APD   Right 3 2 Round Brisk None   Left 3 2.5 Round Minimal +2       Visual Fields (Counting fingers)      Left Right     Full   Restrictions Central scotoma        Extraocular Movement      Right Left    Full, Ortho Full, Ortho       Neuro/Psych    Oriented x3:  Yes   Mood/Affect:  Normal       Dilation    Both eyes:  1.0% Mydriacyl, 2.5% Phenylephrine @ 9:35 AM        Slit Lamp and Fundus Exam    Slit Lamp Exam      Right Left   Lids/Lashes Dermatochalasis - upper lid Dermatochalasis - upper lid   Conjunctiva/Sclera mild Melanosis mild Melanosis   Cornea Arcus, 1+ PEE Arcus, linear scar at 0430   Anterior Chamber Deep and quiet Deep and quiet   Iris Round and dilated Round and dilated   Lens 2-3+ Cortical cataract, 2-3+ Nuclear sclerosis, +vacuoles 2-3+ Nuclear sclerosis, 2-3+ Cortical cataract, Vacuoles   Vitreous Vitreous syneresis, Posterior vitreous detachment Vitreous syneresis       Fundus Exam      Right Left   Disc Pink and Sharp Tilted disc, almost 360 degrees of PPA   C/D Ratio 0.3 0.4   Macula large area of RPE and chorodial atrophy with small central foveal island spared; central  island w/pigment clumping around edges. large area of choroidal and retinal pigment epithelial atrophy extending to nasal side of disc, Pigment clumping, No heme    Vessels AV crossing changes, Vascular attenuation, Vascular attenuation   Periphery Attached,  vitreous condensations at 0300 with retinal break and +SRF / focal RD within bed of retinoschisis -- stable with good laser surrounding Attached           Refraction  Wearing Rx      Sphere Cylinder Axis Add   Right +0.25 +0.50 052 +2.50   Left +0.25 Sphere  +2.50   Type:  PAL          IMAGING AND PROCEDURES  Imaging and Procedures for @TODAY @  OCT, Retina - OU - Both Eyes       Right Eye Quality was good. Central Foveal Thickness: 219. Progression has been stable. Findings include abnormal foveal contour, inner retinal atrophy, outer retinal atrophy, no SRF, no IRF, retinal drusen  (?interval progression of ORA).   Left Eye Quality was borderline. Central Foveal Thickness: 213. Progression has been stable. Findings include outer retinal atrophy, inner retinal atrophy, abnormal foveal contour, no IRF, no SRF (Diffuse central atrophy).   Notes *Images captured and stored on drive  Diagnosis / Impression:  Stable from prior Diffuse atrophy OU OD with central island of non-atrophied retina -- ?interval progression of ORA Central areolar choroidal dystrophy OU Retinoschisis nasal periphery OD  Clinical management:  See below  Abbreviations: NFP - Normal foveal profile. CME - cystoid macular edema. PED - pigment epithelial detachment. IRF - intraretinal fluid. SRF - subretinal fluid. EZ - ellipsoid zone. ERM - epiretinal membrane. ORA - outer retinal atrophy. ORT - outer retinal tubulation. SRHM - subretinal hyper-reflective material                ASSESSMENT/PLAN:    ICD-10-CM   1. Right retinoschisis H33.101   2. Retinal detachment, right H33.21   3. 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  Choroidal atrophy of both eyes H31.103   5. Advanced atrophic nonexudative age-related macular degeneration of both eyes with subfoveal involvement H35.3134   6. Retinal edema H35.81 OCT, Retina - OU - Both Eyes  7. Combined form of age-related cataract, both eyes H25.813   8. Strabismus H50.9     1,2. Localized, focal retinal detachment / SRF surrounding small retinal tear at 0330, within retinoschisis cavity, RIGHT EYE - asymptomatic, but found on exam 5.23.19 - tear located at 0330 OD - S/P laser retinopexy OD (05.23.19), S/P touch up laser retinopexy OD (06.28.19) - good laser in place - no change in SRF/schisis-detachment - f/u in 4-6 months -- repeat widefield OCT over 0300 schisis cavity  3-5. Severe outer retinal and choroidal atrophy OU - stable today - likely central areolar choroidal dystrophy -- pt notes declining vision that started in her 78-50s - pt reports intermittent photopsias -- suspect may be related to progressive degeneration / dystrophy - differential also includes age related macular degeneration, non-exudative, both eyes  - The incidence, anatomy, and pathology of dry AMD, risk of progression, and the AREDS and AREDS 2 study including smoking risks discussed with patient.  - continue amsler grid monitoring  6. No retinal edema on exam or OCT  7. Combined form of age-related cataracts OU - The symptoms of cataract, surgical options, and treatments and risks were discussed with patient. - discussed diagnosis and progression - approaching visual significance -- discussed possibility that cataract surgery may increase brightness of vision, but would still be limited by retinal/choroidal dystrophy - will defer management to Dr. Frederico Hamman  8. Strabismus - under the expert management of Dr. Frederico Hamman - s/p EOM surgery 04/10/15 - monitor  Ophthalmic Meds Ordered this visit:  No orders of the defined types were placed in this encounter.      Return for ff/u 4-6  months atrophy OU, DFE, OCT.  There are no Patient  Instructions on file for this visit.   Explained the diagnoses, plan, and follow up with the patient and they expressed understanding.  Patient expressed understanding of the importance of proper follow up care.   This document serves as a record of services personally performed by Gardiner Sleeper, MD, PhD. It was created on their behalf by Ernest Mallick, OA, an ophthalmic assistant. The creation of this record is the provider's dictation and/or activities during the visit.    Electronically signed by: Ernest Mallick, OA  03.18.2020 12:21 PM     Gardiner Sleeper, M.D., Ph.D. Diseases & Surgery of the Retina and Vitreous [facility]  I have reviewed the above documentation for accuracy and completeness, and I agree with the above. Gardiner Sleeper, M.D., Ph.D. 02/17/19 12:33 PM     Abbreviations: M myopia (nearsighted); A astigmatism; H hyperopia (farsighted); P presbyopia; Mrx spectacle prescription;  CTL contact lenses; OD right eye; OS left eye; OU both eyes  XT exotropia; ET esotropia; PEK punctate epithelial keratitis; PEE punctate epithelial erosions; DES dry eye syndrome; MGD meibomian gland dysfunction; ATs artificial tears; PFAT's preservative free artificial tears; [place] nuclear sclerotic cataract; PSC posterior subcapsular cataract; ERM epi-retinal membrane; PVD posterior vitreous detachment; RD retinal detachment; DM diabetes mellitus; DR diabetic retinopathy; NPDR non-proliferative diabetic retinopathy; PDR proliferative diabetic retinopathy; CSME clinically significant macular edema; DME diabetic macular edema; dbh dot blot hemorrhages; CWS cotton wool spot; POAG primary open angle glaucoma; C/D cup-to-disc ratio; HVF humphrey visual field; GVF goldmann visual field; OCT optical coherence tomography; IOP intraocular pressure; BRVO Branch retinal vein occlusion; CRVO central retinal vein occlusion; CRAO central  retinal artery occlusion; BRAO branch retinal artery occlusion; RT retinal tear; SB scleral buckle; PPV pars plana vitrectomy; VH Vitreous hemorrhage; PRP panretinal laser photocoagulation; IVK intravitreal kenalog; VMT vitreomacular traction; MH Macular hole;  NVD neovascularization of the disc; NVE neovascularization elsewhere; AREDS age related eye disease study; ARMD age related macular degeneration; POAG primary open angle glaucoma; EBMD epithelial/anterior basement membrane dystrophy; ACIOL anterior chamber intraocular lens; IOL intraocular lens; PCIOL posterior chamber intraocular lens; Phaco/IOL phacoemulsification with intraocular lens placement; [place] photorefractive keratectomy; LASIK laser assisted in situ keratomileusis; HTN hypertension; DM diabetes mellitus; COPD chronic obstructive pulmonary disease

## 2019-02-17 ENCOUNTER — Other Ambulatory Visit: Payer: Self-pay

## 2019-02-17 ENCOUNTER — Ambulatory Visit (INDEPENDENT_AMBULATORY_CARE_PROVIDER_SITE_OTHER): Payer: Medicare Other | Admitting: Ophthalmology

## 2019-02-17 ENCOUNTER — Encounter (INDEPENDENT_AMBULATORY_CARE_PROVIDER_SITE_OTHER): Payer: Self-pay | Admitting: Ophthalmology

## 2019-02-17 DIAGNOSIS — H509 Unspecified strabismus: Secondary | ICD-10-CM

## 2019-02-17 DIAGNOSIS — H31103 Choroidal degeneration, unspecified, bilateral: Secondary | ICD-10-CM | POA: Diagnosis not present

## 2019-02-17 DIAGNOSIS — H353134 Nonexudative age-related macular degeneration, bilateral, advanced atrophic with subfoveal involvement: Secondary | ICD-10-CM

## 2019-02-17 DIAGNOSIS — H33101 Unspecified retinoschisis, right eye: Secondary | ICD-10-CM | POA: Diagnosis not present

## 2019-02-17 DIAGNOSIS — H25813 Combined forms of age-related cataract, bilateral: Secondary | ICD-10-CM

## 2019-02-17 DIAGNOSIS — H3122 Choroidal dystrophy (central areolar) (generalized) (peripapillary): Secondary | ICD-10-CM

## 2019-02-17 DIAGNOSIS — H3321 Serous retinal detachment, right eye: Secondary | ICD-10-CM | POA: Diagnosis not present

## 2019-02-17 DIAGNOSIS — H3581 Retinal edema: Secondary | ICD-10-CM | POA: Diagnosis not present

## 2019-03-01 DIAGNOSIS — H5440 Blindness, one eye, unspecified eye: Secondary | ICD-10-CM | POA: Diagnosis not present

## 2019-03-01 DIAGNOSIS — H3122 Choroidal dystrophy (central areolar) (generalized) (peripapillary): Secondary | ICD-10-CM | POA: Diagnosis not present

## 2019-03-01 DIAGNOSIS — H538 Other visual disturbances: Secondary | ICD-10-CM | POA: Diagnosis not present

## 2019-03-23 ENCOUNTER — Encounter: Payer: Self-pay | Admitting: Family Medicine

## 2019-05-01 HISTORY — PX: CATARACT EXTRACTION: SUR2

## 2019-05-01 HISTORY — PX: EYE SURGERY: SHX253

## 2019-05-02 ENCOUNTER — Other Ambulatory Visit: Payer: Self-pay | Admitting: Family Medicine

## 2019-05-02 DIAGNOSIS — N318 Other neuromuscular dysfunction of bladder: Secondary | ICD-10-CM

## 2019-05-02 NOTE — Telephone Encounter (Signed)
[facility] is requesting to fill pt mobic. Please advise [facility] 

## 2019-05-04 DIAGNOSIS — H25813 Combined forms of age-related cataract, bilateral: Secondary | ICD-10-CM | POA: Diagnosis not present

## 2019-05-04 DIAGNOSIS — H353134 Nonexudative age-related macular degeneration, bilateral, advanced atrophic with subfoveal involvement: Secondary | ICD-10-CM | POA: Diagnosis not present

## 2019-05-04 DIAGNOSIS — H524 Presbyopia: Secondary | ICD-10-CM | POA: Diagnosis not present

## 2019-05-10 DIAGNOSIS — H25811 Combined forms of age-related cataract, right eye: Secondary | ICD-10-CM | POA: Diagnosis not present

## 2019-05-10 DIAGNOSIS — H25012 Cortical age-related cataract, left eye: Secondary | ICD-10-CM | POA: Diagnosis not present

## 2019-05-10 DIAGNOSIS — H2512 Age-related nuclear cataract, left eye: Secondary | ICD-10-CM | POA: Diagnosis not present

## 2019-05-17 DIAGNOSIS — H25011 Cortical age-related cataract, right eye: Secondary | ICD-10-CM | POA: Diagnosis not present

## 2019-05-17 DIAGNOSIS — H5703 Miosis: Secondary | ICD-10-CM | POA: Diagnosis not present

## 2019-05-17 DIAGNOSIS — H2511 Age-related nuclear cataract, right eye: Secondary | ICD-10-CM | POA: Diagnosis not present

## 2019-05-24 NOTE — H&P (Signed)
TOTAL KNEE ADMISSION H&P  Patient is being admitted for left total knee arthroplasty.  Subjective:  Chief Complaint:   Left knee primary OA / pain  HPI: Kathy [name], 68 y.o. female, has a history of pain and functional disability in the left knee due to arthritis and has failed non-surgical conservative treatments for greater than 12 weeks to include NSAID's and/or analgesics, corticosteriod injections and activity modification.  Onset of symptoms was gradual, starting 1+ years ago with gradually worsening course since that time. The patient noted prior procedures on the knee to include  arthroplasty on the right knee by Dr. Alvan Dame at least 7 years ago.  Patient currently rates pain in the left knee(s) at 10 out of 10 with activity. Patient has night pain, worsening of pain with activity and weight bearing, pain that interferes with activities of daily living, pain with passive range of motion, crepitus and joint swelling.  Patient has evidence of periarticular osteophytes and joint space narrowing by imaging studies.  There is no active infection.  Risks, benefits and expectations were discussed with the patient.  Risks including but not limited to the risk of anesthesia, blood clots, nerve damage, blood vessel damage, failure of the prosthesis, infection and up to and including death.  Patient understand the risks, benefits and expectations and wishes to proceed with surgery.   PCP: Denita Lung, MD  D/C Plans:       Home   Post-op Meds:       No Rx given  Tranexamic Acid:      To be given - IV   Decadron:      Is to be given  FYI:      ASA  Tramadol  DME:    Rx given for - RW & 3-n-1  PT:   OPPT   [facility] [place]    Patient Active Problem List   Diagnosis Date Noted  . Arthritis 10/28/2016  . Allergic rhinitis due to pollen 02/03/2016  . Exotropia 04/08/2015  . Macular degeneration 04/27/2012  . Fibromyalgia 02/22/2012  . SARCOIDOSIS 07/09/2010   . Essential hypertension 07/09/2010   Past Medical History:  Diagnosis Date  . Exotropia of left eye   . Fibromyalgia   . History of uterine leiomyoma   . Hypertension   . Macular degeneration   . OA (osteoarthritis)   . Overactive bladder   . Sarcoidosis   . Wears glasses     Past Surgical History:  Procedure Laterality Date  . ABDOMINAL HYSTERECTOMY  2013  . ADJUSTABLE SUTURE MANIPULATION Left 04/10/2015   Procedure: ADJUSTABLE SUTURE MANIPULATION;  Surgeon: Gevena Cotton, MD;  Location: [facility];  Service: Ophthalmology;  Laterality: Left;  . EYE SURGERY    . MEDIAN RECTUS REPAIR Left 04/10/2015   Procedure: MEDIAN RECTUS RESECTION LATERAL RECTUS RECESSION,AJUSTABLES SUTURES LEFT EYE;  Surgeon: Gevena Cotton, MD;  Location[facility];  Service: Ophthalmology;  Laterality: Left;  . TONSILLECTOMY  as child  . TOTAL ABDOMINAL HYSTERECTOMY W/ BILATERAL SALPINGOOPHORECTOMY  10-14-2010   and TVT midurethral sling and culdoplasty    Current Facility-Administered Medications  Medication Dose Route Frequency Provider Last Rate Last Dose  . 0.9 %  sodium chloride infusion  500 mL Intravenous Continuous Irene Shipper, MD       Current Outpatient Medications  Medication Sig Dispense Refill Last Dose  . albuterol (PROVENTIL HFA;VENTOLIN HFA) 108 (90 Base) MCG/ACT inhaler Inhale 2 puffs into the lungs  every 6 (six) hours as needed for wheezing or shortness of breath. (Patient not taking: Reported on 02/17/2019) 1 Inhaler 0 Not Taking  . cycloSPORINE (RESTASIS) 0.05 % ophthalmic emulsion Place 1 drop into both eyes 2 (two) times daily.   Taking  . meloxicam (MOBIC) 7.5 MG tablet Take 1 tablet (7.5 mg total) by mouth daily. 90 tablet 3 Taking  . meloxicam (MOBIC) 7.5 MG tablet TAKE 1 TABLET EVERY DAY 90 tablet 0   . Multiple Vitamins-Minerals (PRESERVISION/LUTEIN) CAPS Take by mouth.   Taking  . NONFORMULARY OR COMPOUNDED ITEM Apply 1-2 g topically daily.  [employer] Nail lacquer: Fluconazole 2%, Terbinafine 1%, DMSO (Patient not taking: Reported on 08/24/2018) 120 each 3 Not Taking  . Olmesartan-amLODIPine-HCTZ 40-10-12.5 MG TABS TAKE 1 TABLET BY MOUTH EVERY DAY 90 tablet 3 Taking  . Omega-3 Fatty Acids (FISH OIL) 1000 MG CAPS Take 1 capsule by mouth.   Taking   Allergies  Allergen Reactions  . Lortab [Hydrocodone-Acetaminophen] Nausea And Vomiting  . Latex     Pt unsure of reaction!    Social History   Tobacco Use  . Smoking status: Never Smoker  . Smokeless tobacco: Never Used  Substance Use Topics  . Alcohol use: Yes    Alcohol/week: 1.0 standard drinks    Types: 1 Glasses of wine per week    Comment: rare    Family History  Problem Relation Age of Onset  . Colon cancer Mother   . Diabetes Father   . Diabetes Sister   . Hypertension Sister   . Diabetes Sister   . Hypertension Sister   . Hypertension Sister      Review of Systems  Constitutional: Negative.   HENT: Negative.   Eyes: Negative.   Respiratory: Negative.   Cardiovascular: Negative.   Gastrointestinal: Negative.   Genitourinary: Negative.   Musculoskeletal: Positive for joint pain.  Skin: Negative.   Neurological: Negative.   Endo/Heme/Allergies: Positive for environmental allergies.  Psychiatric/Behavioral: Negative.     Objective:  Physical Exam  Constitutional: She is oriented to person, place, and time. She appears well-developed.  HENT:  Head: Normocephalic.  Eyes: Pupils are equal, round, and reactive to light.  Neck: Neck supple. No JVD present. No tracheal deviation present. No thyromegaly present.  Cardiovascular: Normal rate, regular rhythm and intact distal pulses.  Respiratory: Effort normal and breath sounds normal. No respiratory distress. She has no wheezes.  GI: Soft. There is no abdominal tenderness. There is no guarding.  Musculoskeletal:     Left knee: She exhibits swelling and bony tenderness. She exhibits no ecchymosis, no  deformity, no laceration and no erythema. Tenderness found.  Lymphadenopathy:    She has no cervical adenopathy.  Neurological: She is alert and oriented to person, place, and time.  Skin: Skin is warm and dry.  Psychiatric: She has a normal mood and affect.      Labs:  Estimated body mass index is 32.2 kg/m as calculated from the following:   Height as of 08/24/18: 5\' 3"  (1.6 m).   Weight as of 02/02/19: 82.5 kg.   Imaging Review Plain radiographs demonstrate severe degenerative joint disease of the left knee.  The bone quality appears to be good for age and reported activity level.      Assessment/Plan:  End stage arthritis, left knee   The patient history, physical examination, clinical judgment of the provider and imaging studies are consistent with end stage degenerative joint disease of the left knee and total knee  arthroplasty is deemed medically necessary. The treatment options including medical management, injection therapy arthroscopy and arthroplasty were discussed at length. The risks and benefits of total knee arthroplasty were presented and reviewed. The risks due to aseptic loosening, infection, stiffness, patella tracking problems, thromboembolic complications and other imponderables were discussed. The patient acknowledged the explanation, agreed to proceed with the plan and consent was signed. Patient is being admitted for inpatient treatment for surgery, pain control, PT, OT, prophylactic antibiotics, VTE prophylaxis, progressive ambulation and ADL's and discharge planning. The patient is planning to be discharged home.     Patient's anticipated LOS is less than 2 midnights, meeting these requirements: - Younger than 38 - Lives within 1 hour of care - Has a competent adult at home to recover with post-op recover - NO history of  - Chronic pain requiring opiods  - Diabetes  - Coronary Artery Disease  - Heart failure  - Heart attack  - Stroke  - DVT/VTE  -  Cardiac arrhythmia  - Respiratory Failure/COPD  - Renal failure  - Anemia  - Advanced Liver disease          Kathy Pugh. Tishia Maestre   PA-C  05/24/2019, 3:26 PM

## 2019-05-25 ENCOUNTER — Ambulatory Visit (INDEPENDENT_AMBULATORY_CARE_PROVIDER_SITE_OTHER): Payer: Medicare Other | Admitting: Family Medicine

## 2019-05-25 ENCOUNTER — Encounter: Payer: Self-pay | Admitting: Family Medicine

## 2019-05-25 ENCOUNTER — Other Ambulatory Visit: Payer: Self-pay

## 2019-05-25 VITALS — BP 120/82 | HR 82 | Temp 98.4°F | Wt 185.6 lb

## 2019-05-25 DIAGNOSIS — I10 Essential (primary) hypertension: Secondary | ICD-10-CM | POA: Diagnosis not present

## 2019-05-25 DIAGNOSIS — Z9849 Cataract extraction status, unspecified eye: Secondary | ICD-10-CM | POA: Diagnosis not present

## 2019-05-25 DIAGNOSIS — Z01818 Encounter for other preprocedural examination: Secondary | ICD-10-CM | POA: Diagnosis not present

## 2019-05-25 DIAGNOSIS — D869 Sarcoidosis, unspecified: Secondary | ICD-10-CM

## 2019-05-25 NOTE — Progress Notes (Signed)
   Subjective:    Patient ID: Kathy [name], female    DOB: [birth year], 68 y.o.   MRN: [id number]  HPI She is here for her preoperative evaluation prior to knee replacement.  She recently had bilateral cataract surgery and did very nicely with that.  She does have an underlying history of sarcoidosis as well as hypertension.  She is doing nicely on those medications.  She has had no chest pain, shortness of breath, PND or DOE.  No family history of heart disease.   Review of Systems     Objective:   Physical Exam Alert and in no distress. Tympanic membranes and canals are normal. Pharyngeal area is normal. Neck is supple without adenopathy or thyromegaly. Cardiac exam shows a regular sinus rhythm without murmurs or gallops. Lungs are clear to auscultation. EKG shows no acute change       Assessment & Plan:   Encounter Diagnoses  Name Primary?  . Sarcoidosis   . Essential hypertension   . Pre-op evaluation Yes  . History of cataract extraction, unspecified laterality   cleared for surgery

## 2019-06-01 DIAGNOSIS — M1712 Unilateral primary osteoarthritis, left knee: Secondary | ICD-10-CM | POA: Diagnosis not present

## 2019-06-08 NOTE — Patient Instructions (Addendum)
YOU NEED TO HAVE A COVID 19 TEST ON 06-12-2019 AT 200 PM. THIS TEST MUST BE DONE BEFORE SURGERY, COME TO [facility] ENTRANCE. ONCE YOUR COVID TEST IS COMPLETED, PLEASE BEGIN THE QUARANTINE INSTRUCTIONS AS OUTLINED IN YOUR HANDOUT.                Kathy [name]     Your procedure is scheduled on: 06-15-2019  Report to [facility] Main  Entrance   Report to admitting at 610  AM      Call this number if you have problems the morning of surgery [phone]    Remember: [place], NO [place].   NO SOLID FOOD AFTER MIDNIGHT THE NIGHT PRIOR TO SURGERY. NOTHING BY MOUTH EXCEPT CLEAR LIQUIDS UNTIL 540 AM. PLEASE FINISH ENSURE DRINK PER SURGEON ORDER 3 HOURS PRIOR TO SCHEDULED SURGERY TIME WHICH NEEDS TO BE COMPLETED AT 540 AM.   CLEAR LIQUID DIET   Foods Allowed                                                                     Foods Excluded  Coffee and tea, regular and decaf                             liquids that you cannot  Plain Jell-O in any flavor                                             see through such as: Fruit ices (not with fruit pulp)                                     milk, soups, orange juice  Iced Popsicles                                    All solid food Carbonated beverages, regular and diet                                    Cranberry, grape and apple juices Sports drinks like Gatorade Lightly seasoned clear broth or consume(fat free) Sugar, honey syrup  Sample Menu Breakfast                                Lunch                                     Supper Cranberry juice                    Beef broth  Chicken broth Jell-O                                     Grape juice                           Apple juice Coffee or tea                        Jell-O                                      Popsicle                    Coffee or tea                        Coffee or tea  _____________________________________________________________________    Take these medicines the morning of surgery with A SIP OF WATER: eye drops as usual                                You may not have any metal on your body including hair pins and              piercings  Do not wear jewelry, make-up, lotions, powders or perfumes, deodorant             Do not wear nail polish.  Do not shave  48 hours prior to surgery.                 Do not bring valuables to the hospital. [place].  Contacts, dentures or bridgework may not be worn into surgery.  _____________________________________________________________________          [facility] - Preparing for Surgery Before surgery, you can play an important role.  Because skin is not sterile, your skin needs to be as free of germs as possible.  You can reduce the number of germs on your skin by washing with CHG (chlorahexidine gluconate) soap before surgery.  CHG is an antiseptic cleaner which kills germs and bonds with the skin to continue killing germs even after washing. Please DO NOT use if you have an allergy to CHG or antibacterial soaps.  If your skin becomes reddened/irritated stop using the CHG and inform your nurse when you arrive at Short Stay. Do not shave (including legs and underarms) for at least 48 hours prior to the first CHG shower.  You may shave your face/neck. Please follow these instructions carefully:  1.  Shower with CHG Soap the night before surgery and the  morning of Surgery.  2.  If you choose to wash your hair, wash your hair first as usual with your  normal  shampoo.  3.  After you shampoo, rinse your hair and body thoroughly to remove the  shampoo.                           4.  Use CHG as you would any other liquid soap.  You can apply chg directly  to the skin and wash                        Gently with a scrungie or clean washcloth.  5.  Apply the CHG Soap to your body ONLY FROM THE NECK DOWN.   Do not use on face/ open                           Wound or open sores. Avoid contact with eyes, ears mouth and genitals (private parts).                       Wash face,  Genitals (private parts) with your normal soap.             6.  Wash thoroughly, paying special attention to the area where your surgery  will be performed.  7.  Thoroughly rinse your body with warm water from the neck down.  8.  DO NOT shower/wash with your normal soap after using and rinsing off  the CHG Soap.                9.  Pat yourself dry with a clean towel.            10.  Wear clean pajamas.            11.  Place clean sheets on your bed the night of your first shower and do not  sleep with pets. Day of Surgery : Do not apply any lotions/deodorants the morning of surgery.  Please wear clean clothes to the hospital/surgery center.  FAILURE TO FOLLOW THESE INSTRUCTIONS MAY RESULT IN THE CANCELLATION OF YOUR SURGERY PATIENT SIGNATURE_________________________________  NURSE SIGNATURE__________________________________  ________________________________________________________________________   [name]  An incentive spirometer is a tool that can help keep your lungs clear and active. This tool measures how well you are filling your lungs with each breath. Taking long deep breaths may help reverse or decrease the chance of developing breathing (pulmonary) problems (especially infection) following:  A long period of time when you are unable to move or be active. BEFORE THE PROCEDURE   If the spirometer includes an indicator to show your best effort, your nurse or respiratory therapist will set it to a desired goal.  If possible, sit up straight or lean slightly forward. Try not to slouch.  Hold the incentive spirometer in an upright position. INSTRUCTIONS FOR USE  1. Sit on the edge of your bed  if possible, or sit up as far as you can in bed or on a chair. 2. Hold the incentive spirometer in an upright position. 3. Breathe out normally. 4. Place the mouthpiece in your mouth and seal your lips tightly around it. 5. Breathe in slowly and as deeply as possible, raising the piston or the ball toward the top of the column. 6. Hold your breath for 3-5 seconds or for as long as possible. Allow the piston or ball to fall to the bottom of the column. 7. Remove the mouthpiece from your mouth and breathe out normally. 8. Rest for a few seconds and repeat Steps 1 through 7 at least 10 times every 1-2 hours when you are awake. Take your time and take a few normal breaths between deep breaths. 9. The spirometer may include an indicator to show your best effort. Use the indicator as a goal to work toward during each repetition. 10. After  each set of 10 deep breaths, practice coughing to be sure your lungs are clear. If you have an incision (the cut made at the time of surgery), support your incision when coughing by placing a pillow or rolled up towels firmly against it. Once you are able to get out of bed, walk around indoors and cough well. You may stop using the incentive spirometer when instructed by your caregiver.  RISKS AND COMPLICATIONS  Take your time so you do not get dizzy or light-headed.  If you are in pain, you may need to take or ask for pain medication before doing incentive spirometry. It is harder to take a deep breath if you are having pain. AFTER USE  Rest and breathe slowly and easily.  It can be helpful to keep track of a log of your progress. Your caregiver can provide you with a simple table to help with this. If you are using the spirometer at home, follow these instructions: [place] IF:   You are having difficultly using the spirometer.  You have trouble using the spirometer as often as instructed.  Your pain medication is not giving enough relief while  using the spirometer.  You develop fever of 100.5 F (38.1 C) or higher. SEEK IMMEDIATE MEDICAL CARE IF:   You cough up bloody sputum that had not been present before.  You develop fever of 102 F (38.9 C) or greater.  You develop worsening pain at or near the incision site. MAKE SURE YOU:   Understand these instructions.  Will watch your condition.  Will get help right away if you are not doing well or get worse. Document Released: 03/29/2007 Document Revised: 02/08/2012 Document Reviewed: 05/30/2007 ExitCare Patient Information 2014 ExitCare, [place].   ________________________________________________________________________  WHAT IS A BLOOD TRANSFUSION? Blood Transfusion Information  A transfusion is the replacement of blood or some of its parts. Blood is made up of multiple cells which provide different functions.  Red blood cells carry oxygen and are used for blood loss replacement.  White blood cells fight against infection.  Platelets control bleeding.  Plasma helps clot blood.  Other blood products are available for specialized needs, such as hemophilia or other clotting disorders. BEFORE THE TRANSFUSION  Who gives blood for transfusions?   Healthy volunteers who are fully evaluated to make sure their blood is safe. This is blood bank blood. Transfusion therapy is the safest it has ever been in the practice of medicine. Before blood is taken from a donor, a complete history is taken to make sure that person has no history of diseases nor engages in risky social behavior (examples are intravenous drug use or sexual activity with multiple partners). The donor's travel history is screened to minimize risk of transmitting infections, such as malaria. The donated blood is tested for signs of infectious diseases, such as HIV and hepatitis. The blood is then tested to be sure it is compatible with you in order to minimize the chance of a transfusion reaction. If you or a  relative donates blood, this is often done in anticipation of surgery and is not appropriate for emergency situations. It takes many days to process the donated blood. RISKS AND COMPLICATIONS Although transfusion therapy is very safe and saves many lives, the main dangers of transfusion include:   Getting an infectious disease.  Developing a transfusion reaction. This is an allergic reaction to something in the blood you were given. Every precaution is taken to prevent this. The decision to  have a blood transfusion has been considered carefully by your caregiver before blood is given. Blood is not given unless the benefits outweigh the risks. AFTER THE TRANSFUSION  Right after receiving a blood transfusion, you will usually feel much better and more energetic. This is especially true if your red blood cells have gotten low (anemic). The transfusion raises the level of the red blood cells which carry oxygen, and this usually causes an energy increase.  The nurse administering the transfusion will monitor you carefully for complications. HOME CARE INSTRUCTIONS  No special instructions are needed after a transfusion. You may find your energy is better. Speak with your caregiver about any limitations on activity for underlying diseases you may have. SEEK MEDICAL CARE IF:   Your condition is not improving after your transfusion.  You develop redness or irritation at the intravenous (IV) site. SEEK IMMEDIATE MEDICAL CARE IF:  Any of the following symptoms occur over the next 12 hours:  Shaking chills.  You have a temperature by mouth above 102 F (38.9 C), not controlled by medicine.  Chest, back, or muscle pain.  People around you feel you are not acting correctly or are confused.  Shortness of breath or difficulty breathing.  Dizziness and fainting.  You get a rash or develop hives.  You have a decrease in urine output.  Your urine turns a dark color or changes to pink, red, or  brown. Any of the following symptoms occur over the next 10 days:  You have a temperature by mouth above 102 F (38.9 C), not controlled by medicine.  Shortness of breath.  Weakness after normal activity.  The white part of the eye turns yellow (jaundice).  You have a decrease in the amount of urine or are urinating less often.  Your urine turns a dark color or changes to pink, red, or brown. Document Released: 11/13/2000 Document Revised: 02/08/2012 Document Reviewed: 07/02/2008 [facility] Patient Information 2014 [place], [place].  _______________________________________________________________________

## 2019-06-12 ENCOUNTER — Encounter (HOSPITAL_COMMUNITY): Payer: Self-pay

## 2019-06-12 ENCOUNTER — Other Ambulatory Visit: Payer: Self-pay

## 2019-06-12 ENCOUNTER — Encounter (HOSPITAL_COMMUNITY)
Admission: RE | Admit: 2019-06-12 | Discharge: 2019-06-12 | Disposition: A | Discharge status: Home / Self Care | Payer: Medicare Other | Source: Ambulatory Visit | Attending: Orthopedic Surgery | Admitting: Orthopedic Surgery

## 2019-06-12 ENCOUNTER — Other Ambulatory Visit (HOSPITAL_COMMUNITY)
Admission: RE | Admit: 2019-06-12 | Discharge: 2019-06-12 | Disposition: A | Discharge status: Home / Self Care | Payer: Medicare Other | Source: Ambulatory Visit | Attending: Orthopedic Surgery | Admitting: Orthopedic Surgery

## 2019-06-12 DIAGNOSIS — M1712 Unilateral primary osteoarthritis, left knee: Secondary | ICD-10-CM | POA: Insufficient documentation

## 2019-06-12 DIAGNOSIS — Z01812 Encounter for preprocedural laboratory examination: Secondary | ICD-10-CM | POA: Insufficient documentation

## 2019-06-12 DIAGNOSIS — Z1159 Encounter for screening for other viral diseases: Secondary | ICD-10-CM | POA: Insufficient documentation

## 2019-06-12 DIAGNOSIS — Z791 Long term (current) use of non-steroidal anti-inflammatories (NSAID): Secondary | ICD-10-CM | POA: Diagnosis not present

## 2019-06-12 DIAGNOSIS — I10 Essential (primary) hypertension: Secondary | ICD-10-CM | POA: Diagnosis not present

## 2019-06-12 DIAGNOSIS — M25762 Osteophyte, left knee: Secondary | ICD-10-CM | POA: Diagnosis not present

## 2019-06-12 LAB — SARS CORONAVIRUS 2 (TAT 6-24 HRS): SARS Coronavirus 2: NEGATIVE

## 2019-06-12 NOTE — Progress Notes (Signed)
Levada Dy ,NP aware at prop of pt Non symptomatic Sarcadosis no need to see pt. Per NP

## 2019-06-12 NOTE — Progress Notes (Signed)
ekg 05-25-19 Medical clearance 05-25-19 Dr. Mayra Reel epic

## 2019-06-13 ENCOUNTER — Encounter (HOSPITAL_COMMUNITY)
Admission: RE | Admit: 2019-06-13 | Discharge: 2019-06-13 | Disposition: A | Discharge status: Home / Self Care | Payer: Medicare Other | Source: Ambulatory Visit | Attending: Orthopedic Surgery | Admitting: Orthopedic Surgery

## 2019-06-13 DIAGNOSIS — M25762 Osteophyte, left knee: Secondary | ICD-10-CM | POA: Diagnosis not present

## 2019-06-13 DIAGNOSIS — Z791 Long term (current) use of non-steroidal anti-inflammatories (NSAID): Secondary | ICD-10-CM | POA: Diagnosis not present

## 2019-06-13 DIAGNOSIS — Z1159 Encounter for screening for other viral diseases: Secondary | ICD-10-CM | POA: Diagnosis not present

## 2019-06-13 DIAGNOSIS — M1712 Unilateral primary osteoarthritis, left knee: Secondary | ICD-10-CM | POA: Diagnosis not present

## 2019-06-13 DIAGNOSIS — I10 Essential (primary) hypertension: Secondary | ICD-10-CM | POA: Diagnosis not present

## 2019-06-13 LAB — BASIC METABOLIC PANEL
Anion gap: 12 (ref 5–15)
BUN: 20 mg/dL (ref 8–23)
CO2: 27 mmol/L (ref 22–32)
Calcium: 9.3 mg/dL (ref 8.9–10.3)
Chloride: 100 mmol/L (ref 98–111)
Creatinine, Ser: 0.75 mg/dL (ref 0.44–1.00)
GFR calc Af Amer: >60 mL/min (ref >60)
GFR calc non Af Amer: >60 mL/min (ref >60)
Glucose, Bld: 95 mg/dL (ref 70–99)
Potassium: 3.6 mmol/L (ref 3.5–5.1)
Sodium: 139 mmol/L (ref 135–145)

## 2019-06-13 LAB — SURGICAL PCR SCREEN
MRSA, PCR: NEGATIVE
Staphylococcus aureus: NEGATIVE

## 2019-06-13 LAB — ABO/RH: ABO/RH(D): B POS

## 2019-06-13 LAB — CBC
HCT: 41 % (ref 36.0–46.0)
Hemoglobin: 13.6 g/dL (ref 12.0–15.0)
MCH: 30.4 pg (ref 26.0–34.0)
MCHC: 33.2 g/dL (ref 30.0–36.0)
MCV: 91.5 fL (ref 80.0–100.0)
Platelets: 302 10*3/uL (ref 150–400)
RBC: 4.48 MIL/uL (ref 3.87–5.11)
RDW: 11.6 % (ref 11.5–15.5)
WBC: 5.7 10*3/uL (ref 4.0–10.5)
nRBC: 0 % (ref 0.0–0.2)

## 2019-06-14 ENCOUNTER — Encounter (HOSPITAL_COMMUNITY): Payer: Self-pay | Admitting: Anesthesiology

## 2019-06-14 NOTE — Anesthesia Preprocedure Evaluation (Addendum)
Anesthesia Evaluation  Patient identified by MRN, date of birth, ID band Patient awake    Reviewed: Allergy & Precautions, NPO status , Patient's Chart, lab work & pertinent test results  Airway Mallampati: II  TM Distance: >3 FB Neck ROM: Full    Dental  (+) Teeth Intact, Dental Advisory Given   Pulmonary    breath sounds clear to auscultation       Cardiovascular hypertension,  Rhythm:Regular Rate:Normal     Neuro/Psych negative psych ROS   GI/Hepatic negative GI ROS, Neg liver ROS,   Endo/Other  negative endocrine ROS  Renal/GU negative Renal ROS     Musculoskeletal  (+) Arthritis , Fibromyalgia -  Abdominal Normal abdominal exam  (+)   Peds  Hematology negative hematology ROS (+)   Anesthesia Other Findings   Reproductive/Obstetrics                            Anesthesia Physical Anesthesia Plan  ASA: II  Anesthesia Plan: Spinal   Post-op Pain Management:  Regional for Post-op pain   Induction: Intravenous  PONV Risk Score and Plan: 3 and Ondansetron, Dexamethasone, Midazolam and Propofol infusion  Airway Management Planned: Simple Face Mask  Additional Equipment: None  Intra-op Plan:   Post-operative Plan:   Informed Consent: I have reviewed the patients History and Physical, chart, labs and discussed the procedure including the risks, benefits and alternatives for the proposed anesthesia with the patient or authorized representative who has indicated his/her understanding and acceptance.     Dental advisory given  Plan Discussed with: CRNA  Anesthesia Plan Comments:        Anesthesia Quick Evaluation

## 2019-06-15 ENCOUNTER — Encounter (HOSPITAL_COMMUNITY): Payer: Self-pay | Admitting: *Deleted

## 2019-06-15 ENCOUNTER — Observation Stay (HOSPITAL_COMMUNITY)
Admission: RE | Admit: 2019-06-15 | Discharge: 2019-06-16 | Disposition: A | Discharge status: Home / Self Care | Payer: Medicare Other | Attending: Orthopedic Surgery | Admitting: Orthopedic Surgery

## 2019-06-15 ENCOUNTER — Ambulatory Visit (HOSPITAL_COMMUNITY): Payer: Medicare Other | Admitting: Emergency Medicine

## 2019-06-15 ENCOUNTER — Encounter (HOSPITAL_COMMUNITY)
Admission: RE | Disposition: A | Discharge status: Home / Self Care | Payer: Self-pay | Source: Home / Self Care | Attending: Orthopedic Surgery

## 2019-06-15 ENCOUNTER — Ambulatory Visit (HOSPITAL_COMMUNITY): Payer: Medicare Other | Admitting: Certified Registered Nurse Anesthetist

## 2019-06-15 ENCOUNTER — Other Ambulatory Visit: Payer: Self-pay

## 2019-06-15 DIAGNOSIS — Z791 Long term (current) use of non-steroidal anti-inflammatories (NSAID): Secondary | ICD-10-CM | POA: Insufficient documentation

## 2019-06-15 DIAGNOSIS — Z1159 Encounter for screening for other viral diseases: Secondary | ICD-10-CM | POA: Insufficient documentation

## 2019-06-15 DIAGNOSIS — G8918 Other acute postprocedural pain: Secondary | ICD-10-CM | POA: Diagnosis not present

## 2019-06-15 DIAGNOSIS — I10 Essential (primary) hypertension: Secondary | ICD-10-CM | POA: Insufficient documentation

## 2019-06-15 DIAGNOSIS — M1712 Unilateral primary osteoarthritis, left knee: Principal | ICD-10-CM | POA: Insufficient documentation

## 2019-06-15 DIAGNOSIS — E669 Obesity, unspecified: Secondary | ICD-10-CM | POA: Diagnosis present

## 2019-06-15 DIAGNOSIS — M25762 Osteophyte, left knee: Secondary | ICD-10-CM | POA: Diagnosis not present

## 2019-06-15 DIAGNOSIS — Z96652 Presence of left artificial knee joint: Secondary | ICD-10-CM

## 2019-06-15 DIAGNOSIS — M797 Fibromyalgia: Secondary | ICD-10-CM | POA: Diagnosis not present

## 2019-06-15 HISTORY — PX: TOTAL KNEE ARTHROPLASTY: SHX125

## 2019-06-15 LAB — TYPE AND SCREEN
ABO/RH(D): B POS
Antibody Screen: NEGATIVE

## 2019-06-15 SURGERY — ARTHROPLASTY, KNEE, TOTAL
Anesthesia: Spinal | Site: Knee | Laterality: Left

## 2019-06-15 MED ORDER — SODIUM CHLORIDE (PF) 0.9 % IJ SOLN
INTRAMUSCULAR | Status: DC | PRN
  Administered 2019-06-15: 30 mL

## 2019-06-15 MED ORDER — OLMESARTAN-AMLODIPINE-HCTZ 40-10-12.5 MG PO TABS
1.0000 | ORAL_TABLET | Freq: Every day | ORAL | Status: DC
Start: 2019-06-15 — End: 2019-06-15

## 2019-06-15 MED ORDER — METOCLOPRAMIDE HCL 5 MG/ML IJ SOLN
5.0000 mg | Freq: Three times a day (TID) | INTRAMUSCULAR | Status: DC | PRN
Start: 2019-06-15 — End: 2019-06-16

## 2019-06-15 MED ORDER — FENTANYL CITRATE (PF) 100 MCG/2ML IJ SOLN
50.0000 ug | INTRAMUSCULAR | Status: DC
  Administered 2019-06-15: 08:00:00 50 ug via INTRAVENOUS
  Filled 2019-06-15: qty 2

## 2019-06-15 MED ORDER — LIDOCAINE 2% (20 MG/ML) 5 ML SYRINGE
INTRAMUSCULAR | Status: DC | PRN
  Administered 2019-06-15: 60 mg via INTRAVENOUS

## 2019-06-15 MED ORDER — AMLODIPINE BESYLATE 10 MG PO TABS
10.0000 mg | ORAL_TABLET | Freq: Every day | ORAL | Status: DC
  Administered 2019-06-16: 10 mg via ORAL
  Filled 2019-06-15: qty 1

## 2019-06-15 MED ORDER — POVIDONE-IODINE 10 % EX SWAB
2.0000 "application " | Freq: Once | CUTANEOUS | Status: AC
  Administered 2019-06-15: 2 via TOPICAL

## 2019-06-15 MED ORDER — CHLORHEXIDINE GLUCONATE 4 % EX LIQD: 60.0000 mL | Freq: Once | CUTANEOUS | Status: DC

## 2019-06-15 MED ORDER — LACTATED RINGERS IV SOLN
INTRAVENOUS | Status: DC
  Administered 2019-06-15 (×2): via INTRAVENOUS

## 2019-06-15 MED ORDER — DEXAMETHASONE SODIUM PHOSPHATE 10 MG/ML IJ SOLN
INTRAMUSCULAR | Status: DC | PRN
  Administered 2019-06-15: 10 mg via INTRAVENOUS

## 2019-06-15 MED ORDER — ONDANSETRON HCL 4 MG/2ML IJ SOLN
INTRAMUSCULAR | Status: AC
  Filled 2019-06-15: qty 2

## 2019-06-15 MED ORDER — TRAMADOL HCL 50 MG PO TABS
50.0000 mg | ORAL_TABLET | Freq: Four times a day (QID) | ORAL | Status: DC | PRN
  Administered 2019-06-15 (×3): 50 mg via ORAL
  Administered 2019-06-16: 100 mg via ORAL
  Filled 2019-06-15: qty 2
  Filled 2019-06-15: qty 1
  Filled 2019-06-15: qty 2

## 2019-06-15 MED ORDER — CYCLOSPORINE 0.05 % OP EMUL
1.0000 [drp] | Freq: Two times a day (BID) | OPHTHALMIC | Status: DC
  Administered 2019-06-15 – 2019-06-16 (×2): 1 [drp] via OPHTHALMIC
  Filled 2019-06-15 (×2): qty 30

## 2019-06-15 MED ORDER — POLYETHYLENE GLYCOL 3350 17 G PO PACK
17.0000 g | PACK | Freq: Two times a day (BID) | ORAL | Status: DC
  Administered 2019-06-15 – 2019-06-16 (×2): 17 g via ORAL
  Filled 2019-06-15: qty 1

## 2019-06-15 MED ORDER — DEXAMETHASONE SODIUM PHOSPHATE 10 MG/ML IJ SOLN
10.0000 mg | Freq: Once | INTRAMUSCULAR | Status: AC
  Administered 2019-06-16: 10 mg via INTRAVENOUS
  Filled 2019-06-15: qty 1

## 2019-06-15 MED ORDER — KETOROLAC TROMETHAMINE 30 MG/ML IJ SOLN
INTRAMUSCULAR | Status: AC
  Filled 2019-06-15: qty 1

## 2019-06-15 MED ORDER — MIDAZOLAM HCL 2 MG/2ML IJ SOLN
1.0000 mg | INTRAMUSCULAR | Status: DC
  Administered 2019-06-15: 1 mg via INTRAVENOUS
  Filled 2019-06-15: qty 2

## 2019-06-15 MED ORDER — PHENOL 1.4 % MT LIQD: 1.0000 | OROMUCOSAL | Status: DC | PRN

## 2019-06-15 MED ORDER — ONDANSETRON HCL 4 MG/2ML IJ SOLN
INTRAMUSCULAR | Status: DC | PRN
  Administered 2019-06-15: 4 mg via INTRAVENOUS

## 2019-06-15 MED ORDER — ONDANSETRON HCL 4 MG PO TABS: 4.0000 mg | ORAL_TABLET | Freq: Four times a day (QID) | ORAL | Status: DC | PRN

## 2019-06-15 MED ORDER — BUPIVACAINE-EPINEPHRINE (PF) 0.25% -1:200000 IJ SOLN
INTRAMUSCULAR | Status: DC | PRN
  Administered 2019-06-15: 30 mL

## 2019-06-15 MED ORDER — DEXAMETHASONE SODIUM PHOSPHATE 10 MG/ML IJ SOLN: 10.0000 mg | Freq: Once | INTRAMUSCULAR | Status: DC

## 2019-06-15 MED ORDER — DIPHENHYDRAMINE HCL 12.5 MG/5ML PO ELIX: 12.5000 mg | ORAL_SOLUTION | ORAL | Status: DC | PRN

## 2019-06-15 MED ORDER — PROPOFOL 10 MG/ML IV BOLUS
INTRAVENOUS | Status: AC
  Filled 2019-06-15: qty 20

## 2019-06-15 MED ORDER — MENTHOL 3 MG MT LOZG: 1.0000 | LOZENGE | OROMUCOSAL | Status: DC | PRN

## 2019-06-15 MED ORDER — CEFAZOLIN SODIUM-DEXTROSE 2-4 GM/100ML-% IV SOLN
2.0000 g | Freq: Four times a day (QID) | INTRAVENOUS | Status: AC
  Administered 2019-06-15 (×2): 2 g via INTRAVENOUS
  Filled 2019-06-15 (×2): qty 100

## 2019-06-15 MED ORDER — SODIUM CHLORIDE 0.9 % IR SOLN
Status: DC | PRN
  Administered 2019-06-15: 3000 mL

## 2019-06-15 MED ORDER — FERROUS SULFATE 325 (65 FE) MG PO TABS
325.0000 mg | ORAL_TABLET | Freq: Two times a day (BID) | ORAL | Status: DC
  Administered 2019-06-15 – 2019-06-16 (×2): 325 mg via ORAL
  Filled 2019-06-15 (×2): qty 1

## 2019-06-15 MED ORDER — PROPOFOL 10 MG/ML IV BOLUS
INTRAVENOUS | Status: AC
  Filled 2019-06-15: qty 60

## 2019-06-15 MED ORDER — ROPIVACAINE HCL 7.5 MG/ML IJ SOLN
INTRAMUSCULAR | Status: DC | PRN
  Administered 2019-06-15: 20 mL via PERINEURAL

## 2019-06-15 MED ORDER — CEFAZOLIN SODIUM-DEXTROSE 2-4 GM/100ML-% IV SOLN
2.0000 g | INTRAVENOUS | Status: AC
  Administered 2019-06-15: 09:00:00 2 g via INTRAVENOUS
  Filled 2019-06-15: qty 100

## 2019-06-15 MED ORDER — METHOCARBAMOL 500 MG IVPB - SIMPLE MED
500.0000 mg | Freq: Four times a day (QID) | INTRAVENOUS | Status: DC | PRN
  Administered 2019-06-15: 500 mg via INTRAVENOUS
  Filled 2019-06-15: qty 50

## 2019-06-15 MED ORDER — HYDROCHLOROTHIAZIDE 12.5 MG PO CAPS
12.5000 mg | ORAL_CAPSULE | Freq: Every day | ORAL | Status: DC
  Administered 2019-06-16: 12.5 mg via ORAL
  Filled 2019-06-15: qty 1

## 2019-06-15 MED ORDER — ASPIRIN 81 MG PO CHEW
81.0000 mg | CHEWABLE_TABLET | Freq: Two times a day (BID) | ORAL | Status: DC
  Administered 2019-06-15 – 2019-06-16 (×2): 81 mg via ORAL
  Filled 2019-06-15 (×2): qty 1

## 2019-06-15 MED ORDER — EPHEDRINE SULFATE-NACL 50-0.9 MG/10ML-% IV SOSY
PREFILLED_SYRINGE | INTRAVENOUS | Status: DC | PRN
  Administered 2019-06-15 (×2): 10 mg via INTRAVENOUS

## 2019-06-15 MED ORDER — BUPIVACAINE IN DEXTROSE 0.75-8.25 % IT SOLN
INTRATHECAL | Status: DC | PRN
  Administered 2019-06-15: 1.6 mL via INTRATHECAL

## 2019-06-15 MED ORDER — SODIUM CHLORIDE 0.9 % IV SOLN
INTRAVENOUS | Status: DC
  Administered 2019-06-15 – 2019-06-16 (×2): via INTRAVENOUS

## 2019-06-15 MED ORDER — STERILE WATER FOR IRRIGATION IR SOLN
Status: DC | PRN
  Administered 2019-06-15: 2000 mL

## 2019-06-15 MED ORDER — ONDANSETRON HCL 4 MG/2ML IJ SOLN: 4.0000 mg | Freq: Four times a day (QID) | INTRAMUSCULAR | Status: DC | PRN

## 2019-06-15 MED ORDER — PHENYLEPHRINE 40 MCG/ML (10ML) SYRINGE FOR IV PUSH (FOR BLOOD PRESSURE SUPPORT)
PREFILLED_SYRINGE | INTRAVENOUS | Status: DC | PRN
  Administered 2019-06-15: 80 ug via INTRAVENOUS

## 2019-06-15 MED ORDER — MAGNESIUM CITRATE PO SOLN: 1.0000 | Freq: Once | ORAL | Status: DC | PRN

## 2019-06-15 MED ORDER — EPHEDRINE 5 MG/ML INJ
INTRAVENOUS | Status: AC
  Filled 2019-06-15: qty 10

## 2019-06-15 MED ORDER — MORPHINE SULFATE (PF) 2 MG/ML IV SOLN
0.5000 mg | INTRAVENOUS | Status: DC | PRN
  Administered 2019-06-15: 1 mg via INTRAVENOUS
  Administered 2019-06-15: 20:00:00 0.5 mg via INTRAVENOUS
  Filled 2019-06-15 (×2): qty 1

## 2019-06-15 MED ORDER — VANCOMYCIN HCL 1000 MG IV SOLR
INTRAVENOUS | Status: AC
  Filled 2019-06-15: qty 1000

## 2019-06-15 MED ORDER — 0.9 % SODIUM CHLORIDE (POUR BTL) OPTIME
TOPICAL | Status: DC | PRN
  Administered 2019-06-15: 1000 mL

## 2019-06-15 MED ORDER — METHOCARBAMOL 500 MG PO TABS
500.0000 mg | ORAL_TABLET | Freq: Four times a day (QID) | ORAL | Status: DC | PRN
  Administered 2019-06-15 – 2019-06-16 (×3): 500 mg via ORAL
  Filled 2019-06-15 (×3): qty 1

## 2019-06-15 MED ORDER — PHENYLEPHRINE 40 MCG/ML (10ML) SYRINGE FOR IV PUSH (FOR BLOOD PRESSURE SUPPORT)
PREFILLED_SYRINGE | INTRAVENOUS | Status: AC
  Filled 2019-06-15: qty 10

## 2019-06-15 MED ORDER — KETOROLAC TROMETHAMINE 30 MG/ML IJ SOLN
INTRAMUSCULAR | Status: DC | PRN
  Administered 2019-06-15: 30 mg via INTRA_ARTICULAR

## 2019-06-15 MED ORDER — BUPIVACAINE-EPINEPHRINE (PF) 0.25% -1:200000 IJ SOLN
INTRAMUSCULAR | Status: AC
  Filled 2019-06-15: qty 30

## 2019-06-15 MED ORDER — DEXAMETHASONE SODIUM PHOSPHATE 10 MG/ML IJ SOLN
INTRAMUSCULAR | Status: AC
  Filled 2019-06-15: qty 1

## 2019-06-15 MED ORDER — TRANEXAMIC ACID-NACL 1000-0.7 MG/100ML-% IV SOLN
1000.0000 mg | INTRAVENOUS | Status: AC
  Administered 2019-06-15: 09:00:00 1000 mg via INTRAVENOUS
  Filled 2019-06-15: qty 100

## 2019-06-15 MED ORDER — CELECOXIB 200 MG PO CAPS
200.0000 mg | ORAL_CAPSULE | Freq: Two times a day (BID) | ORAL | Status: DC
  Administered 2019-06-15 – 2019-06-16 (×3): 200 mg via ORAL
  Filled 2019-06-15 (×3): qty 1

## 2019-06-15 MED ORDER — DOCUSATE SODIUM 100 MG PO CAPS
100.0000 mg | ORAL_CAPSULE | Freq: Two times a day (BID) | ORAL | Status: DC
  Administered 2019-06-15 – 2019-06-16 (×2): 100 mg via ORAL
  Filled 2019-06-15 (×2): qty 1

## 2019-06-15 MED ORDER — ALUM & MAG HYDROXIDE-SIMETH 200-200-20 MG/5ML PO SUSP: 15.0000 mL | ORAL | Status: DC | PRN

## 2019-06-15 MED ORDER — SODIUM CHLORIDE (PF) 0.9 % IJ SOLN
INTRAMUSCULAR | Status: AC
  Filled 2019-06-15: qty 50

## 2019-06-15 MED ORDER — ACETAMINOPHEN 500 MG PO TABS
1000.0000 mg | ORAL_TABLET | Freq: Four times a day (QID) | ORAL | Status: AC
  Administered 2019-06-15 – 2019-06-16 (×4): 1000 mg via ORAL
  Filled 2019-06-15 (×4): qty 2

## 2019-06-15 MED ORDER — METHOCARBAMOL 500 MG IVPB - SIMPLE MED
INTRAVENOUS | Status: AC
  Filled 2019-06-15: qty 50

## 2019-06-15 MED ORDER — IRBESARTAN 150 MG PO TABS
300.0000 mg | ORAL_TABLET | Freq: Every day | ORAL | Status: DC
  Administered 2019-06-16: 300 mg via ORAL
  Filled 2019-06-15: qty 2

## 2019-06-15 MED ORDER — BISACODYL 10 MG RE SUPP: 10.0000 mg | Freq: Every day | RECTAL | Status: DC | PRN

## 2019-06-15 MED ORDER — METOCLOPRAMIDE HCL 5 MG PO TABS: 5.0000 mg | ORAL_TABLET | Freq: Three times a day (TID) | ORAL | Status: DC | PRN

## 2019-06-15 MED ORDER — TRANEXAMIC ACID-NACL 1000-0.7 MG/100ML-% IV SOLN
1000.0000 mg | Freq: Once | INTRAVENOUS | Status: AC
  Administered 2019-06-15: 1000 mg via INTRAVENOUS
  Filled 2019-06-15: qty 100

## 2019-06-15 MED ORDER — PROPOFOL 500 MG/50ML IV EMUL
INTRAVENOUS | Status: DC | PRN
  Administered 2019-06-15: 125 ug/kg/min via INTRAVENOUS

## 2019-06-15 SURGICAL SUPPLY — 64 items
ATTUNE MED ANAT PAT 35 KNEE (Knees) ×2 IMPLANT
ATTUNE MED ANAT PAT 35MM KNEE (Knees) ×1 IMPLANT
ATTUNE PSFEM LTSZ5 NARCEM KNEE (Femur) ×3 IMPLANT
ATTUNE PSRP INSR SZ5 8 KNEE (Insert) ×2 IMPLANT
ATTUNE PSRP INSR SZ5 8MM KNEE (Insert) ×1 IMPLANT
BAG ZIPLOCK 12X15 (MISCELLANEOUS) IMPLANT
BANDAGE ACE 6X5 VEL STRL LF (GAUZE/BANDAGES/DRESSINGS) ×3 IMPLANT
BASEPLATE TIBIAL ROTATING SZ 4 (Knees) ×3 IMPLANT
BLADE SAW SGTL 11.0X1.19X90.0M (BLADE) IMPLANT
BLADE SAW SGTL 13.0X1.19X90.0M (BLADE) ×3 IMPLANT
BLADE SURG SZ10 CARB STEEL (BLADE) ×6 IMPLANT
BOWL SMART MIX CTS (DISPOSABLE) ×3 IMPLANT
CATH FOLEY LATEX FREE 16FR (CATHETERS) ×3 IMPLANT
CEMENT HV SMART SET (Cement) ×6 IMPLANT
COVER SURGICAL LIGHT HANDLE (MISCELLANEOUS) ×3 IMPLANT
COVER WAND RF STERILE (DRAPES) IMPLANT
CUFF TOURN SGL QUICK 34 (TOURNIQUET CUFF) ×2
CUFF TRNQT CYL 34X4.125X (TOURNIQUET CUFF) ×1 IMPLANT
DECANTER SPIKE VIAL GLASS SM (MISCELLANEOUS) ×6 IMPLANT
DERMABOND ADVANCED (GAUZE/BANDAGES/DRESSINGS) ×2
DERMABOND ADVANCED .7 DNX12 (GAUZE/BANDAGES/DRESSINGS) ×1 IMPLANT
DRAPE U-SHAPE 47X51 STRL (DRAPES) ×3 IMPLANT
DRESSING AQUACEL AG SP 3.5X10 (GAUZE/BANDAGES/DRESSINGS) ×1 IMPLANT
DRSG AQUACEL AG ADV 3.5X10 (GAUZE/BANDAGES/DRESSINGS) ×3 IMPLANT
DRSG AQUACEL AG SP 3.5X10 (GAUZE/BANDAGES/DRESSINGS) ×3
DRSG PAD ABDOMINAL 8X10 ST (GAUZE/BANDAGES/DRESSINGS) ×3 IMPLANT
DURAPREP 26ML APPLICATOR (WOUND CARE) ×6 IMPLANT
ELECT REM PT RETURN 15FT ADLT (MISCELLANEOUS) ×3 IMPLANT
GAUZE SPONGE 4X4 12PLY STRL (GAUZE/BANDAGES/DRESSINGS) ×3 IMPLANT
GLOVE BIO SURGEON STRL SZ 6 (GLOVE) ×3 IMPLANT
GLOVE BIOGEL PI IND STRL 6.5 (GLOVE) ×1 IMPLANT
GLOVE BIOGEL PI IND STRL 7.5 (GLOVE) ×1 IMPLANT
GLOVE BIOGEL PI IND STRL 8.5 (GLOVE) ×1 IMPLANT
GLOVE BIOGEL PI INDICATOR 6.5 (GLOVE) ×2
GLOVE BIOGEL PI INDICATOR 7.5 (GLOVE) ×2
GLOVE BIOGEL PI INDICATOR 8.5 (GLOVE) ×2
GLOVE ECLIPSE 8.0 STRL XLNG CF (GLOVE) ×3 IMPLANT
GLOVE ORTHO TXT STRL SZ7.5 (GLOVE) ×3 IMPLANT
GOWN STRL REUS W/ TWL LRG LVL3 (GOWN DISPOSABLE) ×1 IMPLANT
GOWN STRL REUS W/TWL 2XL LVL3 (GOWN DISPOSABLE) ×3 IMPLANT
GOWN STRL REUS W/TWL LRG LVL3 (GOWN DISPOSABLE) ×5 IMPLANT
HANDPIECE INTERPULSE COAX TIP (DISPOSABLE) ×2
HOLDER FOLEY CATH W/STRAP (MISCELLANEOUS) ×3 IMPLANT
KIT TURNOVER KIT A (KITS) IMPLANT
MANIFOLD NEPTUNE II (INSTRUMENTS) ×3 IMPLANT
NDL SAFETY ECLIPSE 18X1.5 (NEEDLE) ×1 IMPLANT
NEEDLE HYPO 18GX1.5 SHARP (NEEDLE) ×2
NS IRRIG 1000ML POUR BTL (IV SOLUTION) ×3 IMPLANT
PACK TOTAL KNEE CUSTOM (KITS) ×3 IMPLANT
PADDING CAST COTTON 6X4 STRL (CAST SUPPLIES) ×3 IMPLANT
PIN THREADED HEADED SIGMA (PIN) ×3 IMPLANT
PROTECTOR NERVE ULNAR (MISCELLANEOUS) ×3 IMPLANT
SET HNDPC FAN SPRY TIP SCT (DISPOSABLE) ×1 IMPLANT
SET PAD KNEE POSITIONER (MISCELLANEOUS) ×3 IMPLANT
SUT MNCRL AB 4-0 PS2 18 (SUTURE) ×3 IMPLANT
SUT STRATAFIX PDS+ 0 24IN (SUTURE) ×3 IMPLANT
SUT VIC AB 1 CT1 36 (SUTURE) ×3 IMPLANT
SUT VIC AB 2-0 CT1 27 (SUTURE) ×6
SUT VIC AB 2-0 CT1 TAPERPNT 27 (SUTURE) ×3 IMPLANT
SYR 3ML LL SCALE MARK (SYRINGE) ×3 IMPLANT
TRAY FOLEY MTR SLVR 16FR STAT (SET/KITS/TRAYS/PACK) IMPLANT
WATER STERILE IRR 1000ML POUR (IV SOLUTION) ×6 IMPLANT
WRAP KNEE MAXI GEL POST OP (GAUZE/BANDAGES/DRESSINGS) ×3 IMPLANT
YANKAUER SUCT BULB TIP 10FT TU (MISCELLANEOUS) ×3 IMPLANT

## 2019-06-15 NOTE — Evaluation (Signed)
Physical Therapy Evaluation Patient Details Name: Kathy [name] MRN: [id number] DOB: Jan 24, 1951 Today's Date: 06/15/2019   History of Present Illness  Pt is a 68 y/o female s/p L TKR on 06/15/19. PMH includes HTN, macular degeneration, fibromyalgia, sarcoidosis.  Clinical Impression  Pt presents with L knee pain, decreased L knee ROM, difficulty performing bed mobility, increased time and effort to perform mobility tasks, and hypotension and bradycardia post-ambulation this session. Pt to benefit from acute PT to address deficits. Pt ambulated hallway distance with RW with min guard assist, verbal cuing for form and safety provided throughout. Pt educated on ankle pumps (20/hour) to perform this afternoon/evening to increase circulation, to pt's tolerance and limited by pain. PT to progress mobility as tolerated, and will continue to follow acutely.        Follow Up Recommendations Supervision for mobility/OOB;Follow surgeon's recommendation for DC plan and follow-up therapies(OPPT)    Equipment Recommendations  None recommended by PT    Recommendations for Other Services       Precautions / Restrictions Precautions Precautions: Fall Restrictions Weight Bearing Restrictions: No Other Position/Activity Restrictions: WBAT      Mobility  Bed Mobility Overal bed mobility: Needs Assistance Bed Mobility: Supine to Sit     Supine to sit: Min guard;HOB elevated     General bed mobility comments: Min assist for LLE lifting and translation to EOB. Verbal cuing for sequencing, increased time and effort.  Transfers Overall transfer level: Needs assistance Equipment used: Rolling walker (2 wheeled) Transfers: Sit to/from Stand Sit to Stand: Min assist;From elevated surface         General transfer comment: Min assist for steadying upon standing, verbal cuing for hand placement when rising.  Ambulation/Gait Ambulation/Gait assistance: Min guard Gait Distance (Feet): 65  Feet Assistive device: Rolling walker (2 wheeled) Gait Pattern/deviations: Step-to pattern;Decreased stride length;Trunk flexed Gait velocity: decr   General Gait Details: Min guard for safety. Verbal cuing for sequencing, placement in RW, upright posture. Upon arrival back to room, pt stated "I feel bad". PT had PT aide bring recliner right behind pt, and pt immediately sat and reclined in recliner with LEs elevated. BP and HR 93/68 and 47 bpm. 2 minutes later 99/65 and 54 BPM. Cool cloths applied to forehead and chest.  Stairs            Wheelchair Mobility    Modified Rankin (Stroke Patients Only)       Balance Overall balance assessment: Mild deficits observed, not formally tested                                           Pertinent Vitals/Pain Pain Assessment: 0-10 Pain Score: 3  Pain Location: L knee Pain Descriptors / Indicators: Sore Pain Intervention(s): Limited activity within patient's tolerance;Monitored during session;Premedicated before session;Repositioned;Ice applied    Home Living Family/patient expects to be discharged to:: Private residence Living Arrangements: Spouse/significant other Available Help at Discharge: Family Type of Home: House Home Access: Stairs to enter Entrance Stairs-Rails: None Entrance Stairs-Number of Steps: 3 Home Layout: One level Home Equipment: Environmental consultant - 2 wheels;Walker - 4 wheels;Bedside commode      Prior Function Level of Independence: Independent               Hand Dominance   Dominant Hand: Right    Extremity/Trunk Assessment   Upper Extremity Assessment Upper Extremity Assessment:  Overall [facility] for tasks assessed    Lower Extremity Assessment Lower Extremity Assessment: Overall WFL for tasks assessed;LLE deficits/detail LLE Deficits / Details: suspected post-surgical weakness; able to perform ankle pumps, quad set, heel slide to 60*, SLR without lift assist or quad lag LLE Sensation:  WNL    Cervical / Trunk Assessment Cervical / Trunk Assessment: Normal  Communication   Communication: No difficulties  Cognition Arousal/Alertness: Awake/alert Behavior During Therapy: WFL for tasks assessed/performed Overall Cognitive Status: Within Functional Limits for tasks assessed                                        General Comments      Exercises     Assessment/Plan    PT Assessment Patient needs continued PT services  PT Problem List Decreased strength;Decreased mobility;Decreased range of motion;Decreased activity tolerance;Decreased balance;Decreased knowledge of use of DME;Pain       PT Treatment Interventions DME instruction;Therapeutic activities;Gait training;Therapeutic exercise;Patient/family education;Balance training;Stair training;Functional mobility training    PT Goals (Current goals can be found in the Care Plan section)  Acute Rehab PT Goals Patient Stated Goal: go home PT Goal Formulation: With patient Time For Goal Achievement: 06/22/19 Potential to Achieve Goals: Good    Frequency 7X/week   Barriers to discharge        Co-evaluation               AM-PAC PT "6 Clicks" Mobility  Outcome Measure Help needed turning from your back to your side while in a flat bed without using bedrails?: A Little Help needed moving from lying on your back to sitting on the side of a flat bed without using bedrails?: A Little Help needed moving to and from a bed to a chair (including a wheelchair)?: A Little Help needed standing up from a chair using your arms (e.g., wheelchair or bedside chair)?: A Little Help needed to walk in hospital room?: A Little Help needed climbing 3-5 steps with a railing? : A Lot 6 Click Score: 17    End of Session Equipment Utilized During Treatment: Gait belt Activity Tolerance: Treatment limited secondary to medical complications (Comment);No increased pain(Hypotension and bradycardia) Patient left:  in bed;with bed alarm set;with SCD's reapplied;with nursing/sitter in room;with call bell/phone within reach Nurse Communication: Mobility status PT Visit Diagnosis: Other abnormalities of gait and mobility (R26.89);Difficulty in walking, not elsewhere classified (R26.2)    Time: 1533-1600 PT Time Calculation (min) (ACUTE ONLY): 27 min   Charges:   PT Evaluation $PT Eval Low Complexity: 1 Low PT Treatments $Gait Training: 8-22 mins       Julien Girt, PT Acute Rehabilitation Services Pager [phone]  Office [phone]   Roxine Caddy D Elonda Husky 06/15/2019, 4:33 PM

## 2019-06-15 NOTE — Progress Notes (Signed)
Assisted Dr. Hollis with left, ultrasound guided, adductor canal block. Side rails up, monitors on throughout procedure. See vital signs in flow sheet. Tolerated Procedure well.  

## 2019-06-15 NOTE — Anesthesia Procedure Notes (Signed)
Anesthesia Regional Block: Adductor canal block   Pre-Anesthetic Checklist: ,, timeout performed, Correct Patient, Correct Site, Correct Laterality, Correct Procedure, Correct Position, site marked, Risks and benefits discussed,  Surgical consent,  Pre-op evaluation,  At surgeon's request and post-op pain management  Laterality: Left  Prep: chloraprep       Needles:  Injection technique: Single-shot  Needle Type: Echogenic Stimulator Needle     Needle Length: 9cm  Needle Gauge: 21     Additional Needles:   Procedures:,,,, ultrasound used (permanent image in chart),,,,  Narrative:  Start time: 06/15/2019 7:55 AM End time: 06/15/2019 8:05 AM Injection made incrementally with aspirations every 5 mL.  Performed by: Personally  Anesthesiologist: Effie Berkshire, MD  Additional Notes: Patient tolerated the procedure well. Local anesthetic introduced in an incremental fashion under minimal resistance after negative aspirations. No paresthesias were elicited. After completion of the procedure, no acute issues were identified and patient continued to be monitored by RN.

## 2019-06-15 NOTE — Anesthesia Postprocedure Evaluation (Signed)
Anesthesia Post Note  Patient: Kathy [name]  Procedure(s) Performed: TOTAL KNEE ARTHROPLASTY (Left Knee)     Patient location during evaluation: PACU Anesthesia Type: Spinal Level of consciousness: oriented and awake and alert Pain management: pain level controlled Vital Signs Assessment: post-procedure vital signs reviewed and stable Respiratory status: spontaneous breathing, respiratory function stable and patient connected to nasal cannula oxygen Cardiovascular status: blood pressure returned to baseline and stable Postop Assessment: no headache, no backache, no apparent nausea or vomiting and spinal receding Anesthetic complications: no    Last Vitals:  Vitals:   06/15/19 1135 06/15/19 1245  BP: 135/85 125/86  Pulse: 96 84  Resp: 13 18  Temp:  36.7 C  SpO2: 100% 100%     Effie Berkshire

## 2019-06-15 NOTE — Op Note (Signed)
NAME:  Kathy [name] RECORD NO.:  [id number]                             FACILITY:  [facility]      PHYSICIAN:  Pietro Cassis. Alvan Dame, M.D.  DATE OF BIRTH:  05-21-1951      DATE OF PROCEDURE:  06/15/2019                                     OPERATIVE REPORT         PREOPERATIVE DIAGNOSIS:  Left knee osteoarthritis.      POSTOPERATIVE DIAGNOSIS:  Left knee osteoarthritis.      FINDINGS:  The patient was noted to have complete loss of cartilage and   bone-on-bone arthritis with associated osteophytes in the medial and patellofemoral compartments of   the knee with a significant synovitis and associated effusion.  The patient had failed months of conservative treatment including medications, injection therapy, activity modification.     PROCEDURE:  Left total knee replacement.      COMPONENTS USED:  DePuy Attune rotating platform posterior stabilized knee   system, a size 5N femur, 4 tibia, size 8 mm PS AOX insert, and 35 anatomic patellar   button.      SURGEON:  Pietro Cassis. Alvan Dame, M.D.      ASSISTANT:  Danae Orleans, PA-C.      ANESTHESIA:  Regional and Spinal.      SPECIMENS:  None.      COMPLICATION:  None.      DRAINS:  None.  EBL: <100cc      TOURNIQUET TIME:   Total Tourniquet Time Documented: Thigh (Left) - 27 minutes Total: Thigh (Left) - 27 minutes  .      The patient was stable to the recovery room.      INDICATION FOR PROCEDURE:  Kathy [name] is a 68 y.o. female patient of   mine.  The patient had been seen, evaluated, and treated for months conservatively in the   office with medication, activity modification, and injections.  The patient had   radiographic changes of bone-on-bone arthritis with endplate sclerosis and osteophytes noted.  Based on the radiographic changes and failed conservative measures, the patient   decided to proceed with definitive treatment, total knee replacement.  Risks of infection, DVT, component failure,  need for revision surgery, neurovascular injury were reviewed in the office setting.  The postop course was reviewed stressing the efforts to maximize post-operative satisfaction and function.  Consent was obtained for benefit of pain   relief.      PROCEDURE IN DETAIL:  The patient was brought to the operative theater.   Once adequate anesthesia, preoperative antibiotics, 2 gm of Ancef,1 gm of Tranexamic Acid, and 10 mg of Decadron administered, the patient was positioned supine with a left thigh tourniquet placed.  The  left lower extremity was prepped and draped in sterile fashion.  A time-   out was performed identifying the patient, planned procedure, and the appropriate extremity.      The left lower extremity was placed in the [facility] leg holder.  The leg was   exsanguinated, tourniquet elevated to 250 mmHg.  A midline incision was   made  followed by median parapatellar arthrotomy.  Following initial   exposure, attention was first directed to the patella.  Precut   measurement was noted to be 22 mm.  I resected down to 13 mm and used a   35 anatomic patellar button to restore patellar height as well as cover the cut surface.      The lug holes were drilled and a metal shim was placed to protect the   patella from retractors and saw blade during the procedure.      At this point, attention was now directed to the femur.  The femoral   canal was opened with a drill, irrigated to try to prevent fat emboli.  An   intramedullary rod was passed at 3 degrees valgus, 9 mm of bone was   resected off the distal femur.  Following this resection, the tibia was   subluxated anteriorly.  Using the extramedullary guide, 2 mm of bone was resected off   the proximal medial tibia.  We confirmed the gap would be   stable medially and laterally with a size 6 spacer block as well as confirmed that the tibial cut was perpendicular in the coronal plane, checking with an alignment rod.      Once this was  done, I sized the femur to be a size 5 in the anterior-   posterior dimension, chose a narrow component based on medial and   lateral dimension.  The size 5 rotation block was then pinned in   position anterior referenced using the C-clamp to set rotation.  The   anterior, posterior, and  chamfer cuts were made without difficulty nor   notching making certain that I was along the anterior cortex to help   with flexion gap stability.      The final box cut was made off the lateral aspect of distal femur.      At this point, the tibia was sized to be a size 4.  The size 4 tray was   then pinned in position through the medial third of the tubercle,   drilled, and keel punched.  Trial reduction was now carried with a 5 femur,  4 tibia, a size 6 the 8 mm PS insert, and the 35 anatomic patella botton.  The knee was brought to full extension with good flexion stability with the patella   tracking through the trochlea without application of pressure.  Given   all these findings the trial components removed.  Final components were   opened and cement was mixed.  Sclerotic bone medially was drilled to allopw for cement interdigitation.  The knee was irrigated with normal saline solution and pulse lavage.  The synovial lining was   then injected with 30 cc of 0.25% Marcaine with epinephrine, 1 cc of Toradol and 30 cc of NS for a total of 61 cc.     Final implants were then cemented onto cleaned and dried cut surfaces of bone with the knee brought to extension with a size 8 mm PS trial insert.      Once the cement had fully cured, excess cement was removed   throughout the knee.  I confirmed that I was satisfied with the range of   motion and stability, and the final size 8 mm PS AOX insert was chosen.  It was   placed into the knee.      The tourniquet had been let down at 27 minutes.  No significant   hemostasis  was required.  The extensor mechanism was then reapproximated using #1 Vicryl and #1  Stratafix sutures with the knee   in flexion.  The   remaining wound was closed with 2-0 Vicryl and running 4-0 Monocryl.   The knee was cleaned, dried, dressed sterilely using Dermabond and   Aquacel dressing.  The patient was then   brought to recovery room in stable condition, tolerating the procedure   well.   Please note that Physician Assistant, Danae Orleans, PA-C was present for the entirety of the case, and was utilized for pre-operative positioning, peri-operative retractor management, general facilitation of the procedure and for primary wound closure at the end of the case.              Pietro Cassis Alvan Dame, M.D.    06/15/2019 10:06 AM

## 2019-06-15 NOTE — Transfer of Care (Signed)
Immediate Anesthesia Transfer of Care Note  Patient: Kathy [name]  Procedure(s) Performed: TOTAL KNEE ARTHROPLASTY (Left Knee)  Patient Location: PACU  Anesthesia Type:Spinal and MAC combined with regional for post-op pain  Level of Consciousness: awake, alert  and oriented  Airway & Oxygen Therapy: Patient Spontanous Breathing and Patient connected to face mask oxygen  Post-op Assessment: Report given to RN and Post -op Vital signs reviewed and stable  Post vital signs: Reviewed and stable  Last Vitals:  Vitals Value Taken Time  BP 129/78 06/15/19 1037  Temp    Pulse 84 06/15/19 1040  Resp 18 06/15/19 1040  SpO2 100 % 06/15/19 1040  Vitals shown include unvalidated device data.  Last Pain:  Vitals:   06/15/19 0625  TempSrc: Oral      Patients Stated Pain Goal: 3 (61/68/37 2902)  Complications: No apparent anesthesia complications

## 2019-06-15 NOTE — Interval H&P Note (Signed)
History and Physical Interval Note:  06/15/2019 7:10 AM  Kathy [name]  has presented today for surgery, with the diagnosis of Left knee osteoarthritis.  The various methods of treatment have been discussed with the patient and family. After consideration of risks, benefits and other options for treatment, the patient has consented to  Procedure(s) with comments: TOTAL KNEE ARTHROPLASTY (Left) - 70 mins as a surgical intervention.  The patient's history has been reviewed, patient examined, no change in status, stable for surgery.  I have reviewed the patient's chart and labs.  Questions were answered to the patient's satisfaction.     Mauri Pole

## 2019-06-15 NOTE — Anesthesia Procedure Notes (Signed)
Spinal  Start time: 06/15/2019 8:44 AM End time: 06/15/2019 8:46 AM Staffing Anesthesiologist: Effie Berkshire, MD Performed: anesthesiologist  Preanesthetic Checklist Completed: patient identified, site marked, surgical consent, pre-op evaluation, timeout performed, IV checked, risks and benefits discussed and monitors and equipment checked Spinal Block Patient position: sitting Prep: site prepped and draped and DuraPrep Location: L3-4 Injection technique: single-shot Needle Needle type: Pencan  Needle gauge: 24 G Needle length: 10 cm Needle insertion depth: 10 cm Additional Notes Patient tolerated well. No immediate complications.

## 2019-06-15 NOTE — Discharge Instructions (Signed)

## 2019-06-16 DIAGNOSIS — M25762 Osteophyte, left knee: Secondary | ICD-10-CM | POA: Diagnosis not present

## 2019-06-16 DIAGNOSIS — Z791 Long term (current) use of non-steroidal anti-inflammatories (NSAID): Secondary | ICD-10-CM | POA: Diagnosis not present

## 2019-06-16 DIAGNOSIS — M1712 Unilateral primary osteoarthritis, left knee: Secondary | ICD-10-CM | POA: Diagnosis not present

## 2019-06-16 DIAGNOSIS — I10 Essential (primary) hypertension: Secondary | ICD-10-CM | POA: Diagnosis not present

## 2019-06-16 DIAGNOSIS — Z1159 Encounter for screening for other viral diseases: Secondary | ICD-10-CM | POA: Diagnosis not present

## 2019-06-16 DIAGNOSIS — E669 Obesity, unspecified: Secondary | ICD-10-CM | POA: Diagnosis present

## 2019-06-16 LAB — BASIC METABOLIC PANEL
Anion gap: 11 (ref 5–15)
BUN: 16 mg/dL (ref 8–23)
CO2: 22 mmol/L (ref 22–32)
Calcium: 8.5 mg/dL — ABNORMAL LOW (ref 8.9–10.3)
Chloride: 102 mmol/L (ref 98–111)
Creatinine, Ser: 0.59 mg/dL (ref 0.44–1.00)
GFR calc Af Amer: >60 mL/min (ref >60)
GFR calc non Af Amer: >60 mL/min (ref >60)
Glucose, Bld: 141 mg/dL — ABNORMAL HIGH (ref 70–99)
Potassium: 3.6 mmol/L (ref 3.5–5.1)
Sodium: 135 mmol/L (ref 135–145)

## 2019-06-16 LAB — CBC
HCT: 34.2 % — ABNORMAL LOW (ref 36.0–46.0)
Hemoglobin: 11.4 g/dL — ABNORMAL LOW (ref 12.0–15.0)
MCH: 30.6 pg (ref 26.0–34.0)
MCHC: 33.3 g/dL (ref 30.0–36.0)
MCV: 91.7 fL (ref 80.0–100.0)
Platelets: 252 10*3/uL (ref 150–400)
RBC: 3.73 MIL/uL — ABNORMAL LOW (ref 3.87–5.11)
RDW: 11.5 % (ref 11.5–15.5)
WBC: 13.9 10*3/uL — ABNORMAL HIGH (ref 4.0–10.5)
nRBC: 0 % (ref 0.0–0.2)

## 2019-06-16 MED ORDER — TRAMADOL HCL 50 MG PO TABS: 50.0000 mg | ORAL_TABLET | Freq: Four times a day (QID) | ORAL | 0 refills | Status: AC | PRN

## 2019-06-16 MED ORDER — ACETAMINOPHEN 500 MG PO TABS: 1000.0000 mg | ORAL_TABLET | Freq: Three times a day (TID) | ORAL | 0 refills | Status: DC

## 2019-06-16 MED ORDER — POLYETHYLENE GLYCOL 3350 17 G PO PACK: 17.0000 g | PACK | Freq: Two times a day (BID) | ORAL | 0 refills | Status: DC

## 2019-06-16 MED ORDER — DOCUSATE SODIUM 100 MG PO CAPS: 100.0000 mg | ORAL_CAPSULE | Freq: Two times a day (BID) | ORAL | 0 refills | Status: DC

## 2019-06-16 MED ORDER — FERROUS SULFATE 325 (65 FE) MG PO TABS: 325.0000 mg | ORAL_TABLET | Freq: Three times a day (TID) | ORAL | 0 refills | Status: DC

## 2019-06-16 MED ORDER — METHOCARBAMOL 500 MG PO TABS: 500.0000 mg | ORAL_TABLET | Freq: Four times a day (QID) | ORAL | 0 refills | Status: DC | PRN

## 2019-06-16 MED ORDER — ASPIRIN 81 MG PO CHEW: 81.0000 mg | CHEWABLE_TABLET | Freq: Two times a day (BID) | ORAL | 0 refills | Status: AC

## 2019-06-16 NOTE — Progress Notes (Signed)
Discharge Plan of Care:  Outpatient PT Has DME ( 3 IN 1 and RW)

## 2019-06-16 NOTE — Progress Notes (Signed)
Physical Therapy Progress Note  Clinical Impression: Pt seen for LE strengthening therex. PT provided HEP handout and reviewed in full with pt. PT provided pt education re: ice, positioning, generalized walking program and car transfers with demonstration. Pt would continue to benefit from skilled physical therapy services at this time while admitted and after d/c to address the below listed limitations in order to improve overall safety and independence with functional mobility.  Sherie Don, [place], DPT  Acute Rehabilitation Services Pager [phone] Office [phone]    06/16/19 0900  PT Visit Information  Last PT Received On 06/16/19  Assistance Needed +1  History of Present Illness Pt is a 68 y/o female s/p L TKR on 06/15/19. PMH includes HTN, macular degeneration, fibromyalgia, sarcoidosis.  Precautions  Precautions Fall;Knee  Precaution Booklet Issued Yes (comment)  Precaution Comments reviewed positioning of LE following knee sx with pt  Restrictions  Weight Bearing Restrictions Yes  LLE Weight Bearing WBAT  Pain Assessment  Pain Assessment 0-10  Pain Score 4  Pain Location L knee  Pain Descriptors / Indicators Sore  Pain Intervention(s) Monitored during session;Repositioned  Cognition  Arousal/Alertness Awake/alert  Behavior During Therapy WFL for tasks assessed/performed  Overall Cognitive Status Within Functional Limits for tasks assessed  General Comments very sweet and motivated to move  Exercises  Exercises Total Joint  Total Joint Exercises  Ankle Circles/Pumps AROM;Both;Supine;20 reps  Quad Sets AROM;Strengthening;Left;10 reps;Supine  Short Arc Quad AROM;Strengthening;Left;10 reps;Supine  Heel Slides AAROM;Left;10 reps;Supine  Hip ABduction/ADduction AROM;Left;10 reps;Supine  Straight Leg Raises Left;10 reps;Supine;AROM  PT - End of Session  Activity Tolerance Patient tolerated treatment well  Patient left in bed;with call bell/phone within reach  Nurse  Communication Mobility status   PT - Assessment/Plan  PT Plan Current plan remains appropriate  PT Visit Diagnosis Other abnormalities of gait and mobility (R26.89);Difficulty in walking, not elsewhere classified (R26.2)  PT Frequency (ACUTE ONLY) 7X/week  Follow Up Recommendations Supervision for mobility/OOB (OPPT)  PT equipment None recommended by PT  AM-PAC PT "6 Clicks" Mobility Outcome Measure (Version 2)  Help needed turning from your back to your side while in a flat bed without using bedrails? 3  Help needed moving from lying on your back to sitting on the side of a flat bed without using bedrails? 3  Help needed moving to and from a bed to a chair (including a wheelchair)? 3  Help needed standing up from a chair using your arms (e.g., wheelchair or bedside chair)? 3  Help needed to walk in hospital room? 3  Help needed climbing 3-5 steps with a railing?  2  6 Click Score 17  Consider Recommendation of Discharge To: Home with [facility]  PT Goal Progression  Progress towards PT goals Progressing toward goals  Acute Rehab PT Goals  PT Goal Formulation With patient  Time For Goal Achievement 06/22/19  Potential to Achieve Goals Good  PT Time Calculation  PT Start Time (ACUTE ONLY) 0903  PT Stop Time (ACUTE ONLY) 0916  PT Time Calculation (min) (ACUTE ONLY) 13 min  PT General Charges  $$ ACUTE PT VISIT 1 Visit  PT Treatments  $Therapeutic Exercise 8-22 mins

## 2019-06-16 NOTE — Plan of Care (Signed)
  Problem: Education: Goal: Knowledge of the prescribed therapeutic regimen will improve Outcome: Progressing   Problem: Activity: Goal: Ability to avoid complications of mobility impairment will improve Outcome: Progressing   Problem: Pain Management: Goal: Pain level will decrease with appropriate interventions Outcome: Progressing   

## 2019-06-16 NOTE — Progress Notes (Signed)
     Subjective: 1 Day Post-Op Procedure(s) (LRB): TOTAL KNEE ARTHROPLASTY (Left)   Patient reports pain as mild, pain controlled. No reported events throughout the night.  Feels that she is ready to progress from surgery.  Dr. Alvan Dame discussed the procedure and expectations moving forward.   Patient's anticipated LOS is less than 2 midnights, meeting these requirements: - Lives within 1 hour of care - Has a competent adult at home to recover with post-op recover - NO history of  - Chronic pain requiring opiods  - Diabetes  - Coronary Artery Disease  - Heart failure  - Heart attack  - Stroke  - DVT/VTE  - Cardiac arrhythmia  - Respiratory Failure/COPD  - Renal failure  - Anemia  - Advanced Liver disease     Objective:   VITALS:   Vitals:   06/16/19 0047 06/16/19 0524  BP: 134/76 (!) 147/89  Pulse: 74 82  Resp: 14 16  Temp: 97.8 F (36.6 C) 97.6 F (36.4 C)  SpO2: 99% 100%    Dorsiflexion/Plantar flexion intact Incision: dressing C/D/I No cellulitis present Compartment soft  LABS Recent Labs    06/13/19 1011 06/16/19 0259  HGB 13.6 11.4*  HCT 41.0 34.2*  WBC 5.7 13.9*  PLT 302 252    Recent Labs    06/13/19 1011 06/16/19 0259  NA 139 135  K 3.6 3.6  BUN 20 16  CREATININE 0.75 0.59  GLUCOSE 95 141*     Assessment/Plan: 1 Day Post-Op Procedure(s) (LRB): TOTAL KNEE ARTHROPLASTY (Left) Foley cath d/c'ed Advance diet Up with therapy D/C IV fluids Discharge home Follow up in 2 weeks at [facility] ([place]). Follow up with OLIN,Kensley Lares D in 2 weeks.  Contact information:  [facility]) [address], [place] [phone]    Obese (BMI 30-39.9) Estimated body mass index is 33.95 kg/m as calculated from the following:   Height as of this encounter: 5\' 2"  (1.575 m).   Weight as of this encounter: 84.2 kg. Patient also counseled that weight may inhibit  the healing process Patient counseled that losing weight will help with future health issues       [place] Pugh. Brently Voorhis   PAC  06/16/2019, 9:09 AM

## 2019-06-16 NOTE — Progress Notes (Signed)
Physical Therapy Treatment Patient Details Name: Kathy [name] MRN: [id number] DOB: 19-Dec-1950 Today's Date: 06/16/2019    History of Present Illness Pt is a 68 y/o female s/p L TKR on 06/15/19. PMH includes HTN, macular degeneration, fibromyalgia, sarcoidosis.    PT Comments    POD # 1 am session Assisted OOB to amb in hallway.  General transfer comment: 25% VC's on safety with turns.  Then returned to room to perform some TE's following HEP handout.  Instructed on proper tech, freq as well as use of ICE.   Pt will need another PT session to address stairs.    Follow Up Recommendations  Supervision for mobility/OOB     Equipment Recommendations  None recommended by PT    Recommendations for Other Services       Precautions / Restrictions Precautions Precautions: Fall;Knee Precaution Booklet Issued: Yes (comment) Precaution Comments: reviewed positioning of LE following knee sx with pt Restrictions Weight Bearing Restrictions: No LLE Weight Bearing: Weight bearing as tolerated    Mobility  Bed Mobility Overal bed mobility: Needs Assistance Bed Mobility: Supine to Sit           General bed mobility comments: light assist LE and increased time  Transfers Overall transfer level: Needs assistance Equipment used: Rolling walker (2 wheeled) Transfers: Sit to/from Stand Sit to Stand: Supervision;Min guard         General transfer comment: 25% VC's on safety with turns  Ambulation/Gait Ambulation/Gait assistance: Supervision;Min guard Gait Distance (Feet): 75 Feet Assistive device: Rolling walker (2 wheeled) Gait Pattern/deviations: Step-to pattern;Decreased stride length;Trunk flexed     General Gait Details: 255 VC's on proper sequencing and proper walker to self distance   Stairs             Wheelchair Mobility    Modified Rankin (Stroke Patients Only)       Balance                                             Cognition Arousal/Alertness: Awake/alert Behavior During Therapy: WFL for tasks assessed/performed Overall Cognitive Status: Within Functional Limits for tasks assessed                                 General Comments: pleasant      Exercises Total Knee Replacement TE's 10 reps B LE ankle pumps 10 reps towel squeezes 10 reps knee presses 10 reps heel slides  10 reps SAQ's 10 reps SLR's 10 reps ABD Followed by ICE   General Comments        Pertinent Vitals/Pain Pain Assessment: 0-10 Pain Score: 4  Pain Location: L knee Pain Descriptors / Indicators: Sore Pain Intervention(s): Monitored during session;Premedicated before session;Repositioned;Ice applied    Home Living                      Prior Function            PT Goals (current goals can now be found in the care plan section) Acute Rehab PT Goals PT Goal Formulation: With patient Time For Goal Achievement: 06/22/19 Potential to Achieve Goals: Good Progress towards PT goals: Progressing toward goals    Frequency    7X/week      PT Plan Current plan remains appropriate    Co-evaluation  AM-PAC PT "6 Clicks" Mobility   Outcome Measure  Help needed turning from your back to your side while in a flat bed without using bedrails?: A Little Help needed moving from lying on your back to sitting on the side of a flat bed without using bedrails?: A Little Help needed moving to and from a bed to a chair (including a wheelchair)?: A Little Help needed standing up from a chair using your arms (e.g., wheelchair or bedside chair)?: A Little Help needed to walk in hospital room?: A Little Help needed climbing 3-5 steps with a railing? : A Little 6 Click Score: 18    End of Session Equipment Utilized During Treatment: Gait belt Activity Tolerance: Patient tolerated treatment well Patient left: in chair;with call bell/phone within reach Nurse Communication: Mobility  status PT Visit Diagnosis: Other abnormalities of gait and mobility (R26.89);Difficulty in walking, not elsewhere classified (R26.2)     Time: 0737-1062 PT Time Calculation (min) (ACUTE ONLY): 25 min  Charges:  $Gait Training: 8-22 mins $Therapeutic Exercise: 8-22 mins                     Rica Koyanagi  PTA Acute  Rehabilitation Services Pager      [phone] Office      [phone]

## 2019-06-16 NOTE — Progress Notes (Signed)
Physical Therapy Treatment Patient Details Name: Kathy [name] MRN: [id number] DOB: [birth year] Today's Date: 06/16/2019    History of Present Illness Pt is a 68 y/o female s/p L TKR on 06/15/19. PMH includes HTN, macular degeneration, fibromyalgia, sarcoidosis.    PT Comments    POD # 1 pm session Assisted OOB to bathroom then in hallway to practice stairs.  Pt has met goals to D/C to home today.  Addressed all mobility questions, discussed appropriate activity, educated on use of ICE.  Pt ready for D/C to home.   Follow Up Recommendations  Supervision for mobility/OOB     Equipment Recommendations  None recommended by PT    Recommendations for Other Services       Precautions / Restrictions Precautions Precautions: Fall;Knee Restrictions Weight Bearing Restrictions: No LLE Weight Bearing: Weight bearing as tolerated    Mobility  Bed Mobility Overal bed mobility: Needs Assistance Bed Mobility: Supine to Sit           General bed mobility comments: light assist LE and increased time  Transfers Overall transfer level: Needs assistance Equipment used: Rolling walker (2 wheeled) Transfers: Sit to/from Stand Sit to Stand: Supervision;Min guard         General transfer comment: 25% VC's on safety with turns  Ambulation/Gait Ambulation/Gait assistance: Supervision;Min guard Gait Distance (Feet): 42 Feet Assistive device: Rolling walker (2 wheeled) Gait Pattern/deviations: Step-to pattern;Decreased stride length;Trunk flexed     General Gait Details: 25% VC's on proper sequencing and proper walker to self distance   Stairs Stairs: Yes Stairs assistance: Min guard;Min assist Stair Management: Two rails;Step to pattern Number of Stairs: 2 General stair comments: 25% VC's on proper sequencing and safety   Wheelchair Mobility    Modified Rankin (Stroke Patients Only)       Balance                                             Cognition Arousal/Alertness: Awake/alert Behavior During Therapy: WFL for tasks assessed/performed Overall Cognitive Status: Within Functional Limits for tasks assessed                                 General Comments: pleasant      Exercises      General Comments        Pertinent Vitals/Pain Pain Assessment: 0-10 Pain Score: 4  Pain Location: L knee Pain Descriptors / Indicators: Sore Pain Intervention(s): Monitored during session;Premedicated before session;Repositioned;Ice applied    Home Living                      Prior Function            PT Goals (current goals can now be found in the care plan section) Progress towards PT goals: Progressing toward goals    Frequency    7X/week      PT Plan Current plan remains appropriate    Co-evaluation              AM-PAC PT "6 Clicks" Mobility   Outcome Measure  Help needed turning from your back to your side while in a flat bed without using bedrails?: A Little Help needed moving from lying on your back to sitting on the side of a flat bed without using bedrails?:  A Little Help needed moving to and from a bed to a chair (including a wheelchair)?: A Little Help needed standing up from a chair using your arms (e.g., wheelchair or bedside chair)?: A Little Help needed to walk in hospital room?: A Little Help needed climbing 3-5 steps with a railing? : A Little 6 Click Score: 18    End of Session Equipment Utilized During Treatment: Gait belt Activity Tolerance: Patient tolerated treatment well Patient left: in chair;with call bell/phone within reach Nurse Communication: Mobility status PT Visit Diagnosis: Other abnormalities of gait and mobility (R26.89);Difficulty in walking, not elsewhere classified (R26.2)     Time: 5537-4827 PT Time Calculation (min) (ACUTE ONLY): 25 min  Charges:  $Gait Training: 8-22 mins $Therapeutic Activity: 8-22 mins                     Rica Koyanagi  PTA Acute  Rehabilitation Services Pager      [phone] Office      [phone]

## 2019-06-16 NOTE — Care Management Obs Status (Signed)
[facility] NOTIFICATION   Patient Details  Name: Kathy [name] MRN: [id number] Date of Birth: December 31, 1950   Medicare Observation Status Notification Given:  Yes    Lia Hopping, [place] 06/16/2019, 10:33 AM

## 2019-06-19 ENCOUNTER — Encounter (HOSPITAL_COMMUNITY): Payer: Self-pay | Admitting: Orthopedic Surgery

## 2019-06-19 DIAGNOSIS — M25562 Pain in left knee: Secondary | ICD-10-CM | POA: Diagnosis not present

## 2019-06-20 NOTE — Discharge Summary (Signed)
Physician Discharge Summary  Patient ID: MYNDI WAMBLE MRN: [id number] DOB/AGE: Apr 27, 1951 68 y.o.  Admit date: 06/15/2019 Discharge date: 06/16/2019   Procedures:  Procedure(s) (LRB): TOTAL KNEE ARTHROPLASTY (Left)  Attending Physician:  Dr. Paralee Cancel   Admission Diagnoses:   Left knee primary OA / pain  Discharge Diagnoses:  Principal Problem:   S/P left TKA Active Problems:   Obese  Past Medical History:  Diagnosis Date  . Exotropia of left eye   . Fibromyalgia   . History of uterine leiomyoma   . Hypertension   . Macular degeneration   . OA (osteoarthritis)   . Overactive bladder   . Sarcoidosis   . Wears glasses     HPI:    Kathy [name] July, 68 y.o. female, has a history of pain and functional disability in the left knee due to arthritis and has failed non-surgical conservative treatments for greater than 12 weeks to include NSAID's and/or analgesics, corticosteriod injections and activity modification.  Onset of symptoms was gradual, starting 1+ years ago with gradually worsening course since that time. The patient noted prior procedures on the knee to include  arthroplasty on the right knee by Dr. Alvan Dame at least 7 years ago.  Patient currently rates pain in the left knee(s) at 10 out of 10 with activity. Patient has night pain, worsening of pain with activity and weight bearing, pain that interferes with activities of daily living, pain with passive range of motion, crepitus and joint swelling.  Patient has evidence of periarticular osteophytes and joint space narrowing by imaging studies.  There is no active infection.  Risks, benefits and expectations were discussed with the patient.  Risks including but not limited to the risk of anesthesia, blood clots, nerve damage, blood vessel damage, failure of the prosthesis, infection and up to and including death.  Patient understand the risks, benefits and expectations and wishes to proceed with surgery.   PCP:  Denita Lung, MD   Discharged Condition: good  Hospital Course:  Patient underwent the above stated procedure on 06/15/2019. Patient tolerated the procedure well and brought to the recovery room in good condition and subsequently to the floor.  POD #1 BP: 147/89 ; Pulse: 82 ; Temp: 97.6 F (36.4 C) ; Resp: 16 Patient reports pain as mild, pain controlled. No reported events throughout the night.  Feels that she is ready to progress from surgery.  Dr. Alvan Dame discussed the procedure and expectations moving forward.  Ready to be discharged home.   LABS  Basename    HGB     11.4  HCT     34.2    Discharge Exam: General appearance: alert, cooperative and no distress Extremities: Homans sign is negative, no sign of DVT, no edema, redness or tenderness in the calves or thighs and no ulcers, gangrene or trophic changes  Disposition:  Home with follow up in 2 weeks   Follow-up Information    Paralee Cancel, MD. Schedule an appointment as soon as possible for a visit in 2 weeks.   Specialty: Orthopedic Surgery Contact information: [address] [place] [address] [phone]           Discharge Instructions    Call MD / Call 911   Complete by: As directed    If you experience chest pain or shortness of breath, CALL 911 and be transported to the hospital emergency room.  If you develope a fever above 101 F, pus (white drainage) or increased  drainage or redness at the wound, or calf pain, call your surgeon's office.   Change dressing   Complete by: As directed    Maintain surgical dressing until follow up in the clinic. If the edges start to pull up, may reinforce with tape. If the dressing is no longer working, may remove and cover with gauze and tape, but must keep the area dry and clean.  Call with any questions or concerns.   Constipation Prevention   Complete by: As directed    Drink plenty of fluids.  Prune juice may be helpful.  You may use a stool softener,  such as Colace (over the counter) 100 mg twice a day.  Use MiraLax (over the counter) for constipation as needed.   Diet - low sodium heart healthy   Complete by: As directed    Discharge instructions   Complete by: As directed    Maintain surgical dressing until follow up in the clinic. If the edges start to pull up, may reinforce with tape. If the dressing is no longer working, may remove and cover with gauze and tape, but must keep the area dry and clean.  Follow up in 2 weeks at [facility]. Call with any questions or concerns.   Increase activity slowly as tolerated   Complete by: As directed    Weight bearing as tolerated with assist device (walker, cane, etc) as directed, use it as long as suggested by your surgeon or therapist, typically at least 4-6 weeks.   TED hose   Complete by: As directed    Use stockings (TED hose) for 2 weeks on both leg(s).  You may remove them at night for sleeping.      Allergies as of 06/16/2019      Reactions   Lortab [hydrocodone-acetaminophen] Nausea And Vomiting   Latex Other (See Comments)   Pt unsure of reaction!      Medication List    STOP taking these medications   meloxicam 7.5 MG tablet Commonly known as: MOBIC     TAKE these medications   acetaminophen 500 MG tablet Commonly known as: TYLENOL Take 2 tablets (1,000 mg total) by mouth every 8 (eight) hours. What changed:   when to take this  reasons to take this   aspirin 81 MG chewable tablet Commonly known as: Aspirin Childrens Chew 1 tablet (81 mg total) by mouth 2 (two) times daily. Take for 4 weeks, then resume regular dose.   cycloSPORINE 0.05 % ophthalmic emulsion Commonly known as: RESTASIS Place 1 drop into both eyes 2 (two) times daily.   docusate sodium 100 MG capsule Commonly known as: Colace Take 1 capsule (100 mg total) by mouth 2 (two) times daily.   ferrous sulfate 325 (65 FE) MG tablet Commonly known as: FerrouSul Take 1 tablet (325 mg  total) by mouth 3 (three) times daily with meals for 14 days.   methocarbamol 500 MG tablet Commonly known as: Robaxin Take 1 tablet (500 mg total) by mouth every 6 (six) hours as needed for muscle spasms.   Olmesartan-amLODIPine-HCTZ 40-10-12.5 MG Tabs TAKE 1 TABLET BY MOUTH EVERY DAY What changed:   how much to take  how to take this  when to take this  additional instructions   polyethylene glycol 17 g packet Commonly known as: MIRALAX / GLYCOLAX Take 17 g by mouth 2 (two) times daily.   PreserVision AREDS 2 Caps Take 1 capsule by mouth 2 (two) times a day.   traMADol 50  MG tablet Commonly known as: Ultram Take 1-2 tablets (50-100 mg total) by mouth every 6 (six) hours as needed for up to 5 days.            Discharge Care Instructions  (From admission, onward)         Start     Ordered   06/16/19 0000  Change dressing    Comments: Maintain surgical dressing until follow up in the clinic. If the edges start to pull up, may reinforce with tape. If the dressing is no longer working, may remove and cover with gauze and tape, but must keep the area dry and clean.  Call with any questions or concerns.   06/16/19 0913           Signed: [place] Pugh. Dainelle Hun   PA-C  06/20/2019, 8:30 AM

## 2019-06-21 DIAGNOSIS — M25562 Pain in left knee: Secondary | ICD-10-CM | POA: Diagnosis not present

## 2019-06-26 DIAGNOSIS — M25562 Pain in left knee: Secondary | ICD-10-CM | POA: Diagnosis not present

## 2019-06-28 DIAGNOSIS — M25562 Pain in left knee: Secondary | ICD-10-CM | POA: Diagnosis not present

## 2019-06-30 DIAGNOSIS — M25562 Pain in left knee: Secondary | ICD-10-CM | POA: Diagnosis not present

## 2019-07-03 DIAGNOSIS — M25562 Pain in left knee: Secondary | ICD-10-CM | POA: Diagnosis not present

## 2019-07-05 DIAGNOSIS — M25562 Pain in left knee: Secondary | ICD-10-CM | POA: Diagnosis not present

## 2019-07-10 DIAGNOSIS — M25562 Pain in left knee: Secondary | ICD-10-CM | POA: Diagnosis not present

## 2019-07-12 DIAGNOSIS — M25562 Pain in left knee: Secondary | ICD-10-CM | POA: Diagnosis not present

## 2019-07-19 DIAGNOSIS — M25562 Pain in left knee: Secondary | ICD-10-CM | POA: Diagnosis not present

## 2019-07-19 NOTE — Progress Notes (Signed)
[facility] Note  07/20/2019     CHIEF COMPLAINT Patient presents for Retina Follow Up   HISTORY OF PRESENT ILLNESS: Kathy [name] is a 68 y.o. female who presents to the clinic today for:    HPI    Retina Follow Up    Patient presents with  Other.  In right eye.  This started 1 year ago.  Since onset it is gradually improving.  I, the attending physician,  performed the HPI with the patient and updated documentation appropriately.          Comments    F/U Rt OS. Patient states her vision has improved since cataract sx ou in June 2020, denies new visual onsets/issues. Pt is using Restasis gtt's .          Last edited by Bernarda Caffey, MD on 07/20/2019  9:46 AM. (History)    pt recently got new glasses with Dr. Gershon Crane, pt had cataract with Dr. Gershon Crane in June   Referring physician: Denita Lung, MD [place],  [place] [id number]  HISTORICAL INFORMATION:   Selected notes from the MEDICAL RECORD NUMBER Referred by Dr. Gevena Cotton for concern of central areolar choroidal dystrophy LEE: 05.16.19 (M. Frederico Hamman) [BCVA: OD: 20/30-2 OS: CF] Ocular Hx-Central areolar choroidal dystrophy, Macular Degeneration, exotropia OS, cataracts OU, dry eyes PMH-HTN, Fibromyalgia     CURRENT MEDICATIONS: Current Outpatient Medications (Ophthalmic Drugs)  Medication Sig  . cycloSPORINE (RESTASIS) 0.05 % ophthalmic emulsion Place 1 drop into both eyes 2 (two) times daily.   No current facility-administered medications for this visit.  (Ophthalmic Drugs)   Current Outpatient Medications (Other)  Medication Sig  . acetaminophen (TYLENOL) 500 MG tablet Take 2 tablets (1,000 mg total) by mouth every 8 (eight) hours.  . docusate sodium (COLACE) 100 MG capsule Take 1 capsule (100 mg total) by mouth 2 (two) times daily.  . Multiple Vitamins-Minerals (PRESERVISION AREDS 2) CAPS Take 1 capsule by mouth 2 (two) times a day.  .  Olmesartan-amLODIPine-HCTZ 40-10-12.5 MG TABS TAKE 1 TABLET BY MOUTH EVERY DAY (Patient taking differently: Take 1 tablet by mouth daily. )  . polyethylene glycol (MIRALAX / GLYCOLAX) 17 g packet Take 17 g by mouth 2 (two) times daily.  . ferrous sulfate (FERROUSUL) 325 (65 FE) MG tablet Take 1 tablet (325 mg total) by mouth 3 (three) times daily with meals for 14 days.  . methocarbamol (ROBAXIN) 500 MG tablet Take 1 tablet (500 mg total) by mouth every 6 (six) hours as needed for muscle spasms. (Patient not taking: Reported on 07/20/2019)   No current facility-administered medications for this visit.  (Other)      REVIEW OF SYSTEMS: ROS    Positive for: Eyes   Negative for: Constitutional, Gastrointestinal, Neurological, Skin, Genitourinary, Musculoskeletal, HENT, Endocrine, Cardiovascular, Respiratory, Psychiatric, Allergic/Imm, Heme/Lymph   Last edited by Zenovia Jordan, LPN on 3/79/0240  9:73 AM. (History)       ALLERGIES Allergies  Allergen Reactions  . Lortab [Hydrocodone-Acetaminophen] Nausea And Vomiting  . Latex Other (See Comments)    Pt unsure of reaction!    PAST MEDICAL HISTORY Past Medical History:  Diagnosis Date  . Exotropia of left eye   . Fibromyalgia   . History of uterine leiomyoma   . Hypertension   . Macular degeneration   . OA (osteoarthritis)   . Overactive bladder   . Sarcoidosis   . Wears glasses    Past Surgical History:  Procedure  Laterality Date  . ABDOMINAL HYSTERECTOMY  2013  . ADJUSTABLE SUTURE MANIPULATION Left 04/10/2015   Procedure: ADJUSTABLE SUTURE MANIPULATION;  Surgeon: Gevena Cotton, MD;  Location: [facility];  Service: Ophthalmology;  Laterality: Left;  . EYE SURGERY    . MEDIAN RECTUS REPAIR Left 04/10/2015   Procedure: MEDIAN RECTUS RESECTION LATERAL RECTUS RECESSION,AJUSTABLES SUTURES LEFT EYE;  Surgeon: Gevena Cotton, MD;  Location: [facility];  Service: Ophthalmology;  Laterality: Left;  .  TONSILLECTOMY  as child  . TOTAL ABDOMINAL HYSTERECTOMY W/ BILATERAL SALPINGOOPHORECTOMY  10-14-2010   and TVT midurethral sling and culdoplasty  . TOTAL KNEE ARTHROPLASTY Left 06/15/2019   Procedure: TOTAL KNEE ARTHROPLASTY;  Surgeon: Paralee Cancel, MD;  Location: [facility];  Service: Orthopedics;  Laterality: Left;  70 mins    FAMILY HISTORY Family History  Problem Relation Age of Onset  . Colon cancer Mother   . Diabetes Father   . Diabetes Sister   . Hypertension Sister   . Diabetes Sister   . Hypertension Sister   . Hypertension Sister     SOCIAL HISTORY Social History   Tobacco Use  . Smoking status: Never Smoker  . Smokeless tobacco: Never Used  Substance Use Topics  . Alcohol use: Yes    Alcohol/week: 1.0 standard drinks    Types: 1 Glasses of wine per week    Comment: rare  . Drug use: No         OPHTHALMIC EXAM:  Base Eye Exam    Visual Acuity (Snellen - Linear)      Right Left   Dist cc 20/50 CF at 2'   Dist ph cc 20/40        Tonometry (Tonopen, 9:36 AM)      Right Left   Pressure 16 18       Pupils      Dark Light Shape React APD   Right 4 3 Round Brisk None   Left 4 3 Round Brisk None       Visual Fields (Counting fingers)      Left Right    Full Full       Extraocular Movement      Right Left    Full Full       Neuro/Psych    Oriented x3: Yes   Mood/Affect: Normal       Dilation    Both eyes: 1.0% Mydriacyl, 2.5% Phenylephrine @ 9:36 AM       Dilation #2    Both eyes: 10 % phenylepherine, 1% mydriacyl @ 9:47 AM        Slit Lamp and Fundus Exam    Slit Lamp Exam      Right Left   Lids/Lashes Dermatochalasis - upper lid Dermatochalasis - upper lid   Conjunctiva/Sclera mild Melanosis mild Melanosis   Cornea Arcus, 1+ PEE, Well healed temporal cataract wounds Arcus, linear scar at 0430, Well healed temporal cataract wounds   Anterior Chamber Deep and quiet Deep and quiet   Iris Round and reactive, patches of atrophy at 1130  and 0900 Round and reactive, mild patches of atrophy   Lens PC IOL in good position, mild pigment on optic PC IOL in good position, mild pigment on optic   Vitreous Vitreous syneresis, Posterior vitreous detachment, vitreous condensations Vitreous syneresis       Fundus Exam      Right Left   Disc Sharp rim, mild pallor, mild PPP Tilted disc, mild palloralmost 360 degrees  of PPA   C/D Ratio 0.3 0.4   Macula large area of RPE and chorodial atrophy with small central foveal island spared; central island w/pigment clumping around edges large area of choroidal and retinal pigment epithelial atrophy extending to nasal side of disc, Pigment clumping, No heme    Vessels Vascular attenuation Vascular attenuation   Periphery Attached,  vitreous condensations at 0300 with retinal break and +SRF / focal RD within bed of retinoschisis -- stable with good laser surrounding, ?schisis ST periphery Attached             IMAGING AND PROCEDURES  Imaging and Procedures for @TODAY @  OCT, Retina - OU - Both Eyes       Right Eye Quality was good. Central Foveal Thickness: 193. Progression has been stable. Findings include abnormal foveal contour, inner retinal atrophy, outer retinal atrophy, no SRF, no IRF, retinal drusen  (Mild interval progression of ORA).   Left Eye Quality was borderline. Central Foveal Thickness: 203. Progression has been stable. Findings include outer retinal atrophy, inner retinal atrophy, abnormal foveal contour, no IRF, no SRF (Diffuse central atrophy).   Notes *Images captured and stored on drive  Diagnosis / Impression:  Stable from prior Diffuse atrophy OU OD with central island of non-atrophied retina -- mild interval progression of ORA Central areolar choroidal dystrophy OU Retinoschisis nasal periphery OD -- caught on widefield  Clinical management:  See below  Abbreviations: NFP - Normal foveal profile. CME - cystoid macular edema. PED - pigment epithelial  detachment. IRF - intraretinal fluid. SRF - subretinal fluid. EZ - ellipsoid zone. ERM - epiretinal membrane. ORA - outer retinal atrophy. ORT - outer retinal tubulation. SRHM - subretinal hyper-reflective material        Color Fundus Photography Optos - OU - Both Eyes       Right Eye Progression has no prior data. Macula : geographic atrophy, retinal pigment epithelium abnormalities. Vessels : attenuated. Periphery : RPE abnormality, tear.   Left Eye Progression has no prior data. Macula : geographic atrophy. Vessels : attenuated.   Notes **Images stored on drive**  Impression: OD: central chorioretinal atrophy and patches of pigment clumping; bullous retinoschisis nasal periphery with good laser surrounding OS: central and peripapillary chorioretinal atrophy w/ patches of pigment clumping                ASSESSMENT/PLAN:    ICD-10-CM   1. Right retinoschisis  H33.101 Color Fundus Photography Optos - OU - Both Eyes  2. Retinal detachment, right  H33.21   3. Retinal edema  H35.81 OCT, Retina - OU - Both Eyes  4. Central areolar choroidal dystrophy  H31.22 Color Fundus Photography Optos - OU - Both Eyes  5. Choroidal atrophy of both eyes  H31.103 Color Fundus Photography Optos - OU - Both Eyes  6. Advanced atrophic nonexudative age-related macular degeneration of both eyes with subfoveal involvement  H35.3134   7. Pseudophakia of both eyes  Z96.1   8. Strabismus  H50.9     1-3. Localized, focal retinal detachment / SRF surrounding small retinal tear at 0330, within retinoschisis cavity, RIGHT EYE  - asymptomatic, but found on exam 5.23.19  - tear located at 0330 OD  - S/P laser retinopexy OD (05.23.19), S/P touch up laser retinopexy OD (06.28.19)  - BCVA: 20/40   - good laser in place  - no change in SRF/schisis-detachment  - f/u in 6 months -- repeat widefield OCT over 0300 schisis cavity  4-6. Severe outer  retinal and choroidal atrophy OU - stable today  - likely  central areolar choroidal dystrophy -- pt notes declining vision that started in her 76-50s  - pt reports intermittent photopsias -- suspect may be related to progressive degeneration / dystrophy -- stably improved today  - differential also includes age related macular degeneration, non-exudative, both eyes  - The incidence, anatomy, and pathology of dry AMD, risk of progression, and the AREDS and AREDS 2 study including smoking risks discussed with patient.  - continue amsler grid monitoring  7. Pseudophakia OU  - s/p CE/IOL OU Gershon Crane, June 2020)  - beautiful surgeries, doing well  - monitor  8. Strabismus  - under the expert management of Dr. Frederico Hamman  - s/p EOM surgery 04/10/15  - monitor  Ophthalmic Meds Ordered this visit:  No orders of the defined types were placed in this encounter.      Return in about 6 months (around 01/20/2020) for f/u retinoschisis OD, DFE, OCT.  There are no Patient Instructions on file for this visit.   Explained the diagnoses, plan, and follow up with the patient and they expressed understanding.  Patient expressed understanding of the importance of proper follow up care.   This document serves as a record of services personally performed by Gardiner Sleeper, MD, PhD. It was created on their behalf by Ernest Mallick, OA, an ophthalmic assistant. The creation of this record is the provider's dictation and/or activities during the visit.    Electronically signed by: Ernest Mallick, OA  08.19.2020 8:22 PM    Gardiner Sleeper, M.D., Ph.D. Diseases & Surgery of the Retina and Vitreous [facility]  I have reviewed the above documentation for accuracy and completeness, and I agree with the above. Gardiner Sleeper, M.D., Ph.D. 07/20/19 8:22 PM   Abbreviations: M myopia (nearsighted); A astigmatism; H hyperopia (farsighted); P presbyopia; Mrx spectacle prescription;  CTL contact lenses; OD right eye; OS left eye; OU both eyes  XT  exotropia; ET esotropia; PEK punctate epithelial keratitis; PEE punctate epithelial erosions; DES dry eye syndrome; MGD meibomian gland dysfunction; ATs artificial tears; PFAT's preservative free artificial tears; [place] nuclear sclerotic cataract; PSC posterior subcapsular cataract; ERM epi-retinal membrane; PVD posterior vitreous detachment; RD retinal detachment; DM diabetes mellitus; DR diabetic retinopathy; NPDR non-proliferative diabetic retinopathy; PDR proliferative diabetic retinopathy; CSME clinically significant macular edema; DME diabetic macular edema; dbh dot blot hemorrhages; CWS cotton wool spot; POAG primary open angle glaucoma; C/D cup-to-disc ratio; HVF humphrey visual field; GVF goldmann visual field; OCT optical coherence tomography; IOP intraocular pressure; BRVO Branch retinal vein occlusion; CRVO central retinal vein occlusion; CRAO central retinal artery occlusion; BRAO branch retinal artery occlusion; RT retinal tear; SB scleral buckle; PPV pars plana vitrectomy; VH Vitreous hemorrhage; PRP panretinal laser photocoagulation; IVK intravitreal kenalog; VMT vitreomacular traction; MH Macular hole;  NVD neovascularization of the disc; NVE neovascularization elsewhere; AREDS age related eye disease study; ARMD age related macular degeneration; POAG primary open angle glaucoma; EBMD epithelial/anterior basement membrane dystrophy; ACIOL anterior chamber intraocular lens; IOL intraocular lens; PCIOL posterior chamber intraocular lens; Phaco/IOL phacoemulsification with intraocular lens placement; [place] photorefractive keratectomy; LASIK laser assisted in situ keratomileusis; HTN hypertension; DM diabetes mellitus; COPD chronic obstructive pulmonary disease

## 2019-07-20 ENCOUNTER — Encounter (INDEPENDENT_AMBULATORY_CARE_PROVIDER_SITE_OTHER): Payer: Self-pay | Admitting: Ophthalmology

## 2019-07-20 ENCOUNTER — Other Ambulatory Visit: Payer: Self-pay

## 2019-07-20 ENCOUNTER — Ambulatory Visit (INDEPENDENT_AMBULATORY_CARE_PROVIDER_SITE_OTHER): Payer: Medicare Other | Admitting: Ophthalmology

## 2019-07-20 DIAGNOSIS — H353134 Nonexudative age-related macular degeneration, bilateral, advanced atrophic with subfoveal involvement: Secondary | ICD-10-CM

## 2019-07-20 DIAGNOSIS — H33101 Unspecified retinoschisis, right eye: Secondary | ICD-10-CM

## 2019-07-20 DIAGNOSIS — H3122 Choroidal dystrophy (central areolar) (generalized) (peripapillary): Secondary | ICD-10-CM | POA: Diagnosis not present

## 2019-07-20 DIAGNOSIS — H3321 Serous retinal detachment, right eye: Secondary | ICD-10-CM

## 2019-07-20 DIAGNOSIS — H31103 Choroidal degeneration, unspecified, bilateral: Secondary | ICD-10-CM | POA: Diagnosis not present

## 2019-07-20 DIAGNOSIS — H3581 Retinal edema: Secondary | ICD-10-CM

## 2019-07-20 DIAGNOSIS — H509 Unspecified strabismus: Secondary | ICD-10-CM

## 2019-07-20 DIAGNOSIS — Z961 Presence of intraocular lens: Secondary | ICD-10-CM

## 2019-07-21 DIAGNOSIS — M25562 Pain in left knee: Secondary | ICD-10-CM | POA: Diagnosis not present

## 2019-07-24 DIAGNOSIS — M25562 Pain in left knee: Secondary | ICD-10-CM | POA: Diagnosis not present

## 2019-07-26 ENCOUNTER — Other Ambulatory Visit: Payer: Self-pay | Admitting: Family Medicine

## 2019-07-26 DIAGNOSIS — N318 Other neuromuscular dysfunction of bladder: Secondary | ICD-10-CM

## 2019-07-26 DIAGNOSIS — M25562 Pain in left knee: Secondary | ICD-10-CM | POA: Diagnosis not present

## 2019-07-26 NOTE — Telephone Encounter (Signed)
[facility] is requesting to fill pt meloxicam 7.5. we do not have this on file please advise. [place]

## 2019-07-31 DIAGNOSIS — M25562 Pain in left knee: Secondary | ICD-10-CM | POA: Diagnosis not present

## 2019-08-02 DIAGNOSIS — M25562 Pain in left knee: Secondary | ICD-10-CM | POA: Diagnosis not present

## 2019-08-09 DIAGNOSIS — M25562 Pain in left knee: Secondary | ICD-10-CM | POA: Diagnosis not present

## 2019-08-10 DIAGNOSIS — Z96652 Presence of left artificial knee joint: Secondary | ICD-10-CM | POA: Diagnosis not present

## 2019-08-10 DIAGNOSIS — Z471 Aftercare following joint replacement surgery: Secondary | ICD-10-CM | POA: Diagnosis not present

## 2019-08-21 DIAGNOSIS — M25562 Pain in left knee: Secondary | ICD-10-CM | POA: Diagnosis not present

## 2019-08-23 DIAGNOSIS — M25562 Pain in left knee: Secondary | ICD-10-CM | POA: Diagnosis not present

## 2019-08-28 DIAGNOSIS — M25562 Pain in left knee: Secondary | ICD-10-CM | POA: Diagnosis not present

## 2019-08-30 DIAGNOSIS — M25562 Pain in left knee: Secondary | ICD-10-CM | POA: Diagnosis not present

## 2019-08-31 ENCOUNTER — Other Ambulatory Visit: Payer: Self-pay

## 2019-08-31 ENCOUNTER — Encounter: Payer: Self-pay | Admitting: Family Medicine

## 2019-08-31 ENCOUNTER — Ambulatory Visit (INDEPENDENT_AMBULATORY_CARE_PROVIDER_SITE_OTHER): Payer: Medicare Other | Admitting: Family Medicine

## 2019-08-31 VITALS — BP 130/82 | HR 83 | Temp 96.8°F | Ht 64.0 in | Wt 186.2 lb

## 2019-08-31 DIAGNOSIS — M797 Fibromyalgia: Secondary | ICD-10-CM | POA: Diagnosis not present

## 2019-08-31 DIAGNOSIS — E6609 Other obesity due to excess calories: Secondary | ICD-10-CM

## 2019-08-31 DIAGNOSIS — H3530 Unspecified macular degeneration: Secondary | ICD-10-CM

## 2019-08-31 DIAGNOSIS — Z96652 Presence of left artificial knee joint: Secondary | ICD-10-CM

## 2019-08-31 DIAGNOSIS — J301 Allergic rhinitis due to pollen: Secondary | ICD-10-CM | POA: Diagnosis not present

## 2019-08-31 DIAGNOSIS — Z0000 Encounter for general adult medical examination without abnormal findings: Secondary | ICD-10-CM | POA: Diagnosis not present

## 2019-08-31 DIAGNOSIS — Z23 Encounter for immunization: Secondary | ICD-10-CM

## 2019-08-31 DIAGNOSIS — H5010 Unspecified exotropia: Secondary | ICD-10-CM

## 2019-08-31 DIAGNOSIS — D869 Sarcoidosis, unspecified: Secondary | ICD-10-CM

## 2019-08-31 DIAGNOSIS — M1990 Unspecified osteoarthritis, unspecified site: Secondary | ICD-10-CM | POA: Diagnosis not present

## 2019-08-31 DIAGNOSIS — I10 Essential (primary) hypertension: Secondary | ICD-10-CM

## 2019-08-31 MED ORDER — OLMESARTAN-AMLODIPINE-HCTZ 40-10-12.5 MG PO TABS: 1.0000 | ORAL_TABLET | Freq: Every day | ORAL | 3 refills | Status: DC

## 2019-08-31 NOTE — Patient Instructions (Signed)
  Kathy [name] , Thank you for taking time to come for your Medicare Wellness Visit. I appreciate your ongoing commitment to your health goals. Please review the following plan we discussed and let me know if I can assist you in the future.   These are the goals we discussed: 20 minutes of something physical daily or 150 minutes a week of something physical.  In terms of diet he uses things to cut back on carbohydrates and the easiest way to remember that his white food.  Bread, rice, pasta, potatoes, sugar This is a list of the screening recommended for you and due dates:  Health Maintenance  Topic Date Due  . Flu Shot  07/01/2019  . Pneumonia vaccines (2 of 2 - PPSV23) 02/02/2021  . Mammogram  02/07/2021  . Colon Cancer Screening  12/14/2021  . Tetanus Vaccine  02/02/2026  . DEXA scan (bone density measurement)  Completed  .  Hepatitis C: One time screening is recommended by Center for Disease Control  (CDC) for  adults born from [birth year] through 1965.   Completed

## 2019-08-31 NOTE — Progress Notes (Signed)
Kathy [name] is a 68 y.o. female who presents for annual wellness visit ,CPEand follow-up on chronic medical conditions.  Has underlying fibromyalgia but seems to be having very little difficulty with that.  She also has a previous history of sarcoidosis that is essentially in remission.  She follows up regularly with her ophthalmologist concerning macular degeneration.  Does have a history of high blood pressure and is doing well on her present medication regimen.  Her allergies seem to be under good control.  Since she had her knee replacement she is having much less difficulty with arthritic pain.  She continues in a rehab program.  She is starting to walk more.  Hopefully this will help her with her weight reduction. Immunizations and Health Maintenance Immunization History  Administered Date(s) Administered  . Fluad Quad(high Dose 65+) 08/31/2019  . Influenza Split 08/28/2011, 09/23/2012  . Influenza, High Dose Seasonal PF 09/30/2016, 09/01/2017, 08/24/2018  . Influenza,inj,Quad PF,6+ Mos 09/13/2013, 08/30/2014, 09/10/2015  . Pneumococcal Conjugate-13 08/30/2014  . Pneumococcal Polysaccharide-23 02/03/2016  . Tdap 02/03/2016  . Zoster 01/28/2012  . Zoster Recombinat (Shingrix) 09/16/2017   Health Maintenance Due  Topic Date Due  . INFLUENZA VACCINE  07/01/2019    Last Pap smear: Aged out  Last mammogram: 02/08/19 Last colonoscopy:12/24/16 Last DEXA: 10/20/18 Dentist: 10/19 Ophtho: 10/20 Exercise: Pt as well as walking.  Other doctors caring for patient include: Dr.Olin ortho, Dr. Coralyn Pear retina spec,  Advanced directives: Not file  Does Patient Have a Medical Advance Directive?: No Would patient like information on creating a medical advance directive?: Yes (MAU/Ambulatory/Procedural Areas - Information given)  Depression screen:  See questionnaire below.  Depression screen [facility] 2/9 08/31/2019 08/24/2018 04/13/2017 02/03/2016  Decreased Interest 0 0 0 0  Down, Depressed,  Hopeless 0 0 0 0  PHQ - 2 Score 0 0 0 0    Fall Risk Screen: see questionnaire below. Fall Risk  08/31/2019 04/13/2017  Falls in the past year? 0 No    ADL screen:  See questionnaire below Functional Status Survey: Is the patient deaf or have difficulty hearing?: No Does the patient have difficulty seeing, even when wearing glasses/contacts?: Yes(sometimes) Does the patient have difficulty concentrating, remembering, or making decisions?: No Does the patient have difficulty walking or climbing stairs?: Yes(Knee surgery) Does the patient have difficulty dressing or bathing?: No Does the patient have difficulty doing errands alone such as visiting a doctor's office or shopping?: No   Review of Systems Constitutional: -, -unexpected weight change, -anorexia, -fatigue Allergy: -sneezing, -itching, -congestion Dermatology: denies changing moles, rash, lumps ENT: -runny nose, -ear pain, -sore throat,  Cardiology:  -chest pain, -palpitations, -orthopnea, Respiratory: -cough, -shortness of breath, -dyspnea on exertion, -wheezing,  Gastroenterology: -abdominal pain, -nausea, -vomiting, -diarrhea, -constipation, -dysphagia Hematology: -bleeding or bruising problems Musculoskeletal: -arthralgias, -myalgias, -joint swelling, -back pain, - Ophthalmology: -vision changes,  Urology: -dysuria, -difficulty urinating,  -urinary frequency, -urgency, incontinence Neurology: -, -numbness, , -memory loss, -falls, -dizziness    PHYSICAL EXAM:  BP 130/82 (BP Location: Left Arm, Patient Position: Sitting)   Pulse 83   Temp (!) 96.8 F (36 C)   Ht 5\' 4"  (1.626 m)   Wt 186 lb 3.2 oz (84.5 kg)   SpO2 98%   BMI 31.96 kg/m   General Appearance: Alert, cooperative, no distress, appears stated age Head: Normocephalic, without obvious abnormality, atraumatic Eyes: PERRL, conjunctiva/corneas clear, EOM's intact, fundi benign Ears: Normal TM's and external ear canals Nose: Nares normal, mucosa normal,  no drainage or  sinus tenderness Throat: Lips, mucosa, and tongue normal; teeth and gums normal Neck: Supple, no lymphadenopathy;  thyroid:  no enlargement/tenderness/nodules; no carotid bruit or JVD Lungs: Clear to auscultation bilaterally without wheezes, rales or ronchi; respirations unlabored Heart: Regular rate and rhythm, S1 and S2 normal, no murmur, rubor gallop Abdomen: Soft, non-tender, nondistended, normoactive bowel sounds,  no masses, no hepatosplenomegaly Extremities: No clubbing, cyanosis or edema Pulses: 2+ and symmetric all extremities Skin:  Skin color, texture, turgor normal, no rashes or lesions Lymph nodes: Cervical, supraclavicular, and axillary nodes normal Neurologic:  CNII-XII intact, normal strength, sensation and gait; reflexes 2+ and symmetric throughout Psych: Normal mood, affect, hygiene and grooming.  ASSESSMENT/PLAN: Need for influenza vaccination - Plan: Flu Vaccine QUAD High Dose(Fluad)  Macular degeneration, unspecified laterality, unspecified type::  Fibromyalgia: No further intervention needed  Arthritis: Continue supportive care  Allergic rhinitis due to pollen, unspecified seasonality: Continue to treat symptomatically Exotropia  Essential hypertension - Plan: Olmesartan-amLODIPine-HCTZ 40-10-12.5 MG TABS  S/P left TKA: Went over exercises to do including full extension and flexion of the knee as well as working on scar revision. Sarcoidosis: No intervention.  Class 2 obesity due to excess calories without serious comorbidity in adult, unspecified BMI: Encouraged her to increase her physical activity by walking.  Explained that she does not need to wait for her husband to follow-up with her since he seems to be on interested in working with her in this regard.   at least 30 minutes of aerobic activity at least 5 days/week and weight-bearing exercise 2x/week;   Immunization recommendations discussed.  Recommend she get her second Shingrix shot.   Colonoscopy recommendations reviewed   Medicare Attestation I have personally reviewed: The patient's medical and social history Their use of alcohol, tobacco or illicit drugs Their current medications and supplements The patient's functional ability including ADLs,fall risks, home safety risks, cognitive, and hearing and visual impairment Diet and physical activities Evidence for depression or mood disorders  The patient's weight, height, and BMI have been recorded in the chart.  I have made referrals, counseling, and provided education to the patient based on review of the above and I have provided the patient with a written personalized care plan for preventive services.     Jill Alexanders, MD   08/31/2019

## 2019-09-01 DIAGNOSIS — H3122 Choroidal dystrophy (central areolar) (generalized) (peripapillary): Secondary | ICD-10-CM | POA: Diagnosis not present

## 2019-09-01 DIAGNOSIS — Z961 Presence of intraocular lens: Secondary | ICD-10-CM | POA: Diagnosis not present

## 2019-09-04 DIAGNOSIS — M25562 Pain in left knee: Secondary | ICD-10-CM | POA: Diagnosis not present

## 2019-09-06 DIAGNOSIS — M25562 Pain in left knee: Secondary | ICD-10-CM | POA: Diagnosis not present

## 2019-09-15 ENCOUNTER — Other Ambulatory Visit: Payer: Self-pay | Admitting: Podiatry

## 2019-09-15 ENCOUNTER — Ambulatory Visit: Payer: Medicare Other | Admitting: Podiatry

## 2019-09-15 ENCOUNTER — Ambulatory Visit (INDEPENDENT_AMBULATORY_CARE_PROVIDER_SITE_OTHER): Payer: Medicare Other

## 2019-09-15 ENCOUNTER — Other Ambulatory Visit: Payer: Self-pay

## 2019-09-15 DIAGNOSIS — M722 Plantar fascial fibromatosis: Secondary | ICD-10-CM | POA: Diagnosis not present

## 2019-09-15 DIAGNOSIS — M1990 Unspecified osteoarthritis, unspecified site: Secondary | ICD-10-CM

## 2019-09-15 DIAGNOSIS — M19071 Primary osteoarthritis, right ankle and foot: Secondary | ICD-10-CM

## 2019-09-15 DIAGNOSIS — M79672 Pain in left foot: Secondary | ICD-10-CM

## 2019-09-15 DIAGNOSIS — M79671 Pain in right foot: Secondary | ICD-10-CM | POA: Diagnosis not present

## 2019-09-17 ENCOUNTER — Encounter: Payer: Self-pay | Admitting: Podiatry

## 2019-09-17 NOTE — Progress Notes (Signed)
Subjective:  Patient ID: Kathy [name], female    DOB: [birth year],  MRN: [id number]  Chief Complaint  Patient presents with  . Foot Pain    pt has left bottom of heel pain, going on for about 4 months right after her knee replacement surgery, pt also has pain on the medial side of her right ankle as well as the bottom of the right heel up to the bottom of the right toe, pt states that the pain is more of a burning sensation    68 y.o. female presents with the above complaint.  She states that she has continuous left heel pain especially while she has been walking especially after her knee replacement.  She was treated previously for left-year-old plantar fasciitis and since then has been resolved.  However after the knee replacement due to gait abnormality the plantar fasciitis has returned.  Patient states has tried soaking but has not helped much.  The pain is worse when applying pressure.  She also has secondary complaints of right ankle and first MPJ pain.  That has been going on for about a year and is flared back up.  She states it is more of a dull achy pain within the joint.  And it it is worse when she is ambulating.  She denies any other acute complaints.   Review of Systems: Negative except as noted in the HPI. Denies N/V/F/Ch.  Past Medical History:  Diagnosis Date  . Exotropia of left eye   . Fibromyalgia   . History of uterine leiomyoma   . Hypertension   . Macular degeneration   . OA (osteoarthritis)   . Overactive bladder   . Sarcoidosis   . Wears glasses     Current Outpatient Medications:  .  acetaminophen (TYLENOL) 500 MG tablet, Take 2 tablets (1,000 mg total) by mouth every 8 (eight) hours., Disp: 30 tablet, Rfl: 0 .  cycloSPORINE (RESTASIS) 0.05 % ophthalmic emulsion, Place 1 drop into both eyes 2 (two) times daily., Disp: , Rfl:  .  docusate sodium (COLACE) 100 MG capsule, Take 1 capsule (100 mg total) by mouth 2 (two) times daily., Disp: 28 capsule, Rfl: 0  .  HYDROmorphone (DILAUDID) 2 MG tablet, TAKE 1 2 BY MOUTH EVERY 4 HOURS AS NEEDED FOR PAIN**INS MAX 8 TABS/DAY, Disp: , Rfl:  .  meloxicam (MOBIC) 7.5 MG tablet, TAKE 1 TABLET EVERY DAY, Disp: 90 tablet, Rfl: 0 .  methocarbamol (ROBAXIN) 500 MG tablet, Take 1 tablet (500 mg total) by mouth every 6 (six) hours as needed for muscle spasms., Disp: 40 tablet, Rfl: 0 .  Multiple Vitamins-Minerals (PRESERVISION AREDS 2) CAPS, Take 1 capsule by mouth 2 (two) times a day., Disp: , Rfl:  .  ofloxacin (OCUFLOX) 0.3 % ophthalmic solution, 1 DROP 3 TIMES A DAY IN OPERATIVE EYE START 2 DAYS PRIOR TO SURGERY AND AFTER SURGERY X3 WEEKS., Disp: , Rfl:  .  Olmesartan-amLODIPine-HCTZ 40-10-12.5 MG TABS, Take 1 tablet by mouth daily., Disp: 90 tablet, Rfl: 3 .  polyethylene glycol (MIRALAX / GLYCOLAX) 17 g packet, Take 17 g by mouth 2 (two) times daily., Disp: 28 packet, Rfl: 0 .  prednisoLONE acetate (PRED FORTE) 1 % ophthalmic suspension, PLEASE SEE ATTACHED FOR DETAILED DIRECTIONS, Disp: , Rfl:  .  ferrous sulfate (FERROUSUL) 325 (65 FE) MG tablet, Take 1 tablet (325 mg total) by mouth 3 (three) times daily with meals for 14 days. (Patient not taking: Reported on 08/31/2019), Disp: 42 tablet, Rfl: 0  Social History   Tobacco Use  Smoking Status Never Smoker  Smokeless Tobacco Never Used    Allergies  Allergen Reactions  . Lortab [Hydrocodone-Acetaminophen] Nausea And Vomiting  . Latex Other (See Comments)    Pt unsure of reaction!   Objective:  There were no vitals filed for this visit. There is no height or weight on file to calculate BMI. Constitutional Well developed. Well nourished.  Vascular Dorsalis pedis pulses palpable bilaterally. Posterior tibial pulses palpable bilaterally. Capillary refill normal to all digits.  No cyanosis or clubbing noted. Pedal hair growth normal.  Neurologic Normal speech. Oriented to person, place, and time. Epicritic sensation to light touch grossly present  bilaterally.  Dermatologic Nails well groomed and normal in appearance. No open wounds. No skin lesions.  Orthopedic: Normal joint ROM without pain or crepitus bilaterally. No visible deformities. Tender to palpation at the calcaneal tuber left No pain with calcaneal squeeze left Ankle ROM diminished range of motion left. Silfverskiold Test: positive left.   Radiographs: Taken and reviewed. No acute fractures or dislocations. No evidence of stress fracture.  Plantar heel spur present. Posterior heel spur absent.  Left first MPJ arthritis noted severe in nature.  Right ankle arthritis and right first MPJ arthritis noted severe nature.  Assessment:   1. Foot pain, left    Plan:  Patient was evaluated and treated and all questions answered.  Plantar Fasciitis, left - XR reviewed as above.  - Educated on icing and stretching. Instructions given.  - Injection delivered to the plantar fascia as below. - DME: Plantar Fascial Brace L Side - Pharmacologic management: Educated on risks/benefits and proper taking of medication. -Patient will benefit from custom-made orthotics for bilateral feet.   Right foot ankle and first MPJ arthritis (severe) -A steroid injection was performed at right ankle lateral gutter and first MPJ using 1% plain Lidocaine and 10 mg of Kenalog. This was well tolerated. -Patient felt immediate relief after injection. -I explained to the patient the etiology and all the various treatment options including all the conservative options for right foot and ankle and first MPJ arthritic changes.  Procedure: Injection Tendon/Ligament Location: Left plantar fascia at the glabrous junction; medial approach. Skin Prep: alcohol Injectate: 0.5 cc 0.5% marcaine plain, 0.5 cc of 1% Lidocaine, 0.5 cc kenalog 10. Disposition: Patient tolerated procedure well. Injection site dressed with a band-aid.  No follow-ups on file.

## 2019-09-18 ENCOUNTER — Other Ambulatory Visit: Payer: Self-pay | Admitting: Podiatry

## 2019-09-18 DIAGNOSIS — M1990 Unspecified osteoarthritis, unspecified site: Secondary | ICD-10-CM

## 2019-09-21 DIAGNOSIS — Z961 Presence of intraocular lens: Secondary | ICD-10-CM | POA: Diagnosis not present

## 2019-10-20 ENCOUNTER — Ambulatory Visit: Payer: Medicare Other | Admitting: Podiatry

## 2019-10-20 ENCOUNTER — Other Ambulatory Visit: Payer: Self-pay

## 2019-10-20 DIAGNOSIS — M79671 Pain in right foot: Secondary | ICD-10-CM

## 2019-10-20 DIAGNOSIS — M19071 Primary osteoarthritis, right ankle and foot: Secondary | ICD-10-CM

## 2019-10-20 DIAGNOSIS — M21619 Bunion of unspecified foot: Secondary | ICD-10-CM

## 2019-10-20 MED ORDER — DICLOFENAC SODIUM 1 % EX GEL: 2.0000 g | Freq: Four times a day (QID) | CUTANEOUS | 0 refills | Status: DC

## 2019-10-23 ENCOUNTER — Encounter: Payer: Self-pay | Admitting: Podiatry

## 2019-10-23 NOTE — Progress Notes (Signed)
Subjective:  Patient ID: Kathy [name], female    DOB: [birth year],  MRN: [id number]  Chief Complaint  Patient presents with  . Foot Pain    pt is here for a f/u of the left bottom of heel pain, pt is also here for right ankle pain, pt states that her left heel is doing a lot better since the last time she was here, but her right ankle is not getting much better    68 y.o. female presents with the above complaint.  Patient is here on follow-up from left heel pain as well as injections to the right first metatarsophalangeal joint and right ankle.  Patient states the left heel pain has completely resolved she does not have any more pain.  She does states that her right ankle and first metatarsal joint helped about 50% however has not completely resolve the pain.  Patient states plantar fascial braces have also been helping a lot.  She denies any other acute complaints.  She would like to get another injection to help 100% of her pain go away.  She denies any other acute complaints.   Review of Systems: Negative except as noted in the HPI. Denies N/V/F/Ch.  Past Medical History:  Diagnosis Date  . Exotropia of left eye   . Fibromyalgia   . History of uterine leiomyoma   . Hypertension   . Macular degeneration   . OA (osteoarthritis)   . Overactive bladder   . Sarcoidosis   . Wears glasses     Current Outpatient Medications:  .  acetaminophen (TYLENOL) 500 MG tablet, Take 2 tablets (1,000 mg total) by mouth every 8 (eight) hours., Disp: 30 tablet, Rfl: 0 .  cycloSPORINE (RESTASIS) 0.05 % ophthalmic emulsion, Place 1 drop into both eyes 2 (two) times daily., Disp: , Rfl:  .  docusate sodium (COLACE) 100 MG capsule, Take 1 capsule (100 mg total) by mouth 2 (two) times daily., Disp: 28 capsule, Rfl: 0 .  HYDROmorphone (DILAUDID) 2 MG tablet, TAKE 1 2 BY MOUTH EVERY 4 HOURS AS NEEDED FOR PAIN**INS MAX 8 TABS/DAY, Disp: , Rfl:  .  meloxicam (MOBIC) 7.5 MG tablet, TAKE 1 TABLET EVERY DAY,  Disp: 90 tablet, Rfl: 0 .  methocarbamol (ROBAXIN) 500 MG tablet, Take 1 tablet (500 mg total) by mouth every 6 (six) hours as needed for muscle spasms., Disp: 40 tablet, Rfl: 0 .  Multiple Vitamins-Minerals (PRESERVISION AREDS 2) CAPS, Take 1 capsule by mouth 2 (two) times a day., Disp: , Rfl:  .  ofloxacin (OCUFLOX) 0.3 % ophthalmic solution, 1 DROP 3 TIMES A DAY IN OPERATIVE EYE START 2 DAYS PRIOR TO SURGERY AND AFTER SURGERY X3 WEEKS., Disp: , Rfl:  .  Olmesartan-amLODIPine-HCTZ 40-10-12.5 MG TABS, Take 1 tablet by mouth daily., Disp: 90 tablet, Rfl: 3 .  polyethylene glycol (MIRALAX / GLYCOLAX) 17 g packet, Take 17 g by mouth 2 (two) times daily., Disp: 28 packet, Rfl: 0 .  prednisoLONE acetate (PRED FORTE) 1 % ophthalmic suspension, PLEASE SEE ATTACHED FOR DETAILED DIRECTIONS, Disp: , Rfl:  .  diclofenac Sodium (VOLTAREN) 1 % GEL, Apply 2 g topically 4 (four) times daily., Disp: 100 g, Rfl: 0 .  ferrous sulfate (FERROUSUL) 325 (65 FE) MG tablet, Take 1 tablet (325 mg total) by mouth 3 (three) times daily with meals for 14 days. (Patient not taking: Reported on 08/31/2019), Disp: 42 tablet, Rfl: 0  Social History   Tobacco Use  Smoking Status Never Smoker  Smokeless Tobacco  Never Used    Allergies  Allergen Reactions  . Lortab [Hydrocodone-Acetaminophen] Nausea And Vomiting  . Latex Other (See Comments)    Pt unsure of reaction!   Objective:  There were no vitals filed for this visit. There is no height or weight on file to calculate BMI. Constitutional Well developed. Well nourished.  Vascular Dorsalis pedis pulses palpable bilaterally. Posterior tibial pulses palpable bilaterally. Capillary refill normal to all digits.  No cyanosis or clubbing noted. Pedal hair growth normal.  Neurologic Normal speech. Oriented to person, place, and time. Epicritic sensation to light touch grossly present bilaterally.  Dermatologic Nails well groomed and normal in appearance. No open  wounds. No skin lesions.  Orthopedic: Normal joint ROM without pain or crepitus bilaterally. No visible deformities. No pain on palpation to the left heel.  Pain with range of motion to the right first metatarsophalangeal joint.  Pain with crepitus noted to the right first MPJ.  Pain with range of motion of the ankle joint active and passive.  No crepitus was felt at the ankle joint.   Radiographs: None  Assessment:   1. Arthritis of right ankle   2. Arthritis of right foot   3. Bunion   4. Pain in right foot    Plan:  Patient was evaluated and treated and all questions answered.  Plantar Fasciitis, left -Resolved  Right ankle and first metatarsophalangeal joint arthritis -I explained to the patient the etiology and various treatment options associated with arthritis.  I explained to her that another steroid injection can help her get to possible 100%.  Patient elected to try another injection at this time. -A steroid injection was performed at right first MPJ and right ankle joint using 1% plain Lidocaine and 10 mg of Kenalog. This was well tolerated. -I explained to the patient that if this does not help we will have to consider surgical interventions during next visit.  I will discuss surgical options during next visit if this does not resolve.   Return in about 6 weeks (around 12/01/2019).

## 2019-11-14 ENCOUNTER — Other Ambulatory Visit: Payer: Self-pay | Admitting: Family Medicine

## 2019-11-14 ENCOUNTER — Other Ambulatory Visit: Payer: Self-pay | Admitting: Podiatry

## 2019-11-14 DIAGNOSIS — I10 Essential (primary) hypertension: Secondary | ICD-10-CM

## 2019-12-06 ENCOUNTER — Ambulatory Visit: Payer: Medicare Other | Admitting: Podiatry

## 2020-01-08 NOTE — Progress Notes (Signed)
[facility] Note  01/22/2020     CHIEF COMPLAINT Patient presents for Retina Follow Up   HISTORY OF PRESENT ILLNESS: Kathy [name] is a 69 y.o. female who presents to the clinic today for:    HPI    Retina Follow Up    Patient presents with  Other.  In right eye.  Severity is mild.  Since onset it is stable.  I, the attending physician,  performed the HPI with the patient and updated documentation appropriately.          Comments    6 mos f/u retinoschisis OD. Patient states her vision has been "good", denies new visual onsets. Pt is using Restasis BID OU.        Last edited by Bernarda Caffey, MD on 01/22/2020  9:50 AM. (History)    pt states no change in vision   Referring physician: Denita Lung, MD [facility],  [place] [id number]  HISTORICAL INFORMATION:   Selected notes from the MEDICAL RECORD NUMBER Referred by Dr. Gevena Cotton for concern of central areolar choroidal dystrophy LEE: 05.16.19 (M. Frederico Hamman) [BCVA: OD: 20/30-2 OS: CF] Ocular Hx-Central areolar choroidal dystrophy, Macular Degeneration, exotropia OS, cataracts OU, dry eyes PMH-HTN, Fibromyalgia     CURRENT MEDICATIONS: Current Outpatient Medications (Ophthalmic Drugs)  Medication Sig  . cycloSPORINE (RESTASIS) 0.05 % ophthalmic emulsion Place 1 drop into both eyes 2 (two) times daily.  Marland Kitchen ofloxacin (OCUFLOX) 0.3 % ophthalmic solution 1 DROP 3 TIMES A DAY IN OPERATIVE EYE START 2 DAYS PRIOR TO SURGERY AND AFTER SURGERY X3 WEEKS.  . prednisoLONE acetate (PRED FORTE) 1 % ophthalmic suspension PLEASE SEE ATTACHED FOR DETAILED DIRECTIONS   No current facility-administered medications for this visit. (Ophthalmic Drugs)   Current Outpatient Medications (Other)  Medication Sig  . acetaminophen (TYLENOL) 500 MG tablet Take 2 tablets (1,000 mg total) by mouth every 8 (eight) hours.  . diclofenac Sodium (VOLTAREN) 1 % GEL APPLY 2 GRAMS TO AFFECTED AREA 4  TIMES A DAY  . docusate sodium (COLACE) 100 MG capsule Take 1 capsule (100 mg total) by mouth 2 (two) times daily.  Marland Kitchen HYDROmorphone (DILAUDID) 2 MG tablet TAKE 1 2 BY MOUTH EVERY 4 HOURS AS NEEDED FOR PAIN**INS MAX 8 TABS/DAY  . meloxicam (MOBIC) 7.5 MG tablet TAKE 1 TABLET EVERY DAY  . methocarbamol (ROBAXIN) 500 MG tablet Take 1 tablet (500 mg total) by mouth every 6 (six) hours as needed for muscle spasms.  . Multiple Vitamins-Minerals (PRESERVISION AREDS 2) CAPS Take 1 capsule by mouth 2 (two) times a day.  . Olmesartan-amLODIPine-HCTZ 40-10-12.5 MG TABS TAKE 1 TABLET BY MOUTH EVERY DAY  . polyethylene glycol (MIRALAX / GLYCOLAX) 17 g packet Take 17 g by mouth 2 (two) times daily.  . ferrous sulfate (FERROUSUL) 325 (65 FE) MG tablet Take 1 tablet (325 mg total) by mouth 3 (three) times daily with meals for 14 days. (Patient not taking: Reported on 08/31/2019)   No current facility-administered medications for this visit. (Other)      REVIEW OF SYSTEMS: ROS    Positive for: Eyes   Negative for: Constitutional, Gastrointestinal, Neurological, Skin, Genitourinary, Musculoskeletal, HENT, Endocrine, Cardiovascular, Respiratory, Psychiatric, Allergic/Imm, Heme/Lymph   Last edited by Zenovia Jordan, LPN on [id number]  D34-534 AM. (History)       ALLERGIES Allergies  Allergen Reactions  . Lortab [Hydrocodone-Acetaminophen] Nausea And Vomiting  . Latex Other (See Comments)    Pt unsure of reaction!  PAST MEDICAL HISTORY Past Medical History:  Diagnosis Date  . Exotropia of left eye   . Fibromyalgia   . History of uterine leiomyoma   . Hypertension   . Macular degeneration   . OA (osteoarthritis)   . Overactive bladder   . Sarcoidosis   . Wears glasses    Past Surgical History:  Procedure Laterality Date  . ABDOMINAL HYSTERECTOMY  2013  . ADJUSTABLE SUTURE MANIPULATION Left 04/10/2015   Procedure: ADJUSTABLE SUTURE MANIPULATION;  Surgeon: Gevena Cotton, MD;  Location:  [facility];  Service: Ophthalmology;  Laterality: Left;  . EYE SURGERY    . MEDIAN RECTUS REPAIR Left 04/10/2015   Procedure: MEDIAN RECTUS RESECTION LATERAL RECTUS RECESSION,AJUSTABLES SUTURES LEFT EYE;  Surgeon: Gevena Cotton, MD;  Location: [facility];  Service: Ophthalmology;  Laterality: Left;  . TONSILLECTOMY  as child  . TOTAL ABDOMINAL HYSTERECTOMY W/ BILATERAL SALPINGOOPHORECTOMY  10-14-2010   and TVT midurethral sling and culdoplasty  . TOTAL KNEE ARTHROPLASTY Left 06/15/2019   Procedure: TOTAL KNEE ARTHROPLASTY;  Surgeon: Paralee Cancel, MD;  Location: [facility];  Service: Orthopedics;  Laterality: Left;  70 mins    FAMILY HISTORY Family History  Problem Relation Age of Onset  . Colon cancer Mother   . Diabetes Father   . Diabetes Sister   . Hypertension Sister   . Diabetes Sister   . Hypertension Sister   . Hypertension Sister     SOCIAL HISTORY Social History   Tobacco Use  . Smoking status: Never Smoker  . Smokeless tobacco: Never Used  Substance Use Topics  . Alcohol use: Yes    Alcohol/week: 1.0 standard drinks    Types: 1 Glasses of wine per week    Comment: rare  . Drug use: No         OPHTHALMIC EXAM:  Base Eye Exam    Visual Acuity (Snellen - Linear)      Right Left   Dist cc 20/40 -2 CF at 3'   Dist ph cc NI    Correction: Glasses       Tonometry (Tonopen, 9:03 AM)      Right Left   Pressure 12 18       Pupils      Dark Light Shape React APD   Right 3 2 Round Brisk None   Left 3 2 Round Brisk None       Visual Fields (Counting fingers)      Left Right     Full   Restrictions Partial outer inferior temporal, inferior nasal deficiencies        Extraocular Movement      Right Left    Full, Ortho Full, Ortho       Neuro/Psych    Oriented x3: Yes   Mood/Affect: Normal       Dilation    Both eyes: 1.0% Mydriacyl, 2.5% Phenylephrine @ 9:00 AM        Slit Lamp and Fundus Exam    Slit Lamp Exam       Right Left   Lids/Lashes Dermatochalasis - upper lid Dermatochalasis - upper lid   Conjunctiva/Sclera mild Melanosis mild Melanosis   Cornea Arcus, 1+ PEE, Well healed temporal cataract wounds, mild Debris in tear film Arcus, linear scar at 0430, Well healed temporal cataract wounds, 1+ Punctate epithelial erosions   Anterior Chamber Deep and quiet Deep and quiet   Iris Round and dilated, patches of atrophy at 1130 and 0900 Round and  dilated, mild patches of atrophy   Lens PC IOL in good position, mild pigment on optic PC IOL in good position, mild pigment on optic, 1-2+ Posterior capsular opacification   Vitreous Vitreous syneresis, Posterior vitreous detachment, vitreous condensations Vitreous syneresis       Fundus Exam      Right Left   Disc Sharp rim, mild pallor, mild PPP Tilted disc, mild pallor, sharp rim, almost 360 degrees of PPA   C/D Ratio 0.4 0.4   Macula large area of RPE and chorodial atrophy with small central foveal island spared; central island w/pigment clumping  large area of choroidal and retinal pigment epithelial atrophy extending to nasal side of disc -- stable from prior, Pigment clumping, No heme    Vessels Vascular attenuation Vascular attenuation   Periphery Attached,  vitreous condensations at 0300 with retinal break and +SRF / focal RD within bed of retinoschisis -- stable with good laser surrounding, ?schisis ST periphery Attached             IMAGING AND PROCEDURES  Imaging and Procedures for @TODAY @  OCT, Retina - OU - Both Eyes       Right Eye Quality was good. Central Foveal Thickness: 183. Progression has been stable. Findings include abnormal foveal contour, inner retinal atrophy, outer retinal atrophy, no SRF, no IRF, retinal drusen  (en face image stable).   Left Eye Quality was good. Central Foveal Thickness: 208. Progression has been stable. Findings include outer retinal atrophy, inner retinal atrophy, abnormal foveal contour, no IRF, no  SRF (Diffuse central atrophy; en face image stable).   Notes *Images captured and stored on drive  Diagnosis / Impression:  Stable from prior Diffuse atrophy OU OD with central island of non-atrophied retina -- ?mild interval progression of ORA Central areolar choroidal dystrophy OU Retinoschisis nasal periphery OD -- caught on widefield  Clinical management:  See below  Abbreviations: NFP - Normal foveal profile. CME - cystoid macular edema. PED - pigment epithelial detachment. IRF - intraretinal fluid. SRF - subretinal fluid. EZ - ellipsoid zone. ERM - epiretinal membrane. ORA - outer retinal atrophy. ORT - outer retinal tubulation. SRHM - subretinal hyper-reflective material                ASSESSMENT/PLAN:    ICD-10-CM   1. Right retinoschisis  H33.101   2. Retinal detachment, right  H33.21   3. Retinal edema  H35.81 OCT, Retina - OU - Both Eyes  4. 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 Choroidal atrophy of both eyes  H31.103   6. Advanced atrophic nonexudative age-related macular degeneration of both eyes with subfoveal involvement  H35.3134   7. Pseudophakia of both eyes  Z96.1   8. Strabismus  H50.9     1-3. Localized, focal retinal detachment / SRF surrounding small retinal tear at 0330, within retinoschisis cavity, RIGHT EYE  - asymptomatic, but found on exam 5.23.19  - tear located at 0330 OD  - S/P laser retinopexy OD (05.23.19), S/P touch up laser retinopexy OD (06.28.19)  - BCVA: 20/40   - good laser in place  - no change in SRF/schisis-detachment  - f/u in 6-9 months -- repeat widefield OCT over 0300 schisis cavity  4-6. Severe outer retinal and choroidal atrophy OU - stable today  - likely central areolar choroidal dystrophy -- pt notes declining vision that started in her 78-50s  - pt reports intermittent photopsias -- suspect may be related to progressive degeneration / dystrophy --  stably improved today  - differential also includes age  related macular degeneration, non-exudative, both eyes  - The incidence, anatomy, and pathology of dry AMD, risk of progression, and the AREDS and AREDS 2 study including smoking risks discussed with patient.  - continue amsler grid monitoring  7. Pseudophakia OU  - s/p CE/IOL OU Gershon Crane, June 2020)  - beautiful surgeries, doing well  - monitor  8. Strabismus  - under the expert management of Dr. Frederico Hamman  - s/p EOM surgery 04/10/15  - monitor  Ophthalmic Meds Ordered this visit:  No orders of the defined types were placed in this encounter.      Return for f/u 6-9 months, retinoschisis OD / atrophy OU, DFE, OCT.  There are no Patient Instructions on file for this visit.   Explained the diagnoses, plan, and follow up with the patient and they expressed understanding.  Patient expressed understanding of the importance of proper follow up care.   This document serves as a record of services personally performed by Gardiner Sleeper, MD, PhD. It was created on their behalf by Leeann Must, [place], a certified ophthalmic assistant. The creation of this record is the provider's dictation and/or activities during the visit.    Electronically signed by: Leeann Must, COA @TODAY @ 10:34 PM   This document serves as a record of services personally performed by Gardiner Sleeper, MD, PhD. It was created on their behalf by Ernest Mallick, OA, an ophthalmic assistant. The creation of this record is the provider's dictation and/or activities during the visit.    Electronically signed by: Ernest Mallick, OA 02.22.2021 10:34 PM   Gardiner Sleeper, M.D., Ph.D. Diseases & Surgery of the Retina and Vitreous [facility]  I have reviewed the above documentation for accuracy and completeness, and I agree with the above. Gardiner Sleeper, M.D., Ph.D. 01/22/20 10:34 PM    Abbreviations: M myopia (nearsighted); A astigmatism; H hyperopia (farsighted); P presbyopia; Mrx spectacle  prescription;  CTL contact lenses; OD right eye; OS left eye; OU both eyes  XT exotropia; ET esotropia; PEK punctate epithelial keratitis; PEE punctate epithelial erosions; DES dry eye syndrome; MGD meibomian gland dysfunction; ATs artificial tears; PFAT's preservative free artificial tears; [place] nuclear sclerotic cataract; PSC posterior subcapsular cataract; ERM epi-retinal membrane; PVD posterior vitreous detachment; RD retinal detachment; DM diabetes mellitus; DR diabetic retinopathy; NPDR non-proliferative diabetic retinopathy; PDR proliferative diabetic retinopathy; CSME clinically significant macular edema; DME diabetic macular edema; dbh dot blot hemorrhages; CWS cotton wool spot; POAG primary open angle glaucoma; C/D cup-to-disc ratio; HVF humphrey visual field; GVF goldmann visual field; OCT optical coherence tomography; IOP intraocular pressure; BRVO Branch retinal vein occlusion; CRVO central retinal vein occlusion; CRAO central retinal artery occlusion; BRAO branch retinal artery occlusion; RT retinal tear; SB scleral buckle; PPV pars plana vitrectomy; VH Vitreous hemorrhage; PRP panretinal laser photocoagulation; IVK intravitreal kenalog; VMT vitreomacular traction; MH Macular hole;  NVD neovascularization of the disc; NVE neovascularization elsewhere; AREDS age related eye disease study; ARMD age related macular degeneration; POAG primary open angle glaucoma; EBMD epithelial/anterior basement membrane dystrophy; ACIOL anterior chamber intraocular lens; IOL intraocular lens; PCIOL posterior chamber intraocular lens; Phaco/IOL phacoemulsification with intraocular lens placement; [place] photorefractive keratectomy; LASIK laser assisted in situ keratomileusis; HTN hypertension; DM diabetes mellitus; COPD chronic obstructive pulmonary disease

## 2020-01-22 ENCOUNTER — Encounter (INDEPENDENT_AMBULATORY_CARE_PROVIDER_SITE_OTHER): Payer: Self-pay | Admitting: Ophthalmology

## 2020-01-22 ENCOUNTER — Ambulatory Visit (INDEPENDENT_AMBULATORY_CARE_PROVIDER_SITE_OTHER): Payer: Medicare Other | Admitting: Ophthalmology

## 2020-01-22 DIAGNOSIS — H3321 Serous retinal detachment, right eye: Secondary | ICD-10-CM | POA: Diagnosis not present

## 2020-01-22 DIAGNOSIS — H509 Unspecified strabismus: Secondary | ICD-10-CM

## 2020-01-22 DIAGNOSIS — H31103 Choroidal degeneration, unspecified, bilateral: Secondary | ICD-10-CM | POA: Diagnosis not present

## 2020-01-22 DIAGNOSIS — H33101 Unspecified retinoschisis, right eye: Secondary | ICD-10-CM | POA: Diagnosis not present

## 2020-01-22 DIAGNOSIS — H3581 Retinal edema: Secondary | ICD-10-CM | POA: Diagnosis not present

## 2020-01-22 DIAGNOSIS — H353134 Nonexudative age-related macular degeneration, bilateral, advanced atrophic with subfoveal involvement: Secondary | ICD-10-CM

## 2020-01-22 DIAGNOSIS — Z961 Presence of intraocular lens: Secondary | ICD-10-CM

## 2020-01-22 DIAGNOSIS — H3122 Choroidal dystrophy (central areolar) (generalized) (peripapillary): Secondary | ICD-10-CM

## 2020-02-13 ENCOUNTER — Ambulatory Visit (INDEPENDENT_AMBULATORY_CARE_PROVIDER_SITE_OTHER): Payer: Medicare Other | Admitting: Family Medicine

## 2020-02-13 ENCOUNTER — Encounter: Payer: Self-pay | Admitting: Family Medicine

## 2020-02-13 ENCOUNTER — Other Ambulatory Visit: Payer: Self-pay

## 2020-02-13 VITALS — BP 128/70 | HR 92 | Temp 96.9°F | Wt 174.4 lb

## 2020-02-13 DIAGNOSIS — Z7189 Other specified counseling: Secondary | ICD-10-CM

## 2020-02-13 DIAGNOSIS — K219 Gastro-esophageal reflux disease without esophagitis: Secondary | ICD-10-CM | POA: Diagnosis not present

## 2020-02-13 NOTE — Patient Instructions (Addendum)
Call [phone] for bereavement counseling  Take 2 Prilosec daily over-the-counter for the next couple weeks and see if this will quiet it down and then you can use it as needed after that.. If you keep having difficulty come back and we will reevaluate

## 2020-02-13 NOTE — Progress Notes (Signed)
   Subjective:    Patient ID: Kathy [name], female    DOB: June 10, 1951, 69 y.o.   MRN: [id number]  HPI She complains of difficulty with excessive gas and acid feeling for the last several weeks.  She does note that certain foods do make this worse.  No nausea, vomiting, feeling of food getting stuck. Also recently her husband died.  She seems to be handling this well.  She has reached out to her minister.   Review of Systems     Objective:   Physical Exam Alert and in no distress.  Cardiac exam shows regular rhythm without murmurs or gallops.  Lungs are clear to auscultation.  Abdominal exam shows no masses or tenderness       Assessment & Plan:  Gastroesophageal reflux disease without esophagitis  Bereavement counseling Recommend she try to use Prilosec daily for the next week or 2 to see if that quiets things down and if not she will call.  Also cautioned her own avoiding foods that she knows will make this worse. I then discussed the death of her husband in bereavement with this.  Explained that all the feelings and thought she is having is normal.  Encouraged her to continue to reach out to the minister and also call hospice for bereavement counseling.

## 2020-03-27 DIAGNOSIS — Z1231 Encounter for screening mammogram for malignant neoplasm of breast: Secondary | ICD-10-CM | POA: Diagnosis not present

## 2020-03-27 LAB — HM MAMMOGRAPHY

## 2020-06-05 DIAGNOSIS — S8002XA Contusion of left knee, initial encounter: Secondary | ICD-10-CM | POA: Diagnosis not present

## 2020-06-05 DIAGNOSIS — M25562 Pain in left knee: Secondary | ICD-10-CM | POA: Diagnosis not present

## 2020-06-25 ENCOUNTER — Other Ambulatory Visit: Payer: Self-pay

## 2020-06-25 ENCOUNTER — Ambulatory Visit: Payer: Medicare Other | Admitting: Podiatry

## 2020-06-25 ENCOUNTER — Encounter: Payer: Self-pay | Admitting: Podiatry

## 2020-06-25 DIAGNOSIS — M779 Enthesopathy, unspecified: Secondary | ICD-10-CM

## 2020-06-25 DIAGNOSIS — M19071 Primary osteoarthritis, right ankle and foot: Secondary | ICD-10-CM

## 2020-06-25 DIAGNOSIS — M2021 Hallux rigidus, right foot: Secondary | ICD-10-CM

## 2020-06-25 MED ORDER — DICLOFENAC SODIUM 1 % EX GEL: CUTANEOUS | 1 refills | Status: DC

## 2020-06-26 NOTE — Progress Notes (Signed)
Subjective: 69 year old female presents the office today for follow-up evaluation of recurrence of pain to the right big toe joint as well as the right ankle.  She was last seen by Dr. Posey Pronto has had steroid injections which did well.  She wants another injection today but also discussed other options.  Majority pain is to her right big toe when she tries to bend it.  No recent injury or falls. Denies any systemic complaints such as fevers, chills, nausea, vomiting. No acute changes since last appointment, and no other complaints at this time.   Objective: AAO x3, NAD DP/PT pulses palpable bilaterally, CRT less than 3 seconds Decreasing to motion of right first MPJ with dorsal spurring and there is crepitation with MPJ range of motion.  There is discomfort also the anterior medial aspect of the right ankle joint.  No discomfort ankle joint range of motion there is no crepitation.  Flexor, extensor tendons appear to be intact.  No other areas of tenderness identified today.  No pain with calf compression, swelling, warmth, erythema  Assessment: Right hallux limitus, right ankle pain/capsulitis  Plan: -All treatment options discussed with the patient including all alternatives, risks, complications.  -Steroid injection performed to the right ankle as well as first MPJ.  See procedure notes below. -Discussed orthotics but I dispensed graphite inserts today.  Discussed surgical intervention as well. -Patient encouraged to call the office with any questions, concerns, change in symptoms.   Procedure: Injection small joint Discussed alternatives, risks, complications and verbal consent was obtained.  Location: Right first MPJ Skin Prep: Betadine. Injectate: 0.5cc 0.5% marcaine plain, 0.5 cc 2% lidocaine plain and, 1 cc kenalog 10. Disposition: Patient tolerated procedure well. Injection site dressed with a band-aid.  Post-injection care was discussed and return precautions discussed.   Procedure:  Injection intermediate joint Discussed alternatives, risks, complications and verbal consent was obtained.  Location: Right anterior medial ankle Skin Prep: Betadine. Injectate: 0.5cc 0.5% marcaine plain, 0.5 cc 2% lidocaine plain and, 1 cc kenalog 10. Disposition: Patient tolerated procedure well. Injection site dressed with a band-aid.  Post-injection care was discussed and return precautions discussed.    Return in about 3 months (around 09/25/2020), or if symptoms worsen or fail to improve.  Trula Slade DPM

## 2020-07-18 NOTE — Progress Notes (Signed)
[facility] Note  07/23/2020     CHIEF COMPLAINT Patient presents for Retina Follow Up   HISTORY OF PRESENT ILLNESS: Kathy [name] is a 69 y.o. female who presents to the clinic today for:    HPI    Retina Follow Up    Patient presents with  Other.  In right eye.  This started weeks ago.  Severity is moderate.  Duration of weeks.  Since onset it is stable.  I, the attending physician,  performed the HPI with the patient and updated documentation appropriately.          Comments    Pt states vision is the same OU.  Patient denies eye pain or discomfort and denies any new or worsening floaters or fol OU.       Last edited by Bernarda Caffey, MD on 07/23/2020  9:42 AM. (History)    pt states vision has been stable since last visit, pt is on Restasis , her next appt with Dr. Frederico Hamman is in October   Referring physician: Gevena Cotton, MD [place],  [place] [id number]  HISTORICAL INFORMATION:   Selected notes from the MEDICAL RECORD NUMBER Referred by Dr. Gevena Cotton for concern of central areolar choroidal dystrophy LEE: 05.16.19 (MFrederico Hamman) [BCVA: OD: 20/30-2 OS: CF] Ocular Hx-Central areolar choroidal dystrophy, Macular Degeneration, exotropia OS, cataracts OU, dry eyes PMH-HTN, Fibromyalgia     CURRENT MEDICATIONS: Current Outpatient Medications (Ophthalmic Drugs)  Medication Sig  . cycloSPORINE (RESTASIS) 0.05 % ophthalmic emulsion Place 1 drop into both eyes 2 (two) times daily.  Marland Kitchen ofloxacin (OCUFLOX) 0.3 % ophthalmic solution 1 DROP 3 TIMES A DAY IN OPERATIVE EYE START 2 DAYS PRIOR TO SURGERY AND AFTER SURGERY X3 WEEKS.  . prednisoLONE acetate (PRED FORTE) 1 % ophthalmic suspension PLEASE SEE ATTACHED FOR DETAILED DIRECTIONS   No current facility-administered medications for this visit. (Ophthalmic Drugs)   Current Outpatient Medications (Other)  Medication Sig  . acetaminophen (TYLENOL) 500 MG tablet  Take 2 tablets (1,000 mg total) by mouth every 8 (eight) hours.  . diclofenac Sodium (VOLTAREN) 1 % GEL APPLY 2 GRAMS TO AFFECTED AREA 4 TIMES A DAY  . docusate sodium (COLACE) 100 MG capsule Take 1 capsule (100 mg total) by mouth 2 (two) times daily. (Patient not taking: Reported on 02/13/2020)  . ferrous sulfate (FERROUSUL) 325 (65 FE) MG tablet Take 1 tablet (325 mg total) by mouth 3 (three) times daily with meals for 14 days. (Patient not taking: Reported on 08/31/2019)  . HYDROmorphone (DILAUDID) 2 MG tablet TAKE 1 2 BY MOUTH EVERY 4 HOURS AS NEEDED FOR PAIN**INS MAX 8 TABS/DAY  . meloxicam (MOBIC) 7.5 MG tablet TAKE 1 TABLET EVERY DAY  . methocarbamol (ROBAXIN) 500 MG tablet Take 1 tablet (500 mg total) by mouth every 6 (six) hours as needed for muscle spasms. (Patient not taking: Reported on 02/13/2020)  . Multiple Vitamins-Minerals (PRESERVISION AREDS 2) CAPS Take 1 capsule by mouth 2 (two) times a day.  . Olmesartan-amLODIPine-HCTZ 40-10-12.5 MG TABS TAKE 1 TABLET BY MOUTH EVERY DAY  . polyethylene glycol (MIRALAX / GLYCOLAX) 17 g packet Take 17 g by mouth 2 (two) times daily. (Patient not taking: Reported on 02/13/2020)   No current facility-administered medications for this visit. (Other)   REVIEW OF SYSTEMS: ROS    Positive for: Eyes   Negative for: Constitutional, Gastrointestinal, Neurological, Skin, Genitourinary, Musculoskeletal, HENT, Endocrine, Cardiovascular, Respiratory, Psychiatric, Allergic/Imm, Heme/Lymph  Last edited by Doneen Poisson on 07/23/2020  9:00 AM. (History)     ALLERGIES Allergies  Allergen Reactions  . Lortab [Hydrocodone-Acetaminophen] Nausea And Vomiting  . Latex Other (See Comments)    Pt unsure of reaction!   PAST MEDICAL HISTORY Past Medical History:  Diagnosis Date  . Exotropia of left eye   . Fibromyalgia   . History of uterine leiomyoma   . Hypertension   . Macular degeneration   . OA (osteoarthritis)   . Overactive bladder   .  Sarcoidosis   . Wears glasses    Past Surgical History:  Procedure Laterality Date  . ABDOMINAL HYSTERECTOMY  2013  . ADJUSTABLE SUTURE MANIPULATION Left 04/10/2015   Procedure: ADJUSTABLE SUTURE MANIPULATION;  Surgeon: Gevena Cotton, MD;  Location: [facility];  Service: Ophthalmology;  Laterality: Left;  . EYE SURGERY    . MEDIAN RECTUS REPAIR Left 04/10/2015   Procedure: MEDIAN RECTUS RESECTION LATERAL RECTUS RECESSION,AJUSTABLES SUTURES LEFT EYE;  Surgeon: Gevena Cotton, MD;  Location: [facility];  Service: Ophthalmology;  Laterality: Left;  . TONSILLECTOMY  as child  . TOTAL ABDOMINAL HYSTERECTOMY W/ BILATERAL SALPINGOOPHORECTOMY  10-14-2010   and TVT midurethral sling and culdoplasty  . TOTAL KNEE ARTHROPLASTY Left 06/15/2019   Procedure: TOTAL KNEE ARTHROPLASTY;  Surgeon: Paralee Cancel, MD;  Location: [facility];  Service: Orthopedics;  Laterality: Left;  70 mins    FAMILY HISTORY Family History  Problem Relation Age of Onset  . Colon cancer Mother   . Diabetes Father   . Diabetes Sister   . Hypertension Sister   . Diabetes Sister   . Hypertension Sister   . Hypertension Sister     SOCIAL HISTORY Social History   Tobacco Use  . Smoking status: Never Smoker  . Smokeless tobacco: Never Used  Vaping Use  . Vaping Use: Never used  Substance Use Topics  . Alcohol use: Yes    Alcohol/week: 1.0 standard drink    Types: 1 Glasses of wine per week    Comment: rare  . Drug use: No         OPHTHALMIC EXAM:  Base Eye Exam    Visual Acuity (Snellen - Linear)      Right Left   Dist cc 20/40 -2 CF @ 3'   Dist ph cc NI NI   Correction: Glasses       Tonometry (Tonopen, 9:03 AM)      Right Left   Pressure 12 13       Pupils      Dark Light Shape React APD   Right 3 2 Round Minimal 0   Left 3 2 Round Minimal 0       Visual Fields      Left Right   Restrictions Partial outer inferior nasal deficiency Partial outer superior nasal  deficiency       Extraocular Movement      Right Left    Full Full       Neuro/Psych    Oriented x3: Yes   Mood/Affect: Normal       Dilation    Both eyes: 1.0% Mydriacyl, 2.5% Phenylephrine @ 9:03 AM        Slit Lamp and Fundus Exam    Slit Lamp Exam      Right Left   Lids/Lashes Dermatochalasis - upper lid Dermatochalasis - upper lid   Conjunctiva/Sclera mild Melanosis mild Melanosis, 1+ Injection   Cornea Arcus, Well healed temporal cataract  wounds Arcus, linear scar at 0430, Well healed temporal cataract wounds   Anterior Chamber Deep and quiet Deep and quiet   Iris Round and dilated, patches of atrophy at 1130 and 0900 Round and dilated, mild patches of atrophy   Lens PC IOL in good position, mild pigment on optic PC IOL in good position, mild pigment on optic, 1-2+ Posterior capsular opacification   Vitreous Vitreous syneresis, Posterior vitreous detachment, vitreous condensations Vitreous syneresis       Fundus Exam      Right Left   Disc Sharp rim, mild pallor, mild PPP Tilted disc, mild pallor, sharp rim, almost 360 degrees of PPA   C/D Ratio 0.4 0.4   Macula large area of RPE and choroidal atrophy with small central foveal island spared; central island w/ pigment clumping  large area of choroidal and retinal pigment epithelial atrophy extending to nasal side of disc -- stable from prior, Pigment clumping, No heme    Vessels Vascular attenuation, Tortuous Vascular attenuation, mild tortuousity   Periphery Attached,  vitreous condensations at 0300 with retinal break and +SRF / focal RD within bed of retinoschisis -- stable with good laser surrounding, ?schisis ST periphery Attached             IMAGING AND PROCEDURES  Imaging and Procedures for @TODAY @  OCT, Retina - OU - Both Eyes       Right Eye Quality was good. Central Foveal Thickness: 176. Progression has been stable. Findings include abnormal foveal contour, inner retinal atrophy, outer retinal atrophy,  no SRF, no IRF, retinal drusen  (diffuse macular atrophy with central island - stable from prior, en face image stable, nasal schisis caught on widefield).   Left Eye Quality was good. Central Foveal Thickness: 197. Progression has been stable. Findings include outer retinal atrophy, inner retinal atrophy, abnormal foveal contour, no IRF, no SRF (Diffuse central atrophy; en face image stable).   Notes *Images captured and stored on drive  Diagnosis / Impression:  Diffuse macular atrophy OU -- Stable from prior OD with central island of non-atrophied retina  Central areolar choroidal dystrophy OU Retinoschisis nasal periphery OD -- caught on widefield  Clinical management:  See below  Abbreviations: NFP - Normal foveal profile. CME - cystoid macular edema. PED - pigment epithelial detachment. IRF - intraretinal fluid. SRF - subretinal fluid. EZ - ellipsoid zone. ERM - epiretinal membrane. ORA - outer retinal atrophy. ORT - outer retinal tubulation. SRHM - subretinal hyper-reflective material                ASSESSMENT/PLAN:    ICD-10-CM   1. Right retinoschisis  H33.101   2. Retinal detachment, right  H33.21   3. Retinal edema  H35.81 OCT, Retina - OU - Both Eyes  4. 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 Choroidal atrophy of both eyes  H31.103   6. Advanced atrophic nonexudative age-related macular degeneration of both eyes with subfoveal involvement  H35.3134   7. Pseudophakia of both eyes  Z96.1   8. Strabismus  H50.9     1-3. Localized, focal retinal detachment / SRF surrounding small retinal tear at 0330, within retinoschisis cavity, RIGHT EYE  - asymptomatic, but found on exam 5.23.19  - tear located at 0330 OD  - S/P laser retinopexy OD (05.23.19), S/P touch up laser retinopexy OD (06.28.19)  - BCVA: 20/40   - good laser in place  - no change in SRF/schisis-detachment  - f/u in 6 months --  repeat widefield OCT over 0300 schisis cavity  4-6. Severe outer  retinal and choroidal atrophy OU - stable today  - likely central areolar choroidal dystrophy -- pt notes declining vision that started in her 73-50s  - pt reports intermittent photopsias -- suspect may be related to progressive degeneration / dystrophy -- stably improved today  - differential also includes age related macular degeneration, non-exudative, both eyes  - The incidence, anatomy, and pathology of dry AMD, risk of progression, and the AREDS and AREDS 2 study including smoking risks discussed with patient.  - continue amsler grid monitoring  - f/u q6 mos  7. Pseudophakia OU  - s/p CE/IOL OU Gershon Crane, June 2020)  - beautiful surgeries, doing well  - monitor  8. Strabismus  - under the expert management of Dr. Frederico Hamman  - s/p EOM surgery 04/10/15  - monitor  Ophthalmic Meds Ordered this visit:  No orders of the defined types were placed in this encounter.     Return in about 6 months (around 01/23/2021) for f/u 6 months, retinoschisis OU, DFE, OCT.  There are no Patient Instructions on file for this visit.  Explained the diagnoses, plan, and follow up with the patient and they expressed understanding.  Patient expressed understanding of the importance of proper follow up care.   This document serves as a record of services personally performed by Gardiner Sleeper, MD, PhD. It was created on their behalf by Estill Bakes, COT an ophthalmic technician. The creation of this record is the provider's dictation and/or activities during the visit.    Electronically signed by: Estill Bakes, COT 8.19.21 @ 1:19 PM   This document serves as a record of services personally performed by Gardiner Sleeper, MD, PhD. It was created on their behalf by San Jetty. Owens Shark, OA an ophthalmic technician. The creation of this record is the provider's dictation and/or activities during the visit.    Electronically signed by: San Jetty. Owens Shark, [place] 08.24.2021 1:19 PM  Gardiner Sleeper, M.D., Ph.D. Diseases  & Surgery of the Retina and [place] 07/23/2020   I have reviewed the above documentation for accuracy and completeness, and I agree with the above. Gardiner Sleeper, M.D., Ph.D. 07/23/20 1:19 PM   Abbreviations: M myopia (nearsighted); A astigmatism; H hyperopia (farsighted); P presbyopia; Mrx spectacle prescription;  CTL contact lenses; OD right eye; OS left eye; OU both eyes  XT exotropia; ET esotropia; PEK punctate epithelial keratitis; PEE punctate epithelial erosions; DES dry eye syndrome; MGD meibomian gland dysfunction; ATs artificial tears; PFAT's preservative free artificial tears; [place] nuclear sclerotic cataract; PSC posterior subcapsular cataract; ERM epi-retinal membrane; PVD posterior vitreous detachment; RD retinal detachment; DM diabetes mellitus; DR diabetic retinopathy; NPDR non-proliferative diabetic retinopathy; PDR proliferative diabetic retinopathy; CSME clinically significant macular edema; DME diabetic macular edema; dbh dot blot hemorrhages; CWS cotton wool spot; POAG primary open angle glaucoma; C/D cup-to-disc ratio; HVF humphrey visual field; GVF goldmann visual field; OCT optical coherence tomography; IOP intraocular pressure; BRVO Branch retinal vein occlusion; CRVO central retinal vein occlusion; CRAO central retinal artery occlusion; BRAO branch retinal artery occlusion; RT retinal tear; SB scleral buckle; PPV pars plana vitrectomy; VH Vitreous hemorrhage; PRP panretinal laser photocoagulation; IVK intravitreal kenalog; VMT vitreomacular traction; MH Macular hole;  NVD neovascularization of the disc; NVE neovascularization elsewhere; AREDS age related eye disease study; ARMD age related macular degeneration; POAG primary open angle glaucoma; EBMD epithelial/anterior basement membrane dystrophy; ACIOL anterior chamber intraocular lens; IOL  intraocular lens; PCIOL posterior chamber intraocular lens; Phaco/IOL phacoemulsification with intraocular  lens placement; [place] photorefractive keratectomy; LASIK laser assisted in situ keratomileusis; HTN hypertension; DM diabetes mellitus; COPD chronic obstructive pulmonary disease

## 2020-07-23 ENCOUNTER — Other Ambulatory Visit: Payer: Self-pay

## 2020-07-23 ENCOUNTER — Ambulatory Visit (INDEPENDENT_AMBULATORY_CARE_PROVIDER_SITE_OTHER): Payer: Medicare Other | Admitting: Ophthalmology

## 2020-07-23 ENCOUNTER — Encounter (INDEPENDENT_AMBULATORY_CARE_PROVIDER_SITE_OTHER): Payer: Self-pay | Admitting: Ophthalmology

## 2020-07-23 DIAGNOSIS — H31103 Choroidal degeneration, unspecified, bilateral: Secondary | ICD-10-CM | POA: Diagnosis not present

## 2020-07-23 DIAGNOSIS — H3581 Retinal edema: Secondary | ICD-10-CM | POA: Diagnosis not present

## 2020-07-23 DIAGNOSIS — H3122 Choroidal dystrophy (central areolar) (generalized) (peripapillary): Secondary | ICD-10-CM | POA: Diagnosis not present

## 2020-07-23 DIAGNOSIS — H509 Unspecified strabismus: Secondary | ICD-10-CM

## 2020-07-23 DIAGNOSIS — H353134 Nonexudative age-related macular degeneration, bilateral, advanced atrophic with subfoveal involvement: Secondary | ICD-10-CM

## 2020-07-23 DIAGNOSIS — H33101 Unspecified retinoschisis, right eye: Secondary | ICD-10-CM | POA: Diagnosis not present

## 2020-07-23 DIAGNOSIS — Z961 Presence of intraocular lens: Secondary | ICD-10-CM

## 2020-07-23 DIAGNOSIS — H3321 Serous retinal detachment, right eye: Secondary | ICD-10-CM | POA: Diagnosis not present

## 2020-09-02 ENCOUNTER — Ambulatory Visit (INDEPENDENT_AMBULATORY_CARE_PROVIDER_SITE_OTHER): Payer: Medicare Other | Admitting: Family Medicine

## 2020-09-02 ENCOUNTER — Other Ambulatory Visit: Payer: Self-pay

## 2020-09-02 ENCOUNTER — Encounter: Payer: Self-pay | Admitting: Family Medicine

## 2020-09-02 VITALS — BP 124/72 | HR 104 | Temp 97.4°F | Ht 63.5 in | Wt 181.6 lb

## 2020-09-02 DIAGNOSIS — K219 Gastro-esophageal reflux disease without esophagitis: Secondary | ICD-10-CM | POA: Diagnosis not present

## 2020-09-02 DIAGNOSIS — E6609 Other obesity due to excess calories: Secondary | ICD-10-CM

## 2020-09-02 DIAGNOSIS — H6123 Impacted cerumen, bilateral: Secondary | ICD-10-CM

## 2020-09-02 DIAGNOSIS — H3530 Unspecified macular degeneration: Secondary | ICD-10-CM

## 2020-09-02 DIAGNOSIS — I10 Essential (primary) hypertension: Secondary | ICD-10-CM

## 2020-09-02 DIAGNOSIS — M1990 Unspecified osteoarthritis, unspecified site: Secondary | ICD-10-CM

## 2020-09-02 DIAGNOSIS — Z96652 Presence of left artificial knee joint: Secondary | ICD-10-CM

## 2020-09-02 DIAGNOSIS — Z0000 Encounter for general adult medical examination without abnormal findings: Secondary | ICD-10-CM

## 2020-09-02 DIAGNOSIS — N318 Other neuromuscular dysfunction of bladder: Secondary | ICD-10-CM | POA: Diagnosis not present

## 2020-09-02 DIAGNOSIS — Z23 Encounter for immunization: Secondary | ICD-10-CM | POA: Diagnosis not present

## 2020-09-02 DIAGNOSIS — Z13220 Encounter for screening for lipoid disorders: Secondary | ICD-10-CM

## 2020-09-02 DIAGNOSIS — M797 Fibromyalgia: Secondary | ICD-10-CM

## 2020-09-02 MED ORDER — MELOXICAM 7.5 MG PO TABS: 7.5000 mg | ORAL_TABLET | Freq: Every day | ORAL | 3 refills | Status: DC

## 2020-09-02 MED ORDER — OLMESARTAN-AMLODIPINE-HCTZ 40-10-12.5 MG PO TABS: 1.0000 | ORAL_TABLET | Freq: Every day | ORAL | 3 refills | Status: DC

## 2020-09-02 NOTE — Progress Notes (Signed)
Kathy [name] is a 69 y.o. female who presents for annual wellness visit,CPE and follow-up on chronic medical conditions.  She does have a history of macular degeneration and does follow-up regularly with ophthalmology.  She also has a history of colonic polyps and has been seen by Dr. Henrene Pastor for this.  She is scheduled for routine follow-up on that.  She does complain of arthritis but usually takes care of this with Tylenol and also use of meloxicam on an as-needed basis.  She does have a history of fibromyalgia but this seems to be under fairly good control.  She has had a left TKR and seems to be doing fairly well with that.  She continues on her blood pressure medication and is having no difficulty with that.  She does have a history of OAB but does not complain of any present problems.  She has a history of reflux disease but is not having any difficulty with that at the present time.  She does not smoke but drinks usually 2 glasses of wine per day. She continues on her blood pressure medication and is having no difficulty with that.  Otherwise family and social history is unremarkable Immunizations and Health Maintenance Immunization History  Administered Date(s) Administered  . Fluad Quad(high Dose 65+) 08/31/2019  . Influenza Split 08/28/2011, 09/23/2012  . Influenza, High Dose Seasonal PF 09/30/2016, 09/01/2017, 08/24/2018  . Influenza,inj,Quad PF,6+ Mos 09/13/2013, 08/30/2014, 09/10/2015  . PFIZER SARS-COV-2 Vaccination 01/23/2020, 02/13/2020  . Pneumococcal Conjugate-13 08/30/2014  . Pneumococcal Polysaccharide-23 02/03/2016  . Tdap 02/03/2016  . Zoster 01/28/2012  . Zoster Recombinat (Shingrix) 09/16/2017   Health Maintenance Due  Topic Date Due  . INFLUENZA VACCINE  06/30/2020    Last Pap smear: aged out  Last mammogram: 03/27/20 Last colonoscopy: 12/14/16 Last DEXA: 10/20/18 Dentist: Q six months Ophtho: Q six months  Exercise: walking one hour a day three days a week    Other doctors caring for patient include: Dr. Jacqualyn Posey Podiatry, Dr. Tacey Heap, Dr. Coralyn Pear retina.Henrene Pastor GI  Advanced directives: Does Patient Have a Medical Advance Directive?: No Would patient like information on creating a medical advance directive?: Yes (ED - Information included in AVS)  Depression screen:  See questionnaire below.  Depression screen [facility] 2/9 09/02/2020 08/31/2019 08/24/2018 04/13/2017 02/03/2016  Decreased Interest 0 0 0 0 0  Down, Depressed, Hopeless 0 0 0 0 0  PHQ - 2 Score 0 0 0 0 0    Fall Risk Screen: see questionnaire below. Fall Risk  09/02/2020 08/31/2019 04/13/2017  Falls in the past year? 1 0 No  Number falls in past yr: 0 - -  Injury with Fall? 0 - -    ADL screen:  See questionnaire below Functional Status Survey: Is the patient deaf or have difficulty hearing?: No Does the patient have difficulty seeing, even when wearing glasses/contacts?: Yes Does the patient have difficulty concentrating, remembering, or making decisions?: No Does the patient have difficulty walking or climbing stairs?: Yes (due to pain) Does the patient have difficulty dressing or bathing?: No Does the patient have difficulty doing errands alone such as visiting a doctor's office or shopping?: No   Review of Systems Constitutional: -, -unexpected weight change, -anorexia, -fatigue Allergy: -sneezing, -itching, -congestion Dermatology: denies changing moles, rash, lumps ENT: -runny nose, -ear pain, -sore throat,  Cardiology:  -chest pain, -palpitations, -orthopnea, Respiratory: -cough, -shortness of breath, -dyspnea on exertion, -wheezing,  Gastroenterology: -abdominal pain, -nausea, -vomiting, -diarrhea, -constipation, -dysphagia Hematology: -bleeding or bruising  problems Musculoskeletal: -arthralgias, -myalgias, -joint swelling, -back pain, - Ophthalmology: -vision changes,  Urology: -dysuria, -difficulty urinating,  -urinary frequency, -urgency, incontinence Neurology: -,  -numbness, , -memory loss, -falls, -dizziness    PHYSICAL EXAM:  General Appearance: Alert, cooperative, no distress, appears stated age Head: Normocephalic, without obvious abnormality, atraumatic Eyes: PERRL, conjunctiva/corneas clear, EOM's intact,  Ears: Normal TM's and external ear canals noted after cerumen was removed from both ears. Nose: Nares normal, mucosa normal, no drainage or sinus tenderness Throat: Lips, mucosa, and tongue normal; teeth and gums normal Neck: Supple, no lymphadenopathy;  thyroid:  no enlargement/tenderness/nodules; no carotid bruit or JVD Lungs: Clear to auscultation bilaterally without wheezes, rales or ronchi; respirations unlabored Heart: Regular rate and rhythm, S1 and S2 normal, no murmur, rubor gallop Abdomen: Soft, non-tender, nondistended, normoactive bowel sounds,  no masses, no hepatosplenomegaly Extremities: No clubbing, cyanosis or edema Pulses: 2+ and symmetric all extremities Skin:  Skin color, texture, turgor normal, no rashes or lesions Lymph nodes: Cervical, supraclavicular, and axillary nodes normal Neurologic:  CNII-XII intact, normal strength, sensation and gait; reflexes 2+ and symmetric throughout Psych: Normal mood, affect, hygiene and grooming.  ASSESSMENT/PLAN: Routine general medical examination at a health care facility - Plan: CBC with Differential/Platelet, Comprehensive metabolic panel, Lipid panel  Essential hypertension - Plan: Olmesartan-amLODIPine-HCTZ 40-10-12.5 MG TABS  OVERACTIVE BLADDER  Gastroesophageal reflux disease without esophagitis - Plan: CBC with Differential/Platelet, Comprehensive metabolic panel  Macular degeneration, unspecified laterality, unspecified type  Fibromyalgia - Plan: meloxicam (MOBIC) 7.5 MG tablet  Arthritis - Plan: meloxicam (MOBIC) 7.5 MG tablet  S/P left TKA  Class 2 obesity due to excess calories without serious comorbidity in adult, unspecified BMI - Plan: CBC with  Differential/Platelet, Comprehensive metabolic panel, Lipid panel  Screening for lipid disorders - Plan: Lipid panel  Need for influenza vaccination - Plan: Flu Vaccine QUAD High Dose(Fluad)  Immunization, viral disease - Plan: Pfizer SARS-COV-2 Vaccine  Bilateral impacted cerumen Information concerning fibromyalgia also given. Discussed healthy diet, including goals of calcium and vitamin D intake and alcohol recommendations (less than or equal to 1 drink/day) reviewed;Immunization recommendations discussed.  Colonoscopy recommendations reviewed Also encouraged her to continue with her rehab on the knee.  She is to get the Shingrix vaccine through the pharmacy.  Medicare Attestation I have personally reviewed: The patient's medical and social history Their use of alcohol, tobacco or illicit drugs Their current medications and supplements The patient's functional ability including ADLs,fall risks, home safety risks, cognitive, and hearing and visual impairment Diet and physical activities Evidence for depression or mood disorders  The patient's weight, height, and BMI have been recorded in the chart.  I have made referrals, counseling, and provided education to the patient based on review of the above and I have provided the patient with a written personalized care plan for preventive services.     Jill Alexanders, MD   09/02/2020

## 2020-09-02 NOTE — Patient Instructions (Signed)
Myofascial Pain Syndrome and Fibromyalgia Myofascial pain syndrome and fibromyalgia are both pain disorders. This pain may be felt mainly in your muscles.  Myofascial pain syndrome: ? Always has tender points in the muscle that will cause pain when pressed (trigger points). The pain may come and go. ? Usually affects your neck, upper back, and shoulder areas. The pain often radiates into your arms and hands.  Fibromyalgia: ? Has muscle pains and tenderness that come and go. ? Is often associated with fatigue and sleep problems. ? Has trigger points. ? Tends to be long-lasting (chronic), but is not life-threatening. Fibromyalgia and myofascial pain syndrome are not the same. However, they often occur together. If you have both conditions, each can make the other worse. Both are common and can cause enough pain and fatigue to make day-to-day activities difficult. Both can be hard to diagnose because their symptoms are common in many other conditions. What are the causes? The exact causes of these conditions are not known. What increases the risk? You are more likely to develop this condition if:  You have a family history of the condition.  You have certain triggers, such as: ? Spine disorders. ? An injury (trauma) or other physical stressors. ? Being under a lot of stress. ? Medical conditions such as osteoarthritis, rheumatoid arthritis, or lupus. What are the signs or symptoms? Fibromyalgia The main symptom of fibromyalgia is widespread pain and tenderness in your muscles. Pain is sometimes described as stabbing, shooting, or burning. You may also have:  Tingling or numbness.  Sleep problems and fatigue.  Problems with attention and concentration (fibro fog). Other symptoms may include:  Bowel and bladder problems.  Headaches.  Visual problems.  Problems with odors and noises.  Depression or mood changes.  Painful menstrual periods (dysmenorrhea).  Dry skin or  eyes. These symptoms can vary over time. Myofascial pain syndrome Symptoms of myofascial pain syndrome include:  Tight, ropy bands of muscle.  Uncomfortable sensations in muscle areas. These may include aching, cramping, burning, numbness, tingling, and weakness.  Difficulty moving certain parts of the body freely (poor range of motion). How is this diagnosed? This condition may be diagnosed by your symptoms and medical history. You will also have a physical exam. In general:  Fibromyalgia is diagnosed if you have pain, fatigue, and other symptoms for more than 3 months, and symptoms cannot be explained by another condition.  Myofascial pain syndrome is diagnosed if you have trigger points in your muscles, and those trigger points are tender and cause pain elsewhere in your body (referred pain). How is this treated? Treatment for these conditions depends on the type that you have.  For fibromyalgia: ? Pain medicines, such as NSAIDs. ? Medicines for treating depression. ? Medicines for treating seizures. ? Medicines that relax the muscles.  For myofascial pain: ? Pain medicines, such as NSAIDs. ? Cooling and stretching of muscles. ? Trigger point injections. ? Sound wave (ultrasound) treatments to stimulate muscles. Treating these conditions often requires a team of health care providers. These may include:  Your primary care provider.  Physical therapist.  Complementary health care providers, such as massage therapists or acupuncturists.  Psychiatrist for cognitive behavioral therapy. Follow these instructions at home: Medicines  Take over-the-counter and prescription medicines only as told by your health care provider.  Do not drive or use heavy machinery while taking prescription pain medicine.  If you are taking prescription pain medicine, take actions to prevent or treat constipation. Your health care   provider may recommend that you: ? Drink enough fluid to keep  your urine pale yellow. ? Eat foods that are high in fiber, such as fresh fruits and vegetables, whole grains, and beans. ? Limit foods that are high in fat and processed sugars, such as fried or sweet foods. ? Take an over-the-counter or prescription medicine for constipation. Lifestyle   Exercise as directed by your health care provider or physical therapist.  Practice relaxation techniques to control your stress. You may want to try: ? Biofeedback. ? Visual imagery. ? Hypnosis. ? Muscle relaxation. ? Yoga. ? Meditation.  Maintain a healthy lifestyle. This includes eating a healthy diet and getting enough sleep.  Do not use any products that contain nicotine or tobacco, such as cigarettes and e-cigarettes. If you need help quitting, ask your health care provider. General instructions  Talk to your health care provider about complementary treatments, such as acupuncture or massage.  Consider joining a support group with others who are diagnosed with this condition.  Do not do activities that stress or strain your muscles. This includes repetitive motions and heavy lifting.  Keep all follow-up visits as told by your health care provider. This is important. Where to find more information  National Fibromyalgia Association: www.fmaware.org  Arthritis Foundation: www.arthritis.org  American Chronic Pain Association: www.theacpa.org Contact a health care provider if:  You have new symptoms.  Your symptoms get worse or your pain is severe.  You have side effects from your medicines.  You have trouble sleeping.  Your condition is causing depression or anxiety. Summary  Myofascial pain syndrome and fibromyalgia are pain disorders.  Myofascial pain syndrome has tender points in the muscle that will cause pain when pressed (trigger points). Fibromyalgia also has muscle pains and tenderness that come and go, but this condition is often associated with fatigue and sleep  disturbances.  Fibromyalgia and myofascial pain syndrome are not the same but often occur together, causing pain and fatigue that make day-to-day activities difficult.  Treatment for fibromyalgia includes taking medicines to relax the muscles and medicines for pain, depression, or seizures. Treatment for myofascial pain syndrome includes taking medicines for pain, cooling and stretching of muscles, and injecting medicines into trigger points.  Follow your health care provider's instructions for taking medicines and maintaining a healthy lifestyle. This information is not intended to replace advice given to you by your health care provider. Make sure you discuss any questions you have with your health care provider. Document Revised: 03/10/2019 Document Reviewed: 12/01/2017 Elsevier Patient Education  2020 [employer]  

## 2020-09-03 ENCOUNTER — Other Ambulatory Visit: Payer: Self-pay

## 2020-09-03 DIAGNOSIS — H538 Other visual disturbances: Secondary | ICD-10-CM | POA: Diagnosis not present

## 2020-09-03 DIAGNOSIS — Z961 Presence of intraocular lens: Secondary | ICD-10-CM | POA: Diagnosis not present

## 2020-09-03 DIAGNOSIS — H5440 Blindness, one eye, unspecified eye: Secondary | ICD-10-CM | POA: Diagnosis not present

## 2020-09-03 DIAGNOSIS — E871 Hypo-osmolality and hyponatremia: Secondary | ICD-10-CM

## 2020-09-03 DIAGNOSIS — H53419 Scotoma involving central area, unspecified eye: Secondary | ICD-10-CM | POA: Diagnosis not present

## 2020-09-03 DIAGNOSIS — H3122 Choroidal dystrophy (central areolar) (generalized) (peripapillary): Secondary | ICD-10-CM | POA: Diagnosis not present

## 2020-09-03 LAB — CBC WITH DIFFERENTIAL/PLATELET
Basophils Absolute: 0 10*3/uL (ref 0.0–0.2)
Basos: 1 %
EOS (ABSOLUTE): 0 10*3/uL (ref 0.0–0.4)
Eos: 0 %
Hematocrit: 41.3 % (ref 34.0–46.6)
Hemoglobin: 14.2 g/dL (ref 11.1–15.9)
Immature Grans (Abs): 0 10*3/uL (ref 0.0–0.1)
Immature Granulocytes: 0 %
Lymphocytes Absolute: 1 10*3/uL (ref 0.7–3.1)
Lymphs: 18 %
MCH: 32.6 pg (ref 26.6–33.0)
MCHC: 34.4 g/dL (ref 31.5–35.7)
MCV: 95 fL (ref 79–97)
Monocytes Absolute: 1.1 10*3/uL — ABNORMAL HIGH (ref 0.1–0.9)
Monocytes: 20 %
Neutrophils Absolute: 3.4 10*3/uL (ref 1.4–7.0)
Neutrophils: 61 %
Platelets: 344 10*3/uL (ref 150–450)
RBC: 4.35 x10E6/uL (ref 3.77–5.28)
RDW: 12 % (ref 11.7–15.4)
WBC: 5.5 10*3/uL (ref 3.4–10.8)

## 2020-09-03 LAB — COMPREHENSIVE METABOLIC PANEL
ALT: 29 IU/L (ref 0–32)
AST: 83 IU/L — ABNORMAL HIGH (ref 0–40)
Albumin/Globulin Ratio: 2 (ref 1.2–2.2)
Albumin: 4.9 g/dL — ABNORMAL HIGH (ref 3.8–4.8)
Alkaline Phosphatase: 142 IU/L — ABNORMAL HIGH (ref 44–121)
BUN/Creatinine Ratio: 16 (ref 12–28)
BUN: 12 mg/dL (ref 8–27)
Bilirubin Total: 0.6 mg/dL (ref 0.0–1.2)
CO2: 24 mmol/L (ref 20–29)
Calcium: 9.8 mg/dL (ref 8.7–10.3)
Chloride: 90 mmol/L — ABNORMAL LOW (ref 96–106)
Creatinine, Ser: 0.76 mg/dL (ref 0.57–1.00)
GFR calc Af Amer: 93 mL/min/{1.73_m2} (ref >59)
GFR calc non Af Amer: 80 mL/min/{1.73_m2} (ref >59)
Globulin, Total: 2.4 g/dL (ref 1.5–4.5)
Glucose: 97 mg/dL (ref 65–99)
Potassium: 4.2 mmol/L (ref 3.5–5.2)
Sodium: 131 mmol/L — ABNORMAL LOW (ref 134–144)
Total Protein: 7.3 g/dL (ref 6.0–8.5)

## 2020-09-03 LAB — LIPID PANEL
Chol/HDL Ratio: 2 ratio (ref 0.0–4.4)
Cholesterol, Total: 283 mg/dL — ABNORMAL HIGH (ref 100–199)
HDL: 144 mg/dL (ref >39)
LDL Chol Calc (NIH): 127 mg/dL — ABNORMAL HIGH (ref 0–99)
Triglycerides: 76 mg/dL (ref 0–149)
VLDL Cholesterol Cal: 12 mg/dL (ref 5–40)

## 2020-09-03 NOTE — Progress Notes (Signed)
bmet  

## 2020-09-09 ENCOUNTER — Other Ambulatory Visit: Payer: Medicare Other

## 2020-09-09 ENCOUNTER — Other Ambulatory Visit: Payer: Self-pay

## 2020-09-09 DIAGNOSIS — E871 Hypo-osmolality and hyponatremia: Secondary | ICD-10-CM

## 2020-09-10 LAB — BASIC METABOLIC PANEL
BUN/Creatinine Ratio: 13 (ref 12–28)
BUN: 10 mg/dL (ref 8–27)
CO2: 25 mmol/L (ref 20–29)
Calcium: 9.7 mg/dL (ref 8.7–10.3)
Chloride: 92 mmol/L — ABNORMAL LOW (ref 96–106)
Creatinine, Ser: 0.75 mg/dL (ref 0.57–1.00)
GFR calc Af Amer: 94 mL/min/{1.73_m2} (ref >59)
GFR calc non Af Amer: 82 mL/min/{1.73_m2} (ref >59)
Glucose: 95 mg/dL (ref 65–99)
Potassium: 4.1 mmol/L (ref 3.5–5.2)
Sodium: 135 mmol/L (ref 134–144)

## 2020-09-20 DIAGNOSIS — Z961 Presence of intraocular lens: Secondary | ICD-10-CM | POA: Diagnosis not present

## 2020-09-20 DIAGNOSIS — H353134 Nonexudative age-related macular degeneration, bilateral, advanced atrophic with subfoveal involvement: Secondary | ICD-10-CM | POA: Diagnosis not present

## 2020-09-20 DIAGNOSIS — H524 Presbyopia: Secondary | ICD-10-CM | POA: Diagnosis not present

## 2020-09-26 ENCOUNTER — Ambulatory Visit: Payer: Medicare Other | Admitting: Podiatry

## 2020-09-26 ENCOUNTER — Other Ambulatory Visit: Payer: Self-pay

## 2020-09-26 DIAGNOSIS — M779 Enthesopathy, unspecified: Secondary | ICD-10-CM

## 2020-09-26 DIAGNOSIS — M19071 Primary osteoarthritis, right ankle and foot: Secondary | ICD-10-CM

## 2020-09-26 DIAGNOSIS — M2021 Hallux rigidus, right foot: Secondary | ICD-10-CM

## 2020-09-27 NOTE — Progress Notes (Signed)
Subjective: 69 year old female presents the office today for evaluation of right ankle, first IPJ pain.  She states overall she is doing better but she also admits that she has not been on her feet as much recently.  She is no significant pain today.  No edema.  No recent injury or falls or changes otherwise. Denies any systemic complaints such as fevers, chills, nausea, vomiting. No acute changes since last appointment, and no other complaints at this time.   Objective: AAO x3, NAD DP/PT pulses palpable bilaterally, CRT less than 3 seconds There is decreased range of motion of first MPJ on the right foot with minimal edema there is no erythema or warmth.  There is minimal discomfort along the anteromedial aspect of the ankle joint.  Flexor, extensor tendons appear to be intact. MMT 5/5.  No pain with calf compression, swelling, warmth, erythema  Assessment: 69 year old female hallux rigidus; capsulitis ankle  Plan: -All treatment options discussed with the patient including all alternatives, risks, complications.  -At this point she is doing well.  We held off on another steroid injection.  We did discuss first MPJ arthrodesis on the right foot.  She is in considering this.  Discussed postoperative course. -For now she is to continue with conservative treatment.  Continue graphite insert, stiffer soled shoe. -Patient encouraged to call the office with any questions, concerns, change in symptoms.   Trula Slade DPM

## 2020-11-11 ENCOUNTER — Ambulatory Visit (INDEPENDENT_AMBULATORY_CARE_PROVIDER_SITE_OTHER): Payer: Medicare Other | Admitting: Family Medicine

## 2020-11-11 ENCOUNTER — Encounter: Payer: Self-pay | Admitting: Family Medicine

## 2020-11-11 ENCOUNTER — Other Ambulatory Visit: Payer: Self-pay

## 2020-11-11 VITALS — BP 122/76 | HR 106 | Temp 97.8°F | Wt 179.2 lb

## 2020-11-11 DIAGNOSIS — N3000 Acute cystitis without hematuria: Secondary | ICD-10-CM

## 2020-11-11 LAB — POCT URINALYSIS DIP (PROADVANTAGE DEVICE)
Bilirubin, UA: NEGATIVE
Blood, UA: NEGATIVE
Glucose, UA: NEGATIVE mg/dL
Ketones, POC UA: NEGATIVE mg/dL
Nitrite, UA: NEGATIVE
Specific Gravity, Urine: 1.02
Urobilinogen, Ur: NEGATIVE
pH, UA: 6 (ref 5.0–8.0)

## 2020-11-11 MED ORDER — SULFAMETHOXAZOLE-TRIMETHOPRIM 800-160 MG PO TABS: 1.0000 | ORAL_TABLET | Freq: Two times a day (BID) | ORAL | 0 refills | Status: DC

## 2020-11-11 NOTE — Patient Instructions (Signed)
Try Azo-Standard to help with your symptoms

## 2020-11-11 NOTE — Progress Notes (Signed)
   Subjective:    Patient ID: Kathy [name], female    DOB: Apr 21, 1951, 69 y.o.   MRN: [id number]  HPI She complains of a 3-day history of dysuria and questionable urgency but no fever, chills or abdominal pain.   Review of Systems     Objective:   Physical Exam Alert and in no distress.  Urine microscopic was contaminated.       Assessment & Plan:  Acute cystitis without hematuria - Plan: sulfamethoxazole-trimethoprim (BACTRIM DS) 800-160 MG tablet, POCT Urinalysis DIP (Proadvantage Device) The microscopic was contaminated but I think is worthwhile treating her.  Recommend she use Azo-Standard.

## 2020-11-18 ENCOUNTER — Ambulatory Visit (INDEPENDENT_AMBULATORY_CARE_PROVIDER_SITE_OTHER): Payer: Medicare Other

## 2020-11-18 ENCOUNTER — Other Ambulatory Visit: Payer: Self-pay

## 2020-11-18 ENCOUNTER — Encounter: Payer: Self-pay | Admitting: Podiatry

## 2020-11-18 ENCOUNTER — Ambulatory Visit: Payer: Medicare Other | Admitting: Podiatry

## 2020-11-18 DIAGNOSIS — M19071 Primary osteoarthritis, right ankle and foot: Secondary | ICD-10-CM

## 2020-11-18 DIAGNOSIS — M79675 Pain in left toe(s): Secondary | ICD-10-CM | POA: Diagnosis not present

## 2020-11-18 DIAGNOSIS — M2021 Hallux rigidus, right foot: Secondary | ICD-10-CM

## 2020-11-18 DIAGNOSIS — M79674 Pain in right toe(s): Secondary | ICD-10-CM | POA: Diagnosis not present

## 2020-11-18 DIAGNOSIS — B351 Tinea unguium: Secondary | ICD-10-CM

## 2020-11-18 NOTE — Patient Instructions (Signed)

## 2020-11-19 NOTE — Progress Notes (Signed)
Subjective: 69 year old female presents the office with her son for surgical consultation given chronic pain to her right foot.  Majority pain is to the right first MPJ.  She has tried shoe modification, offloading, injection but a significant improvement this time she was to consider surgical intervention.  Pain is on a daily basis.  She gets some occasional discomfort the ankle joint itself but minimal.  Also asking for the nails be trimmed today as are thickened elongated she cannot do them herself. Denies any systemic complaints such as fevers, chills, nausea, vomiting. No acute changes since last appointment, and no other complaints at this time.   Objective: AAO x3, NAD DP/PT pulses palpable bilaterally, CRT less than 3 seconds There is discomfort with localized swelling on the right first MPJ there is crepitation with MPJ range of motion.  There is tenderness palpation to the joint with range of motion.  There is no other area of pinpoint tenderness identified the right lower extremity.  No significant pain with ankle joint itself today. MMT 5/5 otherwise Nails are hypertrophic, dystrophic, brittle, discolored, elongated 10. No surrounding redness or drainage. Tenderness nails 1-5 bilaterally. No open lesions or pre-ulcerative lesions are identified today. No pain with calf compression, swelling, warmth, erythema  Assessment: Right foot hallux rigidus/capsulitis; symptomatic onychomycosis  Plan: -All treatment options discussed with the patient including all alternatives, risks, complications.  -X-rays obtained and reviewed with the patient and son today.  Arthritic changes present the first MPJ.  There is no evidence of acute fracture. -Regards to the right foot we discussed both conservative as well surgical treatment options.  This time does proceed with surgical intervention.  After discussion of options elects proceed with a first MPJ arthrodesis. -The incision placement as well as the  postoperative course was discussed with the patient. I discussed risks of the surgery which include, but not limited to, infection, bleeding, pain, swelling, need for further surgery, delayed or nonhealing, painful or ugly scar, numbness or sensation changes, over/under correction, recurrence, transfer lesions, further deformity, hardware failure, DVT/PE, loss of toe/foot. Patient understands these risks and wishes to proceed with surgery. The surgical consent was reviewed with the patient all 3 pages were signed. No promises or guarantees were given to the outcome of the procedure. All questions were answered to the best of my ability. Before the surgery the patient was encouraged to call the office if there is any further questions. The surgery will be performed at the [facility] on an outpatient basis. -CAM boot dispensed for postop use.  -Nails sharply debrided x 10 without any complications or bleeding -Patient encouraged to call the office with any questions, concerns, change in symptoms.   Trula Slade DPM

## 2020-11-20 ENCOUNTER — Telehealth: Payer: Self-pay

## 2020-11-20 NOTE — Telephone Encounter (Addendum)
DOS 12/18/2020  HALLUX MPJ FUSION RT - [id number]   [employer] MEDICARE EFFECTIVE DATE - 12/01/2019  PLAN DEDUCTIBLE - $0.00 OUT OF POCKET - $4500.00 W/ $5537.48 REMAINING  CO-INSURANCE 0% / Day OUTPATIENT SURGERY 0% / [place] $325 / Day OUTPATIENT SURGERY $325 / [place]  Notification or Prior Authorization is not required for the requested services  Decision ID #:[id number]

## 2020-11-21 ENCOUNTER — Observation Stay (HOSPITAL_COMMUNITY): Payer: Medicare Other

## 2020-11-21 ENCOUNTER — Emergency Department (HOSPITAL_COMMUNITY): Payer: Medicare Other

## 2020-11-21 ENCOUNTER — Inpatient Hospital Stay (HOSPITAL_COMMUNITY)
Admission: EM | Admit: 2020-11-21 | Discharge: 2020-11-26 | DRG: 641 | Disposition: A | Discharge status: Home / Self Care | Payer: Medicare Other | Attending: Internal Medicine | Admitting: Internal Medicine

## 2020-11-21 ENCOUNTER — Encounter (HOSPITAL_COMMUNITY): Payer: Self-pay

## 2020-11-21 ENCOUNTER — Other Ambulatory Visit: Payer: Self-pay

## 2020-11-21 DIAGNOSIS — E871 Hypo-osmolality and hyponatremia: Secondary | ICD-10-CM | POA: Diagnosis present

## 2020-11-21 DIAGNOSIS — E041 Nontoxic single thyroid nodule: Secondary | ICD-10-CM | POA: Diagnosis not present

## 2020-11-21 DIAGNOSIS — R42 Dizziness and giddiness: Secondary | ICD-10-CM | POA: Diagnosis not present

## 2020-11-21 DIAGNOSIS — W19XXXA Unspecified fall, initial encounter: Secondary | ICD-10-CM | POA: Diagnosis present

## 2020-11-21 DIAGNOSIS — I10 Essential (primary) hypertension: Secondary | ICD-10-CM | POA: Diagnosis not present

## 2020-11-21 DIAGNOSIS — Z79899 Other long term (current) drug therapy: Secondary | ICD-10-CM | POA: Diagnosis not present

## 2020-11-21 DIAGNOSIS — R609 Edema, unspecified: Secondary | ICD-10-CM | POA: Diagnosis not present

## 2020-11-21 DIAGNOSIS — N179 Acute kidney failure, unspecified: Secondary | ICD-10-CM | POA: Diagnosis not present

## 2020-11-21 DIAGNOSIS — E669 Obesity, unspecified: Secondary | ICD-10-CM | POA: Diagnosis present

## 2020-11-21 DIAGNOSIS — Z96652 Presence of left artificial knee joint: Secondary | ICD-10-CM | POA: Diagnosis present

## 2020-11-21 DIAGNOSIS — K760 Fatty (change of) liver, not elsewhere classified: Secondary | ICD-10-CM | POA: Diagnosis not present

## 2020-11-21 DIAGNOSIS — Z20822 Contact with and (suspected) exposure to covid-19: Secondary | ICD-10-CM | POA: Diagnosis present

## 2020-11-21 DIAGNOSIS — Z90710 Acquired absence of both cervix and uterus: Secondary | ICD-10-CM

## 2020-11-21 DIAGNOSIS — M797 Fibromyalgia: Secondary | ICD-10-CM | POA: Diagnosis not present

## 2020-11-21 DIAGNOSIS — R0902 Hypoxemia: Secondary | ICD-10-CM | POA: Diagnosis not present

## 2020-11-21 DIAGNOSIS — R55 Syncope and collapse: Secondary | ICD-10-CM | POA: Diagnosis present

## 2020-11-21 DIAGNOSIS — Z90722 Acquired absence of ovaries, bilateral: Secondary | ICD-10-CM | POA: Diagnosis not present

## 2020-11-21 DIAGNOSIS — R197 Diarrhea, unspecified: Secondary | ICD-10-CM

## 2020-11-21 DIAGNOSIS — Z8249 Family history of ischemic heart disease and other diseases of the circulatory system: Secondary | ICD-10-CM

## 2020-11-21 DIAGNOSIS — I213 ST elevation (STEMI) myocardial infarction of unspecified site: Secondary | ICD-10-CM | POA: Diagnosis not present

## 2020-11-21 DIAGNOSIS — Z91040 Latex allergy status: Secondary | ICD-10-CM

## 2020-11-21 DIAGNOSIS — E876 Hypokalemia: Secondary | ICD-10-CM | POA: Diagnosis present

## 2020-11-21 DIAGNOSIS — Z9079 Acquired absence of other genital organ(s): Secondary | ICD-10-CM | POA: Diagnosis not present

## 2020-11-21 DIAGNOSIS — H3530 Unspecified macular degeneration: Secondary | ICD-10-CM | POA: Diagnosis not present

## 2020-11-21 DIAGNOSIS — E860 Dehydration: Principal | ICD-10-CM | POA: Diagnosis present

## 2020-11-21 DIAGNOSIS — I951 Orthostatic hypotension: Secondary | ICD-10-CM | POA: Diagnosis not present

## 2020-11-21 DIAGNOSIS — S199XXA Unspecified injury of neck, initial encounter: Secondary | ICD-10-CM | POA: Diagnosis not present

## 2020-11-21 DIAGNOSIS — K529 Noninfective gastroenteritis and colitis, unspecified: Secondary | ICD-10-CM | POA: Diagnosis not present

## 2020-11-21 DIAGNOSIS — D869 Sarcoidosis, unspecified: Secondary | ICD-10-CM | POA: Diagnosis present

## 2020-11-21 DIAGNOSIS — M47812 Spondylosis without myelopathy or radiculopathy, cervical region: Secondary | ICD-10-CM | POA: Diagnosis not present

## 2020-11-21 DIAGNOSIS — Z885 Allergy status to narcotic agent status: Secondary | ICD-10-CM

## 2020-11-21 DIAGNOSIS — Z6832 Body mass index (BMI) 32.0-32.9, adult: Secondary | ICD-10-CM | POA: Diagnosis not present

## 2020-11-21 DIAGNOSIS — I361 Nonrheumatic tricuspid (valve) insufficiency: Secondary | ICD-10-CM | POA: Diagnosis not present

## 2020-11-21 DIAGNOSIS — J301 Allergic rhinitis due to pollen: Secondary | ICD-10-CM | POA: Diagnosis not present

## 2020-11-21 DIAGNOSIS — S0990XA Unspecified injury of head, initial encounter: Secondary | ICD-10-CM | POA: Diagnosis not present

## 2020-11-21 LAB — RESP PANEL BY RT-PCR (FLU A&B, COVID) ARPGX2
Influenza A by PCR: NEGATIVE
Influenza B by PCR: NEGATIVE
SARS Coronavirus 2 by RT PCR: NEGATIVE

## 2020-11-21 LAB — COMPREHENSIVE METABOLIC PANEL
ALT: 23 U/L (ref 0–44)
AST: 37 U/L (ref 15–41)
Albumin: 4.5 g/dL (ref 3.5–5.0)
Alkaline Phosphatase: 85 U/L (ref 38–126)
Anion gap: 12 (ref 5–15)
BUN: 12 mg/dL (ref 8–23)
CO2: 22 mmol/L (ref 22–32)
Calcium: 9.6 mg/dL (ref 8.9–10.3)
Chloride: 95 mmol/L — ABNORMAL LOW (ref 98–111)
Creatinine, Ser: 1.11 mg/dL — ABNORMAL HIGH (ref 0.44–1.00)
GFR, Estimated: 54 mL/min — ABNORMAL LOW (ref >60)
Glucose, Bld: 102 mg/dL — ABNORMAL HIGH (ref 70–99)
Potassium: 2.8 mmol/L — ABNORMAL LOW (ref 3.5–5.1)
Sodium: 129 mmol/L — ABNORMAL LOW (ref 135–145)
Total Bilirubin: 0.7 mg/dL (ref 0.3–1.2)
Total Protein: 7.4 g/dL (ref 6.5–8.1)

## 2020-11-21 LAB — BASIC METABOLIC PANEL
Anion gap: 12 (ref 5–15)
BUN: 7 mg/dL — ABNORMAL LOW (ref 8–23)
CO2: 19 mmol/L — ABNORMAL LOW (ref 22–32)
Calcium: 9.2 mg/dL (ref 8.9–10.3)
Chloride: 105 mmol/L (ref 98–111)
Creatinine, Ser: 0.62 mg/dL (ref 0.44–1.00)
GFR, Estimated: >60 mL/min (ref >60)
Glucose, Bld: 102 mg/dL — ABNORMAL HIGH (ref 70–99)
Potassium: 3.3 mmol/L — ABNORMAL LOW (ref 3.5–5.1)
Sodium: 136 mmol/L (ref 135–145)

## 2020-11-21 LAB — URINALYSIS, ROUTINE W REFLEX MICROSCOPIC
Bilirubin Urine: NEGATIVE
Glucose, UA: NEGATIVE mg/dL
Hgb urine dipstick: NEGATIVE
Ketones, ur: NEGATIVE mg/dL
Leukocytes,Ua: NEGATIVE
Nitrite: NEGATIVE
Protein, ur: NEGATIVE mg/dL
Specific Gravity, Urine: 1.008 (ref 1.005–1.030)
pH: 6 (ref 5.0–8.0)

## 2020-11-21 LAB — C DIFFICILE QUICK SCREEN W PCR REFLEX
C Diff antigen: NEGATIVE
C Diff interpretation: NOT DETECTED
C Diff toxin: NEGATIVE

## 2020-11-21 LAB — CBC WITH DIFFERENTIAL/PLATELET
Abs Immature Granulocytes: 0.02 10*3/uL (ref 0.00–0.07)
Basophils Absolute: 0.1 10*3/uL (ref 0.0–0.1)
Basophils Relative: 1 %
Eosinophils Absolute: 0 10*3/uL (ref 0.0–0.5)
Eosinophils Relative: 0 %
HCT: 36.2 % (ref 36.0–46.0)
Hemoglobin: 13.1 g/dL (ref 12.0–15.0)
Immature Granulocytes: 0 %
Lymphocytes Relative: 11 %
Lymphs Abs: 0.5 10*3/uL — ABNORMAL LOW (ref 0.7–4.0)
MCH: 33.2 pg (ref 26.0–34.0)
MCHC: 36.2 g/dL — ABNORMAL HIGH (ref 30.0–36.0)
MCV: 91.6 fL (ref 80.0–100.0)
Monocytes Absolute: 1.1 10*3/uL — ABNORMAL HIGH (ref 0.1–1.0)
Monocytes Relative: 22 %
Neutro Abs: 3.3 10*3/uL (ref 1.7–7.7)
Neutrophils Relative %: 66 %
Platelets: 275 10*3/uL (ref 150–400)
RBC: 3.95 MIL/uL (ref 3.87–5.11)
RDW: 11 % — ABNORMAL LOW (ref 11.5–15.5)
WBC: 5 10*3/uL (ref 4.0–10.5)
nRBC: 0 % (ref 0.0–0.2)

## 2020-11-21 LAB — OSMOLALITY, URINE: Osmolality, Ur: 261 mOsm/kg — ABNORMAL LOW (ref 300–900)

## 2020-11-21 LAB — LIPASE, BLOOD: Lipase: 113 U/L — ABNORMAL HIGH (ref 11–51)

## 2020-11-21 LAB — SODIUM, URINE, RANDOM: Sodium, Ur: 49 mmol/L

## 2020-11-21 MED ORDER — POTASSIUM CHLORIDE 20 MEQ PO PACK
40.0000 meq | PACK | Freq: Once | ORAL | Status: AC
  Administered 2020-11-21: 09:00:00 40 meq via ORAL
  Filled 2020-11-21: qty 2

## 2020-11-21 MED ORDER — SODIUM CHLORIDE 0.9% FLUSH
3.0000 mL | Freq: Two times a day (BID) | INTRAVENOUS | Status: DC
  Administered 2020-11-21 – 2020-11-25 (×6): 3 mL via INTRAVENOUS

## 2020-11-21 MED ORDER — ONDANSETRON HCL 4 MG PO TABS: 4.0000 mg | ORAL_TABLET | Freq: Four times a day (QID) | ORAL | Status: DC | PRN

## 2020-11-21 MED ORDER — SODIUM CHLORIDE 0.9 % IV SOLN: Freq: Once | INTRAVENOUS | Status: AC

## 2020-11-21 MED ORDER — ENOXAPARIN SODIUM 40 MG/0.4ML ~~LOC~~ SOLN
40.0000 mg | SUBCUTANEOUS | Status: DC
  Administered 2020-11-21 – 2020-11-25 (×5): 40 mg via SUBCUTANEOUS
  Filled 2020-11-21 (×5): qty 0.4

## 2020-11-21 MED ORDER — SODIUM CHLORIDE 0.9 % IV BOLUS
1000.0000 mL | Freq: Once | INTRAVENOUS | Status: AC
  Administered 2020-11-21: 14:00:00 1000 mL via INTRAVENOUS

## 2020-11-21 MED ORDER — SODIUM CHLORIDE 0.9 % IV BOLUS
1000.0000 mL | Freq: Once | INTRAVENOUS | Status: AC
  Administered 2020-11-21: 09:00:00 1000 mL via INTRAVENOUS

## 2020-11-21 MED ORDER — ONDANSETRON HCL 4 MG/2ML IJ SOLN
4.0000 mg | Freq: Once | INTRAMUSCULAR | Status: AC
  Administered 2020-11-21: 09:00:00 4 mg via INTRAVENOUS
  Filled 2020-11-21: qty 2

## 2020-11-21 MED ORDER — ONDANSETRON HCL 4 MG/2ML IJ SOLN: 4.0000 mg | Freq: Four times a day (QID) | INTRAMUSCULAR | Status: DC | PRN

## 2020-11-21 MED ORDER — POTASSIUM CHLORIDE 10 MEQ/100ML IV SOLN
10.0000 meq | INTRAVENOUS | Status: AC
Start: 2020-11-21 — End: 2020-11-21
  Administered 2020-11-21 (×2): 10 meq via INTRAVENOUS
  Filled 2020-11-21 (×2): qty 100

## 2020-11-21 MED ORDER — POTASSIUM CHLORIDE CRYS ER 20 MEQ PO TBCR
40.0000 meq | EXTENDED_RELEASE_TABLET | Freq: Two times a day (BID) | ORAL | Status: DC
  Administered 2020-11-21: 21:00:00 40 meq via ORAL
  Filled 2020-11-21 (×2): qty 2
  Filled 2020-11-21: qty 4

## 2020-11-21 NOTE — ED Provider Notes (Signed)
[facility]-EMERGENCY DEPT Provider Note   CSN: [id number] Arrival date & time: 11/21/20  0720     History Chief Complaint  Patient presents with  . Loss of Consciousness    Kathy [name] is a 69 y.o. female.  69 y.o female with a PMH of HTN, Sarcoidosis presents to the ED via EMS s/p syncopal episode this morning. Patient reports getting up after using the toilet when she felt dizzy "like the room is spinning" and fell onto the tub of the bathroom. She reports loss of consciousness and unsure wether she struck her head. Patient reports feeling weak and ill for the past 5 days, she attributes this to taking an antibiotic for a urinary tract infection. The symptoms include abdominal pain, diarrhea and weakness. She also endorses one episode of vomiting prior to arrival into the ED. Also reported a frontal sharp headache, no alleviating or exacerbating factors. Denies any chest pain, shortness of breath, fever, or melena.    The history is provided by the patient.       Past Medical History:  Diagnosis Date  . Exotropia of left eye   . Fibromyalgia   . History of uterine leiomyoma   . Hypertension   . Macular degeneration   . OA (osteoarthritis)   . Overactive bladder   . Sarcoidosis   . Wears glasses     Patient Active Problem List   Diagnosis Date Noted  . Obese 06/16/2019  . S/P left TKA 06/15/2019  . Pain in left knee 02/08/2019  . Arthritis 10/28/2016  . Allergic rhinitis due to pollen 02/03/2016  . Exotropia 04/08/2015  . Macular degeneration 04/27/2012  . Fibromyalgia 02/22/2012  . SARCOIDOSIS 07/09/2010  . Essential hypertension 07/09/2010    Past Surgical History:  Procedure Laterality Date  . ABDOMINAL HYSTERECTOMY  2013  . ADJUSTABLE SUTURE MANIPULATION Left 04/10/2015   Procedure: ADJUSTABLE SUTURE MANIPULATION;  Surgeon: Aura Camps, MD;  Location: [facility];  Service: Ophthalmology;  Laterality: Left;  .  EYE SURGERY    . MEDIAN RECTUS REPAIR Left 04/10/2015   Procedure: MEDIAN RECTUS RESECTION LATERAL RECTUS RECESSION,AJUSTABLES SUTURES LEFT EYE;  Surgeon: Aura Camps, MD;  Location: [facility];  Service: Ophthalmology;  Laterality: Left;  . TONSILLECTOMY  as child  . TOTAL ABDOMINAL HYSTERECTOMY W/ BILATERAL SALPINGOOPHORECTOMY  10-14-2010   and TVT midurethral sling and culdoplasty  . TOTAL KNEE ARTHROPLASTY Left 06/15/2019   Procedure: TOTAL KNEE ARTHROPLASTY;  Surgeon: Durene Romans, MD;  Location: [facility];  Service: Orthopedics;  Laterality: Left;  70 mins     OB History   No obstetric history on file.     Family History  Problem Relation Age of Onset  . Colon cancer Mother   . Diabetes Father   . Diabetes Sister   . Hypertension Sister   . Diabetes Sister   . Hypertension Sister   . Hypertension Sister     Social History   Tobacco Use  . Smoking status: Never Smoker  . Smokeless tobacco: Never Used  Vaping Use  . Vaping Use: Never used  Substance Use Topics  . Alcohol use: Yes    Alcohol/week: 1.0 standard drink    Types: 1 Glasses of wine per week    Comment: rare  . Drug use: No    Home Medications Prior to Admission medications   Medication Sig Start Date End Date Taking? Authorizing Provider  acetaminophen (TYLENOL) 500 MG tablet Take 2 tablets (1,000  mg total) by mouth every 8 (eight) hours. 06/16/19   Lanney Gins, PA-C  cycloSPORINE (RESTASIS) 0.05 % ophthalmic emulsion Place 1 drop into both eyes 2 (two) times daily.    [provider]  diclofenac Sodium (VOLTAREN) 1 % GEL APPLY 2 GRAMS TO AFFECTED AREA 4 TIMES A DAY 06/25/20   Vivi Barrack, DPM  docusate sodium (COLACE) 100 MG capsule Take 1 capsule (100 mg total) by mouth 2 (two) times daily. Patient not taking: Reported on 11/11/2020 06/16/19   Lanney Gins, PA-C  ferrous sulfate (FERROUSUL) 325 (65 FE) MG tablet Take 1 tablet (325 mg total) by mouth 3 (three) times  daily with meals for 14 days. Patient not taking: Reported on 08/31/2019 06/16/19 06/30/19  Lanney Gins, PA-C  HYDROmorphone (DILAUDID) 2 MG tablet TAKE 1 2 BY MOUTH EVERY 4 HOURS AS NEEDED FOR PAIN**INS MAX 8 TABS/DAY Patient not taking: No sig reported 06/20/19   [provider]  meloxicam (MOBIC) 7.5 MG tablet Take 1 tablet (7.5 mg total) by mouth daily. 09/02/20   Ronnald Nian, MD  methocarbamol (ROBAXIN) 500 MG tablet Take 1 tablet (500 mg total) by mouth every 6 (six) hours as needed for muscle spasms. Patient not taking: No sig reported 06/16/19   Lanney Gins, PA-C  Multiple Vitamins-Minerals (PRESERVISION AREDS 2) CAPS Take 1 capsule by mouth 2 (two) times a day.    [provider]  ofloxacin (OCUFLOX) 0.3 % ophthalmic solution 1 DROP 3 TIMES A DAY IN OPERATIVE EYE START 2 DAYS PRIOR TO SURGERY AND AFTER SURGERY X3 WEEKS. Patient not taking: No sig reported 05/22/19   [provider]  Olmesartan-amLODIPine-HCTZ 40-10-12.5 MG TABS Take 1 tablet by mouth daily. 09/02/20   Ronnald Nian, MD  Omega-3 Fatty Acids (FISH OIL) 1000 MG CAPS omega-3 acid ethyl esters 1 gram capsule   1  by oral route.    [provider]  polyethylene glycol (MIRALAX / GLYCOLAX) 17 g packet Take 17 g by mouth 2 (two) times daily. Patient not taking: No sig reported 06/16/19   Lanney Gins, PA-C  prednisoLONE acetate (PRED FORTE) 1 % ophthalmic suspension PLEASE SEE ATTACHED FOR DETAILED DIRECTIONS Patient not taking: No sig reported 05/24/19   [provider]  Sodium Chloride Flush (NORMAL SALINE FLUSH) 0.9 % SOLN sodium chloride 0.9 % intravenous solution   500 mL by intraven. route. 12/14/16   [provider]  sulfamethoxazole-trimethoprim (BACTRIM DS) 800-160 MG tablet Take 1 tablet by mouth 2 (two) times daily. 11/11/20   Ronnald Nian, MD    Allergies    Lortab [hydrocodone-acetaminophen] and Latex  Review of Systems   Review of Systems   Constitutional: Negative for chills and fever.  Respiratory: Negative for shortness of breath.   Cardiovascular: Negative for chest pain.  Gastrointestinal: Positive for abdominal pain, diarrhea, nausea and vomiting. Negative for blood in stool.  Genitourinary: Negative for menstrual problem.  Musculoskeletal: Negative for back pain and myalgias.  Neurological: Positive for syncope, weakness and headaches.  All other systems reviewed and are negative.   Physical Exam Updated Vital Signs BP 123/71   Pulse 81   Temp 98.2 F (36.8 C) (Oral)   Resp 15   Ht 5\' 2"  (1.575 m)   Wt 79.4 kg   SpO2 99%   BMI 32.01 kg/m   Physical Exam Vitals and nursing note reviewed.  Constitutional:      Appearance: Normal appearance. She is not ill-appearing.  HENT:  Head: Normocephalic and atraumatic.     Comments: No signs of bruising, lacerations or abrasions to face or head.    Mouth/Throat:     Mouth: Mucous membranes are moist.  Eyes:     Pupils: Pupils are equal, round, and reactive to light.  Cardiovascular:     Rate and Rhythm: Normal rate.     Comments: No calf tenderness not pitting edema.  Pulmonary:     Effort: Pulmonary effort is normal.     Breath sounds: No wheezing or rales.     Comments: Lungs are clear to auscultation.  Abdominal:     General: Abdomen is flat.     Palpations: Abdomen is soft.     Tenderness: There is generalized abdominal tenderness. There is no right CVA tenderness or left CVA tenderness. Negative signs include Murphy's sign and McBurney's sign.  Musculoskeletal:     Cervical back: Normal range of motion and neck supple.     Right lower leg: No edema.     Left lower leg: No edema.  Skin:    General: Skin is warm and dry.  Neurological:     Mental Status: She is alert and oriented to person, place, and time.     ED Results / Procedures / Treatments   Labs (all labs ordered are listed, but only abnormal results are displayed) Labs Reviewed   CBC WITH DIFFERENTIAL/PLATELET - Abnormal; Notable for the following components:      Result Value   MCHC 36.2 (*)    RDW 11.0 (*)    Lymphs Abs 0.5 (*)    Monocytes Absolute 1.1 (*)    All other components within normal limits  COMPREHENSIVE METABOLIC PANEL - Abnormal; Notable for the following components:   Sodium 129 (*)    Potassium 2.8 (*)    Chloride 95 (*)    Glucose, Bld 102 (*)    Creatinine, Ser 1.11 (*)    GFR, Estimated 54 (*)    All other components within normal limits  LIPASE, BLOOD - Abnormal; Notable for the following components:   Lipase 113 (*)    All other components within normal limits  URINE CULTURE  C DIFFICILE QUICK SCREEN W PCR REFLEX  RESP PANEL BY RT-PCR (FLU A&B, COVID) [id number]  URINALYSIS, ROUTINE W REFLEX MICROSCOPIC    EKG EKG Interpretation  Date/Time:  Thursday November 21 2020 07:56:53 EST Ventricular Rate:  75 PR Interval:    QRS Duration: 94 QT Interval:  406 QTC Calculation: 454 R Axis:   8 Text Interpretation: Sinus rhythm Low voltage, precordial leads Borderline T abnormalities, anterior leads Confirmed by Norman Clay ([id number]) on 11/21/2020 8:27:16 AM   Radiology CT Head Wo Contrast  Result Date: 11/21/2020 CLINICAL DATA:  Syncopal episode today with trauma to the head and neck. EXAM: CT HEAD WITHOUT CONTRAST CT CERVICAL SPINE WITHOUT CONTRAST TECHNIQUE: Multidetector CT imaging of the head and cervical spine was performed following the standard protocol without intravenous contrast. Multiplanar CT image reconstructions of the cervical spine were also generated. COMPARISON:  None. FINDINGS: CT HEAD FINDINGS Brain: No sign of acute infarction. Mild chronic small-vessel ischemic change of the white matter. No mass, hemorrhage, hydrocephalus or extra-axial collection. Vascular: There is atherosclerotic calcification of the major vessels at the base of the brain. Skull: Negative Sinuses/Orbits: Clear/normal Other: None CT CERVICAL SPINE  FINDINGS Alignment: Normal Skull base and vertebrae: No fracture or primary bone lesion. Soft tissues and spinal canal: No significant soft tissue finding. 12 mm  cyst or nodule right thyroid lobe. Disc levels: Facet osteoarthritis on the left at C3-4 and C4-5. Degenerative spondylosis at C4-5, C5-6 and C6-7. Mild foraminal narrowing on the left due to osteophytic encroachment at C4-5, C5-6 and C6-7. Minimal encroachment on the right. Upper chest: Negative except for pulmonary scarring. Other: None IMPRESSION: HEAD CT: No acute or traumatic finding. Mild chronic small-vessel ischemic changes of the white matter. CERVICAL SPINE CT: 1. No acute or traumatic finding. Ordinary degenerative spondylosis and facet osteoarthritis. Foraminal narrowing on the left at C4-5, C5-6 and C6-7. 2. 12 mm cyst or nodule right thyroid lobe. No followup recommended (ref: J Am Coll Radiol. 2015 Feb;12(2): 143-50). Electronically Signed   By: Nelson Chimes M.D.   On: 11/21/2020 08:59   CT Cervical Spine Wo Contrast  Result Date: 11/21/2020 CLINICAL DATA:  Syncopal episode today with trauma to the head and neck. EXAM: CT HEAD WITHOUT CONTRAST CT CERVICAL SPINE WITHOUT CONTRAST TECHNIQUE: Multidetector CT imaging of the head and cervical spine was performed following the standard protocol without intravenous contrast. Multiplanar CT image reconstructions of the cervical spine were also generated. COMPARISON:  None. FINDINGS: CT HEAD FINDINGS Brain: No sign of acute infarction. Mild chronic small-vessel ischemic change of the white matter. No mass, hemorrhage, hydrocephalus or extra-axial collection. Vascular: There is atherosclerotic calcification of the major vessels at the base of the brain. Skull: Negative Sinuses/Orbits: Clear/normal Other: None CT CERVICAL SPINE FINDINGS Alignment: Normal Skull base and vertebrae: No fracture or primary bone lesion. Soft tissues and spinal canal: No significant soft tissue finding. 12 mm cyst or  nodule right thyroid lobe. Disc levels: Facet osteoarthritis on the left at C3-4 and C4-5. Degenerative spondylosis at C4-5, C5-6 and C6-7. Mild foraminal narrowing on the left due to osteophytic encroachment at C4-5, C5-6 and C6-7. Minimal encroachment on the right. Upper chest: Negative except for pulmonary scarring. Other: None IMPRESSION: HEAD CT: No acute or traumatic finding. Mild chronic small-vessel ischemic changes of the white matter. CERVICAL SPINE CT: 1. No acute or traumatic finding. Ordinary degenerative spondylosis and facet osteoarthritis. Foraminal narrowing on the left at C4-5, C5-6 and C6-7. 2. 12 mm cyst or nodule right thyroid lobe. No followup recommended (ref: J Am Coll Radiol. 2015 Feb;12(2): 143-50). Electronically Signed   By: Nelson Chimes M.D.   On: 11/21/2020 08:59    Procedures .Critical Care Performed by: Janeece Fitting, PA-C Authorized by: Janeece Fitting, PA-C   Critical care provider statement:    Critical care time (minutes):  60   Critical care start time:  11/21/2020 7:30 AM   Critical care end time:  11/21/2020 8:30 AM   Critical care time was exclusive of:  Separately billable procedures and treating other patients   Critical care was necessary to treat or prevent imminent or life-threatening deterioration of the following conditions:  Metabolic crisis   Critical care was time spent personally by me on the following activities:  Blood draw for specimens, development of treatment plan with patient or surrogate, discussions with consultants, evaluation of patient's response to treatment, examination of patient, obtaining history from patient or surrogate, ordering and performing treatments and interventions, ordering and review of laboratory studies, ordering and review of radiographic studies, pulse oximetry, re-evaluation of patient's condition and review of old charts   (including critical care time)  Medications Ordered in ED Medications  sodium chloride 0.9 %  bolus 1,000 mL (has no administration in time range)  sodium chloride 0.9 % bolus 1,000 mL (0 mLs  Intravenous Stopped 11/21/20 1024)  potassium chloride 10 mEq in 100 mL IVPB (10 mEq Intravenous New Bag/Given 11/21/20 1022)  ondansetron (ZOFRAN) injection 4 mg (4 mg Intravenous Given 11/21/20 0925)  potassium chloride (KLOR-CON) packet 40 mEq (40 mEq Oral Given 11/21/20 4270)    ED Course  I have reviewed the triage vital signs and the nursing notes.  Pertinent labs & imaging results that were available during my care of the patient were reviewed by me and considered in my medical decision making (see chart for details).  Clinical Course as of 11/21/20 1242  Thu Nov 21, 2020  0846 Potassium(!): 2.8 IV replacement ordered while in the ED [JS]    Clinical Course User Index [JS] Janeece Fitting, PA-C   MDM Rules/Calculators/A&P   Patient with a PMH of HTN presents to the ED via ems s/p syncopal episode after voiding this morning. Patient endorses feeling ill for the past week after starting antibiotic for UTI. Chart review shows patient was placed on bactrim for UTI treatment on 11/11/2020, she reports almost completing the treatment.   Interpretation of her labs by me, with a CMP remarkable for mild hyponatremia, hypokalemia. Potassium at 2.8, she does endorses multiple episodes of diarrhea following the antibiotic therapy suspect hypokalemia due to GI loss. Creatine level is slightly elevated from her baseline, will provide patient with fluids to help with mild AKI. CBC with no leukocytosis, no drop no her hemoglobin. Lipase level is elevated but no prior lab result for comparison. She is not a diabetic, there no pain on the upper abdomen lower suspicion for pancreatitis. UA is negative as patient is on last day of antibiotic therapy. Urine culture sent.  Head CT: Without any acute abnormality.   CT Cervical spine: 1. No acute or traumatic finding. Ordinary degenerative spondylosis  and  facet osteoarthritis. Foraminal narrowing on the left at C4-5,  C5-6 and C6-7.  2. 12 mm cyst or nodule right thyroid lobe. No followup recommended  (ref: J Am Coll Radiol. 2015 Feb;12(2): 143-50).     12:28 PM patient reevaluated by me, reports no improvement in her symptoms, reports feeling overall weak, has continued to have multiple episodes of diarrhea.  Vitals remained stable.  A C. difficile panel has been ordered for patient.  12:40 PM spoke to hospitalist service who will admit patient for further management of hypokalemia.  Vitals remained stable.  She is stable for admission at this time.   Portions of this note were generated with [employer]. Dictation errors may occur despite best attempts at proofreading.  Final Clinical Impression(s) / ED Diagnoses Final diagnoses:  Syncope, unspecified syncope type  Hypokalemia  Diarrhea, unspecified type    Rx / DC Orders ED Discharge Orders    None       Janeece Fitting, Hershal Coria 11/21/20 [place], MD 11/27/20 1601

## 2020-11-21 NOTE — ED Notes (Signed)
Fall Bundle: Fall risk armband Bed alarm (on) Yellow socks Fall risk sign outside door

## 2020-11-21 NOTE — ED Triage Notes (Signed)
Pt BIB by [facility] for syncopal episode with fall at home this morning. C/o dizziness for a week. NVD and headache x6 days. Denies fever. Seen recently for UTI. Visible deformity to both wrists. Pt landed on right side. No blood thinners.  22 LH  BP 134/70 HR 73 RR 16 SpO2 98% RA Temp 97.4 CBG 155

## 2020-11-21 NOTE — ED Notes (Signed)
Purewick enternal catheter attached to patient

## 2020-11-21 NOTE — H&P (Signed)
History and Physical    SESEN [name] [id number] DOB: [birth year] DOA: 11/21/2020  PCP: Denita Lung, MD  Patient coming from: Home  Chief Complaint: "I passed out."  HPI: Kathy [name] is a 69 y.o. female with medical history significant of HTN, OA. Presenting after a syncopal episode with fall this morning. She reports that she was seen by her PCP on 12/14 for a UTI. She was prescribed abx and had been taking them as prescribed. She notes that she started having HA, stomach pain and vomiting a few days later. She took pepto bismol and APAP. Her symptoms have improved but not completely resolved. She decided that she would just deal with the symptoms. This morning she got up to go to the bathroom and was generally feeling the same way she has over the last several days. She had a BM and got up to return to bed. As she got up, she felt lightheaded and fell. She does not remember the fall. She woke up and called for her son to help. EMS was contacted and she was brought to the ED.   ED Course: She was found to be hypokalemia and hyponatremic. CT imaging was negative for acute processes. She was given fluids and potassium TRH was called for admission.   Review of Systems:  Denies CP, dyspnea, palpitations, seizure-like episodes, incontinence. Review of systems is otherwise negative for all not mentioned in HPI.   PMHx Past Medical History:  Diagnosis Date  . Exotropia of left eye   . Fibromyalgia   . History of uterine leiomyoma   . Hypertension   . Macular degeneration   . OA (osteoarthritis)   . Overactive bladder   . Sarcoidosis   . Wears glasses     PSHx Past Surgical History:  Procedure Laterality Date  . ABDOMINAL HYSTERECTOMY  2013  . ADJUSTABLE SUTURE MANIPULATION Left 04/10/2015   Procedure: ADJUSTABLE SUTURE MANIPULATION;  Surgeon: Gevena Cotton, MD;  Location: [facility];  Service: Ophthalmology;  Laterality: Left;  . EYE SURGERY    .  MEDIAN RECTUS REPAIR Left 04/10/2015   Procedure: MEDIAN RECTUS RESECTION LATERAL RECTUS RECESSION,AJUSTABLES SUTURES LEFT EYE;  Surgeon: Gevena Cotton, MD;  Location: [facility];  Service: Ophthalmology;  Laterality: Left;  . TONSILLECTOMY  as child  . TOTAL ABDOMINAL HYSTERECTOMY W/ BILATERAL SALPINGOOPHORECTOMY  10-14-2010   and TVT midurethral sling and culdoplasty  . TOTAL KNEE ARTHROPLASTY Left 06/15/2019   Procedure: TOTAL KNEE ARTHROPLASTY;  Surgeon: Paralee Cancel, MD;  Location: [facility];  Service: Orthopedics;  Laterality: Left;  70 mins    SocHx  reports that she has never smoked. She has never used smokeless tobacco. She reports current alcohol use of about 1.0 standard drink of alcohol per week. She reports that she does not use drugs.  Allergies  Allergen Reactions  . Lortab [Hydrocodone-Acetaminophen] Nausea And Vomiting  . Latex Other (See Comments)    Pt unsure of reaction!    FamHx Family History  Problem Relation Age of Onset  . Colon cancer Mother   . Diabetes Father   . Diabetes Sister   . Hypertension Sister   . Diabetes Sister   . Hypertension Sister   . Hypertension Sister     Prior to Admission medications   Medication Sig Start Date End Date Taking? Authorizing Provider  acetaminophen (TYLENOL) 500 MG tablet Take 2 tablets (1,000 mg total) by mouth every 8 (eight) hours. 06/16/19   Danae Orleans, PA-C  cycloSPORINE (RESTASIS) 0.05 % ophthalmic emulsion Place 1 drop into both eyes 2 (two) times daily.    [provider]  diclofenac Sodium (VOLTAREN) 1 % GEL APPLY 2 GRAMS TO AFFECTED AREA 4 TIMES A DAY 06/25/20   Trula Slade, DPM  docusate sodium (COLACE) 100 MG capsule Take 1 capsule (100 mg total) by mouth 2 (two) times daily. Patient not taking: Reported on 11/11/2020 06/16/19   Danae Orleans, PA-C  ferrous sulfate (FERROUSUL) 325 (65 FE) MG tablet Take 1 tablet (325 mg total) by mouth 3 (three) times daily with meals for  14 days. Patient not taking: Reported on 08/31/2019 06/16/19 06/30/19  Danae Orleans, PA-C  HYDROmorphone (DILAUDID) 2 MG tablet TAKE 1 2 BY MOUTH EVERY 4 HOURS AS NEEDED FOR PAIN**INS MAX 8 TABS/DAY Patient not taking: No sig reported 06/20/19   [provider]  meloxicam (MOBIC) 7.5 MG tablet Take 1 tablet (7.5 mg total) by mouth daily. 09/02/20   Denita Lung, MD  methocarbamol (ROBAXIN) 500 MG tablet Take 1 tablet (500 mg total) by mouth every 6 (six) hours as needed for muscle spasms. Patient not taking: No sig reported 06/16/19   Danae Orleans, PA-C  Multiple Vitamins-Minerals (PRESERVISION AREDS 2) CAPS Take 1 capsule by mouth 2 (two) times a day.    [provider]  ofloxacin (OCUFLOX) 0.3 % ophthalmic solution 1 DROP 3 TIMES A DAY IN OPERATIVE EYE START 2 DAYS PRIOR TO SURGERY AND AFTER SURGERY X3 WEEKS. Patient not taking: No sig reported 05/22/19   [provider]  Olmesartan-amLODIPine-HCTZ 40-10-12.5 MG TABS Take 1 tablet by mouth daily. 09/02/20   Denita Lung, MD  Omega-3 Fatty Acids (FISH OIL) 1000 MG CAPS omega-3 acid ethyl esters 1 gram capsule   1 capsule by oral route.    [provider]  polyethylene glycol (MIRALAX / GLYCOLAX) 17 g packet Take 17 g by mouth 2 (two) times daily. Patient not taking: No sig reported 06/16/19   Danae Orleans, PA-C  prednisoLONE acetate (PRED FORTE) 1 % ophthalmic suspension PLEASE SEE ATTACHED FOR DETAILED DIRECTIONS Patient not taking: No sig reported 05/24/19   [provider]  Sodium Chloride Flush (NORMAL SALINE FLUSH) 0.9 % SOLN sodium chloride 0.9 % intravenous solution   500 mL by intraven. route. 12/14/16   [provider]  sulfamethoxazole-trimethoprim (BACTRIM DS) 800-160 MG tablet Take 1 tablet by mouth 2 (two) times daily. 11/11/20   Denita Lung, MD    Physical Exam: Vitals:   11/21/20 1130 11/21/20 1145 11/21/20 1200 11/21/20 1206  BP: (!) 109/56  123/71 123/71  Pulse:   81  81  Resp: 15 (!) 21 15 15   Temp:      TempSrc:      SpO2:  99%  99%  Weight:      Height:        General: 69 y.o. female resting in bed in NAD Eyes: PERRL, normal sclera ENMT: Nares patent w/o discharge, orophaynx clear, dentition normal, ears w/o discharge/lesions/ulcers Neck: Supple, trachea midline Cardiovascular: RRR, +S1, S2, no m/g/r, equal pulses throughout Respiratory: CTABL, no w/r/r, normal WOB GI: BS+, ND, soft. Mild TTP RLQ, no masses noted, no organomegaly noted MSK: No e/c/c Skin: No rashes, bruises, ulcerations noted Neuro: A&O x 3, no focal deficits Psyc: Appropriate interaction but flat affect, calm/cooperative  Labs on Admission: I have personally reviewed following labs and imaging studies  CBC: Recent Labs  Lab 11/21/20 0806  WBC 5.0  NEUTROABS  3.3  HGB 13.1  HCT 36.2  MCV 91.6  PLT 628   Basic Metabolic Panel: Recent Labs  Lab 11/21/20 0806  NA 129*  K 2.8*  CL 95*  CO2 22  GLUCOSE 102*  BUN 12  CREATININE 1.11*  CALCIUM 9.6   GFR: Estimated Creatinine Clearance: 46.7 mL/min (A) (by C-G formula based on SCr of 1.11 mg/dL (H)). Liver Function Tests: Recent Labs  Lab 11/21/20 0806  AST 37  ALT 23  ALKPHOS 85  BILITOT 0.7  PROT 7.4  ALBUMIN 4.5   Recent Labs  Lab 11/21/20 0806  LIPASE 113*   No results for input(s): AMMONIA in the last 168 hours. Coagulation Profile: No results for input(s): INR, PROTIME in the last 168 hours. Cardiac Enzymes: No results for input(s): CKTOTAL, CKMB, CKMBINDEX, TROPONINI in the last 168 hours. BNP (last 3 results) No results for input(s): PROBNP in the last 8760 hours. HbA1C: No results for input(s): HGBA1C in the last 72 hours. CBG: No results for input(s): GLUCAP in the last 168 hours. Lipid Profile: No results for input(s): CHOL, HDL, LDLCALC, TRIG, CHOLHDL, LDLDIRECT in the last 72 hours. Thyroid Function Tests: No results for input(s): TSH, T4TOTAL, FREET4, T3FREE, THYROIDAB in  the last 72 hours. Anemia Panel: No results for input(s): VITAMINB12, FOLATE, FERRITIN, TIBC, IRON, RETICCTPCT in the last 72 hours. Urine analysis:    Component Value Date/Time   COLORURINE YELLOW 11/21/2020 0818   APPEARANCEUR CLEAR 11/21/2020 0818   LABSPEC 1.008 11/21/2020 0818   LABSPEC 1.020 11/11/2020 1657   PHURINE 6.0 11/21/2020 0818   GLUCOSEU NEGATIVE 11/21/2020 0818   HGBUR NEGATIVE 11/21/2020 0818   BILIRUBINUR NEGATIVE 11/21/2020 0818   BILIRUBINUR negative 11/11/2020 1657   BILIRUBINUR n 04/13/2017 0932   KETONESUR NEGATIVE 11/21/2020 0818   PROTEINUR NEGATIVE 11/21/2020 0818   UROBILINOGEN negative (A) 04/13/2017 0932   UROBILINOGEN 1.0 05/01/2012 1123   NITRITE NEGATIVE 11/21/2020 0818   LEUKOCYTESUR NEGATIVE 11/21/2020 0818    Radiological Exams on Admission: CT Head Wo Contrast  Result Date: 11/21/2020 CLINICAL DATA:  Syncopal episode today with trauma to the head and neck. EXAM: CT HEAD WITHOUT CONTRAST CT CERVICAL SPINE WITHOUT CONTRAST TECHNIQUE: Multidetector CT imaging of the head and cervical spine was performed following the standard protocol without intravenous contrast. Multiplanar CT image reconstructions of the cervical spine were also generated. COMPARISON:  None. FINDINGS: CT HEAD FINDINGS Brain: No sign of acute infarction. Mild chronic small-vessel ischemic change of the white matter. No mass, hemorrhage, hydrocephalus or extra-axial collection. Vascular: There is atherosclerotic calcification of the major vessels at the base of the brain. Skull: Negative Sinuses/Orbits: Clear/normal Other: None CT CERVICAL SPINE FINDINGS Alignment: Normal Skull base and vertebrae: No fracture or primary bone lesion. Soft tissues and spinal canal: No significant soft tissue finding. 12 mm cyst or nodule right thyroid lobe. Disc levels: Facet osteoarthritis on the left at C3-4 and C4-5. Degenerative spondylosis at C4-5, C5-6 and C6-7. Mild foraminal narrowing on the left  due to osteophytic encroachment at C4-5, C5-6 and C6-7. Minimal encroachment on the right. Upper chest: Negative except for pulmonary scarring. Other: None IMPRESSION: HEAD CT: No acute or traumatic finding. Mild chronic small-vessel ischemic changes of the white matter. CERVICAL SPINE CT: 1. No acute or traumatic finding. Ordinary degenerative spondylosis and facet osteoarthritis. Foraminal narrowing on the left at C4-5, C5-6 and C6-7. 2. 12 mm cyst or nodule right thyroid lobe. No followup recommended (ref: J Am Coll Radiol. 2015 Feb;12(2): 143-50).  Electronically Signed   By: Nelson Chimes M.D.   On: 11/21/2020 08:59   CT Cervical Spine Wo Contrast  Result Date: 11/21/2020 CLINICAL DATA:  Syncopal episode today with trauma to the head and neck. EXAM: CT HEAD WITHOUT CONTRAST CT CERVICAL SPINE WITHOUT CONTRAST TECHNIQUE: Multidetector CT imaging of the head and cervical spine was performed following the standard protocol without intravenous contrast. Multiplanar CT image reconstructions of the cervical spine were also generated. COMPARISON:  None. FINDINGS: CT HEAD FINDINGS Brain: No sign of acute infarction. Mild chronic small-vessel ischemic change of the white matter. No mass, hemorrhage, hydrocephalus or extra-axial collection. Vascular: There is atherosclerotic calcification of the major vessels at the base of the brain. Skull: Negative Sinuses/Orbits: Clear/normal Other: None CT CERVICAL SPINE FINDINGS Alignment: Normal Skull base and vertebrae: No fracture or primary bone lesion. Soft tissues and spinal canal: No significant soft tissue finding. 12 mm cyst or nodule right thyroid lobe. Disc levels: Facet osteoarthritis on the left at C3-4 and C4-5. Degenerative spondylosis at C4-5, C5-6 and C6-7. Mild foraminal narrowing on the left due to osteophytic encroachment at C4-5, C5-6 and C6-7. Minimal encroachment on the right. Upper chest: Negative except for pulmonary scarring. Other: None IMPRESSION: HEAD  CT: No acute or traumatic finding. Mild chronic small-vessel ischemic changes of the white matter. CERVICAL SPINE CT: 1. No acute or traumatic finding. Ordinary degenerative spondylosis and facet osteoarthritis. Foraminal narrowing on the left at C4-5, C5-6 and C6-7. 2. 12 mm cyst or nodule right thyroid lobe. No followup recommended (ref: J Am Coll Radiol. 2015 Feb;12(2): 143-50). Electronically Signed   By: Nelson Chimes M.D.   On: 11/21/2020 08:59    EKG: Independently reviewed. Sinus, no st elevations  Assessment/Plan Syncopal episode w/ fall     - admit to obs, tele     - EKG ok, orthostatics ok     - no focality on exam, no acute findings on [facility]     - she has received fluids boluses in ED     - check echo     - PT/OT  Hyponatremia Hypokalemia     - check Uosm, Una+     - given IV/PO K+ in ED, continue; check Mg2+  AKI     - baseline Scr is 1.7. She is 1.1 today.     - UA is benign     - fluids, follow UOP, US renal  DVT prophylaxis: lovenox  Code Status: FULL  Family Communication: None at bedside.   Consults called: None.   Status is: Observation  The patient remains OBS appropriate and will d/c before 2 midnights.  Dispo: The patient is from: Home              Anticipated d/c is to: Home              Anticipated d/c date is: 1 day              Patient currently is not medically stable to d/c.  Jonnie Finner DO [facility]  If 7PM-7AM, please contact night-coverage [email or web address]  11/21/2020, 1:06 PM

## 2020-11-21 NOTE — ED Notes (Signed)
Patient's son at bedside.

## 2020-11-22 ENCOUNTER — Observation Stay (HOSPITAL_COMMUNITY): Payer: Medicare Other

## 2020-11-22 DIAGNOSIS — R197 Diarrhea, unspecified: Secondary | ICD-10-CM

## 2020-11-22 DIAGNOSIS — E876 Hypokalemia: Secondary | ICD-10-CM | POA: Diagnosis not present

## 2020-11-22 DIAGNOSIS — R55 Syncope and collapse: Secondary | ICD-10-CM

## 2020-11-22 DIAGNOSIS — K529 Noninfective gastroenteritis and colitis, unspecified: Secondary | ICD-10-CM | POA: Diagnosis not present

## 2020-11-22 DIAGNOSIS — N179 Acute kidney failure, unspecified: Secondary | ICD-10-CM | POA: Diagnosis not present

## 2020-11-22 DIAGNOSIS — I361 Nonrheumatic tricuspid (valve) insufficiency: Secondary | ICD-10-CM

## 2020-11-22 DIAGNOSIS — K760 Fatty (change of) liver, not elsewhere classified: Secondary | ICD-10-CM | POA: Diagnosis not present

## 2020-11-22 LAB — COMPREHENSIVE METABOLIC PANEL
ALT: 22 U/L (ref 0–44)
AST: 31 U/L (ref 15–41)
Albumin: 4.1 g/dL (ref 3.5–5.0)
Alkaline Phosphatase: 79 U/L (ref 38–126)
Anion gap: 13 (ref 5–15)
BUN: 7 mg/dL — ABNORMAL LOW (ref 8–23)
CO2: 17 mmol/L — ABNORMAL LOW (ref 22–32)
Calcium: 8.9 mg/dL (ref 8.9–10.3)
Chloride: 106 mmol/L (ref 98–111)
Creatinine, Ser: 0.55 mg/dL (ref 0.44–1.00)
GFR, Estimated: >60 mL/min (ref >60)
Glucose, Bld: 93 mg/dL (ref 70–99)
Potassium: 3.4 mmol/L — ABNORMAL LOW (ref 3.5–5.1)
Sodium: 136 mmol/L (ref 135–145)
Total Bilirubin: 0.5 mg/dL (ref 0.3–1.2)
Total Protein: 6.8 g/dL (ref 6.5–8.1)

## 2020-11-22 LAB — CBC WITH DIFFERENTIAL/PLATELET
Abs Immature Granulocytes: 0.02 10*3/uL (ref 0.00–0.07)
Basophils Absolute: 0 10*3/uL (ref 0.0–0.1)
Basophils Relative: 1 %
Eosinophils Absolute: 0 10*3/uL (ref 0.0–0.5)
Eosinophils Relative: 1 %
HCT: 37.6 % (ref 36.0–46.0)
Hemoglobin: 12.8 g/dL (ref 12.0–15.0)
Immature Granulocytes: 1 %
Lymphocytes Relative: 20 %
Lymphs Abs: 0.8 10*3/uL (ref 0.7–4.0)
MCH: 32.4 pg (ref 26.0–34.0)
MCHC: 34 g/dL (ref 30.0–36.0)
MCV: 95.2 fL (ref 80.0–100.0)
Monocytes Absolute: 1.3 10*3/uL — ABNORMAL HIGH (ref 0.1–1.0)
Monocytes Relative: 31 %
Neutro Abs: 2 10*3/uL (ref 1.7–7.7)
Neutrophils Relative %: 46 %
Platelets: 243 10*3/uL (ref 150–400)
RBC: 3.95 MIL/uL (ref 3.87–5.11)
RDW: 11.8 % (ref 11.5–15.5)
WBC: 4.2 10*3/uL (ref 4.0–10.5)
nRBC: 0 % (ref 0.0–0.2)

## 2020-11-22 LAB — GLUCOSE, CAPILLARY: Glucose-Capillary: 81 mg/dL (ref 70–99)

## 2020-11-22 LAB — ECHOCARDIOGRAM COMPLETE
Area-P 1/2: 3.53 cm2
Height: 62 in
S' Lateral: 2.7 cm
Weight: 2744.29 oz

## 2020-11-22 LAB — URINE CULTURE: Culture: <10000 — AB

## 2020-11-22 MED ORDER — LOPERAMIDE HCL 2 MG PO CAPS
2.0000 mg | ORAL_CAPSULE | ORAL | Status: DC | PRN
  Administered 2020-11-22 – 2020-11-24 (×4): 2 mg via ORAL
  Filled 2020-11-22 (×4): qty 1

## 2020-11-22 MED ORDER — SODIUM CHLORIDE 0.9 % IV SOLN: Freq: Once | INTRAVENOUS | Status: AC

## 2020-11-22 MED ORDER — IOHEXOL 9 MG/ML PO SOLN
500.0000 mL | ORAL | Status: AC
  Administered 2020-11-22: 15:00:00 500 mL via ORAL

## 2020-11-22 MED ORDER — PHENAZOPYRIDINE HCL 100 MG PO TABS
100.0000 mg | ORAL_TABLET | Freq: Once | ORAL | Status: AC
  Administered 2020-11-22: 21:00:00 100 mg via ORAL
  Filled 2020-11-22: qty 1

## 2020-11-22 MED ORDER — MELATONIN 5 MG PO TABS
5.0000 mg | ORAL_TABLET | Freq: Every day | ORAL | Status: DC
  Administered 2020-11-22 – 2020-11-25 (×4): 5 mg via ORAL
  Filled 2020-11-22 (×4): qty 1

## 2020-11-22 MED ORDER — IOHEXOL 300 MG/ML  SOLN: 100.0000 mL | Freq: Once | INTRAMUSCULAR | Status: DC | PRN

## 2020-11-22 MED ORDER — DICYCLOMINE HCL 10 MG PO CAPS
10.0000 mg | ORAL_CAPSULE | Freq: Three times a day (TID) | ORAL | Status: DC | PRN
  Administered 2020-11-22 – 2020-11-24 (×6): 10 mg via ORAL
  Filled 2020-11-22 (×6): qty 1

## 2020-11-22 MED ORDER — IOHEXOL 9 MG/ML PO SOLN
ORAL | Status: AC
  Filled 2020-11-22: qty 1000

## 2020-11-22 MED ORDER — POTASSIUM CHLORIDE CRYS ER 20 MEQ PO TBCR
40.0000 meq | EXTENDED_RELEASE_TABLET | Freq: Once | ORAL | Status: AC
  Administered 2020-11-22: 12:00:00 40 meq via ORAL
  Filled 2020-11-22: qty 2

## 2020-11-22 MED ORDER — ACETAMINOPHEN 500 MG PO TABS
1000.0000 mg | ORAL_TABLET | Freq: Four times a day (QID) | ORAL | Status: DC | PRN
  Administered 2020-11-22 – 2020-11-24 (×5): 1000 mg via ORAL
  Filled 2020-11-22 (×5): qty 2

## 2020-11-22 MED ORDER — IOHEXOL 300 MG/ML  SOLN
100.0000 mL | Freq: Once | INTRAMUSCULAR | Status: AC | PRN
  Administered 2020-11-22: 17:00:00 100 mL via INTRAVENOUS

## 2020-11-22 NOTE — Progress Notes (Signed)
PROGRESS NOTE    Kathy [name]  [id number] DOB: 1951-05-23 DOA: 11/21/2020 PCP: Denita Lung, MD    Chief Complaint  Patient presents with  . Loss of Consciousness    Brief Narrative:  69 year old lady prior history of hypertension, osteoarthritis, fibromyalgia presents to ED after a syncopal episode on 11/21/2020. As per the patient she reports she had a UTI and was prescribed antibiotics. After a couple of days she started having nausea vomiting abdominal pain and diarrhea. Earlier yesterday morning she got up to go to the bathroom and as she was coming back to bed she reports passing out. On arrival to ED she was found to be hypokalemic hyponatremic. CT of the head was is negative for acute intracranial pathology. Renal ultrasound is negative for hydronephrosis.  Assessment & Plan:   Active Problems:   Syncope  Syncopal episode probably secondary to vasovagal episode/orthostatic hypotension She is alert and oriented and appears to be back to baseline Echocardiogram and shows preserved left ventricular ejection fraction without any wall motion abnormalities, diastolic grade 1 dysfunction present Overnight telemetry does not show any abnormal rhythms CT of the head without contrast does not show any acute intracranial abnormalities. PT evaluation done. No signs of infection found.   Persistent nausea, abdominal pain and diarrhea Stool negative for C. difficile CT of the abdomen pelvis ordered for evaluation of persistent lower quadrant abdominal pain.    Essential hypertension Patient was hypertensive on arrival and she was started on IV fluids.   Hypokalemia Replaced  Mild AKI Probably secondary to prerenal causes. Creatinine back to baseline.   Hyponatremia Probably secondary to dehydration, resolved with IV fluids.    DVT prophylaxis: (Lovenox) Code Status: (Full code) Family Communication: family at bedside Disposition:   Status is:  Observation  The patient will require care spanning > 2 midnights and should be moved to inpatient because: Ongoing diagnostic testing needed not appropriate for outpatient work up and IV treatments appropriate due to intensity of illness or inability to take PO  Dispo: The patient is from: Home              Anticipated d/c is to: pending              Anticipated d/c date is: 1 day              Patient currently is not medically stable to d/c.       Consultants:   none   Procedures: echocardiogram.    Antimicrobials: none.    Subjective: Persistent lower abd pain, with some nausea.   Objective: Vitals:   11/22/20 0151 11/22/20 0427 11/22/20 0438 11/22/20 1317  BP: 98/66  108/76 (!) 119/59  Pulse: 82  77 71  Resp: 18  20   Temp: 98.5 F (36.9 C)  97.8 F (36.6 C) 98 F (36.7 C)  TempSrc: Oral  Oral Oral  SpO2: 98%  100% 100%  Weight:  77.8 kg    Height:        Intake/Output Summary (Last 24 hours) at 11/22/2020 1503 Last data filed at 11/22/2020 0530 Gross per 24 hour  Intake 1723 ml  Output 150 ml  Net 1573 ml   Filed Weights   11/21/20 0740 11/22/20 0427  Weight: 79.4 kg 77.8 kg    Examination:  General exam: Appears calm and comfortable  Respiratory system: Clear to auscultation. Respiratory effort normal. Cardiovascular system: S1 & S2 heard, RRR. No JVD, No pedal edema. Gastrointestinal system: Abdomen  is nondistended, soft and nontender.. Normal bowel sounds heard. Central nervous system: Alert and oriented. No focal neurological deficits. Extremities: Symmetric 5 x 5 power. Skin: No rashes, lesions or ulcers Psychiatry: Mood & affect appropriate.     Data Reviewed: I have personally reviewed following labs and imaging studies  CBC: Recent Labs  Lab 11/21/20 0806 11/22/20 0610  WBC 5.0 4.2  NEUTROABS 3.3 2.0  HGB 13.1 12.8  HCT 36.2 37.6  MCV 91.6 95.2  PLT 275 0000000    Basic Metabolic Panel: Recent Labs  Lab 11/21/20 0806  11/21/20 2014 11/22/20 0610  NA 129* 136 136  K 2.8* 3.3* 3.4*  CL 95* 105 106  CO2 22 19* 17*  GLUCOSE 102* 102* 93  BUN 12 7* 7*  CREATININE 1.11* 0.62 0.55  CALCIUM 9.6 9.2 8.9    GFR: Estimated Creatinine Clearance: 64.1 mL/min (by C-G formula based on SCr of 0.55 mg/dL).  Liver Function Tests: Recent Labs  Lab 11/21/20 0806 11/22/20 0610  AST 37 31  ALT 23 22  ALKPHOS 85 79  BILITOT 0.7 0.5  PROT 7.4 6.8  ALBUMIN 4.5 4.1    CBG: Recent Labs  Lab 11/22/20 0419  GLUCAP 81     Recent Results (from the past 240 hour(s))  Urine culture     Status: Abnormal   Collection Time: 11/21/20  8:18 AM   Specimen: Urine, Clean Catch  Result Value Ref Range Status   Specimen Description   Final    URINE, CLEAN CATCH Performed at [facility], [place] [address] [place], [place] [address]    Special Requests   Final    NONE Performed at [facility], [place] [address] [place], [place] [address]    Culture (A)  Final    <10,000 COLONIES/mL INSIGNIFICANT GROWTH Performed at [place] [address], [place], [place] [address]    Report Status 11/22/2020 FINAL  Final  C Difficile Quick Screen w PCR reflex     Status: None   Collection Time: 11/21/20 12:30 PM   Specimen: STOOL  Result Value Ref Range Status   C Diff antigen NEGATIVE NEGATIVE Final   C Diff toxin NEGATIVE NEGATIVE Final   C Diff interpretation No C. difficile detected.  Final    Comment: Performed at [facility], [place] [address] [place],  [address]  Resp Panel by RT-PCR (Flu A&B, Covid) Nasopharyngeal Swab     Status: None   Collection Time: 11/21/20  2:17 PM   Specimen: Nasopharyngeal Swab; Nasopharyngeal(NP) swabs in vial transport medium  Result Value Ref Range Status   SARS Coronavirus 2 by RT PCR NEGATIVE NEGATIVE Final    Comment: (NOTE) SARS-CoV-2 target nucleic acids are NOT DETECTED.  The SARS-CoV-2 RNA is generally  detectable in upper respiratory specimens during the acute phase of infection. The lowest concentration of SARS-CoV-2 viral copies this assay can detect is 138 copies/mL. A negative result does not preclude SARS-Cov-2 infection and should not be used as the sole basis for treatment or other patient management decisions. A negative result may occur with  improper specimen collection/handling, submission of specimen other than nasopharyngeal swab, presence of viral mutation(s) within the areas targeted by this assay, and inadequate number of viral copies(<138 copies/mL). A negative result must be combined with clinical observations, patient history, and epidemiological information. The expected result is Negative.  Fact Sheet for Patients:  EntrepreneurPulse.com.au  Fact Sheet for Healthcare Providers:  IncredibleEmployment.be  This test is no t yet approved or cleared by the [place] and  has been authorized for detection and/or diagnosis of SARS-CoV-2 by FDA under an Emergency Use Authorization (EUA). This EUA will remain  in effect (meaning this test can be used) for the duration of the COVID-19 declaration under Section 564(b)(1) of the Act, 21 U.S.C.section 360bbb-3(b)(1), unless the authorization is terminated  or revoked sooner.       Influenza A by PCR NEGATIVE NEGATIVE Final   Influenza B by PCR NEGATIVE NEGATIVE Final    Comment: (NOTE) The Xpert Xpress SARS-CoV-2/FLU/RSV plus assay is intended as an aid in the diagnosis of influenza from Nasopharyngeal swab specimens and should not be used as a sole basis for treatment. Nasal washings and aspirates are unacceptable for Xpert Xpress SARS-CoV-2/FLU/RSV testing.  Fact Sheet for Patients: EntrepreneurPulse.com.au  Fact Sheet for Healthcare Providers: IncredibleEmployment.be  This test is not yet approved or cleared by the [place] FDA  and has been authorized for detection and/or diagnosis of SARS-CoV-2 by FDA under an Emergency Use Authorization (EUA). This EUA will remain in effect (meaning this test can be used) for the duration of the COVID-19 declaration under Section 564(b)(1) of the Act, 21 U.S.C. section 360bbb-3(b)(1), unless the authorization is terminated or revoked.  Performed at [facility], [place] [address], [place], [place] [address]          Radiology Studies: CT Head Wo Contrast  Result Date: 11/21/2020 CLINICAL DATA:  Syncopal episode today with trauma to the head and neck. EXAM: CT HEAD WITHOUT CONTRAST CT CERVICAL SPINE WITHOUT CONTRAST TECHNIQUE: Multidetector CT imaging of the head and cervical spine was performed following the standard protocol without intravenous contrast. Multiplanar CT image reconstructions of the cervical spine were also generated. COMPARISON:  None. FINDINGS: CT HEAD FINDINGS Brain: No sign of acute infarction. Mild chronic small-vessel ischemic change of the white matter. No mass, hemorrhage, hydrocephalus or extra-axial collection. Vascular: There is atherosclerotic calcification of the major vessels at the base of the brain. Skull: Negative Sinuses/Orbits: Clear/normal Other: None CT CERVICAL SPINE FINDINGS Alignment: Normal Skull base and vertebrae: No fracture or primary bone lesion. Soft tissues and spinal canal: No significant soft tissue finding. 12 mm cyst or nodule right thyroid lobe. Disc levels: Facet osteoarthritis on the left at C3-4 and C4-5. Degenerative spondylosis at C4-5, C5-6 and C6-7. Mild foraminal narrowing on the left due to osteophytic encroachment at C4-5, C5-6 and C6-7. Minimal encroachment on the right. Upper chest: Negative except for pulmonary scarring. Other: None IMPRESSION: HEAD CT: No acute or traumatic finding. Mild chronic small-vessel ischemic changes of the white matter. CERVICAL SPINE CT: 1. No acute or traumatic finding.  Ordinary degenerative spondylosis and facet osteoarthritis. Foraminal narrowing on the left at C4-5, C5-6 and C6-7. 2. 12 mm cyst or nodule right thyroid lobe. No followup recommended (ref: J Am Coll Radiol. 2015 Feb;12(2): 143-50). Electronically Signed   By: Nelson Chimes M.D.   On: 11/21/2020 08:59   CT Cervical Spine Wo Contrast  Result Date: 11/21/2020 CLINICAL DATA:  Syncopal episode today with trauma to the head and neck. EXAM: CT HEAD WITHOUT CONTRAST CT CERVICAL SPINE WITHOUT CONTRAST TECHNIQUE: Multidetector CT imaging of the head and cervical spine was performed following the standard protocol without intravenous contrast. Multiplanar CT image reconstructions of the cervical spine were also generated. COMPARISON:  None. FINDINGS: CT HEAD FINDINGS Brain: No sign of acute infarction. Mild chronic small-vessel ischemic change of the  white matter. No mass, hemorrhage, hydrocephalus or extra-axial collection. Vascular: There is atherosclerotic calcification of the major vessels at the base of the brain. Skull: Negative Sinuses/Orbits: Clear/normal Other: None CT CERVICAL SPINE FINDINGS Alignment: Normal Skull base and vertebrae: No fracture or primary bone lesion. Soft tissues and spinal canal: No significant soft tissue finding. 12 mm cyst or nodule right thyroid lobe. Disc levels: Facet osteoarthritis on the left at C3-4 and C4-5. Degenerative spondylosis at C4-5, C5-6 and C6-7. Mild foraminal narrowing on the left due to osteophytic encroachment at C4-5, C5-6 and C6-7. Minimal encroachment on the right. Upper chest: Negative except for pulmonary scarring. Other: None IMPRESSION: HEAD CT: No acute or traumatic finding. Mild chronic small-vessel ischemic changes of the white matter. CERVICAL SPINE CT: 1. No acute or traumatic finding. Ordinary degenerative spondylosis and facet osteoarthritis. Foraminal narrowing on the left at C4-5, C5-6 and C6-7. 2. 12 mm cyst or nodule right thyroid lobe. No followup  recommended (ref: J Am Coll Radiol. 2015 Feb;12(2): 143-50). Electronically Signed   By: Nelson Chimes M.D.   On: 11/21/2020 08:59   US RENAL  Result Date: 11/21/2020 CLINICAL DATA:  Acute renal insufficiency, hypertension EXAM: RENAL / URINARY TRACT ULTRASOUND COMPLETE COMPARISON:  01/11/2019 FINDINGS: Right Kidney: Renal measurements: 10.5 x 4.0 x 4.3 cm = volume: 93.2 mL. Echogenicity within normal limits. No mass or hydronephrosis visualized. Left Kidney: Renal measurements: 12.2 x 6.0 x 5.3 cm = volume: 204.8 mL. Echogenicity within normal limits. No mass or hydronephrosis visualized. Bladder: Appears normal for degree of bladder distention. Other: None. IMPRESSION: 1. Unremarkable renal ultrasound. Electronically Signed   By: Randa Ngo M.D.   On: 11/21/2020 18:11   ECHOCARDIOGRAM COMPLETE  Result Date: 11/22/2020    ECHOCARDIOGRAM REPORT   Patient Name:   Kathy [name] Date of Exam: 11/22/2020 Medical Rec #:  [id number]         Height:       62.0 in Accession #:    [id number]        Weight:       171.5 lb Date of Birth:  November 18, 1951         BSA:          1.791 m Patient Age:    71 years          BP:           108/76 mmHg Patient Gender: F                 HR:           77 bpm. Exam Location:  Inpatient Procedure: 2D Echo, 3D Echo, Cardiac Doppler, Color Doppler and Strain Analysis Indications:    Syncope 780.2 / R55  History:        Patient has no prior history of Echocardiogram examinations.                 Risk Factors:Hypertension.  Sonographer:    Darlina Sicilian RDCS Referring Phys: [id number] [place]  1. Left ventricular ejection fraction, by estimation, is 55 to 60%. The left ventricle has normal function. The left ventricle has no regional wall motion abnormalities. Left ventricular diastolic parameters are consistent with Grade I diastolic dysfunction (impaired relaxation). Elevated left ventricular end-diastolic pressure. The average left ventricular global longitudinal  strain is -19.9 %. The global longitudinal strain is normal.  2. Right ventricular systolic function is normal. The right ventricular size is normal. There is normal pulmonary artery  systolic pressure.  3. The mitral valve is normal in structure. Trivial mitral valve regurgitation. No evidence of mitral stenosis.  4. The aortic valve is tricuspid. There is mild calcification of the aortic valve. There is mild thickening of the aortic valve. Aortic valve regurgitation is not visualized. No aortic stenosis is present.  5. The inferior vena cava is normal in size with greater than 50% respiratory variability, suggesting right atrial pressure of 3 mmHg. FINDINGS  Left Ventricle: Left ventricular ejection fraction, by estimation, is 55 to 60%. The left ventricle has normal function. The left ventricle has no regional wall motion abnormalities. The average left ventricular global longitudinal strain is -19.9 %. The global longitudinal strain is normal. The left ventricular internal cavity size was normal in size. There is no left ventricular hypertrophy. Left ventricular diastolic parameters are consistent with Grade I diastolic dysfunction (impaired relaxation). Elevated left ventricular end-diastolic pressure. Right Ventricle: The right ventricular size is normal. No increase in right ventricular wall thickness. Right ventricular systolic function is normal. There is normal pulmonary artery systolic pressure. The tricuspid regurgitant velocity is 2.21 m/s, and  with an assumed right atrial pressure of 3 mmHg, the estimated right ventricular systolic pressure is [id number] mmHg. Left Atrium: Left atrial size was normal in size. Right Atrium: Right atrial size was normal in size. Pericardium: There is no evidence of pericardial effusion. Mitral Valve: The mitral valve is normal in structure. There is mild thickening of the mitral valve leaflet(s). Mild to moderate mitral annular calcification. Trivial mitral valve  regurgitation. No evidence of mitral valve stenosis. Tricuspid Valve: The tricuspid valve is normal in structure. Tricuspid valve regurgitation is mild . No evidence of tricuspid stenosis. Aortic Valve: The aortic valve is tricuspid. There is mild calcification of the aortic valve. There is mild thickening of the aortic valve. Aortic valve regurgitation is not visualized. No aortic stenosis is present. Pulmonic Valve: The pulmonic valve was normal in structure. Pulmonic valve regurgitation is not visualized. No evidence of pulmonic stenosis. Aorta: The aortic root is normal in size and structure. Venous: The inferior vena cava is normal in size with greater than 50% respiratory variability, suggesting right atrial pressure of 3 mmHg. IAS/Shunts: No atrial level shunt detected by color flow Doppler.  LEFT VENTRICLE PLAX 2D LVIDd:         3.90 cm  Diastology LVIDs:         2.70 cm  LV e' medial:    5.33 cm/s LV PW:         0.90 cm  LV E/e' medial:  15.3 LV IVS:        1.00 cm  LV e' lateral:   9.68 cm/s LVOT diam:     2.10 cm  LV E/e' lateral: 8.4 LV SV:         62 LV SV Index:   35       2D Longitudinal Strain LVOT Area:     3.46 cm 2D Strain GLS (A2C):   -19.6 %                         2D Strain GLS (A3C):   -20.3 %                         2D Strain GLS (A4C):   -19.8 %  2D Strain GLS Avg:     -19.9 % RIGHT VENTRICLE RV S prime:     12.20 cm/s TAPSE (M-mode): 1.9 cm LEFT ATRIUM             Index       RIGHT ATRIUM           Index LA diam:        2.10 cm 1.17 cm/m  RA Area:     11.20 cm LA Vol (A2C):   54.2 ml 30.26 ml/m RA Volume:   21.40 ml  11.95 ml/m LA Vol (A4C):   41.7 ml 23.26 ml/m LA Biplane Vol: 53.4 ml 29.82 ml/m  AORTIC VALVE LVOT Vmax:   83.60 cm/s LVOT Vmean:  56.500 cm/s LVOT VTI:    0.180 m  AORTA Ao Root diam: 2.90 cm Ao Asc diam:  3.15 cm MITRAL VALVE               TRICUSPID VALVE MV Area (PHT): 3.53 cm    TR Peak grad:   19.5 mmHg MV Decel Time: 215 msec    TR Vmax:         221.00 cm/s MV E velocity: 81.40 cm/s MV A velocity: 85.70 cm/s  SHUNTS MV E/A ratio:  0.95        Systemic VTI:  0.18 m                            Systemic Diam: 2.10 cm Skeet Latch MD Electronically signed by Skeet Latch MD Signature Date/Time: 11/22/2020/10:10:31 AM    Final         Scheduled Meds: . enoxaparin (LOVENOX) injection  40 mg Subcutaneous Q24H  . iohexol  500 mL Oral Q1H  . iohexol      . sodium chloride flush  3 mL Intravenous Q12H   Continuous Infusions: . sodium chloride       LOS: 0 days        Hosie Poisson, MD [facility]   To contact the attending provider between 7A-7P or the covering provider during after hours 7P-7A, please log into the web site [email or web address] and access using universal [place] password for that web site. If you do not have the password, please call the hospital operator.  11/22/2020, 3:03 PM

## 2020-11-22 NOTE — Evaluation (Signed)
Physical Therapy Evaluation Patient Details Name: Kathy [name] MRN: [id number] DOB: 1951/03/29 Today's Date: 11/22/2020   History of Present Illness  69 y.o. female with medical history significant of HTN, L TKA, HTN, Macular degeneration, fibromyalgia, sarcoidosis and admitted after a syncopal episode with fall.  Clinical Impression  Pt admitted with above diagnosis. Pt currently with functional limitations due to the deficits listed below (see PT Problem List). Pt will benefit from skilled PT to increase their independence and safety with mobility to allow discharge to the venue listed below.  Pt ambulated short distance in hallway and reports mobility more effortful and slow compared to baseline.  Pt reports pending podiatry outpatient surgery next week.  Recommend nursing staff ambulate with pt during acute stay.      Follow Up Recommendations No PT follow up    Equipment Recommendations  None recommended by PT    Recommendations for Other Services       Precautions / Restrictions Precautions Precautions: Fall      Mobility  Bed Mobility Overal bed mobility: Needs Assistance Bed Mobility: Supine to Sit;Sit to Supine     Supine to sit: Supervision Sit to supine: Supervision        Transfers Overall transfer level: Needs assistance Equipment used: None Transfers: Sit to/from Stand Sit to Stand: Min guard         General transfer comment: min/guard for safety  Ambulation/Gait Ambulation/Gait assistance: Min guard Gait Distance (Feet): 100 Feet Assistive device: IV Pole Gait Pattern/deviations: Step-through pattern;Decreased stride length     General Gait Details: pt with slow pace and utilized IV pole for a little more support; HR 82-93 bpm; pt denies any dizziness  Stairs            Wheelchair Mobility    Modified Rankin (Stroke Patients Only)       Balance Overall balance assessment: Mild deficits observed, not formally tested                                            Pertinent Vitals/Pain Pain Assessment: No/denies pain    Home Living Family/patient expects to be discharged to:: Private residence Living Arrangements: Children (son)   Type of Home: House Home Access: Stairs to enter Entrance Stairs-Rails: None Entrance Stairs-Number of Steps: 2 Home Layout: One level Home Equipment: Environmental consultant - 2 wheels;Bedside commode;Other (comment) (knee scooter)      Prior Function Level of Independence: Independent               Hand Dominance        Extremity/Trunk Assessment        Lower Extremity Assessment Lower Extremity Assessment: Generalized weakness    Cervical / Trunk Assessment Cervical / Trunk Assessment: Normal  Communication   Communication: No difficulties  Cognition Arousal/Alertness: Awake/alert Behavior During Therapy: WFL for tasks assessed/performed Overall Cognitive Status: Within Functional Limits for tasks assessed                                        General Comments      Exercises     Assessment/Plan    PT Assessment Patient needs continued PT services  PT Problem List Decreased strength;Decreased mobility;Decreased activity tolerance;Decreased knowledge of use of DME  PT Treatment Interventions DME instruction;Gait training;Balance training;Therapeutic exercise;Functional mobility training;Therapeutic activities;Patient/family education;Stair training    PT Goals (Current goals can be found in the Care Plan section)  Acute Rehab PT Goals PT Goal Formulation: With patient Time For Goal Achievement: 11/29/20 Potential to Achieve Goals: Good    Frequency Min 3X/week   Barriers to discharge        Co-evaluation               AM-PAC PT "6 Clicks" Mobility  Outcome Measure Help needed turning from your back to your side while in a flat bed without using bedrails?: None Help needed moving from lying on your back to  sitting on the side of a flat bed without using bedrails?: None Help needed moving to and from a bed to a chair (including a wheelchair)?: A Little Help needed standing up from a chair using your arms (e.g., wheelchair or bedside chair)?: A Little Help needed to walk in hospital room?: A Little Help needed climbing 3-5 steps with a railing? : A Little 6 Click Score: 20    End of Session   Activity Tolerance: Patient tolerated treatment well Patient left: in bed;with call bell/phone within reach;with family/visitor present   PT Visit Diagnosis: Difficulty in walking, not elsewhere classified (R26.2)    Time: 9242-6834 PT Time Calculation (min) (ACUTE ONLY): 16 min   Charges:   PT Evaluation $PT Eval Low Complexity: 1 Low     Kati PT, DPT Acute Rehabilitation Services Pager: [phone] Office: [phone]  York Ram E 11/22/2020, 10:57 AM

## 2020-11-22 NOTE — Progress Notes (Signed)
OT Cancellation Note  Patient Details Name: Kathy [name] MRN: [id number] DOB: 1950-12-17   Cancelled Treatment:    Reason Eval/Treat Not Completed: OT screened, no needs identified, will sign off  Aubrey, Thereasa Parkin 11/22/2020, 12:40 PM

## 2020-11-22 NOTE — Progress Notes (Signed)
Echocardiogram 2D Echocardiogram with 3D has been performed.  Darlina Sicilian M 11/22/2020, 9:35 AM

## 2020-11-22 NOTE — Care Management Obs Status (Signed)
[place] NOTIFICATION   Patient Details  Name: Kathy [name] MRN: [id number] Date of Birth: Apr 01, 1951   Medicare Observation Status Notification Given:  Yes    MahabirJuliann Pulse, RN 11/22/2020, 1:37 PM

## 2020-11-22 NOTE — Progress Notes (Signed)
Notified MD on call due to patient having lower abdominal pain and diarrhea. MD gave new orders.

## 2020-11-22 NOTE — Plan of Care (Signed)
  Problem: Education: °Goal: Knowledge of General Education information will improve °Description: Including pain rating scale, medication(s)/side effects and non-pharmacologic comfort measures °Outcome: Progressing °  °Problem: Clinical Measurements: °Goal: Respiratory complications will improve °Outcome: Progressing °Goal: Cardiovascular complication will be avoided °Outcome: Progressing °  °Problem: Activity: °Goal: Risk for activity intolerance will decrease °Outcome: Progressing °  °Problem: Nutrition: °Goal: Adequate nutrition will be maintained °Outcome: Progressing °  °Problem: Coping: °Goal: Level of anxiety will decrease °Outcome: Progressing °  °Problem: Elimination: °Goal: Will not experience complications related to urinary retention °Outcome: Progressing °  °Problem: Safety: °Goal: Ability to remain free from injury will improve °Outcome: Progressing °  °Problem: Skin Integrity: °Goal: Risk for impaired skin integrity will decrease °Outcome: Progressing °  °

## 2020-11-22 NOTE — TOC Initial Note (Signed)
Transition of Care [facility]) - Initial/Assessment Note    Patient Details  Name: Kathy [name] MRN: [id number] Date of Birth: [birth year]  Transition of Care [facility]) CM/SW Contact:    Dessa Phi, RN Phone Number: 11/22/2020, 1:39 PM  Clinical Narrative: From home. D/c plan return home.                  Expected Discharge Plan: Home/Self Care Barriers to Discharge: Continued Medical Work up   Patient Goals and CMS Choice Patient states their goals for this hospitalization and ongoing recovery are:: go home      Expected Discharge Plan and Services Expected Discharge Plan: Home/Self Care   Discharge Planning Services: CM Consult   Living arrangements for the past 2 months: Single Family Home                                      Prior Living Arrangements/Services Living arrangements for the past 2 months: Single Family Home Lives with:: Adult Children Patient language and need for interpreter reviewed:: Yes Do you feel safe going back to the place where you live?: Yes      Need for Family Participation in Patient Care: No (Comment) Care giver support system in place?: Yes (comment) Current home services: DME (rw,3n1) Criminal Activity/Legal Involvement Pertinent to Current Situation/Hospitalization: No - Comment as needed  Activities of Daily Living Home Assistive Devices/Equipment: Cane (specify quad or straight),Eyeglasses,Walker (specify type) (single point cane, front wheeled walker) ADL Screening (condition at time of admission) Patient's cognitive ability adequate to safely complete daily activities?: Yes Is the patient deaf or have difficulty hearing?: No Does the patient have difficulty seeing, even when wearing glasses/contacts?: Yes (macular degeneration) Does the patient have difficulty concentrating, remembering, or making decisions?: No Patient able to express need for assistance with ADLs?: Yes Does the patient have difficulty dressing or  bathing?: No Independently performs ADLs?: Yes (appropriate for developmental age) Does the patient have difficulty walking or climbing stairs?: Yes Weakness of Legs: Both Weakness of Arms/Hands: None  Permission Sought/Granted Permission sought to share information with : Case Manager Permission granted to share information with : Yes, Verbal Permission Granted  Share Information with NAME: Case manager     Permission granted to share info w Relationship: Lennette Bihari son [phone]     Emotional Assessment Appearance:: Appears stated age Attitude/Demeanor/Rapport: Gracious Affect (typically observed): Accepting Orientation: : Oriented to Self,Oriented to Place,Oriented to  Time,Oriented to Situation Alcohol / Substance Use: Not Applicable Psych Involvement: No (comment)  Admission diagnosis:  Hypokalemia [E87.6] Syncope [R55] Syncope, unspecified syncope type [R55] Diarrhea, unspecified type [R19.7] Patient Active Problem List   Diagnosis Date Noted  . Syncope 11/21/2020  . Obese 06/16/2019  . S/P left TKA 06/15/2019  . Pain in left knee 02/08/2019  . Arthritis 10/28/2016  . Allergic rhinitis due to pollen 02/03/2016  . Exotropia 04/08/2015  . Macular degeneration 04/27/2012  . Fibromyalgia 02/22/2012  . SARCOIDOSIS 07/09/2010  . Essential hypertension 07/09/2010   PCP:  Denita Lung, MD Pharmacy:   [id number] - [place], [place] [address] Phone: [phone] Fax: [phone]  [place], [place], [address] [place], [place] [address] Phone: [phone] Fax: [place], [place]  [address] [place] [address] Phone: [phone] Fax: [phone]  [facility] - [place], [place]  [address] Phone: [phone] Fax: [phone]     Social Determinants of Health (SDOH) Interventions    Readmission Risk Interventions No flowsheet data found.

## 2020-11-22 NOTE — Plan of Care (Signed)
  Problem: Education: Goal: Knowledge of General Education information will improve Description: Including pain rating scale, medication(s)/side effects and non-pharmacologic comfort measures Outcome: Progressing   Problem: Health Behavior/Discharge Planning: Goal: Ability to manage health-related needs will improve Outcome: Progressing   Problem: Clinical Measurements: Goal: Ability to maintain clinical measurements within normal limits will improve Outcome: Progressing Goal: Will remain free from infection Outcome: Progressing Goal: Diagnostic test results will improve Outcome: Progressing Goal: Respiratory complications will improve Outcome: Progressing Goal: Cardiovascular complication will be avoided Outcome: Progressing   Problem: Activity: Goal: Risk for activity intolerance will decrease Outcome: Progressing   Problem: Nutrition: Goal: Adequate nutrition will be maintained 11/22/2020 1727 by Meriel Pica, RN Outcome: Progressing 11/22/2020 1727 by Meriel Pica, RN Outcome: Progressing   Problem: Coping: Goal: Level of anxiety will decrease 11/22/2020 1727 by Meriel Pica, RN Outcome: Progressing 11/22/2020 1727 by Meriel Pica, RN Outcome: Progressing   Problem: Elimination: Goal: Will not experience complications related to bowel motility Outcome: Progressing Goal: Will not experience complications related to urinary retention Outcome: Progressing   Problem: Pain Managment: Goal: General experience of comfort will improve Outcome: Progressing   Problem: Safety: Goal: Ability to remain free from injury will improve Outcome: Progressing   Problem: Skin Integrity: Goal: Risk for impaired skin integrity will decrease Outcome: Progressing

## 2020-11-23 DIAGNOSIS — W19XXXA Unspecified fall, initial encounter: Secondary | ICD-10-CM | POA: Diagnosis present

## 2020-11-23 DIAGNOSIS — K529 Noninfective gastroenteritis and colitis, unspecified: Secondary | ICD-10-CM | POA: Diagnosis present

## 2020-11-23 DIAGNOSIS — E871 Hypo-osmolality and hyponatremia: Secondary | ICD-10-CM | POA: Diagnosis present

## 2020-11-23 DIAGNOSIS — J301 Allergic rhinitis due to pollen: Secondary | ICD-10-CM | POA: Diagnosis present

## 2020-11-23 DIAGNOSIS — N179 Acute kidney failure, unspecified: Secondary | ICD-10-CM | POA: Diagnosis not present

## 2020-11-23 DIAGNOSIS — M797 Fibromyalgia: Secondary | ICD-10-CM | POA: Diagnosis present

## 2020-11-23 DIAGNOSIS — Z885 Allergy status to narcotic agent status: Secondary | ICD-10-CM | POA: Diagnosis not present

## 2020-11-23 DIAGNOSIS — Z20822 Contact with and (suspected) exposure to covid-19: Secondary | ICD-10-CM | POA: Diagnosis present

## 2020-11-23 DIAGNOSIS — Z91040 Latex allergy status: Secondary | ICD-10-CM | POA: Diagnosis not present

## 2020-11-23 DIAGNOSIS — D869 Sarcoidosis, unspecified: Secondary | ICD-10-CM | POA: Diagnosis present

## 2020-11-23 DIAGNOSIS — Z6832 Body mass index (BMI) 32.0-32.9, adult: Secondary | ICD-10-CM | POA: Diagnosis not present

## 2020-11-23 DIAGNOSIS — I10 Essential (primary) hypertension: Secondary | ICD-10-CM | POA: Diagnosis present

## 2020-11-23 DIAGNOSIS — Z9079 Acquired absence of other genital organ(s): Secondary | ICD-10-CM | POA: Diagnosis not present

## 2020-11-23 DIAGNOSIS — R197 Diarrhea, unspecified: Secondary | ICD-10-CM | POA: Diagnosis not present

## 2020-11-23 DIAGNOSIS — Z90722 Acquired absence of ovaries, bilateral: Secondary | ICD-10-CM | POA: Diagnosis not present

## 2020-11-23 DIAGNOSIS — I951 Orthostatic hypotension: Secondary | ICD-10-CM | POA: Diagnosis present

## 2020-11-23 DIAGNOSIS — Z79899 Other long term (current) drug therapy: Secondary | ICD-10-CM | POA: Diagnosis not present

## 2020-11-23 DIAGNOSIS — R55 Syncope and collapse: Secondary | ICD-10-CM | POA: Diagnosis not present

## 2020-11-23 DIAGNOSIS — E876 Hypokalemia: Secondary | ICD-10-CM | POA: Diagnosis present

## 2020-11-23 DIAGNOSIS — E860 Dehydration: Secondary | ICD-10-CM | POA: Diagnosis present

## 2020-11-23 DIAGNOSIS — E669 Obesity, unspecified: Secondary | ICD-10-CM | POA: Diagnosis present

## 2020-11-23 DIAGNOSIS — Z90710 Acquired absence of both cervix and uterus: Secondary | ICD-10-CM | POA: Diagnosis not present

## 2020-11-23 DIAGNOSIS — Z96652 Presence of left artificial knee joint: Secondary | ICD-10-CM | POA: Diagnosis present

## 2020-11-23 DIAGNOSIS — H3530 Unspecified macular degeneration: Secondary | ICD-10-CM | POA: Diagnosis present

## 2020-11-23 DIAGNOSIS — Z8249 Family history of ischemic heart disease and other diseases of the circulatory system: Secondary | ICD-10-CM | POA: Diagnosis not present

## 2020-11-23 LAB — MAGNESIUM
Magnesium: 1.9 mg/dL (ref 1.7–2.4)
Magnesium: 1.6 mg/dL — ABNORMAL LOW (ref 1.7–2.4)

## 2020-11-23 LAB — BASIC METABOLIC PANEL
Anion gap: 12 (ref 5–15)
Anion gap: 11 (ref 5–15)
BUN: 6 mg/dL — ABNORMAL LOW (ref 8–23)
BUN: <5 mg/dL — ABNORMAL LOW (ref 8–23)
CO2: 20 mmol/L — ABNORMAL LOW (ref 22–32)
CO2: 18 mmol/L — ABNORMAL LOW (ref 22–32)
Calcium: 9.4 mg/dL (ref 8.9–10.3)
Calcium: 8.9 mg/dL (ref 8.9–10.3)
Chloride: 104 mmol/L (ref 98–111)
Chloride: 107 mmol/L (ref 98–111)
Creatinine, Ser: 0.51 mg/dL (ref 0.44–1.00)
Creatinine, Ser: 0.57 mg/dL (ref 0.44–1.00)
GFR, Estimated: >60 mL/min (ref >60)
GFR, Estimated: >60 mL/min (ref >60)
Glucose, Bld: 97 mg/dL (ref 70–99)
Glucose, Bld: 133 mg/dL — ABNORMAL HIGH (ref 70–99)
Potassium: 3.1 mmol/L — ABNORMAL LOW (ref 3.5–5.1)
Potassium: 2.9 mmol/L — ABNORMAL LOW (ref 3.5–5.1)
Sodium: 136 mmol/L (ref 135–145)
Sodium: 136 mmol/L (ref 135–145)

## 2020-11-23 LAB — GLUCOSE, CAPILLARY: Glucose-Capillary: 99 mg/dL (ref 70–99)

## 2020-11-23 LAB — PHOSPHORUS: Phosphorus: 3 mg/dL (ref 2.5–4.6)

## 2020-11-23 MED ORDER — SODIUM CHLORIDE 0.9 % IV SOLN: INTRAVENOUS | Status: DC

## 2020-11-23 MED ORDER — POTASSIUM CHLORIDE CRYS ER 20 MEQ PO TBCR
40.0000 meq | EXTENDED_RELEASE_TABLET | Freq: Two times a day (BID) | ORAL | Status: AC
  Administered 2020-11-23 – 2020-11-24 (×2): 40 meq via ORAL
  Filled 2020-11-23 (×2): qty 2

## 2020-11-23 NOTE — Progress Notes (Signed)
PROGRESS NOTE    Kathy [name]  [id number] DOB: May 04, 1951 DOA: 11/21/2020 PCP: Denita Lung, MD    Chief Complaint  Patient presents with  . Loss of Consciousness    Brief Narrative:  69 year old lady prior history of hypertension, osteoarthritis, fibromyalgia presents to ED after a syncopal episode on 11/21/2020. As per the patient she reports she had a UTI and was prescribed antibiotics. After a couple of days she started having nausea vomiting abdominal pain and diarrhea. Earlier yesterday morning she got up to go to the bathroom and as she was coming back to bed she reports passing out. On arrival to ED she was found to be hypokalemic hyponatremic. CT of the head was is negative for acute intracranial pathology. Renal ultrasound is negative for hydronephrosis. Overnight she had about 10 watery bowel movements. She reports feeling weak.  C DIFF PCR is negative.   Assessment & Plan:   Active Problems:   Syncope   Gastroenteritis  Syncopal episode probably secondary to vasovagal episode/orthostatic hypotension She is alert and oriented and appears to be back to baseline Echocardiogram and shows preserved left ventricular ejection fraction without any wall motion abnormalities, diastolic grade 1 dysfunction present Overnight telemetry does not show any abnormal rhythms CT of the head without contrast does not show any acute intracranial abnormalities. PT evaluation done. No signs of infection found.   Persistent nausea, abdominal pain and diarrhea Stool negative for C. difficile CT of the abdomen pelvis ordered for evaluation of persistent lower quadrant abdominal pain. It showed mild colitis. She had persistent diarrhea about 10 bowel movements .  Will send stool for GI pathogen PCR.  Hydrate with IV fluids, start the patient on clear liquids diet.     Essential hypertension Well controlled.    Hypokalemia Replaced  Mild AKI Probably secondary to  prerenal causes. Creatinine back to baseline after IV fluids.    Hyponatremia Probably secondary to dehydration, resolved with IV fluids.    DVT prophylaxis: (Lovenox) Code Status: (Full code) Family Communication: family at bedside Disposition:   Status is: Observation  The patient will require care spanning > 2 midnights and should be moved to inpatient because: Ongoing diagnostic testing needed not appropriate for outpatient work up and IV treatments appropriate due to intensity of illness or inability to take PO  Dispo: The patient is from: Home              Anticipated d/c is to: pending              Anticipated d/c date is: 1 day              Patient currently is not medically stable to d/c.       Consultants:   none   Procedures: echocardiogram.    Antimicrobials: none.    Subjective: Nauseated, no vomiting, about 10 bowel movements. Since last night.   Objective: Vitals:   11/22/20 1317 11/22/20 2030 11/23/20 0244 11/23/20 0521  BP: (!) 119/59 118/72 125/77 124/76  Pulse: 71 72 67 60  Resp: 16 16  18   Temp: 98 F (36.7 C) 97.9 F (36.6 C) 98.5 F (36.9 C) 98.2 F (36.8 C)  TempSrc: Oral  Oral   SpO2: 100% 100% 95% 100%  Weight:    78.2 kg  Height:        Intake/Output Summary (Last 24 hours) at 11/23/2020 1423 Last data filed at 11/23/2020 0950 Gross per 24 hour  Intake 320 ml  Output --  Net 320 ml   Filed Weights   11/21/20 0740 11/22/20 0427 11/23/20 0521  Weight: 79.4 kg 77.8 kg 78.2 kg    Examination:  General exam: Alert and comfortable, not in distress.  Respiratory system: clear to ausculation, no wheezing or rhonchi.  Cardiovascular system: S1 , S2 heard, RRR, no jVD, no pedal edema.  Gastrointestinal system: Abdomen is soft , mildly tender in the lower quadrant, bowel sounds wnl.  Central nervous system: Alert and oriented, grossly non focal.  Extremities: no pedal edema, or cyanosis.  Skin: No rashes.  Psychiatry: Mood  is appropriate.     Data Reviewed: I have personally reviewed following labs and imaging studies  CBC: Recent Labs  Lab 11/21/20 0806 11/22/20 0610  WBC 5.0 4.2  NEUTROABS 3.3 2.0  HGB 13.1 12.8  HCT 36.2 37.6  MCV 91.6 95.2  PLT 275 0000000    Basic Metabolic Panel: Recent Labs  Lab 11/21/20 0806 11/21/20 2014 11/22/20 0610 11/23/20 0533  NA 129* 136 136 136  K 2.8* 3.3* 3.4* 3.1*  CL 95* 105 106 104  CO2 22 19* 17* 20*  GLUCOSE 102* 102* 93 97  BUN 12 7* 7* 6*  CREATININE 1.11* 0.62 0.55 0.51  CALCIUM 9.6 9.2 8.9 9.4  MG  --   --   --  1.9  PHOS  --   --   --  3.0    GFR: Estimated Creatinine Clearance: 64.2 mL/min (by C-G formula based on SCr of 0.51 mg/dL).  Liver Function Tests: Recent Labs  Lab 11/21/20 0806 11/22/20 0610  AST 37 31  ALT 23 22  ALKPHOS 85 79  BILITOT 0.7 0.5  PROT 7.4 6.8  ALBUMIN 4.5 4.1    CBG: Recent Labs  Lab 11/22/20 0419 11/23/20 0516  GLUCAP 81 99     Recent Results (from the past 240 hour(s))  Urine culture     Status: Abnormal   Collection Time: 11/21/20  8:18 AM   Specimen: Urine, Clean Catch  Result Value Ref Range Status   Specimen Description   Final    URINE, CLEAN CATCH Performed at [facility], [place] [address] [place], [place] [address]    Special Requests   Final    NONE Performed at [facility], [place] [address], [place], [place] [address]    Culture (A)  Final    <10,000 COLONIES/mL INSIGNIFICANT GROWTH Performed at [place] [address], [place], [place] [address]    Report Status 11/22/2020 FINAL  Final  C Difficile Quick Screen w PCR reflex     Status: None   Collection Time: 11/21/20 12:30 PM   Specimen: STOOL  Result Value Ref Range Status   C Diff antigen NEGATIVE NEGATIVE Final   C Diff toxin NEGATIVE NEGATIVE Final   C Diff interpretation No C. difficile detected.  Final    Comment: Performed at [facility], [place]  [address] [place] [address] [place], [place] [address]  Resp Panel by RT-PCR (Flu A&B, Covid) Nasopharyngeal Swab     Status: None   Collection Time: 11/21/20  2:17 PM   Specimen: Nasopharyngeal Swab; Nasopharyngeal(NP) swabs in vial transport medium  Result Value Ref Range Status   SARS Coronavirus 2 by RT PCR NEGATIVE NEGATIVE Final    Comment: (NOTE) SARS-CoV-2 target nucleic acids are NOT DETECTED.  The SARS-CoV-2 RNA is generally detectable in upper respiratory specimens during the acute phase of infection. The lowest concentration of SARS-CoV-2  viral copies this assay can detect is 138 copies/mL. A negative result does not preclude SARS-Cov-2 infection and should not be used as the sole basis for treatment or other patient management decisions. A negative result may occur with  improper specimen collection/handling, submission of specimen other than nasopharyngeal swab, presence of viral mutation(s) within the areas targeted by this assay, and inadequate number of viral copies(<138 copies/mL). A negative result must be combined with clinical observations, patient history, and epidemiological information. The expected result is Negative.  Fact Sheet for Patients:  EntrepreneurPulse.com.au  Fact Sheet for Healthcare Providers:  IncredibleEmployment.be  This test is no t yet approved or cleared by the [place] FDA and  has been authorized for detection and/or diagnosis of SARS-CoV-2 by FDA under an Emergency Use Authorization (EUA). This EUA will remain  in effect (meaning this test can be used) for the duration of the COVID-19 declaration under Section 564(b)(1) of the Act, 21 U.S.C.section 360bbb-3(b)(1), unless the authorization is terminated  or revoked sooner.       Influenza A by PCR NEGATIVE NEGATIVE Final   Influenza B by PCR NEGATIVE NEGATIVE Final    Comment: (NOTE) The Xpert Xpress SARS-CoV-2/FLU/RSV plus assay is intended as an aid in the  diagnosis of influenza from Nasopharyngeal swab specimens and should not be used as a sole basis for treatment. Nasal washings and aspirates are unacceptable for Xpert Xpress SARS-CoV-2/FLU/RSV testing.  Fact Sheet for Patients: EntrepreneurPulse.com.au  Fact Sheet for Healthcare Providers: IncredibleEmployment.be  This test is not yet approved or cleared by the [place] FDA and has been authorized for detection and/or diagnosis of SARS-CoV-2 by FDA under an Emergency Use Authorization (EUA). This EUA will remain in effect (meaning this test can be used) for the duration of the COVID-19 declaration under Section 564(b)(1) of the Act, 21 U.S.C. section 360bbb-3(b)(1), unless the authorization is terminated or revoked.  Performed at [facility], [place] [address] [place], [place] [address]          Radiology Studies: CT ABDOMEN PELVIS W CONTRAST  Result Date: 11/22/2020 CLINICAL DATA:  69 year old female with abdominal pain. EXAM: CT ABDOMEN AND PELVIS WITH CONTRAST TECHNIQUE: Multidetector CT imaging of the abdomen and pelvis was performed using the standard protocol following bolus administration of intravenous contrast. CONTRAST:  138mL OMNIPAQUE IOHEXOL 300 MG/ML  SOLN COMPARISON:  Renal ultrasound dated 11/21/2020. FINDINGS: Lower chest: The visualized lung bases are clear. No intra-abdominal free air or free fluid. Hepatobiliary: Mild fatty liver. No intrahepatic biliary dilatation. The gallbladder is unremarkable. Pancreas: Unremarkable. No pancreatic ductal dilatation or surrounding inflammatory changes. Spleen: Normal in size without focal abnormality. Adrenals/Urinary Tract: The adrenal glands unremarkable. There is no hydronephrosis on either side. There is symmetric enhancement and excretion of contrast by both kidneys. The visualized ureters and urinary bladder appear unremarkable. Stomach/Bowel: There is loose stool  throughout the colon compatible with diarrheal state. Correlation with clinical exam and stool cultures recommended. There is mild thickened appearance of the: Which may represent mild colitis. There is no bowel obstruction. Normal appendix. Vascular/Lymphatic: The abdominal aorta and IVC unremarkable. No portal venous gas. There is no adenopathy. Reproductive: Hysterectomy. Other: None Musculoskeletal: Degenerative changes of the spine. No acute osseous pathology. IMPRESSION: 1. Diarrheal state with possible mild colitis. Correlation with clinical exam and stool cultures recommended. No bowel obstruction. Normal appendix. 2. Mild fatty liver. Electronically Signed   By: Anner Crete M.D.   On: 11/22/2020 17:52   US RENAL  Result Date: 11/21/2020 CLINICAL DATA:  Acute renal insufficiency, hypertension EXAM: RENAL / URINARY TRACT ULTRASOUND COMPLETE COMPARISON:  01/11/2019 FINDINGS: Right Kidney: Renal measurements: 10.5 x 4.0 x 4.3 cm = volume: 93.2 mL. Echogenicity within normal limits. No mass or hydronephrosis visualized. Left Kidney: Renal measurements: 12.2 x 6.0 x 5.3 cm = volume: 204.8 mL. Echogenicity within normal limits. No mass or hydronephrosis visualized. Bladder: Appears normal for degree of bladder distention. Other: None. IMPRESSION: 1. Unremarkable renal ultrasound. Electronically Signed   By: Randa Ngo M.D.   On: 11/21/2020 18:11   ECHOCARDIOGRAM COMPLETE  Result Date: 11/22/2020    ECHOCARDIOGRAM REPORT   Patient Name:   Kathy [name] Date of Exam: 11/22/2020 Medical Rec #:  [id number]         Height:       62.0 in Accession #:    [id number]        Weight:       171.5 lb Date of Birth:  [birth year]         BSA:          1.791 m Patient Age:    85 years          BP:           108/76 mmHg Patient Gender: F                 HR:           77 bpm. Exam Location:  Inpatient Procedure: 2D Echo, 3D Echo, Cardiac Doppler, Color Doppler and Strain Analysis Indications:    Syncope 780.2 /  R55  History:        Patient has no prior history of Echocardiogram examinations.                 Risk Factors:Hypertension.  Sonographer:    Darlina Sicilian RDCS Referring Phys: [id number] [place]  1. Left ventricular ejection fraction, by estimation, is 55 to 60%. The left ventricle has normal function. The left ventricle has no regional wall motion abnormalities. Left ventricular diastolic parameters are consistent with Grade I diastolic dysfunction (impaired relaxation). Elevated left ventricular end-diastolic pressure. The average left ventricular global longitudinal strain is -19.9 %. The global longitudinal strain is normal.  2. Right ventricular systolic function is normal. The right ventricular size is normal. There is normal pulmonary artery systolic pressure.  3. The mitral valve is normal in structure. Trivial mitral valve regurgitation. No evidence of mitral stenosis.  4. The aortic valve is tricuspid. There is mild calcification of the aortic valve. There is mild thickening of the aortic valve. Aortic valve regurgitation is not visualized. No aortic stenosis is present.  5. The inferior vena cava is normal in size with greater than 50% respiratory variability, suggesting right atrial pressure of 3 mmHg. FINDINGS  Left Ventricle: Left ventricular ejection fraction, by estimation, is 55 to 60%. The left ventricle has normal function. The left ventricle has no regional wall motion abnormalities. The average left ventricular global longitudinal strain is -19.9 %. The global longitudinal strain is normal. The left ventricular internal cavity size was normal in size. There is no left ventricular hypertrophy. Left ventricular diastolic parameters are consistent with Grade I diastolic dysfunction (impaired relaxation). Elevated left ventricular end-diastolic pressure. Right Ventricle: The right ventricular size is normal. No increase in right ventricular wall thickness. Right ventricular systolic  function is normal. There is normal pulmonary artery systolic pressure. The tricuspid regurgitant velocity is 2.21 m/s, and  with an assumed right atrial pressure of 3 mmHg, the estimated right ventricular systolic pressure is [id number] mmHg. Left Atrium: Left atrial size was normal in size. Right Atrium: Right atrial size was normal in size. Pericardium: There is no evidence of pericardial effusion. Mitral Valve: The mitral valve is normal in structure. There is mild thickening of the mitral valve leaflet(s). Mild to moderate mitral annular calcification. Trivial mitral valve regurgitation. No evidence of mitral valve stenosis. Tricuspid Valve: The tricuspid valve is normal in structure. Tricuspid valve regurgitation is mild . No evidence of tricuspid stenosis. Aortic Valve: The aortic valve is tricuspid. There is mild calcification of the aortic valve. There is mild thickening of the aortic valve. Aortic valve regurgitation is not visualized. No aortic stenosis is present. Pulmonic Valve: The pulmonic valve was normal in structure. Pulmonic valve regurgitation is not visualized. No evidence of pulmonic stenosis. Aorta: The aortic root is normal in size and structure. Venous: The inferior vena cava is normal in size with greater than 50% respiratory variability, suggesting right atrial pressure of 3 mmHg. IAS/Shunts: No atrial level shunt detected by color flow Doppler.  LEFT VENTRICLE PLAX 2D LVIDd:         3.90 cm  Diastology LVIDs:         2.70 cm  LV e' medial:    5.33 cm/s LV PW:         0.90 cm  LV E/e' medial:  15.3 LV IVS:        1.00 cm  LV e' lateral:   9.68 cm/s LVOT diam:     2.10 cm  LV E/e' lateral: 8.4 LV SV:         62 LV SV Index:   35       2D Longitudinal Strain LVOT Area:     3.46 cm 2D Strain GLS (A2C):   -19.6 %                         2D Strain GLS (A3C):   -20.3 %                         2D Strain GLS (A4C):   -19.8 %                         2D Strain GLS Avg:     -19.9 % RIGHT VENTRICLE RV S  prime:     12.20 cm/s TAPSE (M-mode): 1.9 cm LEFT ATRIUM             Index       RIGHT ATRIUM           Index LA diam:        2.10 cm 1.17 cm/m  RA Area:     11.20 cm LA Vol (A2C):   54.2 ml 30.26 ml/m RA Volume:   21.40 ml  11.95 ml/m LA Vol (A4C):   41.7 ml 23.26 ml/m LA Biplane Vol: 53.4 ml 29.82 ml/m  AORTIC VALVE LVOT Vmax:   83.60 cm/s LVOT Vmean:  56.500 cm/s LVOT VTI:    0.180 m  AORTA Ao Root diam: 2.90 cm Ao Asc diam:  3.15 cm MITRAL VALVE               TRICUSPID VALVE MV Area (PHT): 3.53 cm    TR Peak grad:   19.5 mmHg MV Decel Time: 215 msec  TR Vmax:        221.00 cm/s MV E velocity: 81.40 cm/s MV A velocity: 85.70 cm/s  SHUNTS MV E/A ratio:  0.95        Systemic VTI:  0.18 m                            Systemic Diam: 2.10 cm Skeet Latch MD Electronically signed by Skeet Latch MD Signature Date/Time: 11/22/2020/10:10:31 AM    Final         Scheduled Meds: . enoxaparin (LOVENOX) injection  40 mg Subcutaneous Q24H  . melatonin  5 mg Oral QHS  . sodium chloride flush  3 mL Intravenous Q12H   Continuous Infusions: . sodium chloride 75 mL/hr at 11/23/20 1025     LOS: 0 days        Hosie Poisson, MD [facility]   To contact the attending provider between 7A-7P or the covering provider during after hours 7P-7A, please log into the web site [email or web address] and access using universal [place] password for that web site. If you do not have the password, please call the hospital operator.  11/23/2020, 2:23 PM

## 2020-11-24 DIAGNOSIS — R55 Syncope and collapse: Secondary | ICD-10-CM | POA: Diagnosis not present

## 2020-11-24 DIAGNOSIS — N179 Acute kidney failure, unspecified: Secondary | ICD-10-CM | POA: Diagnosis not present

## 2020-11-24 DIAGNOSIS — R197 Diarrhea, unspecified: Secondary | ICD-10-CM | POA: Diagnosis not present

## 2020-11-24 DIAGNOSIS — K529 Noninfective gastroenteritis and colitis, unspecified: Secondary | ICD-10-CM | POA: Diagnosis not present

## 2020-11-24 LAB — GASTROINTESTINAL PANEL BY PCR, STOOL (REPLACES STOOL CULTURE)

## 2020-11-24 LAB — GLUCOSE, CAPILLARY: Glucose-Capillary: 83 mg/dL (ref 70–99)

## 2020-11-24 MED ORDER — POTASSIUM CHLORIDE 10 MEQ/100ML IV SOLN
10.0000 meq | INTRAVENOUS | Status: AC
  Administered 2020-11-24 (×2): 10 meq via INTRAVENOUS
  Filled 2020-11-24 (×2): qty 100

## 2020-11-24 MED ORDER — PIPERACILLIN-TAZOBACTAM 3.375 G IVPB
3.3750 g | Freq: Three times a day (TID) | INTRAVENOUS | Status: DC
  Administered 2020-11-24 – 2020-11-26 (×6): 3.375 g via INTRAVENOUS
  Filled 2020-11-24 (×6): qty 50

## 2020-11-24 MED ORDER — MAGNESIUM SULFATE 2 GM/50ML IV SOLN
2.0000 g | Freq: Once | INTRAVENOUS | Status: AC
  Administered 2020-11-24: 10:00:00 2 g via INTRAVENOUS
  Filled 2020-11-24: qty 50

## 2020-11-24 MED ORDER — POTASSIUM CHLORIDE CRYS ER 20 MEQ PO TBCR
40.0000 meq | EXTENDED_RELEASE_TABLET | Freq: Once | ORAL | Status: AC
  Administered 2020-11-24: 40 meq via ORAL
  Filled 2020-11-24: qty 2

## 2020-11-24 NOTE — Progress Notes (Signed)
Patient rate [place] at 8/10, across the lower abdomen. Pain is described as "throbbing and sharp". PCP was notified.

## 2020-11-24 NOTE — Progress Notes (Signed)
PROGRESS NOTE    Kathy [name]  DVV:[id number] DOB: 11-04-1951 DOA: 11/21/2020 PCP: Denita Lung, MD    Chief Complaint  Patient presents with  . Loss of Consciousness    Brief Narrative:  69 year old lady prior history of hypertension, osteoarthritis, fibromyalgia presents to ED after a syncopal episode on 11/21/2020. As per the patient she reports she had a UTI and was prescribed antibiotics. After a couple of days she started having nausea vomiting abdominal pain and diarrhea. Earlier yesterday morning she got up to go to the bathroom and as she was coming back to bed she reports passing out. On arrival to ED she was found to be hypokalemic hyponatremic. CT of the head was is negative for acute intracranial pathology. Renal ultrasound is negative for hydronephrosis. Overnight she had about 10 watery bowel movements. She reports feeling weak.  C DIFF PCR is negative.  CT abd shows colitis, she will be started on IV antibiotics.   Assessment & Plan:   Active Problems:   Syncope   Gastroenteritis  Syncopal episode probably secondary to vasovagal episode/orthostatic hypotension She is alert and oriented and appears to be back to baseline Echocardiogram and shows preserved left ventricular ejection fraction without any wall motion abnormalities, diastolic grade 1 dysfunction present Overnight telemetry does not show any abnormal rhythms CT of the head without contrast does not show any acute intracranial abnormalities. PT evaluation done, recommended no PT follow up.    Persistent nausea, abdominal pain and diarrhea Stool negative for C. difficile CT of the abdomen pelvis ordered for evaluation of persistent lower quadrant abdominal pain. It showed mild colitis. She had persistent diarrhea about 10 bowel movements ,  Will send stool for GI pathogen PCR.  Hydrate with IV fluids, start the patient on clear liquids diet.  No improvement in diarrhea, will start her on IV  zosyn.    Essential hypertension Well controlled BP parameters.   Hypokalemia Replaced  Mild AKI Probably secondary to prerenal causes. Creatinine back to baseline after IV fluids.    Hyponatremia Probably secondary to dehydration, resolved with IV fluids.    DVT prophylaxis: (Lovenox) Code Status: (Full code) Family Communication: none at bedside.  Disposition:   Status is: Observation  The patient will require care spanning > 2 midnights and should be moved to inpatient because: Ongoing diagnostic testing needed not appropriate for outpatient work up and IV treatments appropriate due to intensity of illness or inability to take PO  Dispo: The patient is from: Home              Anticipated d/c is to: pending              Anticipated d/c date is: 1 day              Patient currently is not medically stable to d/c.       Consultants:   none   Procedures: echocardiogram.    Antimicrobials: none.    Subjective: About 4-6 watery bowel movements in the last 24 hours.  Objective: Vitals:   11/23/20 2039 11/24/20 0500 11/24/20 0531 11/24/20 1106  BP: 129/76  116/73 133/70  Pulse: 66  60 70  Resp: 20  18 18   Temp: 97.7 F (36.5 C)  97.8 F (36.6 C) (!) 97.1 F (36.2 C)  TempSrc: Oral  Oral   SpO2: 100%  98% 100%  Weight:  83 kg    Height:        Intake/Output Summary (Last  24 hours) at 11/24/2020 1516 Last data filed at 11/24/2020 1403 Gross per 24 hour  Intake 2922.85 ml  Output 1103 ml  Net 1819.85 ml   Filed Weights   11/22/20 0427 11/23/20 0521 11/24/20 0500  Weight: 77.8 kg 78.2 kg 83 kg    Examination:  General exam: Well-developed lady not in any kind of distress Respiratory system: Clear to auscultation bilaterally, no wheezing or rhonchi Cardiovascular system: S1-S2 heard, regular rate rhythm, no JVD Gastrointestinal system: Abdomen is soft, mildly tender in the lower quadrant, nondistended, bowel sounds normal Central nervous  system: Alert and oriented, grossly nonfocal Extremities: No pedal edema or cyanosis.  Skin: No rashes seen Psychiatry: Mood is appropriate   Data Reviewed: I have personally reviewed following labs and imaging studies  CBC: Recent Labs  Lab 11/21/20 0806 11/22/20 0610  WBC 5.0 4.2  NEUTROABS 3.3 2.0  HGB 13.1 12.8  HCT 36.2 37.6  MCV 91.6 95.2  PLT 275 0000000    Basic Metabolic Panel: Recent Labs  Lab 11/21/20 0806 11/21/20 2014 11/22/20 0610 11/23/20 0533 11/23/20 1929  NA 129* 136 136 136 136  K 2.8* 3.3* 3.4* 3.1* 2.9*  CL 95* 105 106 104 107  CO2 22 19* 17* 20* 18*  GLUCOSE 102* 102* 93 97 133*  BUN 12 7* 7* 6* <5*  CREATININE 1.11* 0.62 0.55 0.51 0.57  CALCIUM 9.6 9.2 8.9 9.4 8.9  MG  --   --   --  1.9 1.6*  PHOS  --   --   --  3.0  --     GFR: Estimated Creatinine Clearance: 66.3 mL/min (by C-G formula based on SCr of 0.57 mg/dL).  Liver Function Tests: Recent Labs  Lab 11/21/20 0806 11/22/20 0610  AST 37 31  ALT 23 22  ALKPHOS 85 79  BILITOT 0.7 0.5  PROT 7.4 6.8  ALBUMIN 4.5 4.1    CBG: Recent Labs  Lab 11/22/20 0419 11/23/20 0516 11/24/20 0744  GLUCAP 81 99 83     Recent Results (from the past 240 hour(s))  Urine culture     Status: Abnormal   Collection Time: 11/21/20  8:18 AM   Specimen: Urine, Clean Catch  Result Value Ref Range Status   Specimen Description   Final    URINE, CLEAN CATCH Performed at [facility], [place] [address] [place], [place] [address]    Special Requests   Final    NONE Performed at [facility], [place] [address], [place], [place] [address]    Culture (A)  Final    <10,000 COLONIES/mL INSIGNIFICANT GROWTH Performed at [facility] [address] [place], [place] [address]    Report Status 11/22/2020 FINAL  Final  C Difficile Quick Screen w PCR reflex     Status: None   Collection Time: 11/21/20 12:30 PM   Specimen: STOOL  Result Value Ref Range Status    C Diff antigen NEGATIVE NEGATIVE Final   C Diff toxin NEGATIVE NEGATIVE Final   C Diff interpretation No C. difficile detected.  Final    Comment: Performed at [facility], [place] [address], [place], [place] [address]  Resp Panel by RT-PCR (Flu A&B, Covid) Nasopharyngeal Swab     Status: None   Collection Time: 11/21/20  2:17 PM   Specimen: Nasopharyngeal Swab; Nasopharyngeal(NP) swabs in vial transport medium  Result Value Ref Range Status   SARS Coronavirus 2 by RT PCR NEGATIVE NEGATIVE Final  Comment: (NOTE) SARS-CoV-2 target nucleic acids are NOT DETECTED.  The SARS-CoV-2 RNA is generally detectable in upper respiratory specimens during the acute phase of infection. The lowest concentration of SARS-CoV-2 viral copies this assay can detect is 138 copies/mL. A negative result does not preclude SARS-Cov-2 infection and should not be used as the sole basis for treatment or other patient management decisions. A negative result may occur with  improper specimen collection/handling, submission of specimen other than nasopharyngeal swab, presence of viral mutation(s) within the areas targeted by this assay, and inadequate number of viral copies(<138 copies/mL). A negative result must be combined with clinical observations, patient history, and epidemiological information. The expected result is Negative.  Fact Sheet for Patients:  BloggerCourse.com  Fact Sheet for Healthcare Providers:  SeriousBroker.it  This test is no t yet approved or cleared by the [place] FDA and  has been authorized for detection and/or diagnosis of SARS-CoV-2 by FDA under an Emergency Use Authorization (EUA). This EUA will remain  in effect (meaning this test can be used) for the duration of the COVID-19 declaration under Section 564(b)(1) of the Act, 21 U.S.C.section 360bbb-3(b)(1), unless the authorization is terminated  or revoked  sooner.       Influenza A by PCR NEGATIVE NEGATIVE Final   Influenza B by PCR NEGATIVE NEGATIVE Final    Comment: (NOTE) The Xpert Xpress SARS-CoV-2/FLU/RSV plus assay is intended as an aid in the diagnosis of influenza from Nasopharyngeal swab specimens and should not be used as a sole basis for treatment. Nasal washings and aspirates are unacceptable for Xpert Xpress SARS-CoV-2/FLU/RSV testing.  Fact Sheet for Patients: BloggerCourse.com  Fact Sheet for Healthcare Providers: SeriousBroker.it  This test is not yet approved or cleared by the [place] FDA and has been authorized for detection and/or diagnosis of SARS-CoV-2 by FDA under an Emergency Use Authorization (EUA). This EUA will remain in effect (meaning this test can be used) for the duration of the COVID-19 declaration under Section 564(b)(1) of the Act, 21 U.S.C. section 360bbb-3(b)(1), unless the authorization is terminated or revoked.  Performed at [facility], [address] [place], [place] [address]   Gastrointestinal Panel by PCR , Stool     Status: None   Collection Time: 11/23/20  1:10 PM   Specimen: Stool  Result Value Ref Range Status   Campylobacter species NOT DETECTED NOT DETECTED Final   Plesimonas shigelloides NOT DETECTED NOT DETECTED Final   Salmonella species NOT DETECTED NOT DETECTED Final   Yersinia enterocolitica NOT DETECTED NOT DETECTED Final   Vibrio species NOT DETECTED NOT DETECTED Final   Vibrio cholerae NOT DETECTED NOT DETECTED Final   Enteroaggregative E coli (EAEC) NOT DETECTED NOT DETECTED Final   Enteropathogenic E coli (EPEC) NOT DETECTED NOT DETECTED Final   Enterotoxigenic E coli (ETEC) NOT DETECTED NOT DETECTED Final   Shiga like toxin producing E coli (STEC) NOT DETECTED NOT DETECTED Final   Shigella/Enteroinvasive E coli (EIEC) NOT DETECTED NOT DETECTED Final   Cryptosporidium NOT DETECTED NOT  DETECTED Final   Cyclospora cayetanensis NOT DETECTED NOT DETECTED Final   Entamoeba histolytica NOT DETECTED NOT DETECTED Final   Giardia lamblia NOT DETECTED NOT DETECTED Final   Adenovirus F40/41 NOT DETECTED NOT DETECTED Final   Astrovirus NOT DETECTED NOT DETECTED Final   Norovirus GI/GII NOT DETECTED NOT DETECTED Final   Rotavirus A NOT DETECTED NOT DETECTED Final   Sapovirus (I, II, IV, and V) NOT DETECTED NOT DETECTED Final  Comment: Performed at [facility], [address] [place], [place] [address]         Radiology Studies: CT ABDOMEN PELVIS W CONTRAST  Result Date: 11/22/2020 CLINICAL DATA:  69 year old female with abdominal pain. EXAM: CT ABDOMEN AND PELVIS WITH CONTRAST TECHNIQUE: Multidetector CT imaging of the abdomen and pelvis was performed using the standard protocol following bolus administration of intravenous contrast. CONTRAST:  140mL OMNIPAQUE IOHEXOL 300 MG/ML  SOLN COMPARISON:  Renal ultrasound dated 11/21/2020. FINDINGS: Lower chest: The visualized lung bases are clear. No intra-abdominal free air or free fluid. Hepatobiliary: Mild fatty liver. No intrahepatic biliary dilatation. The gallbladder is unremarkable. Pancreas: Unremarkable. No pancreatic ductal dilatation or surrounding inflammatory changes. Spleen: Normal in size without focal abnormality. Adrenals/Urinary Tract: The adrenal glands unremarkable. There is no hydronephrosis on either side. There is symmetric enhancement and excretion of contrast by both kidneys. The visualized ureters and urinary bladder appear unremarkable. Stomach/Bowel: There is loose stool throughout the colon compatible with diarrheal state. Correlation with clinical exam and stool cultures recommended. There is mild thickened appearance of the: Which may represent mild colitis. There is no bowel obstruction. Normal appendix. Vascular/Lymphatic: The abdominal aorta and IVC unremarkable. No portal venous gas. There is no  adenopathy. Reproductive: Hysterectomy. Other: None Musculoskeletal: Degenerative changes of the spine. No acute osseous pathology. IMPRESSION: 1. Diarrheal state with possible mild colitis. Correlation with clinical exam and stool cultures recommended. No bowel obstruction. Normal appendix. 2. Mild fatty liver. Electronically Signed   By: Anner Crete M.D.   On: 11/22/2020 17:52        Scheduled Meds: . enoxaparin (LOVENOX) injection  40 mg Subcutaneous Q24H  . melatonin  5 mg Oral QHS  . sodium chloride flush  3 mL Intravenous Q12H   Continuous Infusions: . sodium chloride 75 mL/hr at 11/24/20 0023     LOS: 1 day        Hosie Poisson, MD [facility]   To contact the attending provider between 7A-7P or the covering provider during after hours 7P-7A, please log into the web site [email or web address] and access using universal [place] password for that web site. If you do not have the password, please call the hospital operator.  11/24/2020, 3:16 PM

## 2020-11-24 NOTE — Progress Notes (Signed)
Pharmacy Antibiotic Note  Kathy [name] is a 69 y.o. female admitted on 11/21/2020 with colitis.  Pharmacy has been consulted for Zosyn dosing.  Plan:  Zosyn 3.375g IV q8h (each dose infused over 4 hours)  Need for dosage adjustment appears unlikely at present, so pharmacy will sign off at this time. Please reconsult if a change in clinical status warrants re-evaluation of dosage.   Height: 5\' 2"  (157.5 cm) Weight: 83 kg (182 lb 15.7 oz) IBW/kg (Calculated) : 50.1  Temp (24hrs), Avg:97.5 F (36.4 C), Min:97.1 F (36.2 C), Max:97.8 F (36.6 C)  Recent Labs  Lab 11/21/20 0806 11/21/20 2014 11/22/20 0610 11/23/20 0533 11/23/20 1929  WBC 5.0  --  4.2  --   --   CREATININE 1.11* 0.62 0.55 0.51 0.57    Estimated Creatinine Clearance: 66.3 mL/min (by C-G formula based on SCr of 0.57 mg/dL).    Allergies  Allergen Reactions  . Lortab [Hydrocodone-Acetaminophen] Nausea And Vomiting  . Latex Other (See Comments)    Pt unsure of reaction!    Thank you for allowing pharmacy to be a part of this patient's care.  Luiz Ochoa 11/24/2020 4:00 PM

## 2020-11-25 ENCOUNTER — Telehealth: Payer: Self-pay

## 2020-11-25 DIAGNOSIS — K529 Noninfective gastroenteritis and colitis, unspecified: Secondary | ICD-10-CM | POA: Diagnosis not present

## 2020-11-25 DIAGNOSIS — R55 Syncope and collapse: Secondary | ICD-10-CM | POA: Diagnosis not present

## 2020-11-25 DIAGNOSIS — N179 Acute kidney failure, unspecified: Secondary | ICD-10-CM | POA: Diagnosis not present

## 2020-11-25 DIAGNOSIS — R197 Diarrhea, unspecified: Secondary | ICD-10-CM | POA: Diagnosis not present

## 2020-11-25 LAB — MAGNESIUM: Magnesium: 2 mg/dL (ref 1.7–2.4)

## 2020-11-25 LAB — BASIC METABOLIC PANEL
Anion gap: 10 (ref 5–15)
BUN: <5 mg/dL — ABNORMAL LOW (ref 8–23)
CO2: 22 mmol/L (ref 22–32)
Calcium: 9.6 mg/dL (ref 8.9–10.3)
Chloride: 105 mmol/L (ref 98–111)
Creatinine, Ser: 0.57 mg/dL (ref 0.44–1.00)
GFR, Estimated: >60 mL/min (ref >60)
Glucose, Bld: 88 mg/dL (ref 70–99)
Potassium: 3.9 mmol/L (ref 3.5–5.1)
Sodium: 137 mmol/L (ref 135–145)

## 2020-11-25 LAB — GLUCOSE, CAPILLARY: Glucose-Capillary: 89 mg/dL (ref 70–99)

## 2020-11-25 MED ORDER — POTASSIUM CHLORIDE CRYS ER 20 MEQ PO TBCR: 40.0000 meq | EXTENDED_RELEASE_TABLET | Freq: Two times a day (BID) | ORAL | Status: DC

## 2020-11-25 MED ORDER — MAGNESIUM SULFATE 2 GM/50ML IV SOLN: 2.0000 g | Freq: Once | INTRAVENOUS | Status: DC

## 2020-11-25 MED ORDER — LIP MEDEX EX OINT
TOPICAL_OINTMENT | CUTANEOUS | Status: DC | PRN
  Filled 2020-11-25: qty 7

## 2020-11-25 NOTE — Progress Notes (Signed)
Physical Therapy Treatment Patient Details Name: Kathy [name] MRN: [id number] DOB: [birth year] Today's Date: 11/25/2020    History of Present Illness 69 y.o. female with medical history significant of HTN, L TKA, HTN, Macular degeneration, fibromyalgia, sarcoidosis and admitted after a syncopal episode with fall.    PT Comments    Progressing with mobility.    Follow Up Recommendations  No PT follow up     Equipment Recommendations  None recommended by PT    Recommendations for Other Services       Precautions / Restrictions Precautions Precautions: Fall Restrictions Weight Bearing Restrictions: No    Mobility  Bed Mobility Overal bed mobility: Modified Independent                Transfers Overall transfer level: Needs assistance Equipment used: None Transfers: Sit to/from Stand Sit to Stand: Supervision         General transfer comment: supv for safety  Ambulation/Gait Ambulation/Gait assistance: Min guard Gait Distance (Feet): 200 Feet Assistive device: None Gait Pattern/deviations: Step-through pattern;Decreased stride length     General Gait Details: slow pace with intermittent use of hallway handrail to steady. pt denied lightheadedness/dizziness.   Stairs             Wheelchair Mobility    Modified Rankin (Stroke Patients Only)       Balance Overall balance assessment: Mild deficits observed, not formally tested                                          Cognition Arousal/Alertness: Awake/alert Behavior During Therapy: WFL for tasks assessed/performed Overall Cognitive Status: Within Functional Limits for tasks assessed                                        Exercises      General Comments        Pertinent Vitals/Pain Pain Assessment: No/denies pain    Home Living                      Prior Function            PT Goals (current goals can now be found in the care  plan section) Progress towards PT goals: Progressing toward goals    Frequency    Min 3X/week      PT Plan Current plan remains appropriate    Co-evaluation              AM-PAC PT "6 Clicks" Mobility   Outcome Measure  Help needed turning from your back to your side while in a flat bed without using bedrails?: None Help needed moving from lying on your back to sitting on the side of a flat bed without using bedrails?: None Help needed moving to and from a bed to a chair (including a wheelchair)?: A Little Help needed standing up from a chair using your arms (e.g., wheelchair or bedside chair)?: A Little Help needed to walk in hospital room?: A Little Help needed climbing 3-5 steps with a railing? : A Little 6 Click Score: 20    End of Session   Activity Tolerance: Patient tolerated treatment well Patient left: in bed;with call bell/phone within reach;with bed alarm set   PT Visit Diagnosis: Difficulty in walking, not  elsewhere classified (R26.2)     Time: 1206-1217 PT Time Calculation (min) (ACUTE ONLY): 11 min  Charges:  $Gait Training: 8-22 mins                         Doreatha Massed, PT Acute Rehabilitation  Office: [phone] Pager: [phone]

## 2020-11-25 NOTE — Progress Notes (Signed)
PROGRESS NOTE    Kathy [name]  [id number] DOB: Dec 08, 1950 DOA: 11/21/2020 PCP: Denita Lung, MD    Chief Complaint  Patient presents with  . Loss of Consciousness    Brief Narrative:  69 year old lady prior history of hypertension, osteoarthritis, fibromyalgia presents to ED after a syncopal episode on 11/21/2020. As per the patient she reports she had a UTI and was prescribed antibiotics. After a couple of days she started having nausea vomiting abdominal pain and diarrhea. Earlier yesterday morning she got up to go to the bathroom and as she was coming back to bed she reports passing out. On arrival to ED she was found to be hypokalemic hyponatremic. CT of the head was is negative for acute intracranial pathology. Renal ultrasound is negative for hydronephrosis. Overnight she had about 10 watery bowel movements. She reports feeling weak.  C DIFF PCR is negative.  CT abd shows colitis, she will be started on IV antibiotics.  She reports her diarrhea has improved, but continues to have some abdominal pain , but wants to advance her diet and see if she can tolerate.   Assessment & Plan:   Active Problems:   Syncope   Gastroenteritis  Syncopal episode probably secondary to vasovagal episode/orthostatic hypotension She is alert and oriented and appears to be back to baseline Echocardiogram and shows preserved left ventricular ejection fraction without any wall motion abnormalities, diastolic grade 1 dysfunction present Overnight telemetry does not show any abnormal rhythms CT of the head without contrast does not show any acute intracranial abnormalities. PT evaluation done, recommended no PT follow up.    Persistent nausea, abdominal pain and diarrhea Stool negative for C. difficile CT of the abdomen pelvis ordered for evaluation of persistent lower quadrant abdominal pain. It showed mild colitis.  GI pathogen for PCR is negative.  She had initially started with about  10 watery bowel movements per her bowel movements have improved her nausea has improved abdominal pain is intermittent at this time.  She was started on clear liquid diet and advanced as tolerated.    Essential hypertension Well-controlled blood pressure parameters  Hypokalemia Replaced repeat level within normal limit  Mild AKI Probably secondary to prerenal causes. Creatinine back to baseline after IV fluids.     Hyponatremia Probably secondary to dehydration, resolved with IV fluids.  Sodium is within normal limits    DVT prophylaxis: (Lovenox) Code Status: (Full code) Family Communication: none at bedside.  Disposition:   Status is: Inpatient  The patient will require care spanning > 2 midnights and should be moved to inpatient because: Ongoing diagnostic testing needed not appropriate for outpatient work up and IV treatments appropriate due to intensity of illness or inability to take PO  Dispo: The patient is from: Home              Anticipated d/c is to: pending              Anticipated d/c date is: 1 day              Patient currently is not medically stable to d/c.       Consultants:   none   Procedures: echocardiogram.    Antimicrobials: none.    Subjective: Only bowel movements during the day none overnight.  Nausea has improved abdominal pain slightly intermittent advancing diet at this time as per the patient's request.  Objective: Vitals:   11/24/20 0531 11/24/20 1106 11/24/20 2059 11/25/20 1247  BP: 116/73 133/70 Marland Kitchen)  143/75 114/68  Pulse: 60 70 72 68  Resp: 18 18 17 18   Temp: 97.8 F (36.6 C) (!) 97.1 F (36.2 C) 97.6 F (36.4 C) 97.6 F (36.4 C)  TempSrc: Oral     SpO2: 98% 100% 98% 100%  Weight:      Height:        Intake/Output Summary (Last 24 hours) at 11/25/2020 1528 Last data filed at 11/25/2020 1513 Gross per 24 hour  Intake 1495.32 ml  Output 200 ml  Net 1295.32 ml   Filed Weights   11/22/20 0427 11/23/20 0521  11/24/20 0500  Weight: 77.8 kg 78.2 kg 83 kg    Examination:  General exam: Alert and comfortable not in any distress Respiratory system: Clear to auscultation bilaterally, no wheezing or rhonchi Cardiovascular system: S1-S2 heard, regular rate rhythm, do no JVD, no pedal edema Gastrointestinal system: Abdomen is soft, mildly tender in the lower quadrant, bowel sounds normal. Central nervous system: Alert and oriented, grossly nonfocal Extremities: No pedal edema or cyanosis Skin:  no rashes seen Psychiatry: Mood is appropriate   Data Reviewed: I have personally reviewed following labs and imaging studies  CBC: Recent Labs  Lab 11/21/20 0806 11/22/20 0610  WBC 5.0 4.2  NEUTROABS 3.3 2.0  HGB 13.1 12.8  HCT 36.2 37.6  MCV 91.6 95.2  PLT 275 243    Basic Metabolic Panel: Recent Labs  Lab 11/21/20 2014 11/22/20 0610 11/23/20 0533 11/23/20 1929 11/25/20 0814  NA 136 136 136 136 137  K 3.3* 3.4* 3.1* 2.9* 3.9  CL 105 106 104 107 105  CO2 19* 17* 20* 18* 22  GLUCOSE 102* 93 97 133* 88  BUN 7* 7* 6* <5* <5*  CREATININE 0.62 0.55 0.51 0.57 0.57  CALCIUM 9.2 8.9 9.4 8.9 9.6  MG  --   --  1.9 1.6* 2.0  PHOS  --   --  3.0  --   --     GFR: Estimated Creatinine Clearance: 66.3 mL/min (by C-G formula based on SCr of 0.57 mg/dL).  Liver Function Tests: Recent Labs  Lab 11/21/20 0806 11/22/20 0610  AST 37 31  ALT 23 22  ALKPHOS 85 79  BILITOT 0.7 0.5  PROT 7.4 6.8  ALBUMIN 4.5 4.1    CBG: Recent Labs  Lab 11/22/20 0419 11/23/20 0516 11/24/20 0744 11/25/20 0750  GLUCAP 81 99 83 89     Recent Results (from the past 240 hour(s))  Urine culture     Status: Abnormal   Collection Time: 11/21/20  8:18 AM   Specimen: Urine, Clean Catch  Result Value Ref Range Status   Specimen Description   Final    URINE, CLEAN CATCH Performed at [facility] [address] [place], [place]    Special Requests   Final    NONE Performed  at [facility], [address], [place], [place]    Culture (A)  Final    <10,000 COLONIES/mL INSIGNIFICANT GROWTH Performed at [facility], [address] [place], [place]    Report Status 11/22/2020 FINAL  Final  C Difficile Quick Screen w PCR reflex     Status: None   Collection Time: 11/21/20 12:30 PM   Specimen: STOOL  Result Value Ref Range Status   C Diff antigen NEGATIVE NEGATIVE Final   C Diff toxin NEGATIVE NEGATIVE Final   C Diff interpretation No C. difficile detected.  Final    Comment:  Performed at [facility], [place] [address] [place], [place] [address]  Resp Panel by RT-PCR (Flu A&B, Covid) Nasopharyngeal Swab     Status: None   Collection Time: 11/21/20  2:17 PM   Specimen: Nasopharyngeal Swab; Nasopharyngeal(NP) swabs in vial transport medium  Result Value Ref Range Status   SARS Coronavirus 2 by RT PCR NEGATIVE NEGATIVE Final    Comment: (NOTE) SARS-CoV-2 target nucleic acids are NOT DETECTED.  The SARS-CoV-2 RNA is generally detectable in upper respiratory specimens during the acute phase of infection. The lowest concentration of SARS-CoV-2 viral copies this assay can detect is 138 copies/mL. A negative result does not preclude SARS-Cov-2 infection and should not be used as the sole basis for treatment or other patient management decisions. A negative result may occur with  improper specimen collection/handling, submission of specimen other than nasopharyngeal swab, presence of viral mutation(s) within the areas targeted by this assay, and inadequate number of viral copies(<138 copies/mL). A negative result must be combined with clinical observations, patient history, and epidemiological information. The expected result is Negative.  Fact Sheet for Patients:  EntrepreneurPulse.com.au  Fact Sheet for Healthcare Providers:  IncredibleEmployment.be  This  test is no t yet approved or cleared by the [place] FDA and  has been authorized for detection and/or diagnosis of SARS-CoV-2 by FDA under an Emergency Use Authorization (EUA). This EUA will remain  in effect (meaning this test can be used) for the duration of the COVID-19 declaration under Section 564(b)(1) of the Act, 21 U.S.C.section 360bbb-3(b)(1), unless the authorization is terminated  or revoked sooner.       Influenza A by PCR NEGATIVE NEGATIVE Final   Influenza B by PCR NEGATIVE NEGATIVE Final    Comment: (NOTE) The Xpert Xpress SARS-CoV-2/FLU/RSV plus assay is intended as an aid in the diagnosis of influenza from Nasopharyngeal swab specimens and should not be used as a sole basis for treatment. Nasal washings and aspirates are unacceptable for Xpert Xpress SARS-CoV-2/FLU/RSV testing.  Fact Sheet for Patients: EntrepreneurPulse.com.au  Fact Sheet for Healthcare Providers: IncredibleEmployment.be  This test is not yet approved or cleared by the [place] FDA and has been authorized for detection and/or diagnosis of SARS-CoV-2 by FDA under an Emergency Use Authorization (EUA). This EUA will remain in effect (meaning this test can be used) for the duration of the COVID-19 declaration under Section 564(b)(1) of the Act, 21 U.S.C. section 360bbb-3(b)(1), unless the authorization is terminated or revoked.  Performed at [facility], [place] [address] [place],  [address]   Gastrointestinal Panel by PCR , Stool     Status: None   Collection Time: 11/23/20  1:10 PM   Specimen: Stool  Result Value Ref Range Status   Campylobacter species NOT DETECTED NOT DETECTED Final   Plesimonas shigelloides NOT DETECTED NOT DETECTED Final   Salmonella species NOT DETECTED NOT DETECTED Final   Yersinia enterocolitica NOT DETECTED NOT DETECTED Final   Vibrio species NOT DETECTED NOT DETECTED Final   Vibrio cholerae NOT  DETECTED NOT DETECTED Final   Enteroaggregative E coli (EAEC) NOT DETECTED NOT DETECTED Final   Enteropathogenic E coli (EPEC) NOT DETECTED NOT DETECTED Final   Enterotoxigenic E coli (ETEC) NOT DETECTED NOT DETECTED Final   Shiga like toxin producing E coli (STEC) NOT DETECTED NOT DETECTED Final   Shigella/Enteroinvasive E coli (EIEC) NOT DETECTED NOT DETECTED Final   Cryptosporidium NOT DETECTED NOT DETECTED Final   Cyclospora cayetanensis NOT DETECTED NOT DETECTED Final  Entamoeba histolytica NOT DETECTED NOT DETECTED Final   Giardia lamblia NOT DETECTED NOT DETECTED Final   Adenovirus F40/41 NOT DETECTED NOT DETECTED Final   Astrovirus NOT DETECTED NOT DETECTED Final   Norovirus GI/GII NOT DETECTED NOT DETECTED Final   Rotavirus A NOT DETECTED NOT DETECTED Final   Sapovirus (I, II, IV, and V) NOT DETECTED NOT DETECTED Final    Comment: Performed at [facility], [address], [place], [place] [address]         Radiology Studies: No results found.      Scheduled Meds: . enoxaparin (LOVENOX) injection  40 mg Subcutaneous Q24H  . melatonin  5 mg Oral QHS  . sodium chloride flush  3 mL Intravenous Q12H   Continuous Infusions: . sodium chloride 75 mL/hr at 11/25/20 0856  . piperacillin-tazobactam (ZOSYN)  IV 3.375 g (11/25/20 0859)     LOS: 2 days        Hosie Poisson, MD [facility]   To contact the attending provider between 7A-7P or the covering provider during after hours 7P-7A, please log into the web site [email or web address] and access using universal [place] password for that web site. If you do not have the password, please call the hospital operator.  11/25/2020, 3:28 PM

## 2020-11-25 NOTE — Plan of Care (Signed)
Patient is gradually improving

## 2020-11-25 NOTE — Telephone Encounter (Signed)
Kathy [name]'s son called to cancel her surgery with Dr. Ardelle Anton on 11/29/2020. Kathy [name] is currently in the hospital. Surgery was rescheduled for 12/18/2020. Notified Cynthia with [facility] and Dr. Ardelle Anton

## 2020-11-25 NOTE — Plan of Care (Signed)
  Problem: Nutrition: Goal: Adequate nutrition will be maintained Outcome: Progressing   

## 2020-11-26 DIAGNOSIS — E876 Hypokalemia: Secondary | ICD-10-CM | POA: Diagnosis not present

## 2020-11-26 DIAGNOSIS — K529 Noninfective gastroenteritis and colitis, unspecified: Secondary | ICD-10-CM | POA: Diagnosis not present

## 2020-11-26 DIAGNOSIS — N179 Acute kidney failure, unspecified: Secondary | ICD-10-CM | POA: Diagnosis not present

## 2020-11-26 LAB — GLUCOSE, CAPILLARY: Glucose-Capillary: 96 mg/dL (ref 70–99)

## 2020-11-26 MED ORDER — LOPERAMIDE HCL 2 MG PO CAPS: 2.0000 mg | ORAL_CAPSULE | ORAL | 0 refills | Status: DC | PRN

## 2020-11-26 MED ORDER — MELATONIN 5 MG PO TABS: 5.0000 mg | ORAL_TABLET | Freq: Every day | ORAL | 0 refills | Status: DC

## 2020-11-26 MED ORDER — ACETAMINOPHEN 500 MG PO TABS: 1000.0000 mg | ORAL_TABLET | Freq: Three times a day (TID) | ORAL | Status: AC | PRN

## 2020-11-26 MED ORDER — METRONIDAZOLE 500 MG PO TABS: 500.0000 mg | ORAL_TABLET | Freq: Three times a day (TID) | ORAL | 0 refills | Status: AC

## 2020-11-26 MED ORDER — CIPROFLOXACIN HCL 500 MG PO TABS: 500.0000 mg | ORAL_TABLET | Freq: Two times a day (BID) | ORAL | 0 refills | Status: AC

## 2020-11-26 NOTE — Discharge Summary (Signed)
Physician Discharge Summary  Kathy [name] [id number] DOB: 1951/10/15 DOA: 11/21/2020  PCP: Denita Lung, MD  Admit date: 11/21/2020 Discharge date: 11/26/2020  Admitted From: Home.  Disposition:  Home.   Recommendations for Outpatient Follow-up:  1. Follow up with PCP in 1-2 weeks 2. Please obtain BMP/CBC in one week   Discharge Condition:stable.  CODE STATUS:FULL CODE.  Diet recommendation: Heart Healthy  Brief/Interim Summary: 69 year old lady prior history of hypertension, osteoarthritis, fibromyalgia presents to ED after a syncopal episode on 11/21/2020. As per the patient she reports she had a UTI and was prescribed antibiotics. After a couple of days she started having nausea vomiting abdominal pain and diarrhea. Earlier yesterday morning she got up to go to the bathroom and as she was coming back to bed she reports passing out. On arrival to ED she was found to be hypokalemic hyponatremic. CT of the head was is negative for acute intracranial pathology. Renal ultrasound is negative for hydronephrosis. Overnight she had about 10 watery bowel movements. She reports feeling weak.  C DIFF PCR is negative.  CT abd shows colitis, she will be started on IV antibiotics.  She reports her diarrhea has improved,  Discharge Diagnoses:  Active Problems:   Syncope   Gastroenteritis   Syncopal episode probably secondary to vasovagal episode/orthostatic hypotension She is alert and oriented and appears to be back to baseline Echocardiogram and shows preserved left ventricular ejection fraction without any wall motion abnormalities, diastolic grade 1 dysfunction present Overnight telemetry does not show any abnormal rhythms CT of the head without contrast does not show any acute intracranial abnormalities. PT evaluation done, recommended no PT follow up.    Persistent nausea, abdominal pain and diarrhea Stool negative for C. difficile CT of the abdomen pelvis ordered  for evaluation of persistent lower quadrant abdominal pain. It showed mild colitis.  GI pathogen for PCR is negative.  She had initially started with about 10 watery bowel movements per her bowel movements have improved her nausea has improved abdominal pain is intermittent at this time.  She was started on clear liquid diet and advanced as tolerated.     Essential hypertension Well-controlled blood pressure parameters  Hypokalemia Replaced repeat level within normal limit  Mild AKI Probably secondary to prerenal causes. Creatinine back to baseline after IV fluids.     Hyponatremia Probably secondary to dehydration, resolved with IV fluids.  Sodium is within normal limits    Discharge Instructions  Discharge Instructions    Diet - low sodium heart healthy   Complete by: As directed    Increase activity slowly   Complete by: As directed      Allergies as of 11/26/2020      Reactions   Lortab [hydrocodone-acetaminophen] Nausea And Vomiting   Latex Other (See Comments)   Pt unsure of reaction!      Medication List    STOP taking these medications   docusate sodium 100 MG capsule Commonly known as: Colace   ferrous sulfate 325 (65 FE) MG tablet Commonly known as: FerrouSul   meloxicam 7.5 MG tablet Commonly known as: MOBIC   methocarbamol 500 MG tablet Commonly known as: Robaxin   ofloxacin 0.3 % ophthalmic solution Commonly known as: OCUFLOX   polyethylene glycol 17 g packet Commonly known as: MIRALAX / GLYCOLAX   sulfamethoxazole-trimethoprim 800-160 MG tablet Commonly known as: BACTRIM DS     TAKE these medications   acetaminophen 500 MG tablet Commonly known as: TYLENOL Take 2 tablets (1,000  mg total) by mouth every 8 (eight) hours. What changed:   when to take this  reasons to take this   ciprofloxacin 500 MG tablet Commonly known as: Cipro Take 1 tablet (500 mg total) by mouth 2 (two) times daily for 7 days.   cycloSPORINE 0.05 %  ophthalmic emulsion Commonly known as: RESTASIS Place 1 drop into both eyes 2 (two) times daily.   diclofenac Sodium 1 % Gel Commonly known as: VOLTAREN APPLY 2 GRAMS TO AFFECTED AREA 4 TIMES A DAY   loperamide 2 MG capsule Commonly known as: IMODIUM Take 1 capsule (2 mg total) by mouth as needed for diarrhea or loose stools.   melatonin 5 MG Tabs Take 1 tablet (5 mg total) by mouth at bedtime.   metroNIDAZOLE 500 MG tablet Commonly known as: Flagyl Take 1 tablet (500 mg total) by mouth 3 (three) times daily for 7 days.   Olmesartan-amLODIPine-HCTZ 40-10-12.5 MG Tabs Take 1 tablet by mouth daily.   PreserVision AREDS 2 Caps Take 1 capsule by mouth 2 (two) times a day.       Follow-up Information    Denita Lung, MD. Schedule an appointment as soon as possible for a visit in 1 week(s).   Specialty: Family Medicine Contact information: [address] [place] [address] [phone]              Allergies  Allergen Reactions  . Lortab [Hydrocodone-Acetaminophen] Nausea And Vomiting  . Latex Other (See Comments)    Pt unsure of reaction!    Consultations:  None.    Procedures/Studies: DG Ankle 2 Views Right  Result Date: 11/18/2020 Please see detailed radiograph report in office note.  CT Head Wo Contrast  Result Date: 11/21/2020 CLINICAL DATA:  Syncopal episode today with trauma to the head and neck. EXAM: CT HEAD WITHOUT CONTRAST CT CERVICAL SPINE WITHOUT CONTRAST TECHNIQUE: Multidetector CT imaging of the head and cervical spine was performed following the standard protocol without intravenous contrast. Multiplanar CT image reconstructions of the cervical spine were also generated. COMPARISON:  None. FINDINGS: CT HEAD FINDINGS Brain: No sign of acute infarction. Mild chronic small-vessel ischemic change of the white matter. No mass, hemorrhage, hydrocephalus or extra-axial collection. Vascular: There is atherosclerotic calcification of the  major vessels at the base of the brain. Skull: Negative Sinuses/Orbits: Clear/normal Other: None CT CERVICAL SPINE FINDINGS Alignment: Normal Skull base and vertebrae: No fracture or primary bone lesion. Soft tissues and spinal canal: No significant soft tissue finding. 12 mm cyst or nodule right thyroid lobe. Disc levels: Facet osteoarthritis on the left at C3-4 and C4-5. Degenerative spondylosis at C4-5, C5-6 and C6-7. Mild foraminal narrowing on the left due to osteophytic encroachment at C4-5, C5-6 and C6-7. Minimal encroachment on the right. Upper chest: Negative except for pulmonary scarring. Other: None IMPRESSION: HEAD CT: No acute or traumatic finding. Mild chronic small-vessel ischemic changes of the white matter. CERVICAL SPINE CT: 1. No acute or traumatic finding. Ordinary degenerative spondylosis and facet osteoarthritis. Foraminal narrowing on the left at C4-5, C5-6 and C6-7. 2. 12 mm cyst or nodule right thyroid lobe. No followup recommended (ref: J Am Coll Radiol. 2015 Feb;12(2): 143-50). Electronically Signed   By: Nelson Chimes M.D.   On: 11/21/2020 08:59   CT Cervical Spine Wo Contrast  Result Date: 11/21/2020 CLINICAL DATA:  Syncopal episode today with trauma to the head and neck. EXAM: CT HEAD WITHOUT CONTRAST CT CERVICAL SPINE WITHOUT CONTRAST TECHNIQUE: Multidetector CT imaging of the head  and cervical spine was performed following the standard protocol without intravenous contrast. Multiplanar CT image reconstructions of the cervical spine were also generated. COMPARISON:  None. FINDINGS: CT HEAD FINDINGS Brain: No sign of acute infarction. Mild chronic small-vessel ischemic change of the white matter. No mass, hemorrhage, hydrocephalus or extra-axial collection. Vascular: There is atherosclerotic calcification of the major vessels at the base of the brain. Skull: Negative Sinuses/Orbits: Clear/normal Other: None CT CERVICAL SPINE FINDINGS Alignment: Normal Skull base and vertebrae: No  fracture or primary bone lesion. Soft tissues and spinal canal: No significant soft tissue finding. 12 mm cyst or nodule right thyroid lobe. Disc levels: Facet osteoarthritis on the left at C3-4 and C4-5. Degenerative spondylosis at C4-5, C5-6 and C6-7. Mild foraminal narrowing on the left due to osteophytic encroachment at C4-5, C5-6 and C6-7. Minimal encroachment on the right. Upper chest: Negative except for pulmonary scarring. Other: None IMPRESSION: HEAD CT: No acute or traumatic finding. Mild chronic small-vessel ischemic changes of the white matter. CERVICAL SPINE CT: 1. No acute or traumatic finding. Ordinary degenerative spondylosis and facet osteoarthritis. Foraminal narrowing on the left at C4-5, C5-6 and C6-7. 2. 12 mm cyst or nodule right thyroid lobe. No followup recommended (ref: J Am Coll Radiol. 2015 Feb;12(2): 143-50). Electronically Signed   By: Nelson Chimes M.D.   On: 11/21/2020 08:59   CT ABDOMEN PELVIS W CONTRAST  Result Date: 11/22/2020 CLINICAL DATA:  69 year old female with abdominal pain. EXAM: CT ABDOMEN AND PELVIS WITH CONTRAST TECHNIQUE: Multidetector CT imaging of the abdomen and pelvis was performed using the standard protocol following bolus administration of intravenous contrast. CONTRAST:  116mL OMNIPAQUE IOHEXOL 300 MG/ML  SOLN COMPARISON:  Renal ultrasound dated 11/21/2020. FINDINGS: Lower chest: The visualized lung bases are clear. No intra-abdominal free air or free fluid. Hepatobiliary: Mild fatty liver. No intrahepatic biliary dilatation. The gallbladder is unremarkable. Pancreas: Unremarkable. No pancreatic ductal dilatation or surrounding inflammatory changes. Spleen: Normal in size without focal abnormality. Adrenals/Urinary Tract: The adrenal glands unremarkable. There is no hydronephrosis on either side. There is symmetric enhancement and excretion of contrast by both kidneys. The visualized ureters and urinary bladder appear unremarkable. Stomach/Bowel: There is  loose stool throughout the colon compatible with diarrheal state. Correlation with clinical exam and stool cultures recommended. There is mild thickened appearance of the: Which may represent mild colitis. There is no bowel obstruction. Normal appendix. Vascular/Lymphatic: The abdominal aorta and IVC unremarkable. No portal venous gas. There is no adenopathy. Reproductive: Hysterectomy. Other: None Musculoskeletal: Degenerative changes of the spine. No acute osseous pathology. IMPRESSION: 1. Diarrheal state with possible mild colitis. Correlation with clinical exam and stool cultures recommended. No bowel obstruction. Normal appendix. 2. Mild fatty liver. Electronically Signed   By: Anner Crete M.D.   On: 11/22/2020 17:52   US RENAL  Result Date: 11/21/2020 CLINICAL DATA:  Acute renal insufficiency, hypertension EXAM: RENAL / URINARY TRACT ULTRASOUND COMPLETE COMPARISON:  01/11/2019 FINDINGS: Right Kidney: Renal measurements: 10.5 x 4.0 x 4.3 cm = volume: 93.2 mL. Echogenicity within normal limits. No mass or hydronephrosis visualized. Left Kidney: Renal measurements: 12.2 x 6.0 x 5.3 cm = volume: 204.8 mL. Echogenicity within normal limits. No mass or hydronephrosis visualized. Bladder: Appears normal for degree of bladder distention. Other: None. IMPRESSION: 1. Unremarkable renal ultrasound. Electronically Signed   By: Randa Ngo M.D.   On: 11/21/2020 18:11   DG Foot Complete Right  Result Date: 11/18/2020 Please see detailed radiograph report in office note.  ECHOCARDIOGRAM COMPLETE  Result Date: 11/22/2020    ECHOCARDIOGRAM REPORT   Patient Name:   HEATH CAVERLY Date of Exam: 11/22/2020 Medical Rec #:  [id number]         Height:       62.0 in Accession #:    [id number]        Weight:       171.5 lb Date of Birth:  02-09-1951         BSA:          1.791 m Patient Age:    79 years          BP:           108/76 mmHg Patient Gender: F                 HR:           77 bpm. Exam Location:   Inpatient Procedure: 2D Echo, 3D Echo, Cardiac Doppler, Color Doppler and Strain Analysis Indications:    Syncope 780.2 / R55  History:        Patient has no prior history of Echocardiogram examinations.                 Risk Factors:Hypertension.  Sonographer:    Darlina Sicilian RDCS Referring Phys: [id number] [place]  1. Left ventricular ejection fraction, by estimation, is 55 to 60%. The left ventricle has normal function. The left ventricle has no regional wall motion abnormalities. Left ventricular diastolic parameters are consistent with Grade I diastolic dysfunction (impaired relaxation). Elevated left ventricular end-diastolic pressure. The average left ventricular global longitudinal strain is -19.9 %. The global longitudinal strain is normal.  2. Right ventricular systolic function is normal. The right ventricular size is normal. There is normal pulmonary artery systolic pressure.  3. The mitral valve is normal in structure. Trivial mitral valve regurgitation. No evidence of mitral stenosis.  4. The aortic valve is tricuspid. There is mild calcification of the aortic valve. There is mild thickening of the aortic valve. Aortic valve regurgitation is not visualized. No aortic stenosis is present.  5. The inferior vena cava is normal in size with greater than 50% respiratory variability, suggesting right atrial pressure of 3 mmHg. FINDINGS  Left Ventricle: Left ventricular ejection fraction, by estimation, is 55 to 60%. The left ventricle has normal function. The left ventricle has no regional wall motion abnormalities. The average left ventricular global longitudinal strain is -19.9 %. The global longitudinal strain is normal. The left ventricular internal cavity size was normal in size. There is no left ventricular hypertrophy. Left ventricular diastolic parameters are consistent with Grade I diastolic dysfunction (impaired relaxation). Elevated left ventricular end-diastolic pressure. Right  Ventricle: The right ventricular size is normal. No increase in right ventricular wall thickness. Right ventricular systolic function is normal. There is normal pulmonary artery systolic pressure. The tricuspid regurgitant velocity is 2.21 m/s, and  with an assumed right atrial pressure of 3 mmHg, the estimated right ventricular systolic pressure is [id number] mmHg. Left Atrium: Left atrial size was normal in size. Right Atrium: Right atrial size was normal in size. Pericardium: There is no evidence of pericardial effusion. Mitral Valve: The mitral valve is normal in structure. There is mild thickening of the mitral valve leaflet(s). Mild to moderate mitral annular calcification. Trivial mitral valve regurgitation. No evidence of mitral valve stenosis. Tricuspid Valve: The tricuspid valve is normal in structure. Tricuspid valve regurgitation is mild . No evidence of tricuspid stenosis. Aortic Valve:  The aortic valve is tricuspid. There is mild calcification of the aortic valve. There is mild thickening of the aortic valve. Aortic valve regurgitation is not visualized. No aortic stenosis is present. Pulmonic Valve: The pulmonic valve was normal in structure. Pulmonic valve regurgitation is not visualized. No evidence of pulmonic stenosis. Aorta: The aortic root is normal in size and structure. Venous: The inferior vena cava is normal in size with greater than 50% respiratory variability, suggesting right atrial pressure of 3 mmHg. IAS/Shunts: No atrial level shunt detected by color flow Doppler.  LEFT VENTRICLE PLAX 2D LVIDd:         3.90 cm  Diastology LVIDs:         2.70 cm  LV e' medial:    5.33 cm/s LV PW:         0.90 cm  LV E/e' medial:  15.3 LV IVS:        1.00 cm  LV e' lateral:   9.68 cm/s LVOT diam:     2.10 cm  LV E/e' lateral: 8.4 LV SV:         62 LV SV Index:   35       2D Longitudinal Strain LVOT Area:     3.46 cm 2D Strain GLS (A2C):   -19.6 %                         2D Strain GLS (A3C):   -20.3 %                          2D Strain GLS (A4C):   -19.8 %                         2D Strain GLS Avg:     -19.9 % RIGHT VENTRICLE RV S prime:     12.20 cm/s TAPSE (M-mode): 1.9 cm LEFT ATRIUM             Index       RIGHT ATRIUM           Index LA diam:        2.10 cm 1.17 cm/m  RA Area:     11.20 cm LA Vol (A2C):   54.2 ml 30.26 ml/m RA Volume:   21.40 ml  11.95 ml/m LA Vol (A4C):   41.7 ml 23.26 ml/m LA Biplane Vol: 53.4 ml 29.82 ml/m  AORTIC VALVE LVOT Vmax:   83.60 cm/s LVOT Vmean:  56.500 cm/s LVOT VTI:    0.180 m  AORTA Ao Root diam: 2.90 cm Ao Asc diam:  3.15 cm MITRAL VALVE               TRICUSPID VALVE MV Area (PHT): 3.53 cm    TR Peak grad:   19.5 mmHg MV Decel Time: 215 msec    TR Vmax:        221.00 cm/s MV E velocity: 81.40 cm/s MV A velocity: 85.70 cm/s  SHUNTS MV E/A ratio:  0.95        Systemic VTI:  0.18 m                            Systemic Diam: 2.10 cm Skeet Latch MD Electronically signed by Skeet Latch MD Signature Date/Time: 11/22/2020/10:10:31 AM    Final     Subjective: No new complaints.  Discharge Exam: Vitals:   11/26/20 0529 11/26/20 0822  BP: 136/90 127/75  Pulse: 65 64  Resp:  20  Temp: 98.7 F (37.1 C) 98.3 F (36.8 C)  SpO2: 100% 100%   Vitals:   11/25/20 1247 11/25/20 1959 11/26/20 0529 11/26/20 0822  BP: 114/68 134/80 136/90 127/75  Pulse: 68 66 65 64  Resp: 18 14  20   Temp: 97.6 F (36.4 C) 97.8 F (36.6 C) 98.7 F (37.1 C) 98.3 F (36.8 C)  TempSrc:   Oral Oral  SpO2: 100% 100% 100% 100%  Weight:      Height:        General: Pt is alert, awake, not in acute distress Cardiovascular: RRR, S1/S2 +, no rubs, no gallops Respiratory: CTA bilaterally, no wheezing, no rhonchi Abdominal: Soft, NT, ND, bowel sounds + Extremities: no edema, no cyanosis    The results of significant diagnostics from this hospitalization (including imaging, microbiology, ancillary and laboratory) are listed below for reference.     Microbiology: Recent Results  (from the past 240 hour(s))  Urine culture     Status: Abnormal   Collection Time: 11/21/20  8:18 AM   Specimen: Urine, Clean Catch  Result Value Ref Range Status   Specimen Description   Final    URINE, CLEAN CATCH Performed at [facility], [place] [address], [place], [place] [address]    Special Requests   Final    NONE Performed at [facility], [place] [address], [place], [place] [address]    Culture (A)  Final    <10,000 COLONIES/mL INSIGNIFICANT GROWTH Performed at [place] [address], [place], [place] [address]    Report Status 11/22/2020 FINAL  Final  C Difficile Quick Screen w PCR reflex     Status: None   Collection Time: 11/21/20 12:30 PM   Specimen: STOOL  Result Value Ref Range Status   C Diff antigen NEGATIVE NEGATIVE Final   C Diff toxin NEGATIVE NEGATIVE Final   C Diff interpretation No C. difficile detected.  Final    Comment: Performed at [facility], [place] [address] [place], [place] [address]  Resp Panel by RT-PCR (Flu A&B, Covid) Nasopharyngeal Swab     Status: None   Collection Time: 11/21/20  2:17 PM   Specimen: Nasopharyngeal Swab; Nasopharyngeal(NP) swabs in vial transport medium  Result Value Ref Range Status   SARS Coronavirus 2 by RT PCR NEGATIVE NEGATIVE Final    Comment: (NOTE) SARS-CoV-2 target nucleic acids are NOT DETECTED.  The SARS-CoV-2 RNA is generally detectable in upper respiratory specimens during the acute phase of infection. The lowest concentration of SARS-CoV-2 viral copies this assay can detect is 138 copies/mL. A negative result does not preclude SARS-Cov-2 infection and should not be used as the sole basis for treatment or other patient management decisions. A negative result may occur with  improper specimen collection/handling, submission of specimen other than nasopharyngeal swab, presence of viral mutation(s) within the areas targeted by this assay, and  inadequate number of viral copies(<138 copies/mL). A negative result must be combined with clinical observations, patient history, and epidemiological information. The expected result is Negative.  Fact Sheet for Patients:  EntrepreneurPulse.com.au  Fact Sheet for Healthcare Providers:  IncredibleEmployment.be  This test is no t yet approved or cleared by the [place] FDA and  has been authorized for detection and/or diagnosis of SARS-CoV-2 by FDA under an Emergency Use Authorization (EUA). This EUA will remain  in effect (meaning this test can be used) for the duration of the COVID-19 declaration under Section 564(b)(1) of the Act, 21 U.S.C.section 360bbb-3(b)(1), unless the authorization is terminated  or revoked sooner.       Influenza A by PCR NEGATIVE NEGATIVE Final   Influenza B by PCR NEGATIVE NEGATIVE Final    Comment: (NOTE) The Xpert Xpress SARS-CoV-2/FLU/RSV plus assay is intended as an aid in the diagnosis of influenza from Nasopharyngeal swab specimens and should not be used as a sole basis for treatment. Nasal washings and aspirates are unacceptable for Xpert Xpress SARS-CoV-2/FLU/RSV testing.  Fact Sheet for Patients: BloggerCourse.com  Fact Sheet for Healthcare Providers: SeriousBroker.it  This test is not yet approved or cleared by the [place] FDA and has been authorized for detection and/or diagnosis of SARS-CoV-2 by FDA under an Emergency Use Authorization (EUA). This EUA will remain in effect (meaning this test can be used) for the duration of the COVID-19 declaration under Section 564(b)(1) of the Act, 21 U.S.C. section 360bbb-3(b)(1), unless the authorization is terminated or revoked.  Performed at [facility], [address], [place], [place] [address]   Gastrointestinal Panel by PCR , Stool     Status: None   Collection Time:  11/23/20  1:10 PM   Specimen: Stool  Result Value Ref Range Status   Campylobacter species NOT DETECTED NOT DETECTED Final   Plesimonas shigelloides NOT DETECTED NOT DETECTED Final   Salmonella species NOT DETECTED NOT DETECTED Final   Yersinia enterocolitica NOT DETECTED NOT DETECTED Final   Vibrio species NOT DETECTED NOT DETECTED Final   Vibrio cholerae NOT DETECTED NOT DETECTED Final   Enteroaggregative E coli (EAEC) NOT DETECTED NOT DETECTED Final   Enteropathogenic E coli (EPEC) NOT DETECTED NOT DETECTED Final   Enterotoxigenic E coli (ETEC) NOT DETECTED NOT DETECTED Final   Shiga like toxin producing E coli (STEC) NOT DETECTED NOT DETECTED Final   Shigella/Enteroinvasive E coli (EIEC) NOT DETECTED NOT DETECTED Final   Cryptosporidium NOT DETECTED NOT DETECTED Final   Cyclospora cayetanensis NOT DETECTED NOT DETECTED Final   Entamoeba histolytica NOT DETECTED NOT DETECTED Final   Giardia lamblia NOT DETECTED NOT DETECTED Final   Adenovirus F40/41 NOT DETECTED NOT DETECTED Final   Astrovirus NOT DETECTED NOT DETECTED Final   Norovirus GI/GII NOT DETECTED NOT DETECTED Final   Rotavirus A NOT DETECTED NOT DETECTED Final   Sapovirus (I, II, IV, and V) NOT DETECTED NOT DETECTED Final    Comment: Performed at [facility], [address] [place], [place] [address]     Labs: BNP (last 3 results) No results for input(s): BNP in the last 8760 hours. Basic Metabolic Panel: Recent Labs  Lab 11/21/20 2014 11/22/20 0610 11/23/20 0533 11/23/20 1929 11/25/20 0814  NA 136 136 136 136 137  K 3.3* 3.4* 3.1* 2.9* 3.9  CL 105 106 104 107 105  CO2 19* 17* 20* 18* 22  GLUCOSE 102* 93 97 133* 88  BUN 7* 7* 6* <5* <5*  CREATININE 0.62 0.55 0.51 0.57 0.57  CALCIUM 9.2 8.9 9.4 8.9 9.6  MG  --   --  1.9 1.6* 2.0  PHOS  --   --  3.0  --   --    Liver Function Tests: Recent Labs  Lab 11/21/20 0806 11/22/20 0610  AST 37 31  ALT 23 22  ALKPHOS 85 79  BILITOT 0.7 0.5  PROT  7.4 6.8  ALBUMIN 4.5 4.1   Recent  Labs  Lab 11/21/20 0806  LIPASE 113*   No results for input(s): AMMONIA in the last 168 hours. CBC: Recent Labs  Lab 11/21/20 0806 11/22/20 0610  WBC 5.0 4.2  NEUTROABS 3.3 2.0  HGB 13.1 12.8  HCT 36.2 37.6  MCV 91.6 95.2  PLT 275 243   Cardiac Enzymes: No results for input(s): CKTOTAL, CKMB, CKMBINDEX, TROPONINI in the last 168 hours. BNP: Invalid input(s): POCBNP CBG: Recent Labs  Lab 11/22/20 0419 11/23/20 0516 11/24/20 0744 11/25/20 0750 11/26/20 0528  GLUCAP 81 99 83 89 96   D-Dimer No results for input(s): DDIMER in the last 72 hours. Hgb A1c No results for input(s): HGBA1C in the last 72 hours. Lipid Profile No results for input(s): CHOL, HDL, LDLCALC, TRIG, CHOLHDL, LDLDIRECT in the last 72 hours. Thyroid function studies No results for input(s): TSH, T4TOTAL, T3FREE, THYROIDAB in the last 72 hours.  Invalid input(s): FREET3 Anemia work up No results for input(s): VITAMINB12, FOLATE, FERRITIN, TIBC, IRON, RETICCTPCT in the last 72 hours. Urinalysis    Component Value Date/Time   COLORURINE YELLOW 11/21/2020 0818   APPEARANCEUR CLEAR 11/21/2020 0818   LABSPEC 1.008 11/21/2020 0818   LABSPEC 1.020 11/11/2020 1657   PHURINE 6.0 11/21/2020 0818   GLUCOSEU NEGATIVE 11/21/2020 0818   HGBUR NEGATIVE 11/21/2020 0818   BILIRUBINUR NEGATIVE 11/21/2020 0818   BILIRUBINUR negative 11/11/2020 1657   BILIRUBINUR n 04/13/2017 0932   KETONESUR NEGATIVE 11/21/2020 0818   PROTEINUR NEGATIVE 11/21/2020 0818   UROBILINOGEN negative (A) 04/13/2017 0932   UROBILINOGEN 1.0 05/01/2012 1123   NITRITE NEGATIVE 11/21/2020 0818   LEUKOCYTESUR NEGATIVE 11/21/2020 0818   Sepsis Labs Invalid input(s): PROCALCITONIN,  WBC,  LACTICIDVEN Microbiology Recent Results (from the past 240 hour(s))  Urine culture     Status: Abnormal   Collection Time: 11/21/20  8:18 AM   Specimen: Urine, Clean Catch  Result Value Ref Range Status    Specimen Description   Final    URINE, CLEAN CATCH Performed at [facility], [place] [address] [place], [place] [address]    Special Requests   Final    NONE Performed at [facility], [place] [address], [place], [place] [address]    Culture (A)  Final    <10,000 COLONIES/mL INSIGNIFICANT GROWTH Performed at [place] [address] [place], [place] [address]    Report Status 11/22/2020 FINAL  Final  C Difficile Quick Screen w PCR reflex     Status: None   Collection Time: 11/21/20 12:30 PM   Specimen: STOOL  Result Value Ref Range Status   C Diff antigen NEGATIVE NEGATIVE Final   C Diff toxin NEGATIVE NEGATIVE Final   C Diff interpretation No C. difficile detected.  Final    Comment: Performed at [facility], [place] [address], [place],  [address]  Resp Panel by RT-PCR (Flu A&B, Covid) Nasopharyngeal Swab     Status: None   Collection Time: 11/21/20  2:17 PM   Specimen: Nasopharyngeal Swab; Nasopharyngeal(NP) swabs in vial transport medium  Result Value Ref Range Status   SARS Coronavirus 2 by RT PCR NEGATIVE NEGATIVE Final    Comment: (NOTE) SARS-CoV-2 target nucleic acids are NOT DETECTED.  The SARS-CoV-2 RNA is generally detectable in upper respiratory specimens during the acute phase of infection. The lowest concentration of SARS-CoV-2 viral copies this assay can detect is 138 copies/mL. A negative result does not preclude SARS-Cov-2 infection and should not be used as  the sole basis for treatment or other patient management decisions. A negative result may occur with  improper specimen collection/handling, submission of specimen other than nasopharyngeal swab, presence of viral mutation(s) within the areas targeted by this assay, and inadequate number of viral copies(<138 copies/mL). A negative result must be combined with clinical observations, patient history, and epidemiological information. The  expected result is Negative.  Fact Sheet for Patients:  EntrepreneurPulse.com.au  Fact Sheet for Healthcare Providers:  IncredibleEmployment.be  This test is no t yet approved or cleared by the [place] FDA and  has been authorized for detection and/or diagnosis of SARS-CoV-2 by FDA under an Emergency Use Authorization (EUA). This EUA will remain  in effect (meaning this test can be used) for the duration of the COVID-19 declaration under Section 564(b)(1) of the Act, 21 U.S.C.section 360bbb-3(b)(1), unless the authorization is terminated  or revoked sooner.       Influenza A by PCR NEGATIVE NEGATIVE Final   Influenza B by PCR NEGATIVE NEGATIVE Final    Comment: (NOTE) The Xpert Xpress SARS-CoV-2/FLU/RSV plus assay is intended as an aid in the diagnosis of influenza from Nasopharyngeal swab specimens and should not be used as a sole basis for treatment. Nasal washings and aspirates are unacceptable for Xpert Xpress SARS-CoV-2/FLU/RSV testing.  Fact Sheet for Patients: EntrepreneurPulse.com.au  Fact Sheet for Healthcare Providers: IncredibleEmployment.be  This test is not yet approved or cleared by the [place] FDA and has been authorized for detection and/or diagnosis of SARS-CoV-2 by FDA under an Emergency Use Authorization (EUA). This EUA will remain in effect (meaning this test can be used) for the duration of the COVID-19 declaration under Section 564(b)(1) of the Act, 21 U.S.C. section 360bbb-3(b)(1), unless the authorization is terminated or revoked.  Performed at [facility], [place] [address] [place], [place] [address]   Gastrointestinal Panel by PCR , Stool     Status: None   Collection Time: 11/23/20  1:10 PM   Specimen: Stool  Result Value Ref Range Status   Campylobacter species NOT DETECTED NOT DETECTED Final   Plesimonas shigelloides NOT DETECTED NOT  DETECTED Final   Salmonella species NOT DETECTED NOT DETECTED Final   Yersinia enterocolitica NOT DETECTED NOT DETECTED Final   Vibrio species NOT DETECTED NOT DETECTED Final   Vibrio cholerae NOT DETECTED NOT DETECTED Final   Enteroaggregative E coli (EAEC) NOT DETECTED NOT DETECTED Final   Enteropathogenic E coli (EPEC) NOT DETECTED NOT DETECTED Final   Enterotoxigenic E coli (ETEC) NOT DETECTED NOT DETECTED Final   Shiga like toxin producing E coli (STEC) NOT DETECTED NOT DETECTED Final   Shigella/Enteroinvasive E coli (EIEC) NOT DETECTED NOT DETECTED Final   Cryptosporidium NOT DETECTED NOT DETECTED Final   Cyclospora cayetanensis NOT DETECTED NOT DETECTED Final   Entamoeba histolytica NOT DETECTED NOT DETECTED Final   Giardia lamblia NOT DETECTED NOT DETECTED Final   Adenovirus F40/41 NOT DETECTED NOT DETECTED Final   Astrovirus NOT DETECTED NOT DETECTED Final   Norovirus GI/GII NOT DETECTED NOT DETECTED Final   Rotavirus A NOT DETECTED NOT DETECTED Final   Sapovirus (I, II, IV, and V) NOT DETECTED NOT DETECTED Final    Comment: Performed at [facility], [address], [place], [place] [address]     Time coordinating discharge: 36 minutes.   SIGNED:   Hosie Poisson, MD  [facility] 11/26/2020, 8:53 AM

## 2020-11-26 NOTE — Progress Notes (Signed)
Pt to be discharged to home this afternoon. Discharge teaching including Medications and schedules for these Medications lsited on the AVS reviewed with the Pt. Pt verbalized understanding of all discharge teaching

## 2020-12-04 ENCOUNTER — Encounter: Payer: Self-pay | Admitting: Family Medicine

## 2020-12-04 ENCOUNTER — Ambulatory Visit (INDEPENDENT_AMBULATORY_CARE_PROVIDER_SITE_OTHER): Payer: Medicare Other | Admitting: Family Medicine

## 2020-12-04 ENCOUNTER — Other Ambulatory Visit: Payer: Self-pay

## 2020-12-04 VITALS — BP 124/78 | HR 93 | Temp 97.5°F | Wt 171.4 lb

## 2020-12-04 DIAGNOSIS — E876 Hypokalemia: Secondary | ICD-10-CM

## 2020-12-04 DIAGNOSIS — K529 Noninfective gastroenteritis and colitis, unspecified: Secondary | ICD-10-CM

## 2020-12-04 DIAGNOSIS — I10 Essential (primary) hypertension: Secondary | ICD-10-CM | POA: Diagnosis not present

## 2020-12-04 LAB — COMPREHENSIVE METABOLIC PANEL
ALT: 21 IU/L (ref 0–32)
AST: 25 IU/L (ref 0–40)
Albumin/Globulin Ratio: 2 (ref 1.2–2.2)
Albumin: 4.4 g/dL (ref 3.8–4.8)
Alkaline Phosphatase: 91 IU/L (ref 44–121)
BUN/Creatinine Ratio: 6 — ABNORMAL LOW (ref 12–28)
BUN: 4 mg/dL — ABNORMAL LOW (ref 8–27)
Bilirubin Total: 0.5 mg/dL (ref 0.0–1.2)
CO2: 23 mmol/L (ref 20–29)
Calcium: 10 mg/dL (ref 8.7–10.3)
Chloride: 103 mmol/L (ref 96–106)
Creatinine, Ser: 0.67 mg/dL (ref 0.57–1.00)
GFR calc Af Amer: 104 mL/min/{1.73_m2} (ref >59)
GFR calc non Af Amer: 90 mL/min/{1.73_m2} (ref >59)
Globulin, Total: 2.2 g/dL (ref 1.5–4.5)
Glucose: 96 mg/dL (ref 65–99)
Potassium: 3.8 mmol/L (ref 3.5–5.2)
Sodium: 141 mmol/L (ref 134–144)
Total Protein: 6.6 g/dL (ref 6.0–8.5)

## 2020-12-04 LAB — CBC WITH DIFFERENTIAL/PLATELET
Basophils Absolute: 0.1 10*3/uL (ref 0.0–0.2)
Basos: 1 %
EOS (ABSOLUTE): 0.1 10*3/uL (ref 0.0–0.4)
Eos: 2 %
Hematocrit: 36.4 % (ref 34.0–46.6)
Hemoglobin: 12.8 g/dL (ref 11.1–15.9)
Immature Grans (Abs): 0 10*3/uL (ref 0.0–0.1)
Immature Granulocytes: 0 %
Lymphocytes Absolute: 1.4 10*3/uL (ref 0.7–3.1)
Lymphs: 26 %
MCH: 32.2 pg (ref 26.6–33.0)
MCHC: 35.2 g/dL (ref 31.5–35.7)
MCV: 92 fL (ref 79–97)
Monocytes Absolute: 0.9 10*3/uL (ref 0.1–0.9)
Monocytes: 18 %
Neutrophils Absolute: 2.8 10*3/uL (ref 1.4–7.0)
Neutrophils: 53 %
Platelets: 419 10*3/uL (ref 150–450)
RBC: 3.98 x10E6/uL (ref 3.77–5.28)
RDW: 11.4 % — ABNORMAL LOW (ref 11.7–15.4)
WBC: 5.3 10*3/uL (ref 3.4–10.8)

## 2020-12-04 NOTE — Progress Notes (Signed)
   Subjective:    Patient ID: Kathy [name], female    DOB: [birth year], 70 y.o.   MRN: [id number]  HPI She is here for posthospitalization visit.  She was admitted on 23 December and sent home on the 28th.  The hospital record including admission H&P and discharge summary was reviewed.  It did show that she had a colitis as well as hypokalemia.  She did have continued difficulty with diarrhea which necessitated her being held on for several days.  Presently she is doing better but still feels some weakness as well as some loose stools.   Review of Systems     Objective:   Physical Exam Alert and in no distress.  Cardiac exam shows regular rhythm without murmurs or gallops.  Lungs are clear to auscultation.  Abdominal exam shows no masses or tenderness. The hospital discharge summary, blood work and x-rays were evaluated.      Assessment & Plan:  Gastroenteritis - Plan: CBC with Differential/Platelet, Comprehensive metabolic panel  Essential hypertension - Plan: CBC with Differential/Platelet, Comprehensive metabolic panel  Hypokalemia - Plan: CBC with Differential/Platelet, Comprehensive metabolic panel

## 2020-12-05 ENCOUNTER — Encounter: Payer: Medicare Other | Admitting: Podiatry

## 2020-12-12 ENCOUNTER — Encounter: Payer: Medicare Other | Admitting: Podiatry

## 2020-12-18 ENCOUNTER — Other Ambulatory Visit: Payer: Self-pay | Admitting: Podiatry

## 2020-12-18 ENCOUNTER — Encounter: Payer: Self-pay | Admitting: Podiatry

## 2020-12-18 DIAGNOSIS — M19071 Primary osteoarthritis, right ankle and foot: Secondary | ICD-10-CM | POA: Diagnosis not present

## 2020-12-18 DIAGNOSIS — I10 Essential (primary) hypertension: Secondary | ICD-10-CM | POA: Diagnosis not present

## 2020-12-18 DIAGNOSIS — M2021 Hallux rigidus, right foot: Secondary | ICD-10-CM | POA: Diagnosis not present

## 2020-12-18 MED ORDER — TRAMADOL HCL 50 MG PO TABS: 50.0000 mg | ORAL_TABLET | Freq: Three times a day (TID) | ORAL | 0 refills | Status: AC | PRN

## 2020-12-18 MED ORDER — CEPHALEXIN 500 MG PO CAPS: 500.0000 mg | ORAL_CAPSULE | Freq: Three times a day (TID) | ORAL | 0 refills | Status: DC

## 2020-12-18 MED ORDER — IBUPROFEN 600 MG PO TABS: 600.0000 mg | ORAL_TABLET | Freq: Three times a day (TID) | ORAL | 0 refills | Status: DC | PRN

## 2020-12-18 MED ORDER — ONDANSETRON HCL 4 MG PO TABS: 4.0000 mg | ORAL_TABLET | Freq: Three times a day (TID) | ORAL | 0 refills | Status: DC | PRN

## 2020-12-18 NOTE — Progress Notes (Signed)
Postop medications sent 

## 2020-12-20 ENCOUNTER — Telehealth: Payer: Self-pay | Admitting: *Deleted

## 2020-12-20 NOTE — Telephone Encounter (Signed)
Called and spoke with the patient and stated that I was calling to see how the patient was doing after having surgery with Dr Jacqualyn Posey on 12/18/2020 and patient stated that yesterday was the worst day and the pain scale was a 10 and did take the pain medicine at 9 am and could wiggle the toes and there was not any fever or chills and not any nausea and patient was using the ice pack and was elevating and I could wiggle my toes and I stated to call the office if any concerns or questions. Lattie Haw

## 2020-12-21 DIAGNOSIS — I10 Essential (primary) hypertension: Secondary | ICD-10-CM | POA: Diagnosis not present

## 2020-12-21 DIAGNOSIS — M2021 Hallux rigidus, right foot: Secondary | ICD-10-CM | POA: Diagnosis not present

## 2020-12-21 DIAGNOSIS — Z4789 Encounter for other orthopedic aftercare: Secondary | ICD-10-CM | POA: Diagnosis not present

## 2020-12-21 DIAGNOSIS — R262 Difficulty in walking, not elsewhere classified: Secondary | ICD-10-CM | POA: Diagnosis not present

## 2020-12-21 DIAGNOSIS — M19071 Primary osteoarthritis, right ankle and foot: Secondary | ICD-10-CM | POA: Diagnosis not present

## 2020-12-23 ENCOUNTER — Other Ambulatory Visit: Payer: Self-pay

## 2020-12-23 ENCOUNTER — Ambulatory Visit (INDEPENDENT_AMBULATORY_CARE_PROVIDER_SITE_OTHER): Payer: Medicare Other | Admitting: Podiatry

## 2020-12-23 ENCOUNTER — Ambulatory Visit (INDEPENDENT_AMBULATORY_CARE_PROVIDER_SITE_OTHER): Payer: Medicare Other

## 2020-12-23 DIAGNOSIS — M2021 Hallux rigidus, right foot: Secondary | ICD-10-CM

## 2020-12-24 ENCOUNTER — Other Ambulatory Visit: Payer: Self-pay | Admitting: Podiatry

## 2020-12-24 DIAGNOSIS — M2021 Hallux rigidus, right foot: Secondary | ICD-10-CM | POA: Diagnosis not present

## 2020-12-24 DIAGNOSIS — R262 Difficulty in walking, not elsewhere classified: Secondary | ICD-10-CM | POA: Diagnosis not present

## 2020-12-24 DIAGNOSIS — I10 Essential (primary) hypertension: Secondary | ICD-10-CM | POA: Diagnosis not present

## 2020-12-24 DIAGNOSIS — Z4789 Encounter for other orthopedic aftercare: Secondary | ICD-10-CM | POA: Diagnosis not present

## 2020-12-24 DIAGNOSIS — M19071 Primary osteoarthritis, right ankle and foot: Secondary | ICD-10-CM | POA: Diagnosis not present

## 2020-12-25 ENCOUNTER — Telehealth: Payer: Self-pay | Admitting: Podiatry

## 2020-12-25 ENCOUNTER — Other Ambulatory Visit: Payer: Self-pay | Admitting: Podiatry

## 2020-12-25 MED ORDER — ONDANSETRON HCL 4 MG PO TABS: 4.0000 mg | ORAL_TABLET | Freq: Three times a day (TID) | ORAL | 0 refills | Status: DC | PRN

## 2020-12-25 MED ORDER — IBUPROFEN 600 MG PO TABS: 600.0000 mg | ORAL_TABLET | Freq: Three times a day (TID) | ORAL | 0 refills | Status: DC | PRN

## 2020-12-25 NOTE — Telephone Encounter (Signed)
I sent them over except the antibiotic as she does not need to continue this.

## 2020-12-25 NOTE — Telephone Encounter (Signed)
1. cephALEXin (KEFLEX)  2. ibuprofen (ADVIL) 3.ondansetron [facility]   Prescriptions that has been requested, patient called back requesting meds for constipation stating it was surgery related, Please Advise

## 2020-12-25 NOTE — Progress Notes (Signed)
Refilled medications

## 2020-12-25 NOTE — Telephone Encounter (Signed)
Patient has requested refill on medications, Please Advise

## 2020-12-25 NOTE — Telephone Encounter (Signed)
Please advise 

## 2020-12-25 NOTE — Progress Notes (Signed)
Subjective: Kathy [name] is a 70 y.o. is seen today in office s/p right 1st MTPJ arthrodesis preformed on 12/18/2020. She states that her pain in improving and is controlled at this time. She has been NWB in the CAM boot and only putting her foot down for balance/transfers.  Denies any systemic complaints such as fevers, chills, nausea, vomiting. No calf pain, chest pain, shortness of breath.   Objective: General: No acute distress, AAOx3  DP/PT pulses palpable 2/4, CRT < 3 sec to all digits.  Protective sensation intact. Motor function intact.  RIGHT foot: Incision is well coapted without any evidence of dehiscence with suture intact. There is no surrounding erythema, ascending cellulitis, fluctuance, crepitus, malodor, drainage/purulence. There is mild edema around the surgical site. There is minimal pain along the surgical site. Toe is in rectus position. No signs of infection No other areas of tenderness to bilateral lower extremities.  No other open lesions or pre-ulcerative lesions.  No pain with calf compression, swelling, warmth, erythema.   Assessment and Plan:  Status post right foot surgery, doing well with no complications   -Treatment options discussed including all alternatives, risks, and complications -X-rays obtained and reviewed.  Status post first MPJ arthrodesis by evidence of acute fracture complicating factors of the hardware. -Antibiotic ointment followed by dressing was applied.  Keep the dressing clean, dry, intact -NWB in CAM boot but also discussed that she can put her weight down for balance and transfers if needed. -Recommend 81 mg aspirin daily. -Ice/elevation -Pain medication as needed. -Monitor for any clinical signs or symptoms of infection and DVT/PE and directed to call the office immediately should any occur or go to the ER. -Follow-up as scheduled or sooner if any problems arise. In the meantime, encouraged to call the office with any questions,  concerns, change in symptoms.   Celesta Gentile, DPM

## 2020-12-26 ENCOUNTER — Encounter: Payer: Medicare Other | Admitting: Podiatry

## 2020-12-26 ENCOUNTER — Telehealth: Payer: Self-pay | Admitting: Podiatry

## 2020-12-26 NOTE — Telephone Encounter (Signed)
Called patient to let her know meds has been called into pharmacy

## 2020-12-27 ENCOUNTER — Telehealth: Payer: Self-pay | Admitting: *Deleted

## 2020-12-27 DIAGNOSIS — M19071 Primary osteoarthritis, right ankle and foot: Secondary | ICD-10-CM | POA: Diagnosis not present

## 2020-12-27 DIAGNOSIS — R262 Difficulty in walking, not elsewhere classified: Secondary | ICD-10-CM | POA: Diagnosis not present

## 2020-12-27 DIAGNOSIS — M2021 Hallux rigidus, right foot: Secondary | ICD-10-CM | POA: Diagnosis not present

## 2020-12-27 DIAGNOSIS — Z4789 Encounter for other orthopedic aftercare: Secondary | ICD-10-CM | POA: Diagnosis not present

## 2020-12-27 DIAGNOSIS — I10 Essential (primary) hypertension: Secondary | ICD-10-CM | POA: Diagnosis not present

## 2020-12-27 NOTE — Telephone Encounter (Signed)
Called and spoke with patient , informed per Dr Leigh Aurora message. She verbalized understanding and said that [facility] came and fixed the boot, but for some reason it sometimes get too inflated,it is better now.she will continue to ice and elevate as instructed.

## 2020-12-27 NOTE — Telephone Encounter (Signed)
Kathy [name] is calling because patient is complaining of foot pain due to wearing Cam boot.Can she come out of the boot for periods of time. Please advise.

## 2020-12-27 NOTE — Telephone Encounter (Signed)
Please let her know she can come out of the boot is she is icing and elevating her foot. Otherwise I would like for her to wear it. Please talk to her to make sure that the boot is not inflated too much which can cause pressure.

## 2020-12-31 DIAGNOSIS — M2021 Hallux rigidus, right foot: Secondary | ICD-10-CM | POA: Diagnosis not present

## 2020-12-31 DIAGNOSIS — R262 Difficulty in walking, not elsewhere classified: Secondary | ICD-10-CM | POA: Diagnosis not present

## 2020-12-31 DIAGNOSIS — M19071 Primary osteoarthritis, right ankle and foot: Secondary | ICD-10-CM | POA: Diagnosis not present

## 2020-12-31 DIAGNOSIS — I10 Essential (primary) hypertension: Secondary | ICD-10-CM | POA: Diagnosis not present

## 2020-12-31 DIAGNOSIS — Z4789 Encounter for other orthopedic aftercare: Secondary | ICD-10-CM | POA: Diagnosis not present

## 2021-01-02 ENCOUNTER — Other Ambulatory Visit: Payer: Self-pay

## 2021-01-02 ENCOUNTER — Ambulatory Visit (INDEPENDENT_AMBULATORY_CARE_PROVIDER_SITE_OTHER): Payer: Medicare Other | Admitting: Podiatry

## 2021-01-02 ENCOUNTER — Encounter: Payer: Self-pay | Admitting: Podiatry

## 2021-01-02 DIAGNOSIS — Z4789 Encounter for other orthopedic aftercare: Secondary | ICD-10-CM | POA: Diagnosis not present

## 2021-01-02 DIAGNOSIS — Z9889 Other specified postprocedural states: Secondary | ICD-10-CM

## 2021-01-02 DIAGNOSIS — M2021 Hallux rigidus, right foot: Secondary | ICD-10-CM | POA: Diagnosis not present

## 2021-01-02 DIAGNOSIS — M19071 Primary osteoarthritis, right ankle and foot: Secondary | ICD-10-CM | POA: Diagnosis not present

## 2021-01-02 DIAGNOSIS — I10 Essential (primary) hypertension: Secondary | ICD-10-CM | POA: Diagnosis not present

## 2021-01-02 DIAGNOSIS — R262 Difficulty in walking, not elsewhere classified: Secondary | ICD-10-CM | POA: Diagnosis not present

## 2021-01-05 NOTE — Progress Notes (Signed)
Subjective: Kathy [name] is a 70 y.o. is seen today in office s/p right 1st MTPJ arthrodesis preformed on 12/18/2020.  Overall she is feeling better.  The pain is better than what it was as well.  She denies any recent injury or falls and she has no other changes today or concerns.  Current denies any fevers, chills, nausea, vomiting.  No calf pain, chest pain, shortness of breath.    Objective: General: No acute distress, AAOx3  DP/PT pulses palpable 2/4, CRT < 3 sec to all digits.  Protective sensation intact. Motor function intact.  RIGHT foot: Incision is well coapted without any evidence of dehiscence with suture intact.  There is mild edema to the surgical site.  There is no erythema or warmth to the arthrodesis site appears to be stable.  No other areas of discomfort identified today.  The toe is in rectus position. No other open lesions or pre-ulcerative lesions.  No pain with calf compression, swelling, warmth, erythema.   Assessment and Plan:  Status post right foot surgery, doing well with no complications   -Treatment options discussed including all alternatives, risks, and complications -Incision was clean.  Medical ointment followed by dressing was applied.  She can start to wash the area soap and water and dry thoroughly and apply a similar bandage.  Continue cam boot, nonweightbearing for now.  Discussed that she can put weight on the foot for transfers and only short distances. -Continue home physical therapy.  Also will try to get a nurse after having the house for her. -Ice/elevation -Pain medication as needed -Monitor for any clinical signs or symptoms of infection and directed to call the office immediately should any occur or go to the ER.   Trula Slade DPM  -X-rays obtained and reviewed.  Status post first MPJ arthrodesis by evidence of acute fracture complicating factors of the hardware. -Antibiotic ointment followed by dressing was applied.  Keep the  dressing clean, dry, intact -NWB in CAM boot but also discussed that she can put her weight down for balance and transfers if needed. -Recommend 81 mg aspirin daily. -Ice/elevation -Pain medication as needed. -Monitor for any clinical signs or symptoms of infection and DVT/PE and directed to call the office immediately should any occur or go to the ER. -Follow-up as scheduled or sooner if any problems arise. In the meantime, encouraged to call the office with any questions, concerns, change in symptoms.   Celesta Gentile, DPM

## 2021-01-07 ENCOUNTER — Other Ambulatory Visit: Payer: Self-pay | Admitting: Podiatry

## 2021-01-07 DIAGNOSIS — R262 Difficulty in walking, not elsewhere classified: Secondary | ICD-10-CM | POA: Diagnosis not present

## 2021-01-07 DIAGNOSIS — M19071 Primary osteoarthritis, right ankle and foot: Secondary | ICD-10-CM | POA: Diagnosis not present

## 2021-01-07 DIAGNOSIS — I10 Essential (primary) hypertension: Secondary | ICD-10-CM | POA: Diagnosis not present

## 2021-01-07 DIAGNOSIS — M2021 Hallux rigidus, right foot: Secondary | ICD-10-CM | POA: Diagnosis not present

## 2021-01-07 DIAGNOSIS — Z4789 Encounter for other orthopedic aftercare: Secondary | ICD-10-CM | POA: Diagnosis not present

## 2021-01-07 NOTE — Progress Notes (Signed)
Referral to [facility] and PT ordered

## 2021-01-09 ENCOUNTER — Telehealth: Payer: Self-pay | Admitting: Podiatry

## 2021-01-09 DIAGNOSIS — M19071 Primary osteoarthritis, right ankle and foot: Secondary | ICD-10-CM | POA: Diagnosis not present

## 2021-01-09 DIAGNOSIS — I10 Essential (primary) hypertension: Secondary | ICD-10-CM | POA: Diagnosis not present

## 2021-01-09 DIAGNOSIS — M2021 Hallux rigidus, right foot: Secondary | ICD-10-CM | POA: Diagnosis not present

## 2021-01-09 DIAGNOSIS — Z4789 Encounter for other orthopedic aftercare: Secondary | ICD-10-CM | POA: Diagnosis not present

## 2021-01-09 DIAGNOSIS — R262 Difficulty in walking, not elsewhere classified: Secondary | ICD-10-CM | POA: Diagnosis not present

## 2021-01-09 NOTE — Telephone Encounter (Signed)
Ok thanks! I didn't know that

## 2021-01-09 NOTE — Telephone Encounter (Signed)
Kathy [name] wanted to let to you that the patient told her that the patient had a fall on Sunday, Jan 30th. She fell to the ground when trying to answer the door. She did tell her that she saw you the next day and you did xrays. She just wanted to make sure this was communicated and documented.

## 2021-01-13 ENCOUNTER — Emergency Department (HOSPITAL_COMMUNITY): Payer: Medicare Other

## 2021-01-13 ENCOUNTER — Other Ambulatory Visit: Payer: Self-pay

## 2021-01-13 ENCOUNTER — Encounter (HOSPITAL_COMMUNITY): Payer: Self-pay

## 2021-01-13 ENCOUNTER — Observation Stay (HOSPITAL_COMMUNITY)
Admit: 2021-01-13 | Discharge: 2021-01-13 | Disposition: A | Discharge status: Home / Self Care | Payer: Medicare Other | Attending: Internal Medicine | Admitting: Internal Medicine

## 2021-01-13 ENCOUNTER — Inpatient Hospital Stay (HOSPITAL_COMMUNITY)
Admission: EM | Admit: 2021-01-13 | Discharge: 2021-01-18 | DRG: 312 | Disposition: A | Discharge status: Home / Self Care | Payer: Medicare Other | Attending: Internal Medicine | Admitting: Internal Medicine

## 2021-01-13 DIAGNOSIS — M797 Fibromyalgia: Secondary | ICD-10-CM | POA: Diagnosis not present

## 2021-01-13 DIAGNOSIS — E876 Hypokalemia: Secondary | ICD-10-CM | POA: Diagnosis present

## 2021-01-13 DIAGNOSIS — I10 Essential (primary) hypertension: Secondary | ICD-10-CM

## 2021-01-13 DIAGNOSIS — R4020 Unspecified coma: Secondary | ICD-10-CM | POA: Diagnosis present

## 2021-01-13 DIAGNOSIS — Z8249 Family history of ischemic heart disease and other diseases of the circulatory system: Secondary | ICD-10-CM

## 2021-01-13 DIAGNOSIS — J189 Pneumonia, unspecified organism: Secondary | ICD-10-CM | POA: Diagnosis present

## 2021-01-13 DIAGNOSIS — I110 Hypertensive heart disease with heart failure: Secondary | ICD-10-CM | POA: Diagnosis not present

## 2021-01-13 DIAGNOSIS — M2021 Hallux rigidus, right foot: Secondary | ICD-10-CM | POA: Diagnosis not present

## 2021-01-13 DIAGNOSIS — E538 Deficiency of other specified B group vitamins: Secondary | ICD-10-CM | POA: Diagnosis not present

## 2021-01-13 DIAGNOSIS — R55 Syncope and collapse: Secondary | ICD-10-CM

## 2021-01-13 DIAGNOSIS — S0990XA Unspecified injury of head, initial encounter: Secondary | ICD-10-CM | POA: Diagnosis not present

## 2021-01-13 DIAGNOSIS — T502X5A Adverse effect of carbonic-anhydrase inhibitors, benzothiadiazides and other diuretics, initial encounter: Secondary | ICD-10-CM | POA: Diagnosis present

## 2021-01-13 DIAGNOSIS — R404 Transient alteration of awareness: Secondary | ICD-10-CM | POA: Diagnosis not present

## 2021-01-13 DIAGNOSIS — R630 Anorexia: Secondary | ICD-10-CM | POA: Diagnosis present

## 2021-01-13 DIAGNOSIS — D860 Sarcoidosis of lung: Secondary | ICD-10-CM | POA: Diagnosis not present

## 2021-01-13 DIAGNOSIS — Z833 Family history of diabetes mellitus: Secondary | ICD-10-CM

## 2021-01-13 DIAGNOSIS — E872 Acidosis: Secondary | ICD-10-CM | POA: Diagnosis not present

## 2021-01-13 DIAGNOSIS — Z66 Do not resuscitate: Secondary | ICD-10-CM | POA: Diagnosis present

## 2021-01-13 DIAGNOSIS — I6782 Cerebral ischemia: Secondary | ICD-10-CM | POA: Diagnosis not present

## 2021-01-13 DIAGNOSIS — Z043 Encounter for examination and observation following other accident: Secondary | ICD-10-CM | POA: Diagnosis not present

## 2021-01-13 DIAGNOSIS — Z6831 Body mass index (BMI) 31.0-31.9, adult: Secondary | ICD-10-CM | POA: Diagnosis not present

## 2021-01-13 DIAGNOSIS — Y92009 Unspecified place in unspecified non-institutional (private) residence as the place of occurrence of the external cause: Secondary | ICD-10-CM | POA: Diagnosis not present

## 2021-01-13 DIAGNOSIS — I499 Cardiac arrhythmia, unspecified: Secondary | ICD-10-CM | POA: Diagnosis not present

## 2021-01-13 DIAGNOSIS — I5032 Chronic diastolic (congestive) heart failure: Secondary | ICD-10-CM

## 2021-01-13 DIAGNOSIS — Z96652 Presence of left artificial knee joint: Secondary | ICD-10-CM | POA: Diagnosis present

## 2021-01-13 DIAGNOSIS — R61 Generalized hyperhidrosis: Secondary | ICD-10-CM | POA: Diagnosis not present

## 2021-01-13 DIAGNOSIS — K5900 Constipation, unspecified: Secondary | ICD-10-CM | POA: Diagnosis not present

## 2021-01-13 DIAGNOSIS — I700 Atherosclerosis of aorta: Secondary | ICD-10-CM | POA: Diagnosis not present

## 2021-01-13 DIAGNOSIS — I959 Hypotension, unspecified: Secondary | ICD-10-CM | POA: Diagnosis present

## 2021-01-13 DIAGNOSIS — E871 Hypo-osmolality and hyponatremia: Secondary | ICD-10-CM | POA: Diagnosis present

## 2021-01-13 DIAGNOSIS — Z20822 Contact with and (suspected) exposure to covid-19: Secondary | ICD-10-CM | POA: Diagnosis not present

## 2021-01-13 DIAGNOSIS — Z91040 Latex allergy status: Secondary | ICD-10-CM

## 2021-01-13 DIAGNOSIS — J80 Acute respiratory distress syndrome: Secondary | ICD-10-CM | POA: Diagnosis not present

## 2021-01-13 DIAGNOSIS — G629 Polyneuropathy, unspecified: Secondary | ICD-10-CM | POA: Diagnosis present

## 2021-01-13 DIAGNOSIS — E860 Dehydration: Secondary | ICD-10-CM | POA: Diagnosis present

## 2021-01-13 DIAGNOSIS — Z888 Allergy status to other drugs, medicaments and biological substances status: Secondary | ICD-10-CM

## 2021-01-13 DIAGNOSIS — E861 Hypovolemia: Secondary | ICD-10-CM | POA: Diagnosis present

## 2021-01-13 DIAGNOSIS — Z79899 Other long term (current) drug therapy: Secondary | ICD-10-CM

## 2021-01-13 HISTORY — DX: Chronic diastolic (congestive) heart failure: I50.32

## 2021-01-13 LAB — CBC
HCT: 28.5 % — ABNORMAL LOW (ref 36.0–46.0)
Hemoglobin: 10.1 g/dL — ABNORMAL LOW (ref 12.0–15.0)
MCH: 32.5 pg (ref 26.0–34.0)
MCHC: 35.4 g/dL (ref 30.0–36.0)
MCV: 91.6 fL (ref 80.0–100.0)
Platelets: 215 10*3/uL (ref 150–400)
RBC: 3.11 MIL/uL — ABNORMAL LOW (ref 3.87–5.11)
RDW: 11.9 % (ref 11.5–15.5)
WBC: 2.2 10*3/uL — ABNORMAL LOW (ref 4.0–10.5)
nRBC: 0 % (ref 0.0–0.2)

## 2021-01-13 LAB — C-REACTIVE PROTEIN: CRP: 0.8 mg/dL (ref <1.0)

## 2021-01-13 LAB — BASIC METABOLIC PANEL
Anion gap: 18 — ABNORMAL HIGH (ref 5–15)
Anion gap: 17 — ABNORMAL HIGH (ref 5–15)
BUN: 6 mg/dL — ABNORMAL LOW (ref 8–23)
BUN: 8 mg/dL (ref 8–23)
CO2: 17 mmol/L — ABNORMAL LOW (ref 22–32)
CO2: 21 mmol/L — ABNORMAL LOW (ref 22–32)
Calcium: 5.9 mg/dL — CL (ref 8.9–10.3)
Calcium: 8.6 mg/dL — ABNORMAL LOW (ref 8.9–10.3)
Chloride: 109 mmol/L (ref 98–111)
Chloride: 100 mmol/L (ref 98–111)
Creatinine, Ser: 0.3 mg/dL — ABNORMAL LOW (ref 0.44–1.00)
Creatinine, Ser: 0.55 mg/dL (ref 0.44–1.00)
GFR, Estimated: >60 mL/min (ref >60)
GFR, Estimated: >60 mL/min (ref >60)
Glucose, Bld: 64 mg/dL — ABNORMAL LOW (ref 70–99)
Glucose, Bld: 78 mg/dL (ref 70–99)
Potassium: 2.7 mmol/L — CL (ref 3.5–5.1)
Potassium: 3.6 mmol/L (ref 3.5–5.1)
Sodium: 144 mmol/L (ref 135–145)
Sodium: 138 mmol/L (ref 135–145)

## 2021-01-13 LAB — I-STAT CHEM 8, ED
BUN: 8 mg/dL (ref 8–23)
Calcium, Ion: 1.01 mmol/L — ABNORMAL LOW (ref 1.15–1.40)
Chloride: 91 mmol/L — ABNORMAL LOW (ref 98–111)
Creatinine, Ser: 1.2 mg/dL — ABNORMAL HIGH (ref 0.44–1.00)
Glucose, Bld: 93 mg/dL (ref 70–99)
HCT: 44 % (ref 36.0–46.0)
Hemoglobin: 15 g/dL (ref 12.0–15.0)
Potassium: 3.7 mmol/L (ref 3.5–5.1)
Sodium: 127 mmol/L — ABNORMAL LOW (ref 135–145)
TCO2: 23 mmol/L (ref 22–32)

## 2021-01-13 LAB — TROPONIN I (HIGH SENSITIVITY)
Troponin I (High Sensitivity): 3 ng/L (ref <18)
Troponin I (High Sensitivity): 10 ng/L (ref <18)

## 2021-01-13 LAB — CBC WITH DIFFERENTIAL/PLATELET
Abs Immature Granulocytes: 0.01 10*3/uL (ref 0.00–0.07)
Basophils Absolute: 0 10*3/uL (ref 0.0–0.1)
Basophils Relative: 1 %
Eosinophils Absolute: 0.1 10*3/uL (ref 0.0–0.5)
Eosinophils Relative: 2 %
HCT: 40.3 % (ref 36.0–46.0)
Hemoglobin: 14.4 g/dL (ref 12.0–15.0)
Immature Granulocytes: 0 %
Lymphocytes Relative: 51 %
Lymphs Abs: 2.1 10*3/uL (ref 0.7–4.0)
MCH: 32.1 pg (ref 26.0–34.0)
MCHC: 35.7 g/dL (ref 30.0–36.0)
MCV: 89.8 fL (ref 80.0–100.0)
Monocytes Absolute: 0.4 10*3/uL (ref 0.1–1.0)
Monocytes Relative: 11 %
Neutro Abs: 1.4 10*3/uL — ABNORMAL LOW (ref 1.7–7.7)
Neutrophils Relative %: 35 %
Platelets: 359 10*3/uL (ref 150–400)
RBC: 4.49 MIL/uL (ref 3.87–5.11)
RDW: 11.8 % (ref 11.5–15.5)
WBC: 4 10*3/uL (ref 4.0–10.5)
nRBC: 0 % (ref 0.0–0.2)

## 2021-01-13 LAB — URINALYSIS, ROUTINE W REFLEX MICROSCOPIC
Bilirubin Urine: NEGATIVE
Glucose, UA: NEGATIVE mg/dL
Hgb urine dipstick: NEGATIVE
Ketones, ur: NEGATIVE mg/dL
Leukocytes,Ua: NEGATIVE
Nitrite: NEGATIVE
Protein, ur: NEGATIVE mg/dL
Specific Gravity, Urine: 1.009 (ref 1.005–1.030)
pH: 5 (ref 5.0–8.0)

## 2021-01-13 LAB — MAGNESIUM: Magnesium: 1.6 mg/dL — ABNORMAL LOW (ref 1.7–2.4)

## 2021-01-13 LAB — RESP PANEL BY RT-PCR (FLU A&B, COVID) ARPGX2
Influenza A by PCR: NEGATIVE
Influenza B by PCR: NEGATIVE
SARS Coronavirus 2 by RT PCR: NEGATIVE

## 2021-01-13 LAB — STREP PNEUMONIAE URINARY ANTIGEN: Strep Pneumo Urinary Antigen: NEGATIVE

## 2021-01-13 LAB — LACTIC ACID, PLASMA: Lactic Acid, Venous: 3.5 mmol/L (ref 0.5–1.9)

## 2021-01-13 LAB — PHOSPHORUS: Phosphorus: 2.6 mg/dL (ref 2.5–4.6)

## 2021-01-13 LAB — HIV ANTIBODY (ROUTINE TESTING W REFLEX): HIV Screen 4th Generation wRfx: NONREACTIVE

## 2021-01-13 LAB — OSMOLALITY: Osmolality: 350 mOsm/kg (ref 275–295)

## 2021-01-13 LAB — OSMOLALITY, URINE: Osmolality, Ur: 159 mOsm/kg — ABNORMAL LOW (ref 300–900)

## 2021-01-13 LAB — SODIUM, URINE, RANDOM: Sodium, Ur: 16 mmol/L

## 2021-01-13 MED ORDER — IRBESARTAN 300 MG PO TABS
300.0000 mg | ORAL_TABLET | Freq: Every day | ORAL | Status: DC
  Administered 2021-01-13 – 2021-01-14 (×2): 300 mg via ORAL
  Filled 2021-01-13 (×2): qty 1

## 2021-01-13 MED ORDER — SODIUM CHLORIDE 0.9 % IV SOLN: INTRAVENOUS | Status: DC

## 2021-01-13 MED ORDER — POLYETHYLENE GLYCOL 3350 17 G PO PACK
17.0000 g | PACK | Freq: Every day | ORAL | Status: DC | PRN
  Administered 2021-01-14 – 2021-01-15 (×2): 17 g via ORAL
  Filled 2021-01-13 (×2): qty 1

## 2021-01-13 MED ORDER — SODIUM CHLORIDE 0.9 % IV SOLN
2.0000 g | INTRAVENOUS | Status: AC
  Administered 2021-01-13 – 2021-01-17 (×5): 2 g via INTRAVENOUS
  Filled 2021-01-13: qty 20
  Filled 2021-01-13 (×5): qty 2

## 2021-01-13 MED ORDER — VANCOMYCIN HCL IN DEXTROSE 1-5 GM/200ML-% IV SOLN
1000.0000 mg | Freq: Once | INTRAVENOUS | Status: DC
  Administered 2021-01-13: 1000 mg via INTRAVENOUS
  Filled 2021-01-13: qty 200

## 2021-01-13 MED ORDER — ENOXAPARIN SODIUM 40 MG/0.4ML ~~LOC~~ SOLN
40.0000 mg | SUBCUTANEOUS | Status: DC
  Administered 2021-01-13 – 2021-01-18 (×6): 40 mg via SUBCUTANEOUS
  Filled 2021-01-13 (×6): qty 0.4

## 2021-01-13 MED ORDER — CYCLOSPORINE 0.05 % OP EMUL
1.0000 [drp] | Freq: Two times a day (BID) | OPHTHALMIC | Status: DC
  Administered 2021-01-13 – 2021-01-18 (×11): 1 [drp] via OPHTHALMIC
  Filled 2021-01-13 (×11): qty 1

## 2021-01-13 MED ORDER — SODIUM CHLORIDE 0.9 % IV SOLN: Freq: Once | INTRAVENOUS | Status: AC

## 2021-01-13 MED ORDER — AMLODIPINE BESYLATE 10 MG PO TABS
10.0000 mg | ORAL_TABLET | Freq: Every day | ORAL | Status: DC
  Administered 2021-01-13 – 2021-01-14 (×2): 10 mg via ORAL
  Filled 2021-01-13: qty 2
  Filled 2021-01-13: qty 1

## 2021-01-13 MED ORDER — IOHEXOL 350 MG/ML SOLN
100.0000 mL | Freq: Once | INTRAVENOUS | Status: AC | PRN
  Administered 2021-01-13: 100 mL via INTRAVENOUS

## 2021-01-13 MED ORDER — ONDANSETRON HCL 4 MG/2ML IJ SOLN: 4.0000 mg | Freq: Four times a day (QID) | INTRAMUSCULAR | Status: DC | PRN

## 2021-01-13 MED ORDER — ACETAMINOPHEN 325 MG PO TABS
650.0000 mg | ORAL_TABLET | Freq: Four times a day (QID) | ORAL | Status: DC | PRN
  Administered 2021-01-13 – 2021-01-17 (×8): 650 mg via ORAL
  Filled 2021-01-13 (×8): qty 2

## 2021-01-13 MED ORDER — SODIUM CHLORIDE 0.9 % IV SOLN
2.0000 g | INTRAVENOUS | Status: AC
  Administered 2021-01-13: 2 g via INTRAVENOUS
  Filled 2021-01-13: qty 2

## 2021-01-13 MED ORDER — ALBUTEROL SULFATE HFA 108 (90 BASE) MCG/ACT IN AERS
2.0000 | INHALATION_SPRAY | RESPIRATORY_TRACT | Status: DC | PRN
  Filled 2021-01-13: qty 6.7

## 2021-01-13 MED ORDER — SODIUM CHLORIDE 0.9 % IV SOLN
500.0000 mg | INTRAVENOUS | Status: DC
  Administered 2021-01-13 – 2021-01-14 (×2): 500 mg via INTRAVENOUS
  Filled 2021-01-13 (×2): qty 500

## 2021-01-13 MED ORDER — SODIUM CHLORIDE 0.9 % IV SOLN: INTRAVENOUS | Status: AC

## 2021-01-13 MED ORDER — ONDANSETRON HCL 4 MG PO TABS: 4.0000 mg | ORAL_TABLET | Freq: Four times a day (QID) | ORAL | Status: DC | PRN

## 2021-01-13 MED ORDER — MELATONIN 5 MG PO TABS
5.0000 mg | ORAL_TABLET | Freq: Every day | ORAL | Status: DC
  Administered 2021-01-13 – 2021-01-17 (×5): 5 mg via ORAL
  Filled 2021-01-13 (×5): qty 1

## 2021-01-13 MED ORDER — ACETAMINOPHEN 650 MG RE SUPP: 650.0000 mg | Freq: Four times a day (QID) | RECTAL | Status: DC | PRN

## 2021-01-13 MED ORDER — SODIUM CHLORIDE 0.9% FLUSH
3.0000 mL | Freq: Two times a day (BID) | INTRAVENOUS | Status: DC
  Administered 2021-01-13 – 2021-01-18 (×4): 3 mL via INTRAVENOUS

## 2021-01-13 MED ORDER — SODIUM CHLORIDE 0.9 % IV BOLUS
500.0000 mL | Freq: Once | INTRAVENOUS | Status: AC
  Administered 2021-01-13: 500 mL via INTRAVENOUS

## 2021-01-13 NOTE — ED Notes (Signed)
Date and time results received: 01/13/21 0944   Test: Serum Osmo Critical Value: 350  Name of Provider Notified: Madaline Brilliant  Orders Received? Or Actions Taken?: MD paged with critical

## 2021-01-13 NOTE — Evaluation (Signed)
Physical Therapy Evaluation Patient Details Name: Kathy [name] MRN: [id number] DOB: 1951/10/03 Today's Date: 01/13/2021   History of Present Illness  70 year old female with past medical history of hypertension, osteoarthritis, fibromyalgia, diastolic congestive heart failure , pulmonary sarcoidosis,staatus post first MTP arthrodesis 1/19 presenting to [facility] via EMS 2* status post an episode of loss of consciousness, found hypo natremia.  Clinical Impression  Patient mobilizing at close to baseline. Will see again using knee scooter next visit. Patient using CAM boot on right.  Pt admitted with above diagnosis.  Pt currently with functional limitations due to the deficits listed below (see PT Problem List). Pt will benefit from skilled PT to increase their independence and safety with mobility to allow discharge to the venue listed below.    Patient tolerated well, reported mild dizziness. Noted recent orthostatic BP taken by nursing.      Follow Up Recommendations No PT follow up    Equipment Recommendations  None recommended by PT    Recommendations for Other Services       Precautions / Restrictions Precautions Precautions: Fall Precaution Comments: CAM boot right, using Knee scooter at home Restrictions Weight Bearing Restrictions: Yes RLE Weight Bearing: Non weight bearing Other Position/Activity Restrictions: per Podiatry note 1/24 "NWB in CAM boot but also discussed that she can put her weight down for balance and transfers if needed"      Mobility  Bed Mobility Overal bed mobility: Modified Independent                  Transfers                    Ambulation/Gait Ambulation/Gait assistance: Supervision Gait Distance (Feet): 6 Feet Assistive device: Rolling walker (2 wheeled) Gait Pattern/deviations: Step-to pattern;Decreased stance time - right Gait velocity: decr   General Gait Details: placed weight on right forearm due to IV site painful on  thumb, palced weight on heel in CAM boot. limited due to using knee scooter PTA and  to limit WB on right foot.  Stairs            Wheelchair Mobility    Modified Rankin (Stroke Patients Only)       Balance Overall balance assessment: History of Falls;Needs assistance Sitting-balance support: No upper extremity supported Sitting balance-Leahy Scale: Normal     Standing balance support: During functional activity;Bilateral upper extremity supported                                 Pertinent Vitals/Pain Pain Assessment: Faces Faces Pain Scale: Hurts little more Pain Location: right wrist at IV site_RN in to inspect/flush Pain Intervention(s): Monitored during session    Home Living Family/patient expects to be discharged to:: Private residence Living Arrangements: Children Available Help at Discharge: Family Type of Home: House Home Access: Stairs to enter Entrance Stairs-Rails: Right Entrance Stairs-Number of Steps: 2 Home Layout: One level Home Equipment: Environmental consultant - 2 wheels;Bedside commode;Other (comment) Additional Comments: knee scooter    Prior Function Level of Independence: Independent with assistive device(s)         Comments: with knee scooter     Hand Dominance   Dominant Hand: Right    Extremity/Trunk Assessment   Upper Extremity Assessment Upper Extremity Assessment: Overall WFL for tasks assessed;RUE deficits/detail RUE Deficits / Details: pain            Communication   Communication: No difficulties  Cognition Arousal/Alertness: Awake/alert Behavior During Therapy: WFL for tasks assessed/performed Overall Cognitive Status: Within Functional Limits for tasks assessed                                        General Comments      Exercises     Assessment/Plan    PT Assessment Patient needs continued PT services  PT Problem List Decreased activity tolerance;Decreased mobility       PT Treatment  Interventions DME instruction;Therapeutic activities;Gait training;Functional mobility training;Patient/family education    PT Goals (Current goals can be found in the Care Plan section)  Acute Rehab PT Goals Patient Stated Goal: to go home PT Goal Formulation: With patient Time For Goal Achievement: 01/27/21 Potential to Achieve Goals: Good    Frequency Min 3X/week   Barriers to discharge        Co-evaluation               AM-PAC PT "6 Clicks" Mobility  Outcome Measure Help needed turning from your back to your side while in a flat bed without using bedrails?: None Help needed moving from lying on your back to sitting on the side of a flat bed without using bedrails?: None Help needed moving to and from a bed to a chair (including a wheelchair)?: A Little Help needed standing up from a chair using your arms (e.g., wheelchair or bedside chair)?: A Little Help needed to walk in hospital room?: A Little Help needed climbing 3-5 steps with a railing? : A Little 6 Click Score: 20    End of Session Equipment Utilized During Treatment:  (CAM boot) Activity Tolerance: Patient tolerated treatment well Patient left: in bed;with call bell/phone within reach;with family/visitor present;with nursing/sitter in room Nurse Communication: Mobility status PT Visit Diagnosis: Unsteadiness on feet (R26.81);Difficulty in walking, not elsewhere classified (R26.2)    Time: 2334-3568 PT Time Calculation (min) (ACUTE ONLY): 18 min   Charges:   PT Evaluation $PT Eval Low Complexity: [facility] Acute Rehabilitation Services Pager [phone] Office [phone]   Claretha Cooper 01/13/2021, 9:11 AM

## 2021-01-13 NOTE — ED Notes (Signed)
Patient transported to CT 

## 2021-01-13 NOTE — Progress Notes (Signed)
EEG Completed; Results Pending  

## 2021-01-13 NOTE — Procedures (Signed)
Patient Name: Kathy [name]  MRN: [id number]  Epilepsy Attending: Lora Havens  Referring Physician/Provider: Dr. Inda Merlin Date:  01/13/2021 Duration: 23.12 mins  Patient history: 70 year old female with transient loss of consciousness.  EEG to evaluate for seizures.  Level of alertness: Awake, asleep  AEDs during EEG study: None  Technical aspects: This EEG study was done with scalp electrodes positioned according to the 10-20 International system of electrode placement. Electrical activity was acquired at a sampling rate of 500Hz  and reviewed with a high frequency filter of 70Hz  and a low frequency filter of 1Hz . EEG data were recorded continuously and digitally stored.   Description: The posterior dominant rhythm consists of 8-9 Hz activity of moderate voltage (25-35 uV) seen predominantly in posterior head regions, symmetric and reactive to eye opening and eye closing. Sleep was characterized by vertex waves, sleep spindles (12 to 14 Hz), maximal frontocentral region. Physiologic photic driving was seen during photic stimulation.  Hyperventilation was not performed.     IMPRESSION: This study is within normal limits. No seizures or epileptiform discharges were seen throughout the recording.  [name] [name]

## 2021-01-13 NOTE — H&P (Signed)
History and Physical    Kathy [name] NIO:[id number] DOB: November 17, 1951 DOA: 01/13/2021  PCP: Denita Lung, MD  Patient coming from: home via EMS   Chief Complaint:  Chief Complaint  Patient presents with  . Loss of Consciousness  . Fall     HPI:    70 year old female with past medical history of hypertension, osteoarthritis, fibromyalgia, diastolic congestive heart failure (Echo 10/2020 EF 55-60%) , pulmonary sarcoidosis, hallux rigidus of the first toe of the right foot status post first MTP arthrodesis 1/19 presenting to [facility] emergency department via EMS status post an episode of loss of consciousness.  Patient explains that earlier this morning she was sleeping on her recliner.  She got up from the recliner to walk to the bathroom and suddenly lost consciousness.  Patient denies any lightheadedness, palpitations or other symptoms prior to the episode of loss of consciousness.  Patient denies any tongue biting self urination or self defecation.  After waking, her son came to her side and immediately called EMS.  Upon further questioning, patient states that in the past 2 weeks she has been experiencing poor appetite, generalized malaise and a dry cough.  Patient denies fevers, sick contacts, recent travel or confirmed contacts with COVID-19 infection.  When EMS arrived to the home, they promptly brought the patient into [facility] emergency department for evaluation.  Upon evaluation in the emergency department patient was found to have a low sodium of 127.  CT head was found to be unremarkable.  CT cervical spine was found to be unremarkable.  Urinalysis was found to be unremarkable.  CT angiogram of the chest was negative for pulmonary embolism but did reveal an area of the right upper lobe with scarring and superimposed infectious/inflammatory process concerning for pneumonia.  Patient was initiated on intravenous cefepime and vancomycin.  The hospitalist  group was then called to assess the patient for mission the hospital.  P   Review of Systems:   Review of Systems  Constitutional: Positive for malaise/fatigue.  Respiratory: Positive for cough.     Past Medical History:  Diagnosis Date  . Chronic diastolic CHF (congestive heart failure) ([place]) 01/13/2021  . Exotropia of left eye   . Fibromyalgia   . History of uterine leiomyoma   . Hypertension   . Macular degeneration   . OA (osteoarthritis)   . Overactive bladder   . Sarcoidosis   . Wears glasses     Past Surgical History:  Procedure Laterality Date  . ABDOMINAL HYSTERECTOMY  2013  . ADJUSTABLE SUTURE MANIPULATION Left 04/10/2015   Procedure: ADJUSTABLE SUTURE MANIPULATION;  Surgeon: Gevena Cotton, MD;  Location: [facility];  Service: Ophthalmology;  Laterality: Left;  . EYE SURGERY    . MEDIAN RECTUS REPAIR Left 04/10/2015   Procedure: MEDIAN RECTUS RESECTION LATERAL RECTUS RECESSION,AJUSTABLES SUTURES LEFT EYE;  Surgeon: Gevena Cotton, MD;  Location: [facility];  Service: Ophthalmology;  Laterality: Left;  . TONSILLECTOMY  as child  . TOTAL ABDOMINAL HYSTERECTOMY W/ BILATERAL SALPINGOOPHORECTOMY  10-14-2010   and TVT midurethral sling and culdoplasty  . TOTAL KNEE ARTHROPLASTY Left 06/15/2019   Procedure: TOTAL KNEE ARTHROPLASTY;  Surgeon: Paralee Cancel, MD;  Location: [facility];  Service: Orthopedics;  Laterality: Left;  70 mins     reports that she has never smoked. She has never used smokeless tobacco. She reports current alcohol use of about 1.0 standard drink of alcohol per week. She reports that she does not use  drugs.  Allergies  Allergen Reactions  . Lortab [Hydrocodone-Acetaminophen] Nausea And Vomiting  . Latex Other (See Comments)    Pt unsure of reaction!    Family History  Problem Relation Age of Onset  . Colon cancer Mother   . Diabetes Father   . Diabetes Sister   . Hypertension Sister   . Diabetes Sister   .  Hypertension Sister   . Hypertension Sister      Prior to Admission medications   Medication Sig Start Date End Date Taking? Authorizing Provider  acetaminophen (TYLENOL) 500 MG tablet Take 2 tablets (1,000 mg total) by mouth every 8 (eight) hours as needed for moderate pain. 11/26/20   Hosie Poisson, MD  cycloSPORINE (RESTASIS) 0.05 % ophthalmic emulsion Place 1 drop into both eyes 2 (two) times daily.    [provider]  diclofenac Sodium (VOLTAREN) 1 % GEL APPLY 2 GRAMS TO AFFECTED AREA 4 TIMES A DAY 06/25/20   Trula Slade, DPM  ibuprofen (ADVIL) 600 MG tablet Take 1 tablet (600 mg total) by mouth every 8 (eight) hours as needed. 12/25/20   Trula Slade, DPM  loperamide (IMODIUM) 2 MG capsule Take 1 capsule (2 mg total) by mouth as needed for diarrhea or loose stools. 11/26/20   Hosie Poisson, MD  melatonin 5 MG TABS Take 1 tablet (5 mg total) by mouth at bedtime. 11/26/20   Hosie Poisson, MD  Multiple Vitamins-Minerals (PRESERVISION AREDS 2) CAPS Take 1 capsule by mouth 2 (two) times a day.    [provider]  Olmesartan-amLODIPine-HCTZ 40-10-12.5 MG TABS Take 1 tablet by mouth daily. 09/02/20   Denita Lung, MD  ondansetron (ZOFRAN) 4 MG tablet Take 1 tablet (4 mg total) by mouth every 8 (eight) hours as needed for nausea or vomiting. 12/25/20   Trula Slade, DPM    Physical Exam: Vitals:   01/13/21 0530 01/13/21 0600 01/13/21 0630 01/13/21 0645  BP: 122/73 122/74 114/73 125/70  Pulse: (!) 103 (!) 103 (!) 104 (!) 106  Resp: 18 12 14 16   Temp:      SpO2: 96% 98% 97% 100%  Weight:      Height:        Constitutional: Acute alert and oriented x3, no associated distress.   Skin: no rashes, no lesions, good skin turgor noted. Eyes: Pupils are equally reactive to light.  No evidence of scleral icterus or conjunctival pallor.  ENMT: Moist mucous membranes noted.  Posterior pharynx clear of any exudate or lesions.   Neck: normal, supple, no masses, no  thyromegaly.  No evidence of jugular venous distension.   Respiratory: scattered rhonchi bilaterally no wheezing, no crackles. Normal respiratory effort. No accessory muscle use.  Cardiovascular: Regular rate and rhythm, no murmurs / rubs / gallops. No extremity edema. 2+ pedal pulses. No carotid bruits.  Chest:   Nontender without crepitus or deformity.   Back:   Nontender without crepitus or deformity. Abdomen: Abdomen is soft and nontender.  No evidence of intra-abdominal masses.  Positive bowel sounds noted in all quadrants.   Musculoskeletal: Surgical boot noted over the right foot.  No joint deformity upper and lower extremities. Good ROM, no contractures. Normal muscle tone.  Neurologic: CN 2-12 grossly intact. Sensation intact.  Patient moving all 4 extremities spontaneously.  Patient is following all commands.  Patient is responsive to verbal stimuli.   Psychiatric: Patient exhibits depressed mood with flat affect.  Patient denies suicidal ideation.  Patient seems to possess insight  as to their current situation.     Labs on Admission: I have personally reviewed following labs and imaging studies -   CBC: Recent Labs  Lab 01/13/21 0324 01/13/21 0334  WBC 4.0  --   NEUTROABS 1.4*  --   HGB 14.4 15.0  HCT 40.3 44.0  MCV 89.8  --   PLT 359  --    Basic Metabolic Panel: Recent Labs  Lab 01/13/21 0334  NA 127*  K 3.7  CL 91*  GLUCOSE 93  BUN 8  CREATININE 1.20*   GFR: Estimated Creatinine Clearance: 43.5 mL/min (A) (by C-G formula based on SCr of 1.2 mg/dL (H)). Liver Function Tests: No results for input(s): AST, ALT, ALKPHOS, BILITOT, PROT, ALBUMIN in the last 168 hours. No results for input(s): LIPASE, AMYLASE in the last 168 hours. No results for input(s): AMMONIA in the last 168 hours. Coagulation Profile: No results for input(s): INR, PROTIME in the last 168 hours. Cardiac Enzymes: No results for input(s): CKTOTAL, CKMB, CKMBINDEX, TROPONINI in the last 168  hours. BNP (last 3 results) No results for input(s): PROBNP in the last 8760 hours. HbA1C: No results for input(s): HGBA1C in the last 72 hours. CBG: No results for input(s): GLUCAP in the last 168 hours. Lipid Profile: No results for input(s): CHOL, HDL, LDLCALC, TRIG, CHOLHDL, LDLDIRECT in the last 72 hours. Thyroid Function Tests: No results for input(s): TSH, T4TOTAL, FREET4, T3FREE, THYROIDAB in the last 72 hours. Anemia Panel: No results for input(s): VITAMINB12, FOLATE, FERRITIN, TIBC, IRON, RETICCTPCT in the last 72 hours. Urine analysis:    Component Value Date/Time   COLORURINE COLORLESS (A) 01/13/2021 0439   APPEARANCEUR CLEAR 01/13/2021 0439   LABSPEC 1.009 01/13/2021 0439   LABSPEC 1.020 11/11/2020 1657   PHURINE 5.0 01/13/2021 0439   GLUCOSEU NEGATIVE 01/13/2021 0439   HGBUR NEGATIVE 01/13/2021 0439   BILIRUBINUR NEGATIVE 01/13/2021 0439   BILIRUBINUR negative 11/11/2020 1657   BILIRUBINUR n 04/13/2017 0932   KETONESUR NEGATIVE 01/13/2021 0439   PROTEINUR NEGATIVE 01/13/2021 0439   UROBILINOGEN negative (A) 04/13/2017 0932   UROBILINOGEN 1.0 05/01/2012 1123   NITRITE NEGATIVE 01/13/2021 0439   LEUKOCYTESUR NEGATIVE 01/13/2021 0439    Radiological Exams on Admission - Personally Reviewed: CT Head Wo Contrast  Result Date: 01/13/2021 CLINICAL DATA:  Facial trauma status post fall with alcohol use EXAM: CT HEAD WITHOUT CONTRAST CT CERVICAL SPINE WITHOUT CONTRAST TECHNIQUE: Multidetector CT imaging of the head and cervical spine was performed following the standard protocol without intravenous contrast. Multiplanar CT image reconstructions of the cervical spine were also generated. COMPARISON:  CT head and cervical spine 11/21/2020 FINDINGS: CT HEAD FINDINGS Brain: Patchy and confluent areas of decreased attenuation are noted throughout the deep and periventricular white matter of the cerebral hemispheres bilaterally, compatible with chronic microvascular ischemic  disease. No evidence of large-territorial acute infarction. No parenchymal hemorrhage. No mass lesion. No extra-axial collection. No mass effect or midline shift. No hydrocephalus. Basilar cisterns are patent. Vascular: No hyperdense vessel. Skull: No acute fracture or focal lesion. Sinuses/Orbits: Paranasal sinuses and mastoid air cells are clear. Bilateral lens replacement. Otherwise the orbits are unremarkable. Other: None. CT CERVICAL SPINE FINDINGS Alignment: Normal. Skull base and vertebrae: Similar-appearing multilevel degenerative changes of the spine that is most prominent at the C4 through C6 levels. No acute fracture. No aggressive appearing focal osseous lesion or focal pathologic process. Soft tissues and spinal canal: No prevertebral fluid or swelling. No visible canal hematoma. Upper chest: Unremarkable. Other: Mild  carotid artery calcifications within the neck. Redemonstration of a subcentimeter hypodensity within the right thyroid gland. IMPRESSION: 1. No acute intracranial abnormality. 2. No acute displaced fracture or traumatic listhesis of the cervical spine. Electronically Signed   By: Iven Finn M.D.   On: 01/13/2021 04:23   CT Angio Chest PE W and/or Wo Contrast  Result Date: 01/13/2021 CLINICAL DATA:  Syncope, simple, normal neuro exam postoptachy withpassingout fall EXAM: CT ANGIOGRAPHY CHEST WITH CONTRAST TECHNIQUE: Multidetector CT imaging of the chest was performed using the standard protocol during bolus administration of intravenous contrast. Multiplanar CT image reconstructions and MIPs were obtained to evaluate the vascular anatomy. CONTRAST:  136mL OMNIPAQUE IOHEXOL 350 MG/ML SOLN COMPARISON:  Chest x-ray 04/24/2016 FINDINGS: Cardiovascular: Satisfactory opacification of the pulmonary arteries to the segmental level. No evidence of pulmonary embolism. Normal heart size. No significant pericardial effusion. The thoracic aorta is normal in caliber. Mild atherosclerotic plaque  of the thoracic aorta. Mild coronary artery calcifications. Mediastinum/Nodes: Calcified mediastinal and bilateral hilar lymph nodes. No enlarged mediastinal, hilar, or axillary lymph nodes. Thyroid gland, trachea, and esophagus demonstrate no significant findings. Lungs/Pleura: Right upper lobe bronchiectasis and peribronchial consolidation. Nodular-like consolidations within the right upper lobe. No focal consolidation or marked scarring of the remainder of the lungs. No pulmonary nodule or mass. No pleural effusion. No pneumothorax. Upper Abdomen: No acute abnormality. Musculoskeletal: No chest wall abnormality. No suspicious lytic or blastic osseous lesions. No acute displaced fracture. Multilevel degenerative changes of the spine. Review of the MIP images confirms the above findings. IMPRESSION: 1. Right upper lobe scarring with suggestion of superimposed infection/inflammation. Followup PA and lateral chest X-ray is recommended in 3-4 weeks following therapy to ensure resolution and exclude underlying malignancy. 2. No central or segmental pulmonary embolus. Limited evaluation more distally due to artifact. 3. Mediastinal and bilateral hilar calcified lymph nodes consistent with sarcoidosis. Electronically Signed   By: Iven Finn M.D.   On: 01/13/2021 04:35   CT Cervical Spine Wo Contrast  Result Date: 01/13/2021 CLINICAL DATA:  Facial trauma status post fall with alcohol use EXAM: CT HEAD WITHOUT CONTRAST CT CERVICAL SPINE WITHOUT CONTRAST TECHNIQUE: Multidetector CT imaging of the head and cervical spine was performed following the standard protocol without intravenous contrast. Multiplanar CT image reconstructions of the cervical spine were also generated. COMPARISON:  CT head and cervical spine 11/21/2020 FINDINGS: CT HEAD FINDINGS Brain: Patchy and confluent areas of decreased attenuation are noted throughout the deep and periventricular white matter of the cerebral hemispheres bilaterally,  compatible with chronic microvascular ischemic disease. No evidence of large-territorial acute infarction. No parenchymal hemorrhage. No mass lesion. No extra-axial collection. No mass effect or midline shift. No hydrocephalus. Basilar cisterns are patent. Vascular: No hyperdense vessel. Skull: No acute fracture or focal lesion. Sinuses/Orbits: Paranasal sinuses and mastoid air cells are clear. Bilateral lens replacement. Otherwise the orbits are unremarkable. Other: None. CT CERVICAL SPINE FINDINGS Alignment: Normal. Skull base and vertebrae: Similar-appearing multilevel degenerative changes of the spine that is most prominent at the C4 through C6 levels. No acute fracture. No aggressive appearing focal osseous lesion or focal pathologic process. Soft tissues and spinal canal: No prevertebral fluid or swelling. No visible canal hematoma. Upper chest: Unremarkable. Other: Mild carotid artery calcifications within the neck. Redemonstration of a subcentimeter hypodensity within the right thyroid gland. IMPRESSION: 1. No acute intracranial abnormality. 2. No acute displaced fracture or traumatic listhesis of the cervical spine. Electronically Signed   By: Iven Finn M.D.   On:  01/13/2021 04:23    Bedside telemetry: Personally reviewed.  Rhythm is sinus tachycardia with heart rate of 115 bpm.    Assessment/Plan Principal Problem:   Loss of consciousness ([place])   Patient has experienced yet another episode of loss of consciousness.  Patient was last hospitalized for this 12/23 until 12/28 at [facility] and at the time was felt to have suffered orthostatic syncope.  Orthostatic vital signs negative in the emergency department  No evidence of arrhythmias in the emergency department  No evidence of pulmonary embolism on CT angiogram of the chest.  While patient may have a right upper lobe pneumonia I do not believe this is what precipitated the patient's episode.  I am still not certain  that this is syncope versus seizure.  Echocardiogram in 10/2020 was unremarkable for the most part.  We will additionally obtain an EEG during this hospitalization to ensure patient does not have any evidence of seizure activity.  Monitoring patient on telemetry  Patient is notably on hydrochlorothiazide.  This will be discontinued.  Active Problems:   Hyponatremia   Modest hyponatremia of 127 with mild lethargy  Multiple possible causes:  Patient continues to be on hydrochlorothiazide which will be discontinued.  SIADH is a possibility.    Patient is exhibiting large amounts of urine output here in the emergency department.  Patient does not appear to be volume depleted.  Will obtain serum osmolality, urine osmolality and serum sodium to determine etiology.  We will then initiate treatment based on these results.    Pneumonia of right upper lobe due to infectious organism   Evidence of right upper lobe scarring with infectious/inflammatory process  Patient reports 2-week history of malaise wheezing and dry cough  Covid PCR testing pending   Treating patient empirically with intravenous ceftriaxone and azithromycin  Blood cultures  Patient exhibits no oxygen requirement  Patient would benefit from repeat imaging of the chest in several weeks to document clearance    Pulmonary sarcoidosis ([place])   Existence of pulmonary sarcoidosis complicates evaluation of chest imaging  Outpatient follow-up    Essential hypertension   Continuing home regimen of amlodipine and olmesartan  Discontinuing home regimen of hydrochlorothiazide as this could possibly be precipitating patient's recurrent hyponatremia    Chronic diastolic CHF (congestive heart failure) ([facility])   No evidence of cardiogenic volume overload    Hallux rigidus of right foot    Right foot surgical boot in place post first MTP joint arthrodesis 1/19  Outpatient podiatry follow-up   Code Status:  DNR Family  Communication: deferred   Status is: Observation  The patient remains OBS appropriate and will d/c before 2 midnights.  Dispo: The patient is from: Home              Anticipated d/c is to: Home              Anticipated d/c date is: 2 days              Patient currently is not medically stable to d/c.   Difficult to place patient No        Vernelle Emerald MD [facility] Pager [phone]  If 7PM-7AM, please contact night-coverage [email or web address] Use universal [place] password for that web site. If you do not have the password, please call the hospital operator.  01/13/2021, 7:00 AM

## 2021-01-13 NOTE — ED Triage Notes (Signed)
Pt came in via EMS with c/o syncopal episode and fall. Pt BGL via EMS 111. Pt has swelling above L eye. Unknown LOC after fall and hitting head. Pt has ETOH on board.

## 2021-01-13 NOTE — ED Notes (Signed)
Pt ambulated approximately 71ft with walker and assistance. Pt c/o R foot pain and lightheadedness/dizziness throughout

## 2021-01-13 NOTE — ED Provider Notes (Signed)
[facility] DEPT Provider Note   CSN: [id number] Arrival date & time: 01/13/21  6578     History Chief Complaint  Patient presents with  . Loss of Consciousness  . Fall    Kathy [name] is a 70 y.o. female.  The history is provided by the patient and the EMS personnel. The history is limited by the condition of the patient.  Loss of Consciousness Episode history:  Single Most recent episode:  Today Duration: unknown  Timing:  Constant Progression:  Resolved Chronicity:  Recurrent Context: not blood draw, not bowel movement, not dehydration, not exertion, not inactivity, not medication change, not sitting down, not standing up and not urination   Witnessed: no   Relieved by:  Nothing Worsened by:  Nothing Ineffective treatments:  None tried Associated symptoms: confusion and recent surgery   Associated symptoms: no anxiety, no chest pain and no focal weakness   Risk factors: no congenital heart disease   Fall Pertinent negatives include no chest pain.  Patient with HTN and Sarcodosis with a h/o admission for syncope with hyponatremia in December 2021 presents following an unwitnessed LOC and was in bedroom.       Past Medical History:  Diagnosis Date  . Exotropia of left eye   . Fibromyalgia   . History of uterine leiomyoma   . Hypertension   . Macular degeneration   . OA (osteoarthritis)   . Overactive bladder   . Sarcoidosis   . Wears glasses     Patient Active Problem List   Diagnosis Date Noted  . Gastroenteritis 11/23/2020  . Syncope 11/21/2020  . Obese 06/16/2019  . S/P left TKA 06/15/2019  . Pain in left knee 02/08/2019  . Arthritis 10/28/2016  . Allergic rhinitis due to pollen 02/03/2016  . Exotropia 04/08/2015  . Macular degeneration 04/27/2012  . Fibromyalgia 02/22/2012  . SARCOIDOSIS 07/09/2010  . Essential hypertension 07/09/2010    Past Surgical History:  Procedure Laterality Date  . ABDOMINAL  HYSTERECTOMY  2013  . ADJUSTABLE SUTURE MANIPULATION Left 04/10/2015   Procedure: ADJUSTABLE SUTURE MANIPULATION;  Surgeon: Gevena Cotton, MD;  Location: [facility];  Service: Ophthalmology;  Laterality: Left;  . EYE SURGERY    . MEDIAN RECTUS REPAIR Left 04/10/2015   Procedure: MEDIAN RECTUS RESECTION LATERAL RECTUS RECESSION,AJUSTABLES SUTURES LEFT EYE;  Surgeon: Gevena Cotton, MD;  Location: [facility];  Service: Ophthalmology;  Laterality: Left;  . TONSILLECTOMY  as child  . TOTAL ABDOMINAL HYSTERECTOMY W/ BILATERAL SALPINGOOPHORECTOMY  10-14-2010   and TVT midurethral sling and culdoplasty  . TOTAL KNEE ARTHROPLASTY Left 06/15/2019   Procedure: TOTAL KNEE ARTHROPLASTY;  Surgeon: Paralee Cancel, MD;  Location: [facility];  Service: Orthopedics;  Laterality: Left;  70 mins     OB History   No obstetric history on file.     Family History  Problem Relation Age of Onset  . Colon cancer Mother   . Diabetes Father   . Diabetes Sister   . Hypertension Sister   . Diabetes Sister   . Hypertension Sister   . Hypertension Sister     Social History   Tobacco Use  . Smoking status: Never Smoker  . Smokeless tobacco: Never Used  Vaping Use  . Vaping Use: Never used  Substance Use Topics  . Alcohol use: Yes    Alcohol/week: 1.0 standard drink    Types: 1 Glasses of wine per week    Comment: rare  . Drug use:  No    Home Medications Prior to Admission medications   Medication Sig Start Date End Date Taking? Authorizing Provider  acetaminophen (TYLENOL) 500 MG tablet Take 2 tablets (1,000 mg total) by mouth every 8 (eight) hours as needed for moderate pain. 11/26/20   Hosie Poisson, MD  cycloSPORINE (RESTASIS) 0.05 % ophthalmic emulsion Place 1 drop into both eyes 2 (two) times daily.    [provider]  diclofenac Sodium (VOLTAREN) 1 % GEL APPLY 2 GRAMS TO AFFECTED AREA 4 TIMES A DAY 06/25/20   Trula Slade, DPM  ibuprofen (ADVIL) 600 MG  tablet Take 1 tablet (600 mg total) by mouth every 8 (eight) hours as needed. 12/25/20   Trula Slade, DPM  loperamide (IMODIUM) 2 MG capsule Take 1 capsule (2 mg total) by mouth as needed for diarrhea or loose stools. 11/26/20   Hosie Poisson, MD  melatonin 5 MG TABS Take 1 tablet (5 mg total) by mouth at bedtime. 11/26/20   Hosie Poisson, MD  Multiple Vitamins-Minerals (PRESERVISION AREDS 2) CAPS Take 1 capsule by mouth 2 (two) times a day.    [provider]  Olmesartan-amLODIPine-HCTZ 40-10-12.5 MG TABS Take 1 tablet by mouth daily. 09/02/20   Denita Lung, MD  ondansetron (ZOFRAN) 4 MG tablet Take 1 tablet (4 mg total) by mouth every 8 (eight) hours as needed for nausea or vomiting. 12/25/20   Trula Slade, DPM    Allergies    Lortab [hydrocodone-acetaminophen] and Latex  Review of Systems   Review of Systems  Unable to perform ROS: Mental status change  Respiratory: Negative for cough.   Cardiovascular: Positive for syncope. Negative for chest pain.  Genitourinary: Negative for dysuria.  Skin: Negative for rash.  Neurological: Negative for focal weakness.  Psychiatric/Behavioral: Positive for confusion.    Physical Exam Updated Vital Signs BP 111/69   Pulse 99   Temp (!) 97.5 F (36.4 C)   Resp 15   Ht 5\' 3"  (1.6 m)   Wt 77.1 kg   SpO2 96%   BMI 30.11 kg/m   Physical Exam Vitals and nursing note reviewed.  Constitutional:      General: She is not in acute distress.    Appearance: Normal appearance.  HENT:     Head: Normocephalic and atraumatic.      Nose: Nose normal.  Eyes:     Extraocular Movements: Extraocular movements intact.     Conjunctiva/sclera: Conjunctivae normal.     Pupils: Pupils are equal, round, and reactive to light.  Cardiovascular:     Rate and Rhythm: Regular rhythm. Tachycardia present.     Pulses: Normal pulses.     Heart sounds: Normal heart sounds.  Pulmonary:     Effort: Pulmonary effort is normal.     Breath  sounds: Normal breath sounds.  Abdominal:     General: Abdomen is flat. Bowel sounds are normal.     Palpations: Abdomen is soft.     Tenderness: There is no abdominal tenderness. There is no guarding.  Musculoskeletal:        General: Normal range of motion.     Cervical back: Normal range of motion and neck supple.       Legs:  Skin:    General: Skin is warm and dry.     Capillary Refill: Capillary refill takes less than 2 seconds.  Neurological:     General: No focal deficit present.     Mental Status: She is alert.  Deep Tendon Reflexes: Reflexes normal.     Comments: Patient is oriented to person and place but perseverates on words when she answers.  She repeatedly answers ok   Psychiatric:        Mood and Affect: Mood normal.        Behavior: Behavior normal.     ED Results / Procedures / Treatments   Labs (all labs ordered are listed, but only abnormal results are displayed) Results for orders placed or performed during the hospital encounter of 01/13/21  CBC with Differential/Platelet  Result Value Ref Range   WBC 4.0 4.0 - 10.5 K/uL   RBC 4.49 3.87 - 5.11 MIL/uL   Hemoglobin 14.4 12.0 - 15.0 g/dL   HCT 40.3 36.0 - 46.0 %   MCV 89.8 80.0 - 100.0 fL   MCH 32.1 26.0 - 34.0 pg   MCHC 35.7 30.0 - 36.0 g/dL   RDW 11.8 11.5 - 15.5 %   Platelets 359 150 - 400 K/uL   nRBC 0.0 0.0 - 0.2 %   Neutrophils Relative % 35 %   Neutro Abs 1.4 (L) 1.7 - 7.7 K/uL   Lymphocytes Relative 51 %   Lymphs Abs 2.1 0.7 - 4.0 K/uL   Monocytes Relative 11 %   Monocytes Absolute 0.4 0.1 - 1.0 K/uL   Eosinophils Relative 2 %   Eosinophils Absolute 0.1 0.0 - 0.5 K/uL   Basophils Relative 1 %   Basophils Absolute 0.0 0.0 - 0.1 K/uL   Immature Granulocytes 0 %   Abs Immature Granulocytes 0.01 0.00 - 0.07 K/uL  Urinalysis, Routine w reflex microscopic Urine, Clean Catch  Result Value Ref Range   Color, Urine COLORLESS (A) YELLOW   APPearance CLEAR CLEAR   Specific Gravity, Urine 1.009  1.005 - 1.030   pH 5.0 5.0 - 8.0   Glucose, UA NEGATIVE NEGATIVE mg/dL   Hgb urine dipstick NEGATIVE NEGATIVE   Bilirubin Urine NEGATIVE NEGATIVE   Ketones, ur NEGATIVE NEGATIVE mg/dL   Protein, ur NEGATIVE NEGATIVE mg/dL   Nitrite NEGATIVE NEGATIVE   Leukocytes,Ua NEGATIVE NEGATIVE  I-stat chem 8, ED (not at [facility] or [facility])  Result Value Ref Range   Sodium 127 (L) 135 - 145 mmol/L   Potassium 3.7 3.5 - 5.1 mmol/L   Chloride 91 (L) 98 - 111 mmol/L   BUN 8 8 - 23 mg/dL   Creatinine, Ser 1.20 (H) 0.44 - 1.00 mg/dL   Glucose, Bld 93 70 - 99 mg/dL   Calcium, Ion 1.01 (L) 1.15 - 1.40 mmol/L   TCO2 23 22 - 32 mmol/L   Hemoglobin 15.0 12.0 - 15.0 g/dL   HCT 44.0 36.0 - 46.0 %  Troponin I (High Sensitivity)  Result Value Ref Range   Troponin I (High Sensitivity) 3 <18 ng/L   CT Head Wo Contrast  Result Date: 01/13/2021 CLINICAL DATA:  Facial trauma status post fall with alcohol use EXAM: CT HEAD WITHOUT CONTRAST CT CERVICAL SPINE WITHOUT CONTRAST TECHNIQUE: Multidetector CT imaging of the head and cervical spine was performed following the standard protocol without intravenous contrast. Multiplanar CT image reconstructions of the cervical spine were also generated. COMPARISON:  CT head and cervical spine 11/21/2020 FINDINGS: CT HEAD FINDINGS Brain: Patchy and confluent areas of decreased attenuation are noted throughout the deep and periventricular white matter of the cerebral hemispheres bilaterally, compatible with chronic microvascular ischemic disease. No evidence of large-territorial acute infarction. No parenchymal hemorrhage. No mass lesion. No extra-axial collection. No mass effect or  midline shift. No hydrocephalus. Basilar cisterns are patent. Vascular: No hyperdense vessel. Skull: No acute fracture or focal lesion. Sinuses/Orbits: Paranasal sinuses and mastoid air cells are clear. Bilateral lens replacement. Otherwise the orbits are unremarkable. Other: None. CT CERVICAL SPINE FINDINGS  Alignment: Normal. Skull base and vertebrae: Similar-appearing multilevel degenerative changes of the spine that is most prominent at the C4 through C6 levels. No acute fracture. No aggressive appearing focal osseous lesion or focal pathologic process. Soft tissues and spinal canal: No prevertebral fluid or swelling. No visible canal hematoma. Upper chest: Unremarkable. Other: Mild carotid artery calcifications within the neck. Redemonstration of a subcentimeter hypodensity within the right thyroid gland. IMPRESSION: 1. No acute intracranial abnormality. 2. No acute displaced fracture or traumatic listhesis of the cervical spine. Electronically Signed   By: Iven Finn M.D.   On: 01/13/2021 04:23   CT Angio Chest PE W and/or Wo Contrast  Result Date: 01/13/2021 CLINICAL DATA:  Syncope, simple, normal neuro exam postoptachy withpassingout fall EXAM: CT ANGIOGRAPHY CHEST WITH CONTRAST TECHNIQUE: Multidetector CT imaging of the chest was performed using the standard protocol during bolus administration of intravenous contrast. Multiplanar CT image reconstructions and MIPs were obtained to evaluate the vascular anatomy. CONTRAST:  140mL OMNIPAQUE IOHEXOL 350 MG/ML SOLN COMPARISON:  Chest x-ray 04/24/2016 FINDINGS: Cardiovascular: Satisfactory opacification of the pulmonary arteries to the segmental level. No evidence of pulmonary embolism. Normal heart size. No significant pericardial effusion. The thoracic aorta is normal in caliber. Mild atherosclerotic plaque of the thoracic aorta. Mild coronary artery calcifications. Mediastinum/Nodes: Calcified mediastinal and bilateral hilar lymph nodes. No enlarged mediastinal, hilar, or axillary lymph nodes. Thyroid gland, trachea, and esophagus demonstrate no significant findings. Lungs/Pleura: Right upper lobe bronchiectasis and peribronchial consolidation. Nodular-like consolidations within the right upper lobe. No focal consolidation or marked scarring of the  remainder of the lungs. No pulmonary nodule or mass. No pleural effusion. No pneumothorax. Upper Abdomen: No acute abnormality. Musculoskeletal: No chest wall abnormality. No suspicious lytic or blastic osseous lesions. No acute displaced fracture. Multilevel degenerative changes of the spine. Review of the MIP images confirms the above findings. IMPRESSION: 1. Right upper lobe scarring with suggestion of superimposed infection/inflammation. Followup PA and lateral chest X-ray is recommended in 3-4 weeks following therapy to ensure resolution and exclude underlying malignancy. 2. No central or segmental pulmonary embolus. Limited evaluation more distally due to artifact. 3. Mediastinal and bilateral hilar calcified lymph nodes consistent with sarcoidosis. Electronically Signed   By: Iven Finn M.D.   On: 01/13/2021 04:35   CT Cervical Spine Wo Contrast  Result Date: 01/13/2021 CLINICAL DATA:  Facial trauma status post fall with alcohol use EXAM: CT HEAD WITHOUT CONTRAST CT CERVICAL SPINE WITHOUT CONTRAST TECHNIQUE: Multidetector CT imaging of the head and cervical spine was performed following the standard protocol without intravenous contrast. Multiplanar CT image reconstructions of the cervical spine were also generated. COMPARISON:  CT head and cervical spine 11/21/2020 FINDINGS: CT HEAD FINDINGS Brain: Patchy and confluent areas of decreased attenuation are noted throughout the deep and periventricular white matter of the cerebral hemispheres bilaterally, compatible with chronic microvascular ischemic disease. No evidence of large-territorial acute infarction. No parenchymal hemorrhage. No mass lesion. No extra-axial collection. No mass effect or midline shift. No hydrocephalus. Basilar cisterns are patent. Vascular: No hyperdense vessel. Skull: No acute fracture or focal lesion. Sinuses/Orbits: Paranasal sinuses and mastoid air cells are clear. Bilateral lens replacement. Otherwise the orbits are  unremarkable. Other: None. CT CERVICAL SPINE FINDINGS Alignment: Normal. Skull  base and vertebrae: Similar-appearing multilevel degenerative changes of the spine that is most prominent at the C4 through C6 levels. No acute fracture. No aggressive appearing focal osseous lesion or focal pathologic process. Soft tissues and spinal canal: No prevertebral fluid or swelling. No visible canal hematoma. Upper chest: Unremarkable. Other: Mild carotid artery calcifications within the neck. Redemonstration of a subcentimeter hypodensity within the right thyroid gland. IMPRESSION: 1. No acute intracranial abnormality. 2. No acute displaced fracture or traumatic listhesis of the cervical spine. Electronically Signed   By: Iven Finn M.D.   On: 01/13/2021 04:23   DG Foot Complete Right  Result Date: 12/23/2020 Please see detailed radiograph report in office note.   EKG None  Radiology CT Head Wo Contrast  Result Date: 01/13/2021 CLINICAL DATA:  Facial trauma status post fall with alcohol use EXAM: CT HEAD WITHOUT CONTRAST CT CERVICAL SPINE WITHOUT CONTRAST TECHNIQUE: Multidetector CT imaging of the head and cervical spine was performed following the standard protocol without intravenous contrast. Multiplanar CT image reconstructions of the cervical spine were also generated. COMPARISON:  CT head and cervical spine 11/21/2020 FINDINGS: CT HEAD FINDINGS Brain: Patchy and confluent areas of decreased attenuation are noted throughout the deep and periventricular white matter of the cerebral hemispheres bilaterally, compatible with chronic microvascular ischemic disease. No evidence of large-territorial acute infarction. No parenchymal hemorrhage. No mass lesion. No extra-axial collection. No mass effect or midline shift. No hydrocephalus. Basilar cisterns are patent. Vascular: No hyperdense vessel. Skull: No acute fracture or focal lesion. Sinuses/Orbits: Paranasal sinuses and mastoid air cells are clear. Bilateral  lens replacement. Otherwise the orbits are unremarkable. Other: None. CT CERVICAL SPINE FINDINGS Alignment: Normal. Skull base and vertebrae: Similar-appearing multilevel degenerative changes of the spine that is most prominent at the C4 through C6 levels. No acute fracture. No aggressive appearing focal osseous lesion or focal pathologic process. Soft tissues and spinal canal: No prevertebral fluid or swelling. No visible canal hematoma. Upper chest: Unremarkable. Other: Mild carotid artery calcifications within the neck. Redemonstration of a subcentimeter hypodensity within the right thyroid gland. IMPRESSION: 1. No acute intracranial abnormality. 2. No acute displaced fracture or traumatic listhesis of the cervical spine. Electronically Signed   By: Iven Finn M.D.   On: 01/13/2021 04:23   CT Angio Chest PE W and/or Wo Contrast  Result Date: 01/13/2021 CLINICAL DATA:  Syncope, simple, normal neuro exam postoptachy withpassingout fall EXAM: CT ANGIOGRAPHY CHEST WITH CONTRAST TECHNIQUE: Multidetector CT imaging of the chest was performed using the standard protocol during bolus administration of intravenous contrast. Multiplanar CT image reconstructions and MIPs were obtained to evaluate the vascular anatomy. CONTRAST:  160mL OMNIPAQUE IOHEXOL 350 MG/ML SOLN COMPARISON:  Chest x-ray 04/24/2016 FINDINGS: Cardiovascular: Satisfactory opacification of the pulmonary arteries to the segmental level. No evidence of pulmonary embolism. Normal heart size. No significant pericardial effusion. The thoracic aorta is normal in caliber. Mild atherosclerotic plaque of the thoracic aorta. Mild coronary artery calcifications. Mediastinum/Nodes: Calcified mediastinal and bilateral hilar lymph nodes. No enlarged mediastinal, hilar, or axillary lymph nodes. Thyroid gland, trachea, and esophagus demonstrate no significant findings. Lungs/Pleura: Right upper lobe bronchiectasis and peribronchial consolidation. Nodular-like  consolidations within the right upper lobe. No focal consolidation or marked scarring of the remainder of the lungs. No pulmonary nodule or mass. No pleural effusion. No pneumothorax. Upper Abdomen: No acute abnormality. Musculoskeletal: No chest wall abnormality. No suspicious lytic or blastic osseous lesions. No acute displaced fracture. Multilevel degenerative changes of the spine. Review of the MIP  images confirms the above findings. IMPRESSION: 1. Right upper lobe scarring with suggestion of superimposed infection/inflammation. Followup PA and lateral chest X-ray is recommended in 3-4 weeks following therapy to ensure resolution and exclude underlying malignancy. 2. No central or segmental pulmonary embolus. Limited evaluation more distally due to artifact. 3. Mediastinal and bilateral hilar calcified lymph nodes consistent with sarcoidosis. Electronically Signed   By: Iven Finn M.D.   On: 01/13/2021 04:35   CT Cervical Spine Wo Contrast  Result Date: 01/13/2021 CLINICAL DATA:  Facial trauma status post fall with alcohol use EXAM: CT HEAD WITHOUT CONTRAST CT CERVICAL SPINE WITHOUT CONTRAST TECHNIQUE: Multidetector CT imaging of the head and cervical spine was performed following the standard protocol without intravenous contrast. Multiplanar CT image reconstructions of the cervical spine were also generated. COMPARISON:  CT head and cervical spine 11/21/2020 FINDINGS: CT HEAD FINDINGS Brain: Patchy and confluent areas of decreased attenuation are noted throughout the deep and periventricular white matter of the cerebral hemispheres bilaterally, compatible with chronic microvascular ischemic disease. No evidence of large-territorial acute infarction. No parenchymal hemorrhage. No mass lesion. No extra-axial collection. No mass effect or midline shift. No hydrocephalus. Basilar cisterns are patent. Vascular: No hyperdense vessel. Skull: No acute fracture or focal lesion. Sinuses/Orbits: Paranasal  sinuses and mastoid air cells are clear. Bilateral lens replacement. Otherwise the orbits are unremarkable. Other: None. CT CERVICAL SPINE FINDINGS Alignment: Normal. Skull base and vertebrae: Similar-appearing multilevel degenerative changes of the spine that is most prominent at the C4 through C6 levels. No acute fracture. No aggressive appearing focal osseous lesion or focal pathologic process. Soft tissues and spinal canal: No prevertebral fluid or swelling. No visible canal hematoma. Upper chest: Unremarkable. Other: Mild carotid artery calcifications within the neck. Redemonstration of a subcentimeter hypodensity within the right thyroid gland. IMPRESSION: 1. No acute intracranial abnormality. 2. No acute displaced fracture or traumatic listhesis of the cervical spine. Electronically Signed   By: Iven Finn M.D.   On: 01/13/2021 04:23    Procedures Procedures   Medications Ordered in ED Medications  sodium chloride 0.9 % bolus 500 mL (has no administration in time range)  iohexol (OMNIPAQUE) 350 MG/ML injection 100 mL (100 mLs Intravenous Contrast Given 01/13/21 0355)    ED Course  I have reviewed the triage vital signs and the nursing notes.  Pertinent labs & imaging results that were available during my care of the patient were reviewed by me and considered in my medical decision making (see chart for details).   Second syncopal event, again associated with hyponatremia.  Also infection in the RUL on CTA,  Will treat for HCAP given when she was last admitted.  Will need a repeat CXR in 4 weeks to confirm no mass.     Kathy [name] was evaluated in Emergency Department on 01/13/2021 for the symptoms described in the history of present illness. She was evaluated in the context of the global COVID-19 pandemic, which necessitated consideration that the patient might be at risk for infection with the SARS-CoV-2 virus that causes COVID-19. Institutional protocols and algorithms that  pertain to the evaluation of patients at risk for COVID-19 are in a state of rapid change based on information released by regulatory bodies including the CDC and federal and state organizations. These policies and algorithms were followed during the patient's care in the ED.  Final Clinical Impression(s) / ED Diagnoses  Admit to medicine for AMS with hyponatremia and HCAP and recurrent syncope  Patient will need  repeat CXR or CT in 4 weeks to ensure no underlying mass per radiology report.      Joeline Freer, MD 01/13/21 0600

## 2021-01-13 NOTE — Care Plan (Signed)
This 70 years old female with PMH significant for HTN, osteoarthritis, fibromyalgia, diastolic CHF( Echo 17/47: LVEF 55 to 60%), pulmonary sarcoidosis, hallux rigidus of first toe of right foot s/p first metatarsophalangeal arthrodesis  presents in the emergency department s/p an episode of loss of consciousness. CT head unremarkable, CT cervical spine was unremarkable.  UA negative.  CT chest was negative for PE but did reveal an area of right upper lobe with scarring and superimposed infectious or inflammatory process concerning for pneumonia.  Patient is started on ceftriaxone and Zithromax for community-acquired pneumonia.  EEG no evidence of seizures.  Patient is also found to have hyponatremia serum sodium is improving with IV fluids.  She does not appear to be volume overloaded.  Patient was seen and examined in the ED.

## 2021-01-14 DIAGNOSIS — I5032 Chronic diastolic (congestive) heart failure: Secondary | ICD-10-CM | POA: Diagnosis not present

## 2021-01-14 LAB — LACTIC ACID, PLASMA
Lactic Acid, Venous: 2.9 mmol/L (ref 0.5–1.9)
Lactic Acid, Venous: 4.9 mmol/L (ref 0.5–1.9)

## 2021-01-14 LAB — PROCALCITONIN: Procalcitonin: <0.1 ng/mL

## 2021-01-14 LAB — CBC WITH DIFFERENTIAL/PLATELET
Abs Immature Granulocytes: 0.01 10*3/uL (ref 0.00–0.07)
Basophils Absolute: 0 10*3/uL (ref 0.0–0.1)
Basophils Relative: 1 %
Eosinophils Absolute: 0.1 10*3/uL (ref 0.0–0.5)
Eosinophils Relative: 3 %
HCT: 35.3 % — ABNORMAL LOW (ref 36.0–46.0)
Hemoglobin: 12.3 g/dL (ref 12.0–15.0)
Immature Granulocytes: 0 %
Lymphocytes Relative: 33 %
Lymphs Abs: 1.2 10*3/uL (ref 0.7–4.0)
MCH: 32 pg (ref 26.0–34.0)
MCHC: 34.8 g/dL (ref 30.0–36.0)
MCV: 91.9 fL (ref 80.0–100.0)
Monocytes Absolute: 0.7 10*3/uL (ref 0.1–1.0)
Monocytes Relative: 20 %
Neutro Abs: 1.5 10*3/uL — ABNORMAL LOW (ref 1.7–7.7)
Neutrophils Relative %: 43 %
Platelets: 270 10*3/uL (ref 150–400)
RBC: 3.84 MIL/uL — ABNORMAL LOW (ref 3.87–5.11)
RDW: 12.3 % (ref 11.5–15.5)
WBC: 3.6 10*3/uL — ABNORMAL LOW (ref 4.0–10.5)
nRBC: 0 % (ref 0.0–0.2)

## 2021-01-14 LAB — COMPREHENSIVE METABOLIC PANEL
ALT: 11 U/L (ref 0–44)
AST: 22 U/L (ref 15–41)
Albumin: 3.5 g/dL (ref 3.5–5.0)
Alkaline Phosphatase: 85 U/L (ref 38–126)
Anion gap: 11 (ref 5–15)
BUN: 7 mg/dL — ABNORMAL LOW (ref 8–23)
CO2: 23 mmol/L (ref 22–32)
Calcium: 8.5 mg/dL — ABNORMAL LOW (ref 8.9–10.3)
Chloride: 101 mmol/L (ref 98–111)
Creatinine, Ser: 0.57 mg/dL (ref 0.44–1.00)
GFR, Estimated: >60 mL/min (ref >60)
Glucose, Bld: 98 mg/dL (ref 70–99)
Potassium: 3.5 mmol/L (ref 3.5–5.1)
Sodium: 135 mmol/L (ref 135–145)
Total Bilirubin: 1.5 mg/dL — ABNORMAL HIGH (ref 0.3–1.2)
Total Protein: 6.1 g/dL — ABNORMAL LOW (ref 6.5–8.1)

## 2021-01-14 LAB — GLUCOSE, CAPILLARY: Glucose-Capillary: 102 mg/dL — ABNORMAL HIGH (ref 70–99)

## 2021-01-14 LAB — PHOSPHORUS: Phosphorus: 3.6 mg/dL (ref 2.5–4.6)

## 2021-01-14 LAB — MAGNESIUM: Magnesium: 2.3 mg/dL (ref 1.7–2.4)

## 2021-01-14 MED ORDER — SODIUM CHLORIDE 0.9 % IV SOLN: INTRAVENOUS | Status: DC

## 2021-01-14 MED ORDER — AZITHROMYCIN 250 MG PO TABS
500.0000 mg | ORAL_TABLET | Freq: Every day | ORAL | Status: AC
  Administered 2021-01-15 – 2021-01-17 (×3): 500 mg via ORAL
  Filled 2021-01-14 (×3): qty 2

## 2021-01-14 NOTE — Progress Notes (Addendum)
PROGRESS NOTE    Kathy [name]  WCB:[id number] DOB: 1950/12/19 DOA: 01/13/2021 PCP: Denita Lung, MD   Brief Narrative:  This 70 years old female with PMH significant for HTN, osteoarthritis, fibromyalgia, diastolic CHF( Echo 61/60: LVEF 55 to 60%), pulmonary sarcoidosis, hallux rigidus of first toe of right foot s/p first metatarsophalangeal arthrodesis  presents in the emergency department s/p an episode of loss of consciousness. CT head unremarkable, CT cervical spine was unremarkable.  UA negative.  CT chest was negative for PE but did reveal an area of right upper lobe with scarring and superimposed infectious or inflammatory process concerning for pneumonia. Patient is started on ceftriaxone and Zithromax for community-acquired pneumonia.  EEG: no evidence of seizures.  Patient is also found to have hyponatremia,  serum sodium is improving with IV fluids.  She does not appear to be volume overloaded.   Assessment & Plan:   Principal Problem:   Loss of consciousness ([place]) Active Problems:   Pulmonary sarcoidosis (HCC)   Essential hypertension   Hyponatremia   Chronic diastolic CHF (congestive heart failure) (HCC)   Hallux rigidus of right foot   Pneumonia of right upper lobe due to infectious organism   Syncope:  Patient has experienced brief period of loss of consciousness. Patient was last hospitalized for this on 12/23 until 12/28 at [facility] and at the time was felt to have suffered orthostatic syncope. Orthostatic vital signs were negative in the emergency department No evidence of arrhythmias in the emergency department No evidence of pulmonary embolism on CT angiogram of the chest. While patient may have a right upper lobe pneumonia , we donot  believe this is what precipitated the patient's episode. This could be syncope versus seizure.  Echocardiogram in 10/2020 was unremarkable for the most part.   EEG : No evidence of seizure activity.  Monitoring  patient on telemetry Patient is notably on hydrochlorothiazide.  This will be discontinued.   Hyponatremia >>>> Improved. She is found to have a serum sodium of 127 with mild lethargy. Differential could include HCTZ should be discontinued.  SIADH, patient with serum osmolality Serum sodium has improved to 133.  Recheck labs tomorrow morning.  Pneumonia of right upper lobe due to infectious organism: Patient found to have right upper lobe scarring with infectious or inflammatory process on chest x-ray. She also reports 2-week history of malaise, dry cough and wheezing. Covid test negative. Will treat empirically with ceftriaxone and Zithromax. Blood cultures no growth so far. Patient is not hypoxic.  Pulmonary sarcoidosis [facility]) Outpatient follow-up.  Essential hypertension: Continue amlodipine and olmesartan. Discontinue HCTZ.  Chronic diastolic CHF (congestive heart failure) [facility]): Patient does not appear volume overloaded. Recent Echo 12/21: LVEF 55 to 60%)     DVT prophylaxis:  SCDs Code Status: DNR Family Communication:spoke with son at bed side. Disposition Plan:    Status is: Observation  The patient remains OBS appropriate and will d/c before 2 midnights.  Dispo: The patient is from: Home              Anticipated d/c is to: Home              Anticipated d/c date is: 2 days              Patient currently is not medically stable to d/c.   Difficult to place patient No   Consultants:    None  Procedures: None Antimicrobials:   Anti-infectives (From admission, onward)   Start  Dose/Rate Route Frequency Ordered Stop   01/15/21 1000  azithromycin (ZITHROMAX) tablet 500 mg        500 mg Oral Daily 01/14/21 1108 01/18/21 0959   01/13/21 1800  cefTRIAXone (ROCEPHIN) 2 g in sodium chloride 0.9 % 100 mL IVPB        2 g 200 mL/hr over 30 Minutes Intravenous Every 24 hours 01/13/21 0641 01/18/21 1759   01/13/21 0800  azithromycin (ZITHROMAX) 500 mg in  sodium chloride 0.9 % 250 mL IVPB  Status:  Discontinued        500 mg 250 mL/hr over 60 Minutes Intravenous Every 24 hours 01/13/21 0641 01/14/21 1108   01/13/21 0545  vancomycin (VANCOCIN) IVPB 1000 mg/200 mL premix  Status:  Discontinued        1,000 mg 200 mL/hr over 60 Minutes Intravenous  Once 01/13/21 0536 01/13/21 0701   01/13/21 0545  ceFEPIme (MAXIPIME) 2 g in sodium chloride 0.9 % 100 mL IVPB        2 g 200 mL/hr over 30 Minutes Intravenous STAT 01/13/21 0536 01/13/21 0620      Subjective: Patient was seen and examined at bedside.  Overnight events noted.   Patient reports feeling weak and tired and dizzy when she stands out of the bed.  She denies any other concerns.  Objective: Vitals:   01/13/21 2208 01/14/21 0215 01/14/21 0500 01/14/21 0512  BP: 127/68 126/66  115/60  Pulse: 96 92  91  Resp: 14   14  Temp: 98.3 F (36.8 C) 98.6 F (37 C)  98 F (36.7 C)  TempSrc: Oral Oral  Oral  SpO2: 98% 97%  97%  Weight:   79.7 kg   Height:        Intake/Output Summary (Last 24 hours) at 01/14/2021 1549 Last data filed at 01/14/2021 0900 Gross per 24 hour  Intake 1833.3 ml  Output 3000 ml  Net -1166.7 ml   Filed Weights   01/13/21 0324 01/14/21 0500  Weight: 77.1 kg 79.7 kg    Examination:  General exam: Appears calm and comfortable, not in any acute distress. Respiratory system: Clear to auscultation. Respiratory effort normal. Cardiovascular system: S1 & S2 heard, RRR. No JVD, murmurs, rubs, gallops or clicks. No pedal edema. Gastrointestinal system: Abdomen is nondistended, soft and nontender. No organomegaly or masses felt. Normal bowel sounds heard. Central nervous system: Alert and oriented. No focal neurological deficits. Extremities: Symmetric 5 x 5 power. Skin: No rashes, lesions or ulcers Psychiatry: Judgement and insight appear normal. Mood & affect appropriate.     Data Reviewed: I have personally reviewed following labs and imaging  studies  CBC: Recent Labs  Lab 01/13/21 0324 01/13/21 0334 01/13/21 0903 01/14/21 0553  WBC 4.0  --  2.2* 3.6*  NEUTROABS 1.4*  --   --  1.5*  HGB 14.4 15.0 10.1* 12.3  HCT 40.3 44.0 28.5* 35.3*  MCV 89.8  --  91.6 91.9  PLT 359  --  215 370   Basic Metabolic Panel: Recent Labs  Lab 01/13/21 0334 01/13/21 0903 01/13/21 1124 01/14/21 0553  NA 127* 144 138 135  K 3.7 2.7* 3.6 3.5  CL 91* 109 100 101  CO2  --  17* 21* 23  GLUCOSE 93 64* 78 98  BUN 8 6* 8 7*  CREATININE 1.20* 0.30* 0.55 0.57  CALCIUM  --  5.9* 8.6* 8.5*  MG  --  1.6*  --  2.3  PHOS  --  2.6  --  3.6   GFR: Estimated Creatinine Clearance: 66.3 mL/min (by C-G formula based on SCr of 0.57 mg/dL). Liver Function Tests: Recent Labs  Lab 01/14/21 0553  AST 22  ALT 11  ALKPHOS 85  BILITOT 1.5*  PROT 6.1*  ALBUMIN 3.5   No results for input(s): LIPASE, AMYLASE in the last 168 hours. No results for input(s): AMMONIA in the last 168 hours. Coagulation Profile: No results for input(s): INR, PROTIME in the last 168 hours. Cardiac Enzymes: No results for input(s): CKTOTAL, CKMB, CKMBINDEX, TROPONINI in the last 168 hours. BNP (last 3 results) No results for input(s): PROBNP in the last 8760 hours. HbA1C: No results for input(s): HGBA1C in the last 72 hours. CBG: Recent Labs  Lab 01/14/21 0619  GLUCAP 102*   Lipid Profile: No results for input(s): CHOL, HDL, LDLCALC, TRIG, CHOLHDL, LDLDIRECT in the last 72 hours. Thyroid Function Tests: No results for input(s): TSH, T4TOTAL, FREET4, T3FREE, THYROIDAB in the last 72 hours. Anemia Panel: No results for input(s): VITAMINB12, FOLATE, FERRITIN, TIBC, IRON, RETICCTPCT in the last 72 hours. Sepsis Labs: Recent Labs  Lab 01/13/21 0647  LATICACIDVEN 3.5*    Recent Results (from the past 240 hour(s))  Blood culture (routine x 2)     Status: None (Preliminary result)   Collection Time: 01/13/21  5:30 AM   Specimen: Bronchoalveolar Lavage; Blood   Result Value Ref Range Status   Specimen Description   Final    BRONCHIAL ALVEOLAR LAVAGE RIGHT ANTECUBITAL Performed at [facility] [address] [place], [place] [address]    Special Requests   Final    BOTTLES DRAWN AEROBIC AND ANAEROBIC Blood Culture results may not be optimal due to an inadequate volume of blood received in culture bottles Performed at [facility] [address], [place], [place] [address]    Culture   Final    NO GROWTH < 12 HOURS Performed at [facility] [address] [place], [place] [address]    Report Status PENDING  Incomplete  Blood culture (routine x 2)     Status: None (Preliminary result)   Collection Time: 01/13/21  5:45 AM   Specimen: Bronchoalveolar Lavage; Blood  Result Value Ref Range Status   Specimen Description   Final    BRONCHIAL ALVEOLAR LAVAGE RIGHT ANTECUBITAL Performed at [facility] [address] [place], [place] [address]    Special Requests   Final    BOTTLES DRAWN AEROBIC AND ANAEROBIC Blood Culture results may not be optimal due to an inadequate volume of blood received in culture bottles Performed at [facility] [address] [place], [place] [address]    Culture   Final    NO GROWTH < 12 HOURS Performed at [facility] [address] [place], [place] [address]    Report Status PENDING  Incomplete  Resp Panel by RT-PCR (Flu A&B, Covid) Nasopharyngeal Swab     Status: None   Collection Time: 01/13/21  6:15 AM   Specimen: Nasopharyngeal Swab; Nasopharyngeal(NP) swabs in vial transport medium  Result Value Ref Range Status   SARS Coronavirus 2 by RT PCR NEGATIVE NEGATIVE Final    Comment: (NOTE) SARS-CoV-2 target nucleic acids are NOT DETECTED.  The SARS-CoV-2 RNA is generally detectable in upper respiratory specimens during the acute phase of infection. The lowest concentration of SARS-CoV-2 viral copies this assay can  detect is 138 copies/mL. A negative result does not preclude SARS-Cov-2 infection  and should not be used as the sole basis for treatment or other patient management decisions. A negative result may occur with  improper specimen collection/handling, submission of specimen other than nasopharyngeal swab, presence of viral mutation(s) within the areas targeted by this assay, and inadequate number of viral copies(<138 copies/mL). A negative result must be combined with clinical observations, patient history, and epidemiological information. The expected result is Negative.  Fact Sheet for Patients:  EntrepreneurPulse.com.au  Fact Sheet for Healthcare Providers:  IncredibleEmployment.be  This test is no t yet approved or cleared by the [place] FDA and  has been authorized for detection and/or diagnosis of SARS-CoV-2 by FDA under an Emergency Use Authorization (EUA). This EUA will remain  in effect (meaning this test can be used) for the duration of the COVID-19 declaration under Section 564(b)(1) of the Act, 21 U.S.C.section 360bbb-3(b)(1), unless the authorization is terminated  or revoked sooner.       Influenza A by PCR NEGATIVE NEGATIVE Final   Influenza B by PCR NEGATIVE NEGATIVE Final    Comment: (NOTE) The Xpert Xpress SARS-CoV-2/FLU/RSV plus assay is intended as an aid in the diagnosis of influenza from Nasopharyngeal swab specimens and should not be used as a sole basis for treatment. Nasal washings and aspirates are unacceptable for Xpert Xpress SARS-CoV-2/FLU/RSV testing.  Fact Sheet for Patients: EntrepreneurPulse.com.au  Fact Sheet for Healthcare Providers: IncredibleEmployment.be  This test is not yet approved or cleared by the [place] FDA and has been authorized for detection and/or diagnosis of SARS-CoV-2 by FDA under an Emergency Use Authorization (EUA). This EUA will remain in  effect (meaning this test can be used) for the duration of the COVID-19 declaration under Section 564(b)(1) of the Act, 21 U.S.C. section 360bbb-3(b)(1), unless the authorization is terminated or revoked.  Performed at [facility], [place] [address] [place], [place] [address]     Radiology Studies: EEG  Result Date: 01/13/2021 Lora Havens, MD     01/13/2021 12:46 PM Patient Name: Kathy [name] MRN: [id number] Epilepsy Attending: Lora Havens Referring Physician/Provider: Dr. Inda Merlin Date:  01/13/2021 Duration: 23.12 mins Patient history: 70 year old female with transient loss of consciousness.  EEG to evaluate for seizures. Level of alertness: Awake, asleep AEDs during EEG study: None Technical aspects: This EEG study was done with scalp electrodes positioned according to the 10-20 International system of electrode placement. Electrical activity was acquired at a sampling rate of 500Hz  and reviewed with a high frequency filter of 70Hz  and a low frequency filter of 1Hz . EEG data were recorded continuously and digitally stored. Description: The posterior dominant rhythm consists of 8-9 Hz activity of moderate voltage (25-35 uV) seen predominantly in posterior head regions, symmetric and reactive to eye opening and eye closing. Sleep was characterized by vertex waves, sleep spindles (12 to 14 Hz), maximal frontocentral region. Physiologic photic driving was seen during photic stimulation.  Hyperventilation was not performed.   IMPRESSION: This study is within normal limits. No seizures or epileptiform discharges were seen throughout the recording. Lora Havens   CT Head Wo Contrast  Result Date: 01/13/2021 CLINICAL DATA:  Facial trauma status post fall with alcohol use EXAM: CT HEAD WITHOUT CONTRAST CT CERVICAL SPINE WITHOUT CONTRAST TECHNIQUE: Multidetector CT imaging of the head and cervical spine was performed following the standard protocol without  intravenous contrast. Multiplanar CT image reconstructions of the cervical spine were also generated. COMPARISON:  CT head and cervical spine 11/21/2020 FINDINGS: CT HEAD FINDINGS  Brain: Patchy and confluent areas of decreased attenuation are noted throughout the deep and periventricular white matter of the cerebral hemispheres bilaterally, compatible with chronic microvascular ischemic disease. No evidence of large-territorial acute infarction. No parenchymal hemorrhage. No mass lesion. No extra-axial collection. No mass effect or midline shift. No hydrocephalus. Basilar cisterns are patent. Vascular: No hyperdense vessel. Skull: No acute fracture or focal lesion. Sinuses/Orbits: Paranasal sinuses and mastoid air cells are clear. Bilateral lens replacement. Otherwise the orbits are unremarkable. Other: None. CT CERVICAL SPINE FINDINGS Alignment: Normal. Skull base and vertebrae: Similar-appearing multilevel degenerative changes of the spine that is most prominent at the C4 through C6 levels. No acute fracture. No aggressive appearing focal osseous lesion or focal pathologic process. Soft tissues and spinal canal: No prevertebral fluid or swelling. No visible canal hematoma. Upper chest: Unremarkable. Other: Mild carotid artery calcifications within the neck. Redemonstration of a subcentimeter hypodensity within the right thyroid gland. IMPRESSION: 1. No acute intracranial abnormality. 2. No acute displaced fracture or traumatic listhesis of the cervical spine. Electronically Signed   By: Iven Finn M.D.   On: 01/13/2021 04:23   CT Angio Chest PE W and/or Wo Contrast  Result Date: 01/13/2021 CLINICAL DATA:  Syncope, simple, normal neuro exam postoptachy withpassingout fall EXAM: CT ANGIOGRAPHY CHEST WITH CONTRAST TECHNIQUE: Multidetector CT imaging of the chest was performed using the standard protocol during bolus administration of intravenous contrast. Multiplanar CT image reconstructions and MIPs were  obtained to evaluate the vascular anatomy. CONTRAST:  13mL OMNIPAQUE IOHEXOL 350 MG/ML SOLN COMPARISON:  Chest x-ray 04/24/2016 FINDINGS: Cardiovascular: Satisfactory opacification of the pulmonary arteries to the segmental level. No evidence of pulmonary embolism. Normal heart size. No significant pericardial effusion. The thoracic aorta is normal in caliber. Mild atherosclerotic plaque of the thoracic aorta. Mild coronary artery calcifications. Mediastinum/Nodes: Calcified mediastinal and bilateral hilar lymph nodes. No enlarged mediastinal, hilar, or axillary lymph nodes. Thyroid gland, trachea, and esophagus demonstrate no significant findings. Lungs/Pleura: Right upper lobe bronchiectasis and peribronchial consolidation. Nodular-like consolidations within the right upper lobe. No focal consolidation or marked scarring of the remainder of the lungs. No pulmonary nodule or mass. No pleural effusion. No pneumothorax. Upper Abdomen: No acute abnormality. Musculoskeletal: No chest wall abnormality. No suspicious lytic or blastic osseous lesions. No acute displaced fracture. Multilevel degenerative changes of the spine. Review of the MIP images confirms the above findings. IMPRESSION: 1. Right upper lobe scarring with suggestion of superimposed infection/inflammation. Followup PA and lateral chest X-ray is recommended in 3-4 weeks following therapy to ensure resolution and exclude underlying malignancy. 2. No central or segmental pulmonary embolus. Limited evaluation more distally due to artifact. 3. Mediastinal and bilateral hilar calcified lymph nodes consistent with sarcoidosis. Electronically Signed   By: Iven Finn M.D.   On: 01/13/2021 04:35   CT Cervical Spine Wo Contrast  Result Date: 01/13/2021 CLINICAL DATA:  Facial trauma status post fall with alcohol use EXAM: CT HEAD WITHOUT CONTRAST CT CERVICAL SPINE WITHOUT CONTRAST TECHNIQUE: Multidetector CT imaging of the head and cervical spine was  performed following the standard protocol without intravenous contrast. Multiplanar CT image reconstructions of the cervical spine were also generated. COMPARISON:  CT head and cervical spine 11/21/2020 FINDINGS: CT HEAD FINDINGS Brain: Patchy and confluent areas of decreased attenuation are noted throughout the deep and periventricular white matter of the cerebral hemispheres bilaterally, compatible with chronic microvascular ischemic disease. No evidence of large-territorial acute infarction. No parenchymal hemorrhage. No mass lesion. No extra-axial collection. No mass effect  or midline shift. No hydrocephalus. Basilar cisterns are patent. Vascular: No hyperdense vessel. Skull: No acute fracture or focal lesion. Sinuses/Orbits: Paranasal sinuses and mastoid air cells are clear. Bilateral lens replacement. Otherwise the orbits are unremarkable. Other: None. CT CERVICAL SPINE FINDINGS Alignment: Normal. Skull base and vertebrae: Similar-appearing multilevel degenerative changes of the spine that is most prominent at the C4 through C6 levels. No acute fracture. No aggressive appearing focal osseous lesion or focal pathologic process. Soft tissues and spinal canal: No prevertebral fluid or swelling. No visible canal hematoma. Upper chest: Unremarkable. Other: Mild carotid artery calcifications within the neck. Redemonstration of a subcentimeter hypodensity within the right thyroid gland. IMPRESSION: 1. No acute intracranial abnormality. 2. No acute displaced fracture or traumatic listhesis of the cervical spine. Electronically Signed   By: Iven Finn M.D.   On: 01/13/2021 04:23   Scheduled Meds: . amLODipine  10 mg Oral Daily  . [START ON 01/15/2021] azithromycin  500 mg Oral Daily  . cycloSPORINE  1 drop Both Eyes BID  . enoxaparin (LOVENOX) injection  40 mg Subcutaneous Q24H  . irbesartan  300 mg Oral Daily  . melatonin  5 mg Oral QHS  . sodium chloride flush  3 mL Intravenous Q12H   Continuous  Infusions: . sodium chloride 100 mL/hr at 01/14/21 0406  . cefTRIAXone (ROCEPHIN)  IV Stopped (01/13/21 1817)     LOS: 0 days    Time spent: 35 mins    Shoua Ressler, MD [facility]   If 7PM-7AM, please contact night-coverage

## 2021-01-14 NOTE — Progress Notes (Signed)
PHARMACIST - PHYSICIAN COMMUNICATION  CONCERNING: Antibiotic IV to Oral Route Change Policy  RECOMMENDATION: This patient is receiving azithromycin by the intravenous route.  Based on criteria approved by the Pharmacy and Therapeutics Committee, the antibiotic(s) is/are being converted to the equivalent oral dose form(s).   DESCRIPTION: These criteria include:  Patient being treated for a respiratory tract infection, urinary tract infection, cellulitis or clostridium difficile associated diarrhea if on metronidazole  The patient is not neutropenic and does not exhibit a GI malabsorption state  The patient is eating (either orally or via tube) and/or has been taking other orally administered medications for a least 24 hours  The patient is improving clinically and has a Tmax < 100.5  If you have questions about this conversion, please contact the Pharmacy Department  []  ( [phone] )  [place] []  ( [phone]  [facility] []  ( [phone]  [place] []  ( [phone]  [facility] [x]  ( [phone] )  [facility]  

## 2021-01-15 ENCOUNTER — Telehealth: Payer: Self-pay | Admitting: Podiatry

## 2021-01-15 ENCOUNTER — Observation Stay (HOSPITAL_COMMUNITY): Payer: Medicare Other

## 2021-01-15 ENCOUNTER — Encounter (HOSPITAL_COMMUNITY): Payer: Self-pay | Admitting: Internal Medicine

## 2021-01-15 DIAGNOSIS — I700 Atherosclerosis of aorta: Secondary | ICD-10-CM | POA: Diagnosis not present

## 2021-01-15 DIAGNOSIS — R55 Syncope and collapse: Secondary | ICD-10-CM | POA: Diagnosis not present

## 2021-01-15 DIAGNOSIS — I6782 Cerebral ischemia: Secondary | ICD-10-CM | POA: Diagnosis not present

## 2021-01-15 DIAGNOSIS — I5032 Chronic diastolic (congestive) heart failure: Secondary | ICD-10-CM | POA: Diagnosis not present

## 2021-01-15 LAB — BASIC METABOLIC PANEL
Anion gap: 12 (ref 5–15)
BUN: <5 mg/dL — ABNORMAL LOW (ref 8–23)
CO2: 23 mmol/L (ref 22–32)
Calcium: 9 mg/dL (ref 8.9–10.3)
Chloride: 104 mmol/L (ref 98–111)
Creatinine, Ser: 0.44 mg/dL (ref 0.44–1.00)
GFR, Estimated: >60 mL/min (ref >60)
Glucose, Bld: 93 mg/dL (ref 70–99)
Potassium: 3 mmol/L — ABNORMAL LOW (ref 3.5–5.1)
Sodium: 139 mmol/L (ref 135–145)

## 2021-01-15 LAB — GLUCOSE, CAPILLARY
Glucose-Capillary: 95 mg/dL (ref 70–99)
Glucose-Capillary: 92 mg/dL (ref 70–99)
Glucose-Capillary: 98 mg/dL (ref 70–99)

## 2021-01-15 LAB — CBC
HCT: 38.3 % (ref 36.0–46.0)
Hemoglobin: 13.3 g/dL (ref 12.0–15.0)
MCH: 32.4 pg (ref 26.0–34.0)
MCHC: 34.7 g/dL (ref 30.0–36.0)
MCV: 93.2 fL (ref 80.0–100.0)
Platelets: 244 10*3/uL (ref 150–400)
RBC: 4.11 MIL/uL (ref 3.87–5.11)
RDW: 12.2 % (ref 11.5–15.5)
WBC: 3.7 10*3/uL — ABNORMAL LOW (ref 4.0–10.5)
nRBC: 0 % (ref 0.0–0.2)

## 2021-01-15 LAB — MAGNESIUM: Magnesium: 1.8 mg/dL (ref 1.7–2.4)

## 2021-01-15 LAB — LEGIONELLA PNEUMOPHILA SEROGP 1 UR AG: L. pneumophila Serogp 1 Ur Ag: NEGATIVE

## 2021-01-15 LAB — PHOSPHORUS: Phosphorus: 3.2 mg/dL (ref 2.5–4.6)

## 2021-01-15 LAB — LACTIC ACID, PLASMA: Lactic Acid, Venous: 1 mmol/L (ref 0.5–1.9)

## 2021-01-15 MED ORDER — POTASSIUM CHLORIDE CRYS ER 20 MEQ PO TBCR
40.0000 meq | EXTENDED_RELEASE_TABLET | Freq: Once | ORAL | Status: AC
  Administered 2021-01-15: 40 meq via ORAL
  Filled 2021-01-15: qty 2

## 2021-01-15 MED ORDER — SODIUM CHLORIDE 0.9 % IV SOLN: INTRAVENOUS | Status: AC

## 2021-01-15 MED ORDER — POTASSIUM CHLORIDE 10 MEQ/100ML IV SOLN
10.0000 meq | INTRAVENOUS | Status: AC
  Administered 2021-01-15: 10 meq via INTRAVENOUS
  Filled 2021-01-15: qty 100

## 2021-01-15 MED ORDER — POTASSIUM CHLORIDE 10 MEQ/100ML IV SOLN: 10.0000 meq | INTRAVENOUS | Status: DC

## 2021-01-15 MED ORDER — IRBESARTAN 300 MG PO TABS: 300.0000 mg | ORAL_TABLET | Freq: Every day | ORAL | Status: DC

## 2021-01-15 MED ORDER — POTASSIUM CHLORIDE CRYS ER 20 MEQ PO TBCR: 40.0000 meq | EXTENDED_RELEASE_TABLET | Freq: Once | ORAL | Status: DC

## 2021-01-15 MED ORDER — IOHEXOL 350 MG/ML SOLN
75.0000 mL | Freq: Once | INTRAVENOUS | Status: AC | PRN
  Administered 2021-01-15: 75 mL via INTRAVENOUS

## 2021-01-15 MED ORDER — SODIUM CHLORIDE 0.9 % IV BOLUS
500.0000 mL | Freq: Once | INTRAVENOUS | Status: AC
  Administered 2021-01-15: 500 mL via INTRAVENOUS

## 2021-01-15 MED ORDER — POTASSIUM CHLORIDE CRYS ER 20 MEQ PO TBCR
30.0000 meq | EXTENDED_RELEASE_TABLET | Freq: Once | ORAL | Status: AC
  Administered 2021-01-15: 30 meq via ORAL
  Filled 2021-01-15: qty 1

## 2021-01-15 NOTE — Progress Notes (Signed)
PROGRESS NOTE    Kathy [name]  UJW:[id number] DOB: [birth year] DOA: 01/13/2021 PCP: Denita Lung, MD   Chief Complaint  Patient presents with  . Loss of Consciousness  . Fall   Brief Narrative: 20yof w/ past medical history of hypertension, osteoarthritis, fibromyalgia, chronic diastolic CHF Echo 56/21 EF 55 to 60%, pulmonary sarcoidosis, hallux rigidus of first toe s/p first MTP arthrodesis presented to the ED after episode of syncope while getting up and coming out from her bathroom.  She was also recently admitted from 12/23 through 12/28 after a syncopal episode-following recent antibiotic for UTI after which she had couple days of nausea vomiting abdominal pain and diarrhea subsequently leading to episode of syncope at home while getting up to go to the bathroom-suspected to be due to vasovagal episode/orthostatic hypotension had negative work-up with echocardiogram, CT head, was also treated for nausea abdominal pain and colitis. She presented to the ED after episode of syncope 2/15 on admission CT head CT C-spine unremarkable, UA negative CT chest negative for PE but area of right upper lobe with scarring and superimposed infectious or inflammatory process concerning for pneumonia, start on ceftriaxone azithromycin for community-acquired pneumonia and was admitted. Patient underwent further work-up-negative orthostatic vitals, EEG no acute finding.  Subjective: Seen this morning,complains of headache, not feeling well-does not feel strong enough to go home today Afebrile   Assessment & Plan:  Syncopal episode: recurrent in Dec No evidence of seizure and EEG, CT head, CT C-spine no acute finding.  Episode happened after getting up from the bathroom question orthostatic.  Blood pressure stable although on arms and amlodipine was holding in 120s and I have discontinued them for today.  Unable to bear weight on her foot.  Will monitor electrolytes sodium has improved still with low  potassium.  Previous episode.  To be orthostatic in the setting of diarrhea/colitis back in [facility] she also had echocardiogram.  Continue monitoring telemetry, monitor orthostatics, hold antihypertensive.  Patient not feeling well, will monitor another day keeping gentle IV hydration. I discussed/consulted w/ w/ Dr Charlsie Merles from neurology-due to recurrent episode he advised to get vascular imaging CT angio head and neck with and without and also 30-day event monitoring as OP.  Lactic acidosis as high as 4.9 suspect in the setting of dehydration.  No abdominal pain or tenderness this morning lactic acidosis resolved.    Hypokalemia rep;eting with p.o. and IV potassium  Hyponatremia likely in the setting of hydrochlorothiazide/dehydration.  Improved HCTZ on hold  Community-acquired pneumonia right upper lobe: Continue ceftriaxone/azithromycin WBC count temperature curve stable complains of cough ongoing x2 weeks  Pulmonary sarcoidosis history outpatient follow-up Essential hypertension: Blood pressure remain soft/stable I have held amlodipine ARB and HCTZ-given patient's recurrent syncope. Chronic diastolic CHF history EF 50 to 30% grade 1 diastolic dysfunction.  No signs of fluid overload  Hallux rigidus of right foot: Status post first MTP arthrodesis 1/19.  Not able to bear weight on right foot.  Morbid obesity BMI more than 30 outpatient follow-up  Nutrition: Diet Order            Diet 2 gram sodium Room service appropriate? Yes; Fluid consistency: Thin  Diet effective now               Pt's Body mass index is 30.19 kg/m. DVT prophylaxis: enoxaparin (LOVENOX) injection 40 mg Start: 01/13/21 1000 Code Status:   Code Status: DNR Family Communication: plan of care discussed with patient at bedside.  Status is: ADMITTED  AS Observation  Patient continues to require hospitalization for ongoing management of her pneumonia, general feeling of unwell, recurrent syncope.  Dispo: The  patient is from: Home              Anticipated d/c is to: Home              Anticipated d/c date is: 1 day              Patient currently is not medically stable to d/c.   Difficult to place patient No Consultants:see note  Procedures:see note  Unresulted Labs (From admission, onward)          Start     Ordered   01/13/21 0642  Legionella Pneumophila Serogp 1 Ur Ag  Once,   STAT        01/13/21 0641          Culture/Microbiology    Component Value Date/Time   SDES  01/13/2021 0545    BRONCHIAL ALVEOLAR LAVAGE RIGHT ANTECUBITAL Performed at [facility], [place] [address], [place], [place] [address]    [place]  01/13/2021 0545    BOTTLES DRAWN AEROBIC AND ANAEROBIC Blood Culture results may not be optimal due to an inadequate volume of blood received in culture bottles Performed at [facility], [place] [address], [place], [place] [address]    CULT  01/13/2021 0545    NO GROWTH 2 DAYS Performed at [facility], [place] [address], [place], [place] [address]    REPTSTATUS PENDING 01/13/2021 0545    Other culture-see note  Medications: Scheduled Meds: . azithromycin  500 mg Oral Daily  . cycloSPORINE  1 drop Both Eyes BID  . enoxaparin (LOVENOX) injection  40 mg Subcutaneous Q24H  . [START ON 01/16/2021] irbesartan  300 mg Oral Daily  . melatonin  5 mg Oral QHS  . sodium chloride flush  3 mL Intravenous Q12H   Continuous Infusions: . cefTRIAXone (ROCEPHIN)  IV 200 mL/hr at 01/14/21 1732  . potassium chloride      Antimicrobials: Anti-infectives (From admission, onward)   Start     Dose/Rate Route Frequency Ordered Stop   01/15/21 1000  azithromycin (ZITHROMAX) tablet 500 mg        500 mg Oral Daily 01/14/21 1108 01/18/21 0959   01/13/21 1800  cefTRIAXone (ROCEPHIN) 2 g in sodium chloride 0.9 % 100 mL IVPB        2 g 200 mL/hr over 30 Minutes Intravenous Every 24 hours 01/13/21 0641 01/18/21 1759   01/13/21 0800  azithromycin  (ZITHROMAX) 500 mg in sodium chloride 0.9 % 250 mL IVPB  Status:  Discontinued        500 mg 250 mL/hr over 60 Minutes Intravenous Every 24 hours 01/13/21 0641 01/14/21 1108   01/13/21 0545  vancomycin (VANCOCIN) IVPB 1000 mg/200 mL premix  Status:  Discontinued        1,000 mg 200 mL/hr over 60 Minutes Intravenous  Once 01/13/21 0536 01/13/21 0701   01/13/21 0545  ceFEPIme (MAXIPIME) 2 g in sodium chloride 0.9 % 100 mL IVPB        2 g 200 mL/hr over 30 Minutes Intravenous STAT 01/13/21 0536 01/13/21 0620     Objective: Vitals: Today's Vitals   01/14/21 2102 01/14/21 2105 01/15/21 0515 01/15/21 1121  BP: 133/75  124/82 124/74  Pulse: 95  87 79  Resp: 16  14   Temp: 97.7 F (36.5 C)  98.1 F (36.7  C) 98.9 F (37.2 C)  TempSrc: Oral  Oral Oral  SpO2: 100%  100% 100%  Weight:   77.3 kg   Height:      PainSc:  0-No pain      Intake/Output Summary (Last 24 hours) at 01/15/2021 1344 Last data filed at 01/15/2021 1000 Gross per 24 hour  Intake 884.32 ml  Output 5700 ml  Net -4815.68 ml   Filed Weights   01/13/21 0324 01/14/21 0500 01/15/21 0515  Weight: 77.1 kg 79.7 kg 77.3 kg   Weight change: -2.4 kg  Intake/Output from previous day: 02/15 0701 - 02/16 0700 In: 1124.3 [P.O.:240; I.V.:782.2; IV Piggyback:102.2] Out: 5800 [Urine:5800] Intake/Output this shift: Total I/O In: -  Out: 900 [Urine:900] Filed Weights   01/13/21 0324 01/14/21 0500 01/15/21 0515  Weight: 77.1 kg 79.7 kg 77.3 kg    Examination: General exam: AAOX3 ,NAD, weak appearing. HEENT:Oral mucosa moist, Ear/Nose WNL grossly,dentition normal. Respiratory system: bilaterally CLEAR,no wheezing or crackles,no use of accessory muscle, non tender. Cardiovascular system: S1 & S2 +, regular, No JVD. Gastrointestinal system: Abdomen soft, NT,ND, BS+. Nervous System:Alert, awake, no nystagmus, moving extremities and grossly nonfocal Extremities: No edema, distal peripheral pulses palpable.  Skin: No rashes,no  icterus. MSK: Normal muscle bulk,tone, power   Data Reviewed: I have personally reviewed following labs and imaging studies CBC: Recent Labs  Lab 01/13/21 0324 01/13/21 0334 01/13/21 0903 01/14/21 0553 01/15/21 0553  WBC 4.0  --  2.2* 3.6* 3.7*  NEUTROABS 1.4*  --   --  1.5*  --   HGB 14.4 15.0 10.1* 12.3 13.3  HCT 40.3 44.0 28.5* 35.3* 38.3  MCV 89.8  --  91.6 91.9 93.2  PLT 359  --  215 270 818   Basic Metabolic Panel: Recent Labs  Lab 01/13/21 0334 01/13/21 0903 01/13/21 1124 01/14/21 0553 01/15/21 0553  NA 127* 144 138 135 139  K 3.7 2.7* 3.6 3.5 3.0*  CL 91* 109 100 101 104  CO2  --  17* 21* 23 23  GLUCOSE 93 64* 78 98 93  BUN 8 6* 8 7* <5*  CREATININE 1.20* 0.30* 0.55 0.57 0.44  CALCIUM  --  5.9* 8.6* 8.5* 9.0  MG  --  1.6*  --  2.3 1.8  PHOS  --  2.6  --  3.6 3.2   GFR: Estimated Creatinine Clearance: 65.4 mL/min (by C-G formula based on SCr of 0.44 mg/dL). Liver Function Tests: Recent Labs  Lab 01/14/21 0553  AST 22  ALT 11  ALKPHOS 85  BILITOT 1.5*  PROT 6.1*  ALBUMIN 3.5   No results for input(s): LIPASE, AMYLASE in the last 168 hours. No results for input(s): AMMONIA in the last 168 hours. Coagulation Profile: No results for input(s): INR, PROTIME in the last 168 hours. Cardiac Enzymes: No results for input(s): CKTOTAL, CKMB, CKMBINDEX, TROPONINI in the last 168 hours. BNP (last 3 results) No results for input(s): PROBNP in the last 8760 hours. HbA1C: No results for input(s): HGBA1C in the last 72 hours. CBG: Recent Labs  Lab 01/14/21 0619 01/15/21 0517 01/15/21 0754  GLUCAP 102* 95 92   Lipid Profile: No results for input(s): CHOL, HDL, LDLCALC, TRIG, CHOLHDL, LDLDIRECT in the last 72 hours. Thyroid Function Tests: No results for input(s): TSH, T4TOTAL, FREET4, T3FREE, THYROIDAB in the last 72 hours. Anemia Panel: No results for input(s): VITAMINB12, FOLATE, FERRITIN, TIBC, IRON, RETICCTPCT in the last 72 hours. Sepsis  Labs: Recent Labs  Lab 01/13/21 [phone] 01/14/21 [phone]  01/14/21 1723 01/14/21 2144 01/15/21 0901  PROCALCITON  --  <0.10  --   --   --   LATICACIDVEN 3.5*  --  2.9* 4.9* 1.0    Recent Results (from the past 240 hour(s))  Blood culture (routine x 2)     Status: None (Preliminary result)   Collection Time: 01/13/21  5:30 AM   Specimen: Bronchoalveolar Lavage; Blood  Result Value Ref Range Status   Specimen Description   Final    BRONCHIAL ALVEOLAR LAVAGE RIGHT ANTECUBITAL Performed at [facility] [address] [place], [place] [address]    Special Requests   Final    BOTTLES DRAWN AEROBIC AND ANAEROBIC Blood Culture results may not be optimal due to an inadequate volume of blood received in culture bottles Performed at [facility] [address] [place], [place] [address]    Culture   Final    NO GROWTH 2 DAYS Performed at [facility] [address], [place], [place] [address]    Report Status PENDING  Incomplete  Blood culture (routine x 2)     Status: None (Preliminary result)   Collection Time: 01/13/21  5:45 AM   Specimen: Bronchoalveolar Lavage; Blood  Result Value Ref Range Status   Specimen Description   Final    BRONCHIAL ALVEOLAR LAVAGE RIGHT ANTECUBITAL Performed at [facility] [address], [place], [place] [address]    Special Requests   Final    BOTTLES DRAWN AEROBIC AND ANAEROBIC Blood Culture results may not be optimal due to an inadequate volume of blood received in culture bottles Performed at [facility] [address] [place],  [address]    Culture   Final    NO GROWTH 2 DAYS Performed at [facility] [address] [place],  [address]    Report Status PENDING  Incomplete  Resp Panel by RT-PCR (Flu A&B, Covid) Nasopharyngeal Swab     Status: None   Collection Time: 01/13/21  6:15 AM   Specimen: Nasopharyngeal Swab; Nasopharyngeal(NP)  swabs in vial transport medium  Result Value Ref Range Status   SARS Coronavirus 2 by RT PCR NEGATIVE NEGATIVE Final    Comment: (NOTE) SARS-CoV-2 target nucleic acids are NOT DETECTED.  The SARS-CoV-2 RNA is generally detectable in upper respiratory specimens during the acute phase of infection. The lowest concentration of SARS-CoV-2 viral copies this assay can detect is 138 copies/mL. A negative result does not preclude SARS-Cov-2 infection and should not be used as the sole basis for treatment or other patient management decisions. A negative result may occur with  improper specimen collection/handling, submission of specimen other than nasopharyngeal swab, presence of viral mutation(s) within the areas targeted by this assay, and inadequate number of viral copies(<138 copies/mL). A negative result must be combined with clinical observations, patient history, and epidemiological information. The expected result is Negative.  Fact Sheet for Patients:  EntrepreneurPulse.com.au  Fact Sheet for Healthcare Providers:  IncredibleEmployment.be  This test is no t yet approved or cleared by the [place] FDA and  has been authorized for detection and/or diagnosis of SARS-CoV-2 by FDA under an Emergency Use Authorization (EUA). This EUA will remain  in effect (meaning this test can be used) for the duration of the COVID-19 declaration under Section 564(b)(1) of the Act, 21 U.S.C.section 360bbb-3(b)(1), unless the authorization is terminated  or revoked sooner.       Influenza A by PCR NEGATIVE  NEGATIVE Final   Influenza B by PCR NEGATIVE NEGATIVE Final    Comment: (NOTE) The Xpert Xpress SARS-CoV-2/FLU/RSV plus assay is intended as an aid in the diagnosis of influenza from Nasopharyngeal swab specimens and should not be used as a sole basis for treatment. Nasal washings and aspirates are unacceptable for Xpert Xpress  SARS-CoV-2/FLU/RSV testing.  Fact Sheet for Patients: EntrepreneurPulse.com.au  Fact Sheet for Healthcare Providers: IncredibleEmployment.be  This test is not yet approved or cleared by the [place] FDA and has been authorized for detection and/or diagnosis of SARS-CoV-2 by FDA under an Emergency Use Authorization (EUA). This EUA will remain in effect (meaning this test can be used) for the duration of the COVID-19 declaration under Section 564(b)(1) of the Act, 21 U.S.C. section 360bbb-3(b)(1), unless the authorization is terminated or revoked.  Performed at [facility], [place] [address] [place], [place] [address]      Radiology Studies: No results found.   LOS: 0 days   Antonieta Pert, MD [facility]  01/15/2021, 1:44 PM

## 2021-01-15 NOTE — Significant Event (Signed)
Patient is seen this evening after she had episode of syncope for brief few seconds, this happened right after vomiting and bowel movement loose, had constipation earlier and had taken laxatives.No post ictal confusion.Nonfocal on exam. Orthostatics were obtained and she was in the chair and negative for rapid response nurse. Telemetry reviewed sinus rhythm no bradycardia tachycardia or any other arrhythmia. Could be vagal episode. Her blood pressure earlier was low 52/49  but was not rechecked, she was ordered half liter bolus normal saline. Discussed with neurologist WHO will be officially seeing IN AM, we will continue to monitor closely on telemetry.

## 2021-01-15 NOTE — Telephone Encounter (Signed)
Patients son called in to cancel patients POV appointment, currently in the hospital and may be discharged soon but not sure when, Please Advise

## 2021-01-15 NOTE — Treatment Plan (Signed)
Treatment Plan  Called about this patient to see if additional workup for recurrent syncope is warranted. She had similar episode back in December. She came in 2 days ago with syncope that occurred after BM. No postictal state. EEG negative 01/13/21. No seizure like activity. Prior workup included echocardiogram that was unremarkable.  Suggested 30-day cardiac event monitor, and CTA of the head and neck as reasonable next steps.  Kathy [name]

## 2021-01-15 NOTE — Significant Event (Signed)
Rapid Response Event Note   Reason for Call :  Nursing staff states patient went hypotensive, had vomiting, unresponsive episode  Initial Focused Assessment: Upon arrival patient was sitting up in chair, recently had an incontinent bowel episode. Primary nurse states that she had recently started IV potassium when patient got dizzy, minimally responsive, and vomited. During this episode, nurse cycled a blood pressure that read 52/49. States that she did not re-cycle pressure. Found patient alert and oriented in the chair. Endorses not feeling well, says she feels dizzy, but doesn't have any pain or nausea.  Interventions:  Rechecked blood pressure while seated 148/82 (102) w/ pulse 93. Assisted patient to stand with the help of primary nurse and two other sixth floor nurses. Blood pressure dropped to 128/93 (104) w/ pulse of 94. Assisted patient to lay flat in the bed, BP came back up to 138/76 (94). Suspect that patient may have vagaled, not sure that initial blood pressure nursing staff provided prior to rapid of 52/49 was entirely accurate considering patient's presentation. Checked blood sugar just to be sure she didn't have a hypoglycemic event, within normal limits at 98.  MD informed and officially consulted neurology who will see her tomorrow considering she is stable. Plan of Care:  Patient stable to stay on the floor. Instructed nursing staff to give rapid response a call back if she had true, sustained low blood pressures or she stayed unresponsive. Informed nurse that the likelihood of IV potassium is pretty slim, and that she's likely fine to replace the potassium if needed. MD made aware.  Event Summary:  MD Notified: [facility] Call Time: [place] Time: 1602 End Time: [place], RN

## 2021-01-15 NOTE — Progress Notes (Signed)
Physical Therapy Treatment Patient Details Name: Kathy [name] MRN: [id number] DOB: [birth year] Today's Date: 01/15/2021    History of Present Illness 70 year old female with past medical history of hypertension, osteoarthritis, fibromyalgia, diastolic congestive heart failure , pulmonary sarcoidosis,staatus post first MTP arthrodesis 1/19 presenting to [facility] via EMS 2* status post an episode of loss of consciousness, found hypo natremia.    PT Comments    The patient is resting in bed, reporting   Having had A BM. Assisted patient to ambulate using knee scooter requiring much  assistance for stabilization  And balance while mounting and dismounting right leg and turning with scooter.. Patient  Reports not having been up since PT visit on 2/14. Patient  Does demonsrate decreased balance with knee scooter. Per  her Podiatry, she is to maintain NWB except for transfers. Patient placing weight on the right foot multiple times in order to  utiilze the knee scooter.Patient my benefit from a WC for short term to mobilize in her home until F/U with Podiatry.   Follow Up Recommendations  No PT follow up     Equipment Recommendations  Wheelchair (measurements PT)    Recommendations for Other Services       Precautions / Restrictions Precautions Precautions: Fall Precaution Comments: CAM boot right, using Knee scooter at home Restrictions Weight Bearing Restrictions: Yes RLE Weight Bearing: Non weight bearing Other Position/Activity Restrictions: per Podiatry note 1/24 "NWB in CAM boot but also discussed that she can put her weight down for balance and transfers if needed"    Mobility  Bed Mobility                    Transfers                    Ambulation/Gait Ambulation/Gait assistance: Min assist Gait Distance (Feet): 15 Feet (x2) Assistive device:  (knee scooter.) Gait Pattern/deviations: Step-to pattern Gait velocity: decr   General Gait Details: patient  required steady assist to manage knee scooter to safely mount and dismount, turn corners and turn around.. Patient having to place right foot  on floor an bearing weight to mange. Patient has been slepping in power recliner, not bed.   Stairs             Wheelchair Mobility    Modified Rankin (Stroke Patients Only)       Balance Overall balance assessment: History of Falls;Needs assistance   Sitting balance-Leahy Scale: Normal     Standing balance support: During functional activity;Bilateral upper extremity supported Standing balance-Leahy Scale: Fair Standing balance comment: on knee scooter                            Cognition Arousal/Alertness: Awake/alert Behavior During Therapy: WFL for tasks assessed/performed Overall Cognitive Status: Within Functional Limits for tasks assessed                                        Exercises      General Comments        Pertinent Vitals/Pain Pain Assessment: No/denies pain    Home Living                      Prior Function            PT Goals (current goals can now be found  in the care plan section) Progress towards PT goals: Progressing toward goals    Frequency    Min 3X/week      PT Plan Current plan remains appropriate    Co-evaluation              AM-PAC PT "6 Clicks" Mobility   Outcome Measure  Help needed turning from your back to your side while in a flat bed without using bedrails?: None Help needed moving from lying on your back to sitting on the side of a flat bed without using bedrails?: None Help needed moving to and from a bed to a chair (including a wheelchair)?: A Little Help needed standing up from a chair using your arms (e.g., wheelchair or bedside chair)?: A Little Help needed to walk in hospital room?: A Lot Help needed climbing 3-5 steps with a railing? : A Lot 6 Click Score: 18    End of Session Equipment Utilized During Treatment:  Gait belt Activity Tolerance: Patient tolerated treatment well Patient left: in chair;with call bell/phone within reach;with chair alarm set Nurse Communication: Mobility status PT Visit Diagnosis: Unsteadiness on feet (R26.81);Difficulty in walking, not elsewhere classified (R26.2)     Time: 1594-5859 PT Time Calculation (min) (ACUTE ONLY): 35 min  Charges:  $Gait Training: 23-37 mins                    [place] Pager [phone] Office [phone]    Claretha Cooper 01/15/2021, 3:39 PM

## 2021-01-16 ENCOUNTER — Encounter: Payer: Medicare Other | Admitting: Podiatry

## 2021-01-16 DIAGNOSIS — I5032 Chronic diastolic (congestive) heart failure: Secondary | ICD-10-CM | POA: Diagnosis present

## 2021-01-16 DIAGNOSIS — R55 Syncope and collapse: Secondary | ICD-10-CM | POA: Diagnosis present

## 2021-01-16 DIAGNOSIS — E871 Hypo-osmolality and hyponatremia: Secondary | ICD-10-CM | POA: Diagnosis present

## 2021-01-16 DIAGNOSIS — M797 Fibromyalgia: Secondary | ICD-10-CM | POA: Diagnosis present

## 2021-01-16 DIAGNOSIS — D860 Sarcoidosis of lung: Secondary | ICD-10-CM | POA: Diagnosis present

## 2021-01-16 DIAGNOSIS — G629 Polyneuropathy, unspecified: Secondary | ICD-10-CM | POA: Diagnosis present

## 2021-01-16 DIAGNOSIS — Z8249 Family history of ischemic heart disease and other diseases of the circulatory system: Secondary | ICD-10-CM | POA: Diagnosis not present

## 2021-01-16 DIAGNOSIS — T502X5A Adverse effect of carbonic-anhydrase inhibitors, benzothiadiazides and other diuretics, initial encounter: Secondary | ICD-10-CM | POA: Diagnosis present

## 2021-01-16 DIAGNOSIS — E876 Hypokalemia: Secondary | ICD-10-CM | POA: Diagnosis present

## 2021-01-16 DIAGNOSIS — Z20822 Contact with and (suspected) exposure to covid-19: Secondary | ICD-10-CM | POA: Diagnosis present

## 2021-01-16 DIAGNOSIS — E860 Dehydration: Secondary | ICD-10-CM | POA: Diagnosis present

## 2021-01-16 DIAGNOSIS — J189 Pneumonia, unspecified organism: Secondary | ICD-10-CM | POA: Diagnosis present

## 2021-01-16 DIAGNOSIS — K5900 Constipation, unspecified: Secondary | ICD-10-CM | POA: Diagnosis present

## 2021-01-16 DIAGNOSIS — E861 Hypovolemia: Secondary | ICD-10-CM | POA: Diagnosis present

## 2021-01-16 DIAGNOSIS — Y92009 Unspecified place in unspecified non-institutional (private) residence as the place of occurrence of the external cause: Secondary | ICD-10-CM | POA: Diagnosis not present

## 2021-01-16 DIAGNOSIS — Z66 Do not resuscitate: Secondary | ICD-10-CM | POA: Diagnosis present

## 2021-01-16 DIAGNOSIS — I959 Hypotension, unspecified: Secondary | ICD-10-CM | POA: Diagnosis present

## 2021-01-16 DIAGNOSIS — R4020 Unspecified coma: Secondary | ICD-10-CM | POA: Diagnosis not present

## 2021-01-16 DIAGNOSIS — M2021 Hallux rigidus, right foot: Secondary | ICD-10-CM | POA: Diagnosis present

## 2021-01-16 DIAGNOSIS — Z6831 Body mass index (BMI) 31.0-31.9, adult: Secondary | ICD-10-CM | POA: Diagnosis not present

## 2021-01-16 DIAGNOSIS — Z833 Family history of diabetes mellitus: Secondary | ICD-10-CM | POA: Diagnosis not present

## 2021-01-16 DIAGNOSIS — R630 Anorexia: Secondary | ICD-10-CM | POA: Diagnosis present

## 2021-01-16 DIAGNOSIS — I110 Hypertensive heart disease with heart failure: Secondary | ICD-10-CM | POA: Diagnosis present

## 2021-01-16 DIAGNOSIS — E872 Acidosis: Secondary | ICD-10-CM | POA: Diagnosis present

## 2021-01-16 DIAGNOSIS — E538 Deficiency of other specified B group vitamins: Secondary | ICD-10-CM | POA: Diagnosis present

## 2021-01-16 LAB — CORTISOL-AM, BLOOD: Cortisol - AM: 8.7 ug/dL (ref 6.7–22.6)

## 2021-01-16 LAB — RPR: RPR Ser Ql: NONREACTIVE

## 2021-01-16 LAB — BASIC METABOLIC PANEL
Anion gap: 10 (ref 5–15)
BUN: 5 mg/dL — ABNORMAL LOW (ref 8–23)
CO2: 23 mmol/L (ref 22–32)
Calcium: 9.4 mg/dL (ref 8.9–10.3)
Chloride: 106 mmol/L (ref 98–111)
Creatinine, Ser: 0.54 mg/dL (ref 0.44–1.00)
GFR, Estimated: >60 mL/min (ref >60)
Glucose, Bld: 94 mg/dL (ref 70–99)
Potassium: 3.8 mmol/L (ref 3.5–5.1)
Sodium: 139 mmol/L (ref 135–145)

## 2021-01-16 LAB — FOLATE: Folate: 4.7 ng/mL — ABNORMAL LOW (ref >5.9)

## 2021-01-16 LAB — GLUCOSE, CAPILLARY: Glucose-Capillary: 99 mg/dL (ref 70–99)

## 2021-01-16 LAB — VITAMIN B12: Vitamin B-12: 740 pg/mL (ref 180–914)

## 2021-01-16 MED ORDER — SODIUM CHLORIDE 0.9 % IV SOLN
1.0000 mg | Freq: Every day | INTRAVENOUS | Status: DC
  Administered 2021-01-16 – 2021-01-17 (×2): 1 mg via INTRAVENOUS
  Filled 2021-01-16 (×2): qty 0.2

## 2021-01-16 MED ORDER — SIMETHICONE 80 MG PO CHEW
80.0000 mg | CHEWABLE_TABLET | Freq: Four times a day (QID) | ORAL | Status: DC | PRN
  Administered 2021-01-16: 80 mg via ORAL
  Filled 2021-01-16: qty 1

## 2021-01-16 MED ORDER — COSYNTROPIN 0.25 MG IJ SOLR
0.2500 mg | Freq: Once | INTRAMUSCULAR | Status: AC
  Administered 2021-01-17: 0.25 mg via INTRAVENOUS
  Filled 2021-01-16: qty 0.25

## 2021-01-16 MED ORDER — TUBERCULIN PPD 5 UNIT/0.1ML ID SOLN
5.0000 [IU] | Freq: Once | INTRADERMAL | Status: AC
  Administered 2021-01-16: 5 [IU] via INTRADERMAL
  Filled 2021-01-16: qty 0.1

## 2021-01-16 NOTE — Consult Note (Signed)
Neurology Consultation Reason for Consult: syncope Referring Physician: Kc  CC: passing out  History is obtained from: the patient and chart.  HPI: Kathy [name] is a 70 y.o. female who started having syncopal spells back in December. She has had several of them. They are most often preceded by dizziness/faintness. There is no post-ictal confusion. She has provoking factors such as diarrhea, or standing up. Coughing or sneezing may also do it, she says. Echocardiogram and CTA of the head and neck were unrevealing. Her BP was checked yesterday and found to be 52/49 soon after a spell. She feels better after saline administration. Orthostatic BP was negative, going from 115/67 supine to 105/68 standing (10 point drop) with a pulse elevation from 72 to 85. Additional measurements showed a pulse rise of 27 beats (100 to 127), but no drop in BP. She has had some palpitations, but doesn't relate these closely to the spells. No convulsive activity or incontinence has been present. Of interest, she has peripheral neuropathy. She's numb in the hands and feet, and has a sister with neuropathy and falls, too.  ROS: A 14 point ROS was performed and is negative except as noted in the HPI.   Past Medical History:  Diagnosis Date  . Chronic diastolic CHF (congestive heart failure) ([facility]) 01/13/2021  . Exotropia of left eye   . Fibromyalgia   . History of uterine leiomyoma   . Hypertension   . Macular degeneration   . OA (osteoarthritis)   . Overactive bladder   . Sarcoidosis   . Wears glasses     Family History  Problem Relation Age of Onset  . Colon cancer Mother   . Diabetes Father   . Diabetes Sister   . Hypertension Sister   . Diabetes Sister   . Hypertension Sister   . Hypertension Sister     Social History:  reports that she has never smoked. She has never used smokeless tobacco. She reports current alcohol use of about 1.0 standard drink of alcohol per week. She reports that she does  not use drugs.  Exam: Current vital signs: BP 118/69 (BP Location: Left Arm)   Pulse 88   Temp 97.9 F (36.6 C) (Oral)   Resp 14   Ht 5\' 3"  (1.6 m)   Wt 77.3 kg   SpO2 95%   BMI 30.19 kg/m  Vital signs in last 24 hours: Temp:  [97.4 F (36.3 C)-98.9 F (37.2 C)] 97.9 F (36.6 C) (02/17 0546) Pulse Rate:  [79-89] 88 (02/17 0546) Resp:  [14-22] 14 (02/17 0546) BP: (112-148)/(63-93) 118/69 (02/17 0546) SpO2:  [95 %-100 %] 95 % (02/17 0546)   Physical Exam  Constitutional: Appears well-developed and well-nourished.  Psych: Affect appropriate to situation Eyes: No scleral injection HENT: No OP obstrucion MSK: no joint deformities.  Cardiovascular: Normal rate and regular rhythm.  Respiratory: Effort normal, non-labored breathing GI: Soft.  No distension. There is no tenderness.  Skin: WDI  Neuro: Mental Status: Patient is awake, alert, oriented to person, place, month, year, and situation. Patient is able to give a clear and coherent history. No signs of aphasia or neglect. Cranial Nerves: II: Visual Fields not assessed. Pupils are equal, round, and reactive to light.  III,IV, VI: EOMI without ptosis or diploplia.  V: Facial sensation is symmetric to temperature VII: Facial movement is symmetric.  VIII: hearing is intact to voice X: Uvula elevates symmetrically XI: Shoulder shrug is symmetric. XII: tongue is midline without atrophy or fasciculations.  Motor: Tone is normal. Bulk is normal. 5/5 strength was present in all four extremities.  Sensory: Sensation is symmetrically reduced to light touch, vibration and temperature in the arms and legs in a stocking and glove pattern. Deep Tendon Reflexes: Trace to 1+ and symmetric in the biceps,  But absent elsewhere. Plantars: Right foot wrapped from recent bunion surgery; not assessed. Cerebellar: FNF and HKS are intact bilaterally.  I have reviewed labs in epic and the results pertinent to this consultation  are: Results for AUNE, ADAMI (MRN [id number]) as of 01/16/2021 06:52  Ref. Range 01/16/2021 05:20  Sodium Latest Ref Range: 135 - 145 mmol/L 139  Potassium Latest Ref Range: 3.5 - 5.1 mmol/L 3.8  Chloride Latest Ref Range: 98 - 111 mmol/L 106  CO2 Latest Ref Range: 22 - 32 mmol/L 23  Glucose Latest Ref Range: 70 - 99 mg/dL 94  BUN Latest Ref Range: 8 - 23 mg/dL 5 (L)  Creatinine Latest Ref Range: 0.44 - 1.00 mg/dL 0.54  Calcium Latest Ref Range: 8.9 - 10.3 mg/dL 9.4  Anion gap Latest Ref Range: 5 - 15  10  GFR, Estimated Latest Ref Range: >60 mL/min >60   I have reviewed the images obtained:  Echocardiogram:  IMPRESSIONS     1. Left ventricular ejection fraction, by estimation, is 55 to 60%. The  left ventricle has normal function. The left ventricle has no regional  wall motion abnormalities. Left ventricular diastolic parameters are  consistent with Grade I diastolic  dysfunction (impaired relaxation). Elevated left ventricular end-diastolic  pressure. The average left ventricular global longitudinal strain is -19.9  %. The global longitudinal strain is normal.   2. Right ventricular systolic function is normal. The right ventricular  size is normal. There is normal pulmonary artery systolic pressure.   3. The mitral valve is normal in structure. Trivial mitral valve  regurgitation. No evidence of mitral stenosis.   4. The aortic valve is tricuspid. There is mild calcification of the  aortic valve. There is mild thickening of the aortic valve. Aortic valve  regurgitation is not visualized. No aortic stenosis is present.   5. The inferior vena cava is normal in size with greater than 50%  respiratory variability, suggesting right atrial pressure of 3 mmHg.   EEG:  This study is within normal limits. No seizures or epileptiform discharges were seen throughout the recording.  CT neck:  1. No acute intracranial abnormality. 2. No acute displaced fracture or traumatic  listhesis of the cervical spine.  CTA chest:  1. Right upper lobe scarring with suggestion of superimposed infection/inflammation. Followup PA and lateral chest X-ray is recommended in 3-4 weeks following therapy to ensure resolution and exclude underlying malignancy. 2. No central or segmental pulmonary embolus. Limited evaluation more distally due to artifact. 3. Mediastinal and bilateral hilar calcified lymph nodes consistent with sarcoidosis.  CTA head and neck:  1. No intracranial arterial occlusion or high-grade stenosis. 2. Moderate right apical peribronchial opacities, concerning for pneumonia or bronchitis.  Impression:  - probable vasovagal syncope - peripheral neuropathy with possible familial basis (could be CMT subtype, HSAN) - no conclusive evidence of cardiac outflow obstruction, HFrEF, or vertebrobasilar/subclavian stenosis - consider POTS syndrome (postural orthostatic tachycardia syndrome) - consider volume depletion, adrenal insufficiency - doubt pure autonomic failure given peripheral neuropathy  Recommendations: 1) generous volume repletion 2) workup for treatable causes of peripheral neuropathy (will enter orders), including monoclonal gammopathy, autoimmune, sarcoid can even cause neuropathy, toxic/metabolic, infectious, etc. 3) outpatient  neurology follow up on lab results and to consider further diagnostic testing (PNCV/EMG, nerve biopsy) if symptoms persist 4) if the tachycardia persists after generous volume repletion, suggest cardiology evaluation for tilt table testing, which may help to confirm POTS and identify tendency to vasovagal or neurocardiogenic syncope even in the absence of severe orthostasis 5) cortisol level this am 6) sometimes a small dose of beta blocker can reduce tendency to vasovagal syncope (e.g., 25 mg/d atenolol).  Thank you. Will be available as needed for further questions.  Minette Brine Chestine Belknap

## 2021-01-16 NOTE — Progress Notes (Signed)
Physical Therapy Treatment Patient Details Name: Kathy [name] MRN: [id number] DOB: [birth year] Today's Date: 01/16/2021    History of Present Illness 70 year old female with past medical history of hypertension, osteoarthritis, fibromyalgia, diastolic congestive heart failure , pulmonary sarcoidosis,staatus post first MTP arthrodesis 1/19 presenting to [facility] via EMS 2* status post an episode of loss of consciousness, found hypo natremia.    PT Comments    Pt agreeable to therapeutic exercises, able to complete 20 reps before requiring rest break. Pt requests to use restroom requiring assist with knee scoot management. Therapist positions knee scooter appropriately, min A to mount and dismount safely as well as maneuver around room and into restroom. Pt cued for RLE positioning on knee scooter, hand placement and WB status with questionable WB through RLE with transfers. Once return to EOB, pt fatigued and requesting to nap. Pt denies pain throughout session. Will continue to progress acute PT services as able.  Follow Up Recommendations  No PT follow up     Equipment Recommendations  Wheelchair (measurements PT)    Recommendations for Other Services       Precautions / Restrictions Precautions Precautions: Fall Precaution Comments: CAM boot right, using Knee scooter at home; multiple syncopal episodes. Restrictions Weight Bearing Restrictions: Yes RLE Weight Bearing: Non weight bearing Other Position/Activity Restrictions: per Podiatry note 1/24 "NWB in CAM boot but also discussed that she can put her weight down for balance and transfers if needed"    Mobility  Bed Mobility Overal bed mobility: Modified Independent  General bed mobility comments: slightly increased time    Transfers Overall transfer level: Needs assistance Equipment used:  (knee scooter) Transfers: Sit to/from Stand Sit to Stand: Min guard  General transfer comment: min G to power up rom EOB with CAM boot  on and knee scooter parked in position, cued for NWB status with questionable amount during transfer  Ambulation/Gait Ambulation/Gait assistance: Min assist Gait Distance (Feet): 10 Feet (x2) Assistive device:  (knee scooter) Gait Pattern/deviations: Step-to pattern Gait velocity: decreased   General Gait Details: step to pattern with knee scooter, min A to steady while on, mount and dismount, as well as maneuver in tight spaces   Stairs             Wheelchair Mobility    Modified Rankin (Stroke Patients Only)       Balance Overall balance assessment: History of Falls;Needs assistance   Sitting balance-Leahy Scale: Good Sitting balance - Comments: sitting EOB   Standing balance support: During functional activity;Bilateral upper extremity supported Standing balance-Leahy Scale: Poor Standing balance comment: UE support and knee scooter       Cognition Arousal/Alertness: Awake/alert Behavior During Therapy: WFL for tasks assessed/performed Overall Cognitive Status: Within Functional Limits for tasks assessed       Exercises General Exercises - Lower Extremity Long Arc Quad: Seated;AROM;Strengthening;Both;20 reps Hip Flexion/Marching: Seated;AROM;Strengthening;Both;20 reps    General Comments        Pertinent Vitals/Pain Pain Assessment: No/denies pain    Home Living Family/patient expects to be discharged to:: Private residence Living Arrangements: Children Available Help at Discharge: Family Type of Home: House Home Access: Stairs to enter Entrance Stairs-Rails: Right Home Layout: One level Home Equipment: Environmental consultant - 2 wheels;Bedside commode;Other (comment) Additional Comments: knee scooter, getting HH POT    Prior Function Level of Independence: Independent with assistive device(s)      Comments: with knee scooter   PT Goals (current goals can now be found in the care plan section)  Acute Rehab PT Goals Patient Stated Goal: to go home PT Goal  Formulation: With patient Time For Goal Achievement: 01/27/21 Potential to Achieve Goals: Good Progress towards PT goals: Progressing toward goals    Frequency    Min 3X/week      PT Plan Current plan remains appropriate    Co-evaluation              AM-PAC PT "6 Clicks" Mobility   Outcome Measure  Help needed turning from your back to your side while in a flat bed without using bedrails?: None Help needed moving from lying on your back to sitting on the side of a flat bed without using bedrails?: None Help needed moving to and from a bed to a chair (including a wheelchair)?: A Little Help needed standing up from a chair using your arms (e.g., wheelchair or bedside chair)?: A Little Help needed to walk in hospital room?: A Little Help needed climbing 3-5 steps with a railing? : Total 6 Click Score: 18    End of Session Equipment Utilized During Treatment: Gait belt Activity Tolerance: Patient tolerated treatment well Patient left: in bed;with call bell/phone within reach Nurse Communication: Mobility status PT Visit Diagnosis: Unsteadiness on feet (R26.81);Difficulty in walking, not elsewhere classified (R26.2)     Time: 4742-5956 PT Time Calculation (min) (ACUTE ONLY): 16 min  Charges:  $Therapeutic Activity: 8-22 mins                      Tori Remi Rester PT, DPT 01/16/21, 2:32 PM

## 2021-01-16 NOTE — Progress Notes (Signed)
PROGRESS NOTE    Kathy [name]  BHA:[id number] DOB: February 27, 1951 DOA: 01/13/2021 PCP: Denita Lung, MD   Chief Complaint  Patient presents with  . Loss of Consciousness  . Fall   Brief Narrative: 44yof w/ past medical history of hypertension, osteoarthritis, fibromyalgia, chronic diastolic CHF Echo 97/35 EF 55 to 60%, pulmonary sarcoidosis, hallux rigidus of first toe s/p first MTP arthrodesis presented to the ED after episode of syncope while getting up and coming out from her bathroom.  She was also recently admitted from 12/23 through 12/28 after a syncopal episode-following recent antibiotic for UTI after which she had couple days of nausea vomiting abdominal pain and diarrhea subsequently leading to episode of syncope at home while getting up to go to the bathroom-suspected to be due to vasovagal episode/orthostatic hypotension had negative work-up with echocardiogram, CT head, was also treated for nausea abdominal pain and colitis. She presented to the ED after episode of syncope 2/15 on admission CT head CT C-spine unremarkable, UA negative CT chest negative for PE but area of right upper lobe with scarring and superimposed infectious or inflammatory process concerning for pneumonia, start on ceftriaxone azithromycin for community-acquired pneumonia and was admitted. Patient underwent further work-up-negative orthostatic vitals, EEG no acute finding. 2/16 evening- rapid response called-she had episode of syncope for brief few seconds and was examined urgently at bedside, this happened right after vomiting and loose bowel movement,had constipation earlier and had taken laxatives.No post ictal confusion.Nonfocal on exam. Orthostatics were obtained and she was in the chair and negative for rapid response nurse. Telemetry reviewed sinus rhythm no bradycardia tachycardia or any other arrhythmia. ? Vagal episode r/o other etiology.Her blood pressure earlier was low 52/49  but was not rechecked,  she was ordered half liter bolus normal saline  Subjective: Seen this morning mild headache otherwise feels better today   Assessment & Plan:  Syncopal episode: recurrent, had similar episode in Dec 3032.EEG no seizure,CT head, CT C-spine no acute finding this time too. Episode happened after getting up from the bathroom question orthostatic but negative. BP stable and amlodipine and ARBS stopped. In December syncope was in the setting of diarrhea/colitis back, TTE was okay. Tele stable here.  Again had a recurrent episode in hospital 2/16-was nonfocal, no postictal symptoms, tele NSR,and syncope happened after diarrhea and vomiting episode. Neurology eval appreciated this morning.  She has been hydrated and she seems to be feeling better, will continue on IV fluid hydration for another day, monitor on telemetry.  She is set up for 30-day event monitoring.  Probable vasovagal syncope.  Question peripheral neuropathy with possible familial basis-work-up started per neurology for treatable causes of peripheral neuropathy, will need outpatient follow-up with neurology.  If tachycardia persist after volume repletion can consider cardiology evaluation for tilt table, beta-blocker trial but will hold off on meds.  Work-up came back folic acid low-replete IV, cortisol not elevated despite acute illness, we will do cosyntropin test in am.  Folate deficiency we will start on folate replacement IV folic acid.  Lactic acidosis as high as 4.9 suspect in the setting of dehydration.  Resolved.  Hypokalemia repleted,resolved.  Hyponatremia likely in the setting of hydrochlorothiazide/dehydration.  Resolved.   Community-acquired pneumonia right upper lobe:  Cont on ceftriaxone/azithromycin, wbc w/ leucopenia. Recent Labs  Lab 01/13/21 0324 01/13/21 0647 01/13/21 0903 01/14/21 0553 01/14/21 1723 01/14/21 2144 01/15/21 0553 01/15/21 0901  WBC 4.0  --  2.2* 3.6*  --   --  3.7*  --  LATICACIDVEN  --  3.5*   --   --  2.9* 4.9*  --  1.0  PROCALCITON  --   --   --  <0.10  --   --   --   --    Pulmonary sarcoidosis history outpatient follow-up Essential hypertension: Blood pressure remains stable despite holding off on amlodipine/ARB/ HCTZ-given patient's recurrent syncope.  Chronic diastolic CHF history EF 50 to 32% grade 1 diastolic dysfunction. Stable.  Hallux rigidus of right foot: Status post first MTP arthrodesis 1/19.  Not able to bear weight on right foot.  She has outpatient follow-up with her surgeon  Morbid obesity BMI more than 30 outpatient follow-up  Nutrition: Diet Order            Diet 2 gram sodium Room service appropriate? Yes; Fluid consistency: Thin  Diet effective now               Pt's Body mass index is 30.19 kg/m. DVT prophylaxis: enoxaparin (LOVENOX) injection 40 mg Start: 01/13/21 1000 Code Status:   Code Status: DNR Family Communication: plan of care discussed with patient at bedside.  Status is: ADMITTED AS Observation  Patient continues to require hospitalization for ongoing management of her pneumonia as well as recurrent syncope, episode of hypotension needing further work-up,   Dispo: The patient is from: Home              Anticipated d/c is to: Home              Anticipated d/c date is: 1 day              Patient currently is not medically stable to d/c.   Difficult to place patient No Consultants:see note  Procedures:see note  Unresulted Labs (From admission, onward)          Start     Ordered   01/16/21 0733  Angiotensin converting enzyme  Once,   R       Question:  Specimen collection method  Answer:  Lab=Lab collect   01/16/21 0734   01/16/21 0732  Protein electrophoresis, serum  Once,   R       Question:  Specimen collection method  Answer:  Lab=Lab collect   01/16/21 0734   01/16/21 0732  UPEP/UIFE/Light Chains/TP, 24-Hr Ur  Once,   R        01/16/21 0734   01/16/21 0732  ANA w/Reflex if Positive  Once,   R       Question:  Specimen  collection method  Answer:  Lab=Lab collect   01/16/21 0734   01/16/21 0732  Rheumatoid factor  Once,   R       Question:  Specimen collection method  Answer:  Lab=Lab collect   01/16/21 0734   01/16/21 0732  Vitamin B1  Once,   R       Question:  Specimen collection method  Answer:  Lab=Lab collect   01/16/21 0734   01/16/21 2025  Basic metabolic panel  Daily,   R     Question:  Specimen collection method  Answer:  Lab=Lab collect   01/15/21 1418          Culture/Microbiology    Component Value Date/Time   SDES  01/13/2021 0545    BRONCHIAL ALVEOLAR LAVAGE RIGHT ANTECUBITAL Performed at [facility], [place] [address], [place], [place] [address]    SPECREQUEST  01/13/2021 0545    BOTTLES DRAWN AEROBIC AND ANAEROBIC Blood  Culture results may not be optimal due to an inadequate volume of blood received in culture bottles Performed at [place] [address], [place], [place] [address]    CULT  01/13/2021 0545    NO GROWTH 3 DAYS Performed at [facility], [place] [address], [place], [place] [address]    REPTSTATUS PENDING 01/13/2021 0545    Other culture-see note  Medications: Scheduled Meds: . azithromycin  500 mg Oral Daily  . cycloSPORINE  1 drop Both Eyes BID  . enoxaparin (LOVENOX) injection  40 mg Subcutaneous Q24H  . melatonin  5 mg Oral QHS  . sodium chloride flush  3 mL Intravenous Q12H  . tuberculin  5 Units Intradermal Once   Continuous Infusions: . sodium chloride 100 mL/hr at 01/16/21 0800  . cefTRIAXone (ROCEPHIN)  IV Stopped (01/15/21 2151)    Antimicrobials: Anti-infectives (From admission, onward)   Start     Dose/Rate Route Frequency Ordered Stop   01/15/21 1000  azithromycin (ZITHROMAX) tablet 500 mg        500 mg Oral Daily 01/14/21 1108 01/18/21 0959   01/13/21 1800  cefTRIAXone (ROCEPHIN) 2 g in sodium chloride 0.9 % 100 mL IVPB        2 g 200 mL/hr over 30 Minutes Intravenous Every 24 hours  01/13/21 0641 01/18/21 1759   01/13/21 0800  azithromycin (ZITHROMAX) 500 mg in sodium chloride 0.9 % 250 mL IVPB  Status:  Discontinued        500 mg 250 mL/hr over 60 Minutes Intravenous Every 24 hours 01/13/21 0641 01/14/21 1108   01/13/21 0545  vancomycin (VANCOCIN) IVPB 1000 mg/200 mL premix  Status:  Discontinued        1,000 mg 200 mL/hr over 60 Minutes Intravenous  Once 01/13/21 0536 01/13/21 0701   01/13/21 0545  ceFEPIme (MAXIPIME) 2 g in sodium chloride 0.9 % 100 mL IVPB        2 g 200 mL/hr over 30 Minutes Intravenous STAT 01/13/21 0536 01/13/21 0620     Objective: Vitals: Today's Vitals   01/16/21 0829 01/16/21 0850 01/16/21 1022 01/16/21 1221  BP:  111/85  121/76  Pulse:  81  88  Resp:  16  16  Temp:  98.4 F (36.9 C)  97.7 F (36.5 C)  TempSrc:  Oral  Oral  SpO2:  100%  100%  Weight:      Height:      PainSc: 0-No pain 0-No pain 5      Intake/Output Summary (Last 24 hours) at 01/16/2021 1405 Last data filed at 01/16/2021 1005 Gross per 24 hour  Intake 1395.96 ml  Output 1 ml  Net 1394.96 ml   Filed Weights   01/13/21 0324 01/14/21 0500 01/15/21 0515  Weight: 77.1 kg 79.7 kg 77.3 kg   Weight change:   Intake/Output from previous day: 02/16 0701 - 02/17 0700 In: 6606 [P.O.:720; I.V.:576; IV Piggyback:100] Out: 901 [Urine:900; Stool:1] Intake/Output this shift: Total I/O In: 240 [P.O.:240] Out: -  Filed Weights   01/13/21 0324 01/14/21 0500 01/15/21 0515  Weight: 77.1 kg 79.7 kg 77.3 kg    Examination: General exam: AAOx3, NAD, on RA HEENT:Oral mucosa moist, Ear/Nose WNL grossly, dentition normal. Respiratory system: bilaterally CLEAR,no wheezing or crackles,no use of accessory muscle Cardiovascular system: S1 & S2 +, No JVD,. Gastrointestinal system: Abdomen soft, NT,ND, BS+ Nervous System:Alert, awake, moving extremities and grossly nonfocal Extremities: No edema, distal peripheral pulses palpable.  Skin: No rashes,no icterus.  MSK: Normal  muscle bulk,tone, power.  Data Reviewed: I have personally reviewed following labs and imaging studies CBC: Recent Labs  Lab 01/13/21 0324 01/13/21 0334 01/13/21 0903 01/14/21 0553 01/15/21 0553  WBC 4.0  --  2.2* 3.6* 3.7*  NEUTROABS 1.4*  --   --  1.5*  --   HGB 14.4 15.0 10.1* 12.3 13.3  HCT 40.3 44.0 28.5* 35.3* 38.3  MCV 89.8  --  91.6 91.9 93.2  PLT 359  --  215 270 967   Basic Metabolic Panel: Recent Labs  Lab 01/13/21 0903 01/13/21 1124 01/14/21 0553 01/15/21 0553 01/16/21 0520  NA 144 138 135 139 139  K 2.7* 3.6 3.5 3.0* 3.8  CL 109 100 101 104 106  CO2 17* 21* 23 23 23   GLUCOSE 64* 78 98 93 94  BUN 6* 8 7* <5* 5*  CREATININE 0.30* 0.55 0.57 0.44 0.54  CALCIUM 5.9* 8.6* 8.5* 9.0 9.4  MG 1.6*  --  2.3 1.8  --   PHOS 2.6  --  3.6 3.2  --    GFR: Estimated Creatinine Clearance: 65.4 mL/min (by C-G formula based on SCr of 0.54 mg/dL). Liver Function Tests: Recent Labs  Lab 01/14/21 0553  AST 22  ALT 11  ALKPHOS 85  BILITOT 1.5*  PROT 6.1*  ALBUMIN 3.5   No results for input(s): LIPASE, AMYLASE in the last 168 hours. No results for input(s): AMMONIA in the last 168 hours. Coagulation Profile: No results for input(s): INR, PROTIME in the last 168 hours. Cardiac Enzymes: No results for input(s): CKTOTAL, CKMB, CKMBINDEX, TROPONINI in the last 168 hours. BNP (last 3 results) No results for input(s): PROBNP in the last 8760 hours. HbA1C: No results for input(s): HGBA1C in the last 72 hours. CBG: Recent Labs  Lab 01/14/21 0619 01/15/21 0517 01/15/21 0754 01/15/21 1623 01/16/21 0548  GLUCAP 102* 95 92 98 99   Lipid Profile: No results for input(s): CHOL, HDL, LDLCALC, TRIG, CHOLHDL, LDLDIRECT in the last 72 hours. Thyroid Function Tests: No results for input(s): TSH, T4TOTAL, FREET4, T3FREE, THYROIDAB in the last 72 hours. Anemia Panel: Recent Labs    01/16/21 0832  VITAMINB12 740  FOLATE 4.7*   Sepsis Labs: Recent Labs  Lab  01/13/21 0647 01/14/21 0553 01/14/21 1723 01/14/21 2144 01/15/21 0901  PROCALCITON  --  <0.10  --   --   --   LATICACIDVEN 3.5*  --  2.9* 4.9* 1.0    Recent Results (from the past 240 hour(s))  Blood culture (routine x 2)     Status: None (Preliminary result)   Collection Time: 01/13/21  5:30 AM   Specimen: Bronchoalveolar Lavage; Blood  Result Value Ref Range Status   Specimen Description   Final    BRONCHIAL ALVEOLAR LAVAGE RIGHT ANTECUBITAL Performed at [facility], [place] [address], [place], [place] [address]    Special Requests   Final    BOTTLES DRAWN AEROBIC AND ANAEROBIC Blood Culture results may not be optimal due to an inadequate volume of blood received in culture bottles Performed at [place] [address], [place], [place] [address]    Culture   Final    NO GROWTH 3 DAYS Performed at [facility], [place] [address], [place], [place] [address]    Report Status PENDING  Incomplete  Blood culture (routine x 2)     Status: None (Preliminary result)   Collection Time: 01/13/21  5:45 AM   Specimen: Bronchoalveolar  Lavage; Blood  Result Value Ref Range Status   Specimen Description   Final    BRONCHIAL ALVEOLAR LAVAGE RIGHT ANTECUBITAL Performed at [place] [address] [place], [place] [address]    Special Requests   Final    BOTTLES DRAWN AEROBIC AND ANAEROBIC Blood Culture results may not be optimal due to an inadequate volume of blood received in culture bottles Performed at [place] [address], [place], [place] [address]    Culture   Final    NO GROWTH 3 DAYS Performed at [facility], [place] [address], [place], [place] [address]    Report Status PENDING  Incomplete  Resp Panel by RT-PCR (Flu A&B, Covid) Nasopharyngeal Swab     Status: None   Collection Time: 01/13/21  6:15 AM   Specimen: Nasopharyngeal Swab; Nasopharyngeal(NP) swabs in vial transport  medium  Result Value Ref Range Status   SARS Coronavirus 2 by RT PCR NEGATIVE NEGATIVE Final    Comment: (NOTE) SARS-CoV-2 target nucleic acids are NOT DETECTED.  The SARS-CoV-2 RNA is generally detectable in upper respiratory specimens during the acute phase of infection. The lowest concentration of SARS-CoV-2 viral copies this assay can detect is 138 copies/mL. A negative result does not preclude SARS-Cov-2 infection and should not be used as the sole basis for treatment or other patient management decisions. A negative result may occur with  improper specimen collection/handling, submission of specimen other than nasopharyngeal swab, presence of viral mutation(s) within the areas targeted by this assay, and inadequate number of viral copies(<138 copies/mL). A negative result must be combined with clinical observations, patient history, and epidemiological information. The expected result is Negative.  Fact Sheet for Patients:  EntrepreneurPulse.com.au  Fact Sheet for Healthcare Providers:  IncredibleEmployment.be  This test is no t yet approved or cleared by the [place] FDA and  has been authorized for detection and/or diagnosis of SARS-CoV-2 by FDA under an Emergency Use Authorization (EUA). This EUA will remain  in effect (meaning this test can be used) for the duration of the COVID-19 declaration under Section 564(b)(1) of the Act, 21 U.S.C.section 360bbb-3(b)(1), unless the authorization is terminated  or revoked sooner.       Influenza A by PCR NEGATIVE NEGATIVE Final   Influenza B by PCR NEGATIVE NEGATIVE Final    Comment: (NOTE) The Xpert Xpress SARS-CoV-2/FLU/RSV plus assay is intended as an aid in the diagnosis of influenza from Nasopharyngeal swab specimens and should not be used as a sole basis for treatment. Nasal washings and aspirates are unacceptable for Xpert Xpress SARS-CoV-2/FLU/RSV testing.  Fact Sheet for  Patients: EntrepreneurPulse.com.au  Fact Sheet for Healthcare Providers: IncredibleEmployment.be  This test is not yet approved or cleared by the [place] FDA and has been authorized for detection and/or diagnosis of SARS-CoV-2 by FDA under an Emergency Use Authorization (EUA). This EUA will remain in effect (meaning this test can be used) for the duration of the COVID-19 declaration under Section 564(b)(1) of the Act, 21 U.S.C. section 360bbb-3(b)(1), unless the authorization is terminated or revoked.  Performed at [facility], [place] [address] [place], [place] [address]      Radiology Studies: CT ANGIO HEAD W OR WO CONTRAST  Result Date: 01/15/2021 CLINICAL DATA:  Recurrent syncope EXAM: CT ANGIOGRAPHY HEAD AND NECK TECHNIQUE: Multidetector CT imaging of the head and neck was performed using the standard protocol during bolus administration of intravenous contrast. Multiplanar CT image reconstructions and MIPs  were obtained to evaluate the vascular anatomy. Carotid stenosis measurements (when applicable) are obtained utilizing NASCET criteria, using the distal internal carotid diameter as the denominator. CONTRAST:  33mL OMNIPAQUE IOHEXOL 350 MG/ML SOLN COMPARISON:  None. FINDINGS: CT HEAD FINDINGS Brain: There is no mass, hemorrhage or extra-axial collection. The size and configuration of the ventricles and extra-axial CSF spaces are normal. There is hypoattenuation of the periventricular white matter, most commonly indicating chronic ischemic microangiopathy. Skull: The visualized skull base, calvarium and extracranial soft tissues are normal. Sinuses/Orbits: No fluid levels or advanced mucosal thickening of the visualized paranasal sinuses. No mastoid or middle ear effusion. The orbits are normal. CTA NECK FINDINGS SKELETON: There is no bony spinal canal stenosis. No lytic or blastic lesion. OTHER NECK: Normal pharynx, larynx and  major salivary glands. No cervical lymphadenopathy. Unremarkable thyroid gland. UPPER CHEST: Moderate right apical peribronchial opacities. Calcified mediastinal and hilar lymph nodes. AORTIC ARCH: There is calcific atherosclerosis of the aortic arch. There is no aneurysm, dissection or hemodynamically significant stenosis of the visualized portion of the aorta. Conventional 3 vessel aortic branching pattern. The visualized proximal subclavian arteries are widely patent. RIGHT CAROTID SYSTEM: Normal without aneurysm, dissection or stenosis. LEFT CAROTID SYSTEM: Normal without aneurysm, dissection or stenosis. VERTEBRAL ARTERIES: Left dominant configuration. Both origins are clearly patent. There is no dissection, occlusion or flow-limiting stenosis to the skull base (V1-V3 segments). CTA HEAD FINDINGS POSTERIOR CIRCULATION: --Vertebral arteries: Normal V4 segments. --Inferior cerebellar arteries: Normal. --Basilar artery: Normal. --Superior cerebellar arteries: Normal. --Posterior cerebral arteries (PCA): Normal. ANTERIOR CIRCULATION: --Intracranial internal carotid arteries: Normal. --Anterior cerebral arteries (ACA): Normal. Both A1 segments are present. Patent anterior communicating artery (a-comm). --Middle cerebral arteries (MCA): Normal. VENOUS SINUSES: As permitted by contrast timing, patent. ANATOMIC VARIANTS: None Review of the MIP images confirms the above findings. IMPRESSION: 1. No intracranial arterial occlusion or high-grade stenosis. 2. Moderate right apical peribronchial opacities, concerning for pneumonia or bronchitis. Aortic Atherosclerosis (ICD10-I70.0). Electronically Signed   By: Ulyses Jarred M.D.   On: 01/15/2021 22:24   CT ANGIO NECK W OR WO CONTRAST  Result Date: 01/15/2021 CLINICAL DATA:  Recurrent syncope EXAM: CT ANGIOGRAPHY HEAD AND NECK TECHNIQUE: Multidetector CT imaging of the head and neck was performed using the standard protocol during bolus administration of intravenous  contrast. Multiplanar CT image reconstructions and MIPs were obtained to evaluate the vascular anatomy. Carotid stenosis measurements (when applicable) are obtained utilizing NASCET criteria, using the distal internal carotid diameter as the denominator. CONTRAST:  64mL OMNIPAQUE IOHEXOL 350 MG/ML SOLN COMPARISON:  None. FINDINGS: CT HEAD FINDINGS Brain: There is no mass, hemorrhage or extra-axial collection. The size and configuration of the ventricles and extra-axial CSF spaces are normal. There is hypoattenuation of the periventricular white matter, most commonly indicating chronic ischemic microangiopathy. Skull: The visualized skull base, calvarium and extracranial soft tissues are normal. Sinuses/Orbits: No fluid levels or advanced mucosal thickening of the visualized paranasal sinuses. No mastoid or middle ear effusion. The orbits are normal. CTA NECK FINDINGS SKELETON: There is no bony spinal canal stenosis. No lytic or blastic lesion. OTHER NECK: Normal pharynx, larynx and major salivary glands. No cervical lymphadenopathy. Unremarkable thyroid gland. UPPER CHEST: Moderate right apical peribronchial opacities. Calcified mediastinal and hilar lymph nodes. AORTIC ARCH: There is calcific atherosclerosis of the aortic arch. There is no aneurysm, dissection or hemodynamically significant stenosis of the visualized portion of the aorta. Conventional 3 vessel aortic branching pattern. The visualized proximal subclavian arteries are widely patent. RIGHT CAROTID SYSTEM:  Normal without aneurysm, dissection or stenosis. LEFT CAROTID SYSTEM: Normal without aneurysm, dissection or stenosis. VERTEBRAL ARTERIES: Left dominant configuration. Both origins are clearly patent. There is no dissection, occlusion or flow-limiting stenosis to the skull base (V1-V3 segments). CTA HEAD FINDINGS POSTERIOR CIRCULATION: --Vertebral arteries: Normal V4 segments. --Inferior cerebellar arteries: Normal. --Basilar artery: Normal.  --Superior cerebellar arteries: Normal. --Posterior cerebral arteries (PCA): Normal. ANTERIOR CIRCULATION: --Intracranial internal carotid arteries: Normal. --Anterior cerebral arteries (ACA): Normal. Both A1 segments are present. Patent anterior communicating artery (a-comm). --Middle cerebral arteries (MCA): Normal. VENOUS SINUSES: As permitted by contrast timing, patent. ANATOMIC VARIANTS: None Review of the MIP images confirms the above findings. IMPRESSION: 1. No intracranial arterial occlusion or high-grade stenosis. 2. Moderate right apical peribronchial opacities, concerning for pneumonia or bronchitis. Aortic Atherosclerosis (ICD10-I70.0). Electronically Signed   By: Ulyses Jarred M.D.   On: 01/15/2021 22:24     LOS: 0 days   Antonieta Pert, MD [facility]  01/16/2021, 2:05 PM

## 2021-01-16 NOTE — Care Management Obs Status (Signed)
[facility] NOTIFICATION   Patient Details  Name: Kathy [name] MRN: [id number] Date of Birth: November 07, 1951   Medicare Observation Status Notification Given:  Yes    Lynnell Catalan, RN 01/16/2021, 1:02 PM

## 2021-01-16 NOTE — Evaluation (Signed)
Occupational Therapy Evaluation Patient Details Name: Kathy [name] MRN: [id number] DOB: 08-25-1951 Today's Date: 01/16/2021    History of Present Illness 70 year old female with past medical history of hypertension, osteoarthritis, fibromyalgia, diastolic congestive heart failure , pulmonary sarcoidosis,staatus post first MTP arthrodesis 1/19 presenting to [facility] via EMS 2* status post an episode of loss of consciousness, found hypo natremia.   Clinical Impression   Kathy [name] is a 70 year old woman admitted with above medical history. Patient having multiple syncopal episodes. ON evaluation patient demonstrates functional upper body strength - though RUE weaker than left and patient reports some pain in RUE from overuse due to walker. Patient demonstrates ability to perform bed mobility and ADLs. Patient ambulated to bathroom with RW and CAM boot. Patient reports she was unable to hop with HH PT and has been placing foot on ground with ambulation. Patient appears to still be placing to much weight through RLE despite instruction and verbal cues. From a functional stand point patient able to perform all ADLs and transfers. Does not need OT services. Patient performing pan baths at this time and not getting in the shower. Needs continued PT services to advance gait and help patient with maintaining weight bearing status.    Follow Up Recommendations  No OT follow up    Equipment Recommendations  None recommended by OT    Recommendations for Other Services       Precautions / Restrictions Precautions Precautions: Fall Precaution Comments: CAM boot right, using Knee scooter at home; multiple syncopal episodes. Restrictions Weight Bearing Restrictions: Yes RLE Weight Bearing: Non weight bearing Other Position/Activity Restrictions: per Podiatry note 1/24 "NWB in CAM boot but also discussed that she can put her weight down for balance and transfers if needed"      Mobility Bed  Mobility Overal bed mobility: Modified Independent                  Transfers Overall transfer level: Needs assistance Equipment used: Rolling walker (2 wheeled) Transfers: Sit to/from Stand Sit to Stand: Min guard         General transfer comment: Min guard to ambulate to bathroom with RW and CAM boot. Patient needs verbal cues and instructions to maintain wb status - and still probably bearing too much weight through foot.    Balance Overall balance assessment: Mild deficits observed, not formally tested                                         ADL either performed or assessed with clinical judgement   ADL Overall ADL's : At baseline                                       General ADL Comments: Able to perform all aspects of ADLs, donning socks, donning CAM boot, perform toileting, performing mobility to perform ADLs. Needs verbal cue for weight bearing.     Vision Baseline Vision/History: Wears glasses Wears Glasses: At all times       Perception     Praxis      Pertinent Vitals/Pain Pain Assessment: No/denies pain     Hand Dominance Right   Extremity/Trunk Assessment Upper Extremity Assessment Upper Extremity Assessment: Overall WFL for tasks assessed RUE Deficits / Details: Reports some pain in RUE  from "overuse", overall a little weaker in RUE shoulder, elbow.   Lower Extremity Assessment Lower Extremity Assessment: Defer to PT evaluation   Cervical / Trunk Assessment Cervical / Trunk Assessment: Normal   Communication Communication Communication: No difficulties   Cognition Arousal/Alertness: Awake/alert Behavior During Therapy: WFL for tasks assessed/performed Overall Cognitive Status: Within Functional Limits for tasks assessed                                     General Comments       Exercises     Shoulder Instructions      Home Living Family/patient expects to be discharged to::  Private residence Living Arrangements: Children Available Help at Discharge: Family Type of Home: House Home Access: Stairs to enter Technical brewer of Steps: 2 Entrance Stairs-Rails: Right Home Layout: One level     Bathroom Shower/Tub: Teacher, early years/pre: Handicapped height     Home Equipment: Environmental consultant - 2 wheels;Bedside commode;Other (comment)   Additional Comments: knee scooter, getting HH POT      Prior Functioning/Environment Level of Independence: Independent with assistive device(s)        Comments: with knee scooter        OT Problem List:        OT Treatment/Interventions:      OT Goals(Current goals can be found in the care plan section) Acute Rehab OT Goals Patient Stated Goal: to go home OT Goal Formulation: All assessment and education complete, DC therapy  OT Frequency:     Barriers to D/C:            Co-evaluation              AM-PAC OT "6 Clicks" Daily Activity     Outcome Measure Help from another person eating meals?: None Help from another person taking care of personal grooming?: None Help from another person toileting, which includes using toliet, bedpan, or urinal?: None Help from another person bathing (including washing, rinsing, drying)?: None Help from another person to put on and taking off regular upper body clothing?: None Help from another person to put on and taking off regular lower body clothing?: None 6 Click Score: 24   End of Session Equipment Utilized During Treatment: Rolling walker;Gait belt Nurse Communication: Mobility status  Activity Tolerance: Patient tolerated treatment well Patient left: in chair;with call bell/phone within reach;with chair alarm set  OT Visit Diagnosis: Other abnormalities of gait and mobility (R26.89);Dizziness and giddiness (R42)                Time: 9381-8299 OT Time Calculation (min): 30 min Charges:  OT General Charges $OT Visit: 1 Visit OT  Evaluation $OT Eval Low Complexity: 1 Low OT Treatments $Self Care/Home Management : 8-22 mins  Cadin Luka, OTR/L [place]  Office [phone] Pager: [phone]   Lenward Chancellor 01/16/2021, 10:50 AM

## 2021-01-17 DIAGNOSIS — I5032 Chronic diastolic (congestive) heart failure: Secondary | ICD-10-CM | POA: Diagnosis not present

## 2021-01-17 LAB — BASIC METABOLIC PANEL
Anion gap: 10 (ref 5–15)
BUN: <5 mg/dL — ABNORMAL LOW (ref 8–23)
CO2: 22 mmol/L (ref 22–32)
Calcium: 8.9 mg/dL (ref 8.9–10.3)
Chloride: 107 mmol/L (ref 98–111)
Creatinine, Ser: 0.41 mg/dL — ABNORMAL LOW (ref 0.44–1.00)
GFR, Estimated: >60 mL/min (ref >60)
Glucose, Bld: 94 mg/dL (ref 70–99)
Potassium: 3.4 mmol/L — ABNORMAL LOW (ref 3.5–5.1)
Sodium: 139 mmol/L (ref 135–145)

## 2021-01-17 LAB — ANGIOTENSIN CONVERTING ENZYME: Angiotensin-Converting Enzyme: 30 U/L (ref 14–82)

## 2021-01-17 LAB — GLUCOSE, CAPILLARY
Glucose-Capillary: 94 mg/dL (ref 70–99)
Glucose-Capillary: 89 mg/dL (ref 70–99)

## 2021-01-17 LAB — ACTH STIMULATION, 3 TIME POINTS
Cortisol, 30 Min: 23.3 ug/dL
Cortisol, 60 Min: 25.8 ug/dL
Cortisol, Base: 8.6 ug/dL

## 2021-01-17 LAB — ANA W/REFLEX IF POSITIVE: Anti Nuclear Antibody (ANA): NEGATIVE

## 2021-01-17 LAB — RHEUMATOID FACTOR: Rheumatoid fact SerPl-aCnc: 16 IU/mL — ABNORMAL HIGH (ref <14.0)

## 2021-01-17 MED ORDER — FOLIC ACID 1 MG PO TABS
1.0000 mg | ORAL_TABLET | Freq: Every day | ORAL | Status: DC
  Administered 2021-01-18: 1 mg via ORAL
  Filled 2021-01-17: qty 1

## 2021-01-17 MED ORDER — POTASSIUM CHLORIDE CRYS ER 20 MEQ PO TBCR
40.0000 meq | EXTENDED_RELEASE_TABLET | Freq: Once | ORAL | Status: AC
  Administered 2021-01-17: 40 meq via ORAL
  Filled 2021-01-17: qty 2

## 2021-01-17 NOTE — TOC Progression Note (Signed)
Transition of Care [facility]) - Progression Note    Patient Details  Name: Kathy [name] MRN: [id number] Date of Birth: 02-14-1951  Transition of [facility] CM/SW Contact  Klynn Linnemann, Marjie Skiff, RN Phone Number: 01/17/2021, 1:24 PM  Clinical Narrative:    Pt from home with children. No PT follow up was recommendation from physical therapy. Pt is requesting wheelchair for home. Order received and [employer] contacted for delivery.

## 2021-01-17 NOTE — Progress Notes (Signed)
PROGRESS NOTE    Kathy [name]  XHB:[id number] DOB: 08-24-1951 DOA: 01/13/2021 PCP: Denita Lung, MD   Chief Complaint  Patient presents with  . Loss of Consciousness  . Fall   Brief Narrative: 87yof w/ past medical history of hypertension, osteoarthritis, fibromyalgia, chronic diastolic CHF Echo 81/01 EF 55 to 60%, pulmonary sarcoidosis, hallux rigidus of first toe s/p first MTP arthrodesis presented to the ED after episode of syncope while getting up and coming out from her bathroom.  She was also recently admitted from 12/23 through 12/28 after a syncopal episode-following recent antibiotic for UTI after which she had couple days of nausea vomiting abdominal pain and diarrhea subsequently leading to episode of syncope at home while getting up to go to the bathroom-suspected to be due to vasovagal episode/orthostatic hypotension had negative work-up with echocardiogram, CT head, was also treated for nausea abdominal pain and colitis. She presented to the ED after episode of syncope 2/15 on admission CT head CT C-spine unremarkable, UA negative CT chest negative for PE but area of right upper lobe with scarring and superimposed infectious or inflammatory process concerning for pneumonia, start on ceftriaxone azithromycin for community-acquired pneumonia and was admitted. Patient underwent further work-up-negative orthostatic vitals, EEG no acute finding. 2/16 evening- rapid response called-she had episode of syncope for brief few seconds and was examined urgently at bedside, this happened right after vomiting and loose bowel movement,had constipation earlier and had taken laxatives.No post ictal confusion.Nonfocal on exam. Orthostatics were obtained and she was in the chair and negative for rapid response nurse. Telemetry reviewed sinus rhythm no bradycardia tachycardia or any other arrhythmia. ? Vagal episode r/o other etiology.Her blood pressure earlier was low 52/49  but was not rechecked,  she was ordered half liter bolus normal saline  Subjective: Patient feels much improved after IV fluid bolus no headache no recurrent dizziness    Assessment & Plan:  Syncopal episode: recurrent, had similar episode in Dec 3032.EEG no seizure,CT head, CT C-spine no acute finding this time too. Episode happened after getting up from the bathroom question orthostatic but negative. BP stable and amlodipine and ARBS stopped. In December syncope was in the setting of diarrhea/colitis back, TTE was okay. Tele stable here.  Again had a recurrent episode in hospital 2/16-was nonfocal, no postictal symptoms, tele NSR,and syncope happened after diarrhea and vomiting episode.Neurology eval appreciated-she is adequately hydrated and seems to have improved.She is set up for 30-day event monitoring (sent msg to Tr[facility] cardio, who acnkowledged).  Probable vasovagal syncope.  Question peripheral neuropathy with possible familial basis-work-up started per neurology for treatable causes of peripheral neuropathy, will need outpatient follow-up with neurology-we will for follow-up on the labs. Skin TB test was done 2/17 and pending read 2/19. If tachycardia persist after volume repletion can consider cardiology evaluation for tilt table, beta-blocker trial-less likely needed at this time.  Continue on repletion of her folic acid.  Cosyntropin test came back normal this morning  Folate deficiency continue replacement.    Lactic acidosis as high as 4.9 suspect in the setting of dehydration.  Resolved.  Hypokalemia improved  Hyponatremia likely in the setting of hydrochlorothiazide/dehydration.  Resolved.   Community-acquired pneumonia right upper lobe: Continue ceftriaxone/azithromycin-hemodynamically stable.wbc w/ leucopenia, cbc in am. Recent Labs  Lab 01/13/21 0324 01/13/21 0647 01/13/21 0903 01/14/21 0553 01/14/21 1723 01/14/21 2144 01/15/21 0553 01/15/21 0901  WBC 4.0  --  2.2* 3.6*  --   --  3.7*  --    LATICACIDVEN  --  3.5*  --   --  2.9* 4.9*  --  1.0  PROCALCITON  --   --   --  <0.10  --   --   --   --    Pulmonary sarcoidosis history outpatient follow-up Essential hypertension: Blood pressure stable despite holding off on amlodipine/ARB/ HCTZ-given patient's recurrent syncope-will not resume on discharge and will ask her to follow-up with PCP.  Chronic diastolic CHF history EF 50 to 02% grade 1 diastolic dysfunction.  Tolerated ivf.  Hallux rigidus of right foot: Status post first MTP arthrodesis 1/19.  Not able to bear weight on right foot.  She has outpatient follow-up with her surgeon  Morbid obesity BMI more than 30 outpatient follow-up  Nutrition: Diet Order            Diet 2 gram sodium Room service appropriate? Yes; Fluid consistency: Thin  Diet effective now               Pt's Body mass index is 30.19 kg/m. DVT prophylaxis: enoxaparin (LOVENOX) injection 40 mg Start: 01/13/21 1000 Code Status:   Code Status: Full Code Family Communication: plan of care discussed with patient at bedside.  Status is: Inpatient  Patient continues to require hospitalization for ongoing management of her pneumonia as well as recurrent syncope, episode of hypotension needing further work-up-pending skin TB test read  2/19,   Dispo: The patient is from: Home              Anticipated d/c is to: Home              Anticipated d/c date is: 1 day              Patient currently is not medically stable to d/c.   Difficult to place patient No Consultants:see note  Procedures:see note  Unresulted Labs (From admission, onward)          Start     Ordered   01/16/21 0732  Protein electrophoresis, serum  Once,   R       Question:  Specimen collection method  Answer:  Lab=Lab collect   01/16/21 0734   01/16/21 0732  UPEP/UIFE/Light Chains/TP, 24-Hr Ur  Once,   R        01/16/21 0734   01/16/21 0732  Vitamin B1  Once,   R       Question:  Specimen collection method  Answer:  Lab=Lab collect    01/16/21 0734   01/16/21 5852  Basic metabolic panel  Daily,   R     Question:  Specimen collection method  Answer:  Lab=Lab collect   01/15/21 1418          Culture/Microbiology    Component Value Date/Time   SDES  01/13/2021 0545    BRONCHIAL ALVEOLAR LAVAGE RIGHT ANTECUBITAL Performed at [facility], [place] [address], [place], [place] [address]    [place]  01/13/2021 0545    BOTTLES DRAWN AEROBIC AND ANAEROBIC Blood Culture results may not be optimal due to an inadequate volume of blood received in culture bottles Performed at [facility], [place] [address] [place], [place] [address]    CULT  01/13/2021 0545    NO GROWTH 4 DAYS Performed at [place] [address], [place], [place] [address]    REPTSTATUS PENDING 01/13/2021 0545    Other culture-see note  Medications: Scheduled Meds: . cycloSPORINE  1 drop Both Eyes BID  . enoxaparin (LOVENOX)  injection  40 mg Subcutaneous Q24H  . [START ON 05/06/3709] folic acid  1 mg Oral Daily  . melatonin  5 mg Oral QHS  . sodium chloride flush  3 mL Intravenous Q12H  . tuberculin  5 Units Intradermal Once   Continuous Infusions: . cefTRIAXone (ROCEPHIN)  IV Stopped (01/16/21 1808)    Antimicrobials: Anti-infectives (From admission, onward)   Start     Dose/Rate Route Frequency Ordered Stop   01/15/21 1000  azithromycin (ZITHROMAX) tablet 500 mg        500 mg Oral Daily 01/14/21 1108 01/17/21 1008   01/13/21 1800  cefTRIAXone (ROCEPHIN) 2 g in sodium chloride 0.9 % 100 mL IVPB        2 g 200 mL/hr over 30 Minutes Intravenous Every 24 hours 01/13/21 0641 01/18/21 1759   01/13/21 0800  azithromycin (ZITHROMAX) 500 mg in sodium chloride 0.9 % 250 mL IVPB  Status:  Discontinued        500 mg 250 mL/hr over 60 Minutes Intravenous Every 24 hours 01/13/21 0641 01/14/21 1108   01/13/21 0545  vancomycin (VANCOCIN) IVPB 1000 mg/200 mL premix  Status:  Discontinued        1,000  mg 200 mL/hr over 60 Minutes Intravenous  Once 01/13/21 0536 01/13/21 0701   01/13/21 0545  ceFEPIme (MAXIPIME) 2 g in sodium chloride 0.9 % 100 mL IVPB        2 g 200 mL/hr over 30 Minutes Intravenous STAT 01/13/21 0536 01/13/21 0620     Objective: Vitals: Today's Vitals   01/17/21 0626 01/17/21 1047 01/17/21 1344 01/17/21 1603  BP:  127/66 122/74   Pulse:  86 82   Resp:      Temp:  98.7 F (37.1 C) (!) 97.5 F (36.4 C)   TempSrc:  Oral Oral   SpO2:  100% 100%   Weight:      Height:      PainSc: Asleep 0-No pain  0-No pain    Intake/Output Summary (Last 24 hours) at 01/17/2021 1638 Last data filed at 01/17/2021 0750 Gross per 24 hour  Intake 390 ml  Output 2200 ml  Net -1810 ml   Filed Weights   01/13/21 0324 01/14/21 0500 01/15/21 0515  Weight: 77.1 kg 79.7 kg 77.3 kg   Weight change:   Intake/Output from previous day: 02/17 0701 - 02/18 0700 In: 870 [P.O.:720; IV Piggyback:150] Out: 1600 [Urine:1600] Intake/Output this shift: Total I/O In: -  Out: 600 [Urine:600] Filed Weights   01/13/21 0324 01/14/21 0500 01/15/21 0515  Weight: 77.1 kg 79.7 kg 77.3 kg    Examination: General exam: AAOx3 , NAD, weak appearing. HEENT:Oral mucosa moist, Ear/Nose WNL grossly, dentition normal. Respiratory system: bilaterally clear,no wheezing or crackles,no use of accessory muscle Cardiovascular system: S1 & S2 +, No JVD,. Gastrointestinal system: Abdomen soft, NT,ND, BS+ Nervous System:Alert, awake, moving extremities and grossly nonfocal Extremities: No edema, distal peripheral pulses palpable.  Skin: No rashes,no icterus. MSK: Normal muscle bulk,tone, power.  Data Reviewed: I have personally reviewed following labs and imaging studies CBC: Recent Labs  Lab 01/13/21 0324 01/13/21 0334 01/13/21 0903 01/14/21 0553 01/15/21 0553  WBC 4.0  --  2.2* 3.6* 3.7*  NEUTROABS 1.4*  --   --  1.5*  --   HGB 14.4 15.0 10.1* 12.3 13.3  HCT 40.3 44.0 28.5* 35.3* 38.3  MCV 89.8   --  91.6 91.9 93.2  PLT 359  --  215 270 626   Basic Metabolic Panel:  Recent Labs  Lab 01/13/21 0903 01/13/21 1124 01/14/21 0553 01/15/21 0553 01/16/21 0520 01/17/21 0450  NA 144 138 135 139 139 139  K 2.7* 3.6 3.5 3.0* 3.8 3.4*  CL 109 100 101 104 106 107  CO2 17* 21* 23 23 23 22   GLUCOSE 64* 78 98 93 94 94  BUN 6* 8 7* <5* 5* <5*  CREATININE 0.30* 0.55 0.57 0.44 0.54 0.41*  CALCIUM 5.9* 8.6* 8.5* 9.0 9.4 8.9  MG 1.6*  --  2.3 1.8  --   --   PHOS 2.6  --  3.6 3.2  --   --    GFR: Estimated Creatinine Clearance: 65.4 mL/min (A) (by C-G formula based on SCr of 0.41 mg/dL (L)). Liver Function Tests: Recent Labs  Lab 01/14/21 0553  AST 22  ALT 11  ALKPHOS 85  BILITOT 1.5*  PROT 6.1*  ALBUMIN 3.5   No results for input(s): LIPASE, AMYLASE in the last 168 hours. No results for input(s): AMMONIA in the last 168 hours. Coagulation Profile: No results for input(s): INR, PROTIME in the last 168 hours. Cardiac Enzymes: No results for input(s): CKTOTAL, CKMB, CKMBINDEX, TROPONINI in the last 168 hours. BNP (last 3 results) No results for input(s): PROBNP in the last 8760 hours. HbA1C: No results for input(s): HGBA1C in the last 72 hours. CBG: Recent Labs  Lab 01/15/21 0754 01/15/21 1623 01/16/21 0548 01/17/21 0441 01/17/21 0738  GLUCAP 92 98 99 94 89   Lipid Profile: No results for input(s): CHOL, HDL, LDLCALC, TRIG, CHOLHDL, LDLDIRECT in the last 72 hours. Thyroid Function Tests: No results for input(s): TSH, T4TOTAL, FREET4, T3FREE, THYROIDAB in the last 72 hours. Anemia Panel: Recent Labs    01/16/21 0832  VITAMINB12 740  FOLATE 4.7*   Sepsis Labs: Recent Labs  Lab 01/13/21 0647 01/14/21 0553 01/14/21 1723 01/14/21 2144 01/15/21 0901  PROCALCITON  --  <0.10  --   --   --   LATICACIDVEN 3.5*  --  2.9* 4.9* 1.0    Recent Results (from the past 240 hour(s))  Blood culture (routine x 2)     Status: None (Preliminary result)   Collection Time:  01/13/21  5:30 AM   Specimen: Bronchoalveolar Lavage; Blood  Result Value Ref Range Status   Specimen Description   Final    BRONCHIAL ALVEOLAR LAVAGE RIGHT ANTECUBITAL Performed at [facility], [place] [address], [place], [place] [address]    Special Requests   Final    BOTTLES DRAWN AEROBIC AND ANAEROBIC Blood Culture results may not be optimal due to an inadequate volume of blood received in culture bottles Performed at [facility] [address], [place], [place] [address]    Culture   Final    NO GROWTH 4 DAYS Performed at [facility], [place] [address] [place], [place] [address]    Report Status PENDING  Incomplete  Blood culture (routine x 2)     Status: None (Preliminary result)   Collection Time: 01/13/21  5:45 AM   Specimen: Bronchoalveolar Lavage; Blood  Result Value Ref Range Status   Specimen Description   Final    BRONCHIAL ALVEOLAR LAVAGE RIGHT ANTECUBITAL Performed at [place] [address] [place],  [address]    Special Requests   Final    BOTTLES DRAWN AEROBIC AND ANAEROBIC Blood Culture results may not be optimal due to an inadequate volume of blood received in culture bottles Performed at [facility], [place] [address] [place], [place] [address]    Culture   Final    NO GROWTH 4 DAYS Performed at [facility], [place] [address] [place], [place] [address]    Report Status PENDING  Incomplete  Resp Panel by RT-PCR (Flu A&B, Covid) Nasopharyngeal Swab     Status: None   Collection Time: 01/13/21  6:15 AM   Specimen: Nasopharyngeal Swab; Nasopharyngeal(NP) swabs in vial transport medium  Result Value Ref Range Status   SARS Coronavirus 2 by RT PCR NEGATIVE NEGATIVE Final    Comment: (NOTE) SARS-CoV-2 target nucleic acids are NOT DETECTED.  The SARS-CoV-2 RNA is generally detectable in upper respiratory specimens during the acute phase of infection. The  lowest concentration of SARS-CoV-2 viral copies this assay can detect is 138 copies/mL. A negative result does not preclude SARS-Cov-2 infection and should not be used as the sole basis for treatment or other patient management decisions. A negative result may occur with  improper specimen collection/handling, submission of specimen other than nasopharyngeal swab, presence of viral mutation(s) within the areas targeted by this assay, and inadequate number of viral copies(<138 copies/mL). A negative result must be combined with clinical observations, patient history, and epidemiological information. The expected result is Negative.  Fact Sheet for Patients:  EntrepreneurPulse.com.au  Fact Sheet for Healthcare Providers:  IncredibleEmployment.be  This test is no t yet approved or cleared by the [place] FDA and  has been authorized for detection and/or diagnosis of SARS-CoV-2 by FDA under an Emergency Use Authorization (EUA). This EUA will remain  in effect (meaning this test can be used) for the duration of the COVID-19 declaration under Section 564(b)(1) of the Act, 21 U.S.C.section 360bbb-3(b)(1), unless the authorization is terminated  or revoked sooner.       Influenza A by PCR NEGATIVE NEGATIVE Final   Influenza B by PCR NEGATIVE NEGATIVE Final    Comment: (NOTE) The Xpert Xpress SARS-CoV-2/FLU/RSV plus assay is intended as an aid in the diagnosis of influenza from Nasopharyngeal swab specimens and should not be used as a sole basis for treatment. Nasal washings and aspirates are unacceptable for Xpert Xpress SARS-CoV-2/FLU/RSV testing.  Fact Sheet for Patients: EntrepreneurPulse.com.au  Fact Sheet for Healthcare Providers: IncredibleEmployment.be  This test is not yet approved or cleared by the [place] FDA and has been authorized for detection and/or diagnosis of SARS-CoV-2 by FDA under  an Emergency Use Authorization (EUA). This EUA will remain in effect (meaning this test can be used) for the duration of the COVID-19 declaration under Section 564(b)(1) of the Act, 21 U.S.C. section 360bbb-3(b)(1), unless the authorization is terminated or revoked.  Performed at [facility], [place] [address] [place], [place] [address]      Radiology Studies: CT ANGIO HEAD W OR WO CONTRAST  Result Date: 01/15/2021 CLINICAL DATA:  Recurrent syncope EXAM: CT ANGIOGRAPHY HEAD AND NECK TECHNIQUE: Multidetector CT imaging of the head and neck was performed using the standard protocol during bolus administration of intravenous contrast. Multiplanar CT image reconstructions and MIPs were obtained to evaluate the vascular anatomy. Carotid stenosis measurements (when applicable) are obtained utilizing NASCET criteria, using the distal internal carotid diameter as the denominator. CONTRAST:  73mL OMNIPAQUE IOHEXOL 350 MG/ML SOLN COMPARISON:  None. FINDINGS: CT HEAD FINDINGS Brain: There is no mass, hemorrhage or extra-axial collection. The size and configuration of the ventricles and extra-axial CSF spaces are normal. There is hypoattenuation of the periventricular white matter, most commonly indicating  chronic ischemic microangiopathy. Skull: The visualized skull base, calvarium and extracranial soft tissues are normal. Sinuses/Orbits: No fluid levels or advanced mucosal thickening of the visualized paranasal sinuses. No mastoid or middle ear effusion. The orbits are normal. CTA NECK FINDINGS SKELETON: There is no bony spinal canal stenosis. No lytic or blastic lesion. OTHER NECK: Normal pharynx, larynx and major salivary glands. No cervical lymphadenopathy. Unremarkable thyroid gland. UPPER CHEST: Moderate right apical peribronchial opacities. Calcified mediastinal and hilar lymph nodes. AORTIC ARCH: There is calcific atherosclerosis of the aortic arch. There is no aneurysm, dissection or  hemodynamically significant stenosis of the visualized portion of the aorta. Conventional 3 vessel aortic branching pattern. The visualized proximal subclavian arteries are widely patent. RIGHT CAROTID SYSTEM: Normal without aneurysm, dissection or stenosis. LEFT CAROTID SYSTEM: Normal without aneurysm, dissection or stenosis. VERTEBRAL ARTERIES: Left dominant configuration. Both origins are clearly patent. There is no dissection, occlusion or flow-limiting stenosis to the skull base (V1-V3 segments). CTA HEAD FINDINGS POSTERIOR CIRCULATION: --Vertebral arteries: Normal V4 segments. --Inferior cerebellar arteries: Normal. --Basilar artery: Normal. --Superior cerebellar arteries: Normal. --Posterior cerebral arteries (PCA): Normal. ANTERIOR CIRCULATION: --Intracranial internal carotid arteries: Normal. --Anterior cerebral arteries (ACA): Normal. Both A1 segments are present. Patent anterior communicating artery (a-comm). --Middle cerebral arteries (MCA): Normal. VENOUS SINUSES: As permitted by contrast timing, patent. ANATOMIC VARIANTS: None Review of the MIP images confirms the above findings. IMPRESSION: 1. No intracranial arterial occlusion or high-grade stenosis. 2. Moderate right apical peribronchial opacities, concerning for pneumonia or bronchitis. Aortic Atherosclerosis (ICD10-I70.0). Electronically Signed   By: Ulyses Jarred M.D.   On: 01/15/2021 22:24   CT ANGIO NECK W OR WO CONTRAST  Result Date: 01/15/2021 CLINICAL DATA:  Recurrent syncope EXAM: CT ANGIOGRAPHY HEAD AND NECK TECHNIQUE: Multidetector CT imaging of the head and neck was performed using the standard protocol during bolus administration of intravenous contrast. Multiplanar CT image reconstructions and MIPs were obtained to evaluate the vascular anatomy. Carotid stenosis measurements (when applicable) are obtained utilizing NASCET criteria, using the distal internal carotid diameter as the denominator. CONTRAST:  79mL OMNIPAQUE IOHEXOL 350  MG/ML SOLN COMPARISON:  None. FINDINGS: CT HEAD FINDINGS Brain: There is no mass, hemorrhage or extra-axial collection. The size and configuration of the ventricles and extra-axial CSF spaces are normal. There is hypoattenuation of the periventricular white matter, most commonly indicating chronic ischemic microangiopathy. Skull: The visualized skull base, calvarium and extracranial soft tissues are normal. Sinuses/Orbits: No fluid levels or advanced mucosal thickening of the visualized paranasal sinuses. No mastoid or middle ear effusion. The orbits are normal. CTA NECK FINDINGS SKELETON: There is no bony spinal canal stenosis. No lytic or blastic lesion. OTHER NECK: Normal pharynx, larynx and major salivary glands. No cervical lymphadenopathy. Unremarkable thyroid gland. UPPER CHEST: Moderate right apical peribronchial opacities. Calcified mediastinal and hilar lymph nodes. AORTIC ARCH: There is calcific atherosclerosis of the aortic arch. There is no aneurysm, dissection or hemodynamically significant stenosis of the visualized portion of the aorta. Conventional 3 vessel aortic branching pattern. The visualized proximal subclavian arteries are widely patent. RIGHT CAROTID SYSTEM: Normal without aneurysm, dissection or stenosis. LEFT CAROTID SYSTEM: Normal without aneurysm, dissection or stenosis. VERTEBRAL ARTERIES: Left dominant configuration. Both origins are clearly patent. There is no dissection, occlusion or flow-limiting stenosis to the skull base (V1-V3 segments). CTA HEAD FINDINGS POSTERIOR CIRCULATION: --Vertebral arteries: Normal V4 segments. --Inferior cerebellar arteries: Normal. --Basilar artery: Normal. --Superior cerebellar arteries: Normal. --Posterior cerebral arteries (PCA): Normal. ANTERIOR CIRCULATION: --Intracranial internal carotid arteries: Normal. --Anterior cerebral  arteries (ACA): Normal. Both A1 segments are present. Patent anterior communicating artery (a-comm). --Middle cerebral  arteries (MCA): Normal. VENOUS SINUSES: As permitted by contrast timing, patent. ANATOMIC VARIANTS: None Review of the MIP images confirms the above findings. IMPRESSION: 1. No intracranial arterial occlusion or high-grade stenosis. 2. Moderate right apical peribronchial opacities, concerning for pneumonia or bronchitis. Aortic Atherosclerosis (ICD10-I70.0). Electronically Signed   By: Ulyses Jarred M.D.   On: 01/15/2021 22:24     LOS: 1 day   Antonieta Pert, MD [facility]  01/17/2021, 4:38 PM

## 2021-01-17 NOTE — Progress Notes (Signed)
PHARMACIST - PHYSICIAN COMMUNICATION CONCERNING: IV to Oral Route Change Policy  RECOMMENDATION: This patient is receiving folic acid by the intravenous route.  Based on criteria approved by the Pharmacy and Therapeutics Committee, the intravenous medication(s) is/are being converted to the equivalent oral dose form(s).   DESCRIPTION: These criteria include:  The patient is eating (either orally or via tube) and/or has been taking other orally administered medications for a least 24 hours  The patient has no evidence of active gastrointestinal bleeding or impaired GI absorption (gastrectomy, short bowel, patient on TNA or NPO).  If you have questions about this conversion, please contact the Pharmacy Department  []   [phone] )  [facility] []   [phone] )  [facility] []   [phone] )  [facility] []   [phone] )  [facility] [x]   [phone] )  [facility]

## 2021-01-17 NOTE — Progress Notes (Signed)
    Durable Medical Equipment  (From admission, onward)         Start     Ordered   01/17/21 1322  For home use only DME lightweight manual wheelchair with seat cushion  Once       Comments: Patient suffers from weakness which impairs their ability to perform daily activities like walk in the home.  A walker will not resolve  issue with performing activities of daily living. A wheelchair will allow patient to safely perform daily activities. Patient is not able to propel themselves in the home using a standard weight wheelchair due to weakness. Patient can self propel in the lightweight wheelchair. Length of need lifetime Accessories: elevating leg rests (ELRs), wheel locks, extensions and anti-tippers.   01/17/21 1322

## 2021-01-18 DIAGNOSIS — I5032 Chronic diastolic (congestive) heart failure: Secondary | ICD-10-CM | POA: Diagnosis not present

## 2021-01-18 LAB — CULTURE, BLOOD (ROUTINE X 2)
Culture: NO GROWTH
Culture: NO GROWTH

## 2021-01-18 LAB — CBC
HCT: 34.8 % — ABNORMAL LOW (ref 36.0–46.0)
Hemoglobin: 12.1 g/dL (ref 12.0–15.0)
MCH: 32.5 pg (ref 26.0–34.0)
MCHC: 34.8 g/dL (ref 30.0–36.0)
MCV: 93.5 fL (ref 80.0–100.0)
Platelets: 192 10*3/uL (ref 150–400)
RBC: 3.72 MIL/uL — ABNORMAL LOW (ref 3.87–5.11)
RDW: 12.2 % (ref 11.5–15.5)
WBC: 4 10*3/uL (ref 4.0–10.5)
nRBC: 0 % (ref 0.0–0.2)

## 2021-01-18 LAB — BASIC METABOLIC PANEL
Anion gap: 9 (ref 5–15)
BUN: 8 mg/dL (ref 8–23)
CO2: 25 mmol/L (ref 22–32)
Calcium: 9 mg/dL (ref 8.9–10.3)
Chloride: 106 mmol/L (ref 98–111)
Creatinine, Ser: 0.46 mg/dL (ref 0.44–1.00)
GFR, Estimated: >60 mL/min (ref >60)
Glucose, Bld: 97 mg/dL (ref 70–99)
Potassium: 3.2 mmol/L — ABNORMAL LOW (ref 3.5–5.1)
Sodium: 140 mmol/L (ref 135–145)

## 2021-01-18 LAB — GLUCOSE, CAPILLARY: Glucose-Capillary: 102 mg/dL — ABNORMAL HIGH (ref 70–99)

## 2021-01-18 MED ORDER — POTASSIUM CHLORIDE CRYS ER 20 MEQ PO TBCR
40.0000 meq | EXTENDED_RELEASE_TABLET | ORAL | Status: AC
  Administered 2021-01-18 (×2): 40 meq via ORAL
  Filled 2021-01-18 (×2): qty 2

## 2021-01-18 MED ORDER — FOLIC ACID 1 MG PO TABS: 1.0000 mg | ORAL_TABLET | Freq: Every day | ORAL | 0 refills | Status: AC

## 2021-01-18 NOTE — Discharge Summary (Signed)
Physician Discharge Summary  Kathy [name] VQM:[id number] DOB: Oct 26, 1951 DOA: 01/13/2021  PCP: Denita Lung, MD  Admit date: 01/13/2021 Discharge date: 01/18/2021  Admitted From: Home Disposition: Home  Recommendations for Outpatient Follow-up:  1. Follow up with PCP in 1-2 weeks 2. Discontinued home olmesartan, amlodipine, hydrochlorothiazide for episodes of hypotension likely related to syncopal episodes 3. Ambulatory referral to cardiology for consideration of tilt table testing and event monitor 4. Amatory referral to neurology for consideration of further neuropathic work-up to include PNCV/EMG, nerve biopsy; was followed during hospitalization by neurology 5. Recommend BMP 1 week 6. Follow-up SPEP, UPEP that was pending at time of discharge  Home Health: None Equipment/Devices: Wheelchair  Discharge Condition: Stable CODE STATUS: Full code Diet recommendation: Heart healthy diet  History of present illness:  Kathy [name] is a 70 year old female with past medical history significant for essential hypertension, osteoarthritis, fibromyalgia, chronic diastolic congestive heart failure, pulmonary sarcoidosis, hallux rigidus stenosis of first toe status post first MTP arthrodesis who presented to the emergency department following syncopal episode.  Patient was getting up and coming out of her bathroom during occurrence.    In the ED, CT head, C-spine unrevealing.  Urinalysis unremarkable.  CT chest negative for PE but with right upper lobe scarring.  Hospital service consulted for further evaluation and management of recurrent syncopal episode.  Recently admitted from 12/23 through 12/28 after a syncopal episode-following recent antibiotic for UTI after which she had couple days of nausea vomiting abdominal pain and diarrhea subsequently leading to episode of syncope at home while getting up to go to the bathroom-suspected to be due to vasovagal episode/orthostatic  hypotension had negative work-up with echocardiogram, CT head, was also treated for nausea abdominal pain and colitis.   Hospital course:  Syncopal episode, recurrent Patient presenting to ED following recurrent syncopal episode at home.  Patient was apparently coming out of her bathroom during event.  Suspicious for vasovagal versus orthostasis.  CT head/C-spine without contrast with no acute intracranial abnormality and no acute displaced fracture or traumatic listhesis of the C-spine.  The angiogram chest with right upper lobe scarring, no central or segmental pulmonary embolism and mediastinal and bilateral hilar calcified lymph nodes consistent with sarcoidosis.  Urinalysis negative.  RPR nonreactive.  B12 74.0 within normal limits.  Random cortisol level within normal limits and cortisol stim test normal.  EEG with no seizures or epileptiform discharges.  Recent echocardiogram reviewed with LVEF 55 to 60%, no regional wall motion normalities, grade 1 diastolic dysfunction no significant valvular abnormalities.  Patient was monitored on telemetry during hospitalization with no signs of arrhythmia.  Patient was seen by neurology with recommendation of outpatient follow-up for PNCV, EM G, and possible nerve biopsy.  Suspect related to her underlying antihypertensive use in which her amlodipine, olmesartan and HCTZ were discontinued.  Blood pressures were monitored and remained within normal limits during hospitalization.  We will also refer to outpatient cardiology for consideration of tilt table testing and event monitor.  Hyponatremia, secondary to hypovolemia and thiazide diuretic use Sodium 127 on admission.  Urine sodium 16, urine osmolality 159, serum osmolality 350.  Likely complicated by home HCTZ use and poor oral intake.  Patient's HCTZ was discontinued and given gentle IV fluid hydration.  Sodium level improved to 140 at time of discharge.  Recommend BMP 1 week.  Folate deficiency Folate  level 4.7.  Continue folate supplementation on discharge.  Community acquired pneumonia Chest x-ray and CT chest with concerns of possible  infectious process.  Urine Legionella and strep pneumo antigen negative.  Patient completed course of antibiotics with azithromycin and ceftriaxone.  Chronic diastolic congestive heart failure, compensated Reviewed TTE 11/18/2020 with LVEF 50-60% with grade 1 diastolic dysfunction.  Discontinued home ARB due to syncope as above.  Outpatient follow-up with cardiology.  Hallux rigidus of right foot: Status post first MTP arthrodesis 1/19.  Not able to bear weight on right foot.  She has outpatient follow-up with her surgeon  Morbid obesity Body mass index is 31.32 kg/m.  Discussed with patient needs for aggressive lifestyle changes/weight loss as this complicates all facets of care.  Outpatient follow-up with PCP.  May benefit from bariatric evaluation outpatient.    Discharge Diagnoses:  Active Problems:   Pulmonary sarcoidosis (HCC)   Essential hypertension   Chronic diastolic CHF (congestive heart failure) (HCC)   Hallux rigidus of right foot    Discharge Instructions  Discharge Instructions    Ambulatory referral to Cardiology   Complete by: As directed    Ambulatory referral to Neurology   Complete by: As directed    An appointment is requested in approximately: 2 weeks   Call MD for:  difficulty breathing, headache or visual disturbances   Complete by: As directed    Call MD for:  extreme fatigue   Complete by: As directed    Call MD for:  persistant dizziness or light-headedness   Complete by: As directed    Call MD for:  persistant nausea and vomiting   Complete by: As directed    Call MD for:  severe uncontrolled pain   Complete by: As directed    Call MD for:  temperature >100.4   Complete by: As directed    Diet - low sodium heart healthy   Complete by: As directed    Increase activity slowly   Complete by: As directed     No wound care   Complete by: As directed      Allergies as of 01/18/2021      Reactions   Lortab [hydrocodone-acetaminophen] Nausea And Vomiting   Latex Other (See Comments)   Pt unsure of reaction!      Medication List    STOP taking these medications   ibuprofen 600 MG tablet Commonly known as: ADVIL   loperamide 2 MG capsule Commonly known as: IMODIUM   melatonin 5 MG Tabs   Olmesartan-amLODIPine-HCTZ 40-10-12.5 MG Tabs     TAKE these medications   acetaminophen 500 MG tablet Commonly known as: TYLENOL Take 2 tablets (1,000 mg total) by mouth every 8 (eight) hours as needed for moderate pain.   cycloSPORINE 0.05 % ophthalmic emulsion Commonly known as: RESTASIS Place 1 drop into both eyes 2 (two) times daily.   docusate sodium 100 MG capsule Commonly known as: COLACE Take 100 mg by mouth 2 (two) times daily as needed for mild constipation.   folic acid 1 MG tablet Commonly known as: FOLVITE Take 1 tablet (1 mg total) by mouth daily. Start taking on: January 19, 2021   ondansetron 4 MG tablet Commonly known as: Zofran Take 1 tablet (4 mg total) by mouth every 8 (eight) hours as needed for nausea or vomiting.   PreserVision AREDS 2 Caps Take 1 capsule by mouth 2 (two) times a day.            Durable Medical Equipment  (From admission, onward)         Start     Ordered   01/17/21  1322  For home use only DME lightweight manual wheelchair with seat cushion  Once       Comments: Patient suffers from weakness which impairs their ability to perform daily activities like walk in the home.  A walker will not resolve  issue with performing activities of daily living. A wheelchair will allow patient to safely perform daily activities. Patient is not able to propel themselves in the home using a standard weight wheelchair due to weakness. Patient can self propel in the lightweight wheelchair. Length of need lifetime Accessories: elevating leg rests (ELRs), wheel  locks, extensions and anti-tippers.   01/17/21 1322          Allergies  Allergen Reactions  . Lortab [Hydrocodone-Acetaminophen] Nausea And Vomiting  . Latex Other (See Comments)    Pt unsure of reaction!    Consultations:  Neurology   Procedures/Studies: EEG  Result Date: 01/13/2021 Lora Havens, MD     01/13/2021 12:46 PM Patient Name: DONIQUE HAMMONDS MRN: [id number] Epilepsy Attending: Lora Havens Referring Physician/Provider: Dr. Inda Merlin Date:  01/13/2021 Duration: 23.12 mins Patient history: 70 year old female with transient loss of consciousness.  EEG to evaluate for seizures. Level of alertness: Awake, asleep AEDs during EEG study: None Technical aspects: This EEG study was done with scalp electrodes positioned according to the 10-20 International system of electrode placement. Electrical activity was acquired at a sampling rate of 500Hz  and reviewed with a high frequency filter of 70Hz  and a low frequency filter of 1Hz . EEG data were recorded continuously and digitally stored. Description: The posterior dominant rhythm consists of 8-9 Hz activity of moderate voltage (25-35 uV) seen predominantly in posterior head regions, symmetric and reactive to eye opening and eye closing. Sleep was characterized by vertex waves, sleep spindles (12 to 14 Hz), maximal frontocentral region. Physiologic photic driving was seen during photic stimulation.  Hyperventilation was not performed.   IMPRESSION: This study is within normal limits. No seizures or epileptiform discharges were seen throughout the recording. Priyanka [name]   CT ANGIO HEAD W OR WO CONTRAST  Result Date: 01/15/2021 CLINICAL DATA:  Recurrent syncope EXAM: CT ANGIOGRAPHY HEAD AND NECK TECHNIQUE: Multidetector CT imaging of the head and neck was performed using the standard protocol during bolus administration of intravenous contrast. Multiplanar CT image reconstructions and MIPs were obtained to evaluate the  vascular anatomy. Carotid stenosis measurements (when applicable) are obtained utilizing NASCET criteria, using the distal internal carotid diameter as the denominator. CONTRAST:  87mL OMNIPAQUE IOHEXOL 350 MG/ML SOLN COMPARISON:  None. FINDINGS: CT HEAD FINDINGS Brain: There is no mass, hemorrhage or extra-axial collection. The size and configuration of the ventricles and extra-axial CSF spaces are normal. There is hypoattenuation of the periventricular white matter, most commonly indicating chronic ischemic microangiopathy. Skull: The visualized skull base, calvarium and extracranial soft tissues are normal. Sinuses/Orbits: No fluid levels or advanced mucosal thickening of the visualized paranasal sinuses. No mastoid or middle ear effusion. The orbits are normal. CTA NECK FINDINGS SKELETON: There is no bony spinal canal stenosis. No lytic or blastic lesion. OTHER NECK: Normal pharynx, larynx and major salivary glands. No cervical lymphadenopathy. Unremarkable thyroid gland. UPPER CHEST: Moderate right apical peribronchial opacities. Calcified mediastinal and hilar lymph nodes. AORTIC ARCH: There is calcific atherosclerosis of the aortic arch. There is no aneurysm, dissection or hemodynamically significant stenosis of the visualized portion of the aorta. Conventional 3 vessel aortic branching pattern. The visualized proximal subclavian arteries are widely patent. RIGHT CAROTID SYSTEM: Normal  without aneurysm, dissection or stenosis. LEFT CAROTID SYSTEM: Normal without aneurysm, dissection or stenosis. VERTEBRAL ARTERIES: Left dominant configuration. Both origins are clearly patent. There is no dissection, occlusion or flow-limiting stenosis to the skull base (V1-V3 segments). CTA HEAD FINDINGS POSTERIOR CIRCULATION: --Vertebral arteries: Normal V4 segments. --Inferior cerebellar arteries: Normal. --Basilar artery: Normal. --Superior cerebellar arteries: Normal. --Posterior cerebral arteries (PCA): Normal. ANTERIOR  CIRCULATION: --Intracranial internal carotid arteries: Normal. --Anterior cerebral arteries (ACA): Normal. Both A1 segments are present. Patent anterior communicating artery (a-comm). --Middle cerebral arteries (MCA): Normal. VENOUS SINUSES: As permitted by contrast timing, patent. ANATOMIC VARIANTS: None Review of the MIP images confirms the above findings. IMPRESSION: 1. No intracranial arterial occlusion or high-grade stenosis. 2. Moderate right apical peribronchial opacities, concerning for pneumonia or bronchitis. Aortic Atherosclerosis (ICD10-I70.0). Electronically Signed   By: Ulyses Jarred M.D.   On: 01/15/2021 22:24   CT Head Wo Contrast  Result Date: 01/13/2021 CLINICAL DATA:  Facial trauma status post fall with alcohol use EXAM: CT HEAD WITHOUT CONTRAST CT CERVICAL SPINE WITHOUT CONTRAST TECHNIQUE: Multidetector CT imaging of the head and cervical spine was performed following the standard protocol without intravenous contrast. Multiplanar CT image reconstructions of the cervical spine were also generated. COMPARISON:  CT head and cervical spine 11/21/2020 FINDINGS: CT HEAD FINDINGS Brain: Patchy and confluent areas of decreased attenuation are noted throughout the deep and periventricular white matter of the cerebral hemispheres bilaterally, compatible with chronic microvascular ischemic disease. No evidence of large-territorial acute infarction. No parenchymal hemorrhage. No mass lesion. No extra-axial collection. No mass effect or midline shift. No hydrocephalus. Basilar cisterns are patent. Vascular: No hyperdense vessel. Skull: No acute fracture or focal lesion. Sinuses/Orbits: Paranasal sinuses and mastoid air cells are clear. Bilateral lens replacement. Otherwise the orbits are unremarkable. Other: None. CT CERVICAL SPINE FINDINGS Alignment: Normal. Skull base and vertebrae: Similar-appearing multilevel degenerative changes of the spine that is most prominent at the C4 through C6 levels. No  acute fracture. No aggressive appearing focal osseous lesion or focal pathologic process. Soft tissues and spinal canal: No prevertebral fluid or swelling. No visible canal hematoma. Upper chest: Unremarkable. Other: Mild carotid artery calcifications within the neck. Redemonstration of a subcentimeter hypodensity within the right thyroid gland. IMPRESSION: 1. No acute intracranial abnormality. 2. No acute displaced fracture or traumatic listhesis of the cervical spine. Electronically Signed   By: Iven Finn M.D.   On: 01/13/2021 04:23   CT ANGIO NECK W OR WO CONTRAST  Result Date: 01/15/2021 CLINICAL DATA:  Recurrent syncope EXAM: CT ANGIOGRAPHY HEAD AND NECK TECHNIQUE: Multidetector CT imaging of the head and neck was performed using the standard protocol during bolus administration of intravenous contrast. Multiplanar CT image reconstructions and MIPs were obtained to evaluate the vascular anatomy. Carotid stenosis measurements (when applicable) are obtained utilizing NASCET criteria, using the distal internal carotid diameter as the denominator. CONTRAST:  37mL OMNIPAQUE IOHEXOL 350 MG/ML SOLN COMPARISON:  None. FINDINGS: CT HEAD FINDINGS Brain: There is no mass, hemorrhage or extra-axial collection. The size and configuration of the ventricles and extra-axial CSF spaces are normal. There is hypoattenuation of the periventricular white matter, most commonly indicating chronic ischemic microangiopathy. Skull: The visualized skull base, calvarium and extracranial soft tissues are normal. Sinuses/Orbits: No fluid levels or advanced mucosal thickening of the visualized paranasal sinuses. No mastoid or middle ear effusion. The orbits are normal. CTA NECK FINDINGS SKELETON: There is no bony spinal canal stenosis. No lytic or blastic lesion. OTHER NECK: Normal pharynx, larynx and  major salivary glands. No cervical lymphadenopathy. Unremarkable thyroid gland. UPPER CHEST: Moderate right apical peribronchial  opacities. Calcified mediastinal and hilar lymph nodes. AORTIC ARCH: There is calcific atherosclerosis of the aortic arch. There is no aneurysm, dissection or hemodynamically significant stenosis of the visualized portion of the aorta. Conventional 3 vessel aortic branching pattern. The visualized proximal subclavian arteries are widely patent. RIGHT CAROTID SYSTEM: Normal without aneurysm, dissection or stenosis. LEFT CAROTID SYSTEM: Normal without aneurysm, dissection or stenosis. VERTEBRAL ARTERIES: Left dominant configuration. Both origins are clearly patent. There is no dissection, occlusion or flow-limiting stenosis to the skull base (V1-V3 segments). CTA HEAD FINDINGS POSTERIOR CIRCULATION: --Vertebral arteries: Normal V4 segments. --Inferior cerebellar arteries: Normal. --Basilar artery: Normal. --Superior cerebellar arteries: Normal. --Posterior cerebral arteries (PCA): Normal. ANTERIOR CIRCULATION: --Intracranial internal carotid arteries: Normal. --Anterior cerebral arteries (ACA): Normal. Both A1 segments are present. Patent anterior communicating artery (a-comm). --Middle cerebral arteries (MCA): Normal. VENOUS SINUSES: As permitted by contrast timing, patent. ANATOMIC VARIANTS: None Review of the MIP images confirms the above findings. IMPRESSION: 1. No intracranial arterial occlusion or high-grade stenosis. 2. Moderate right apical peribronchial opacities, concerning for pneumonia or bronchitis. Aortic Atherosclerosis (ICD10-I70.0). Electronically Signed   By: Ulyses Jarred M.D.   On: 01/15/2021 22:24   CT Angio Chest PE W and/or Wo Contrast  Result Date: 01/13/2021 CLINICAL DATA:  Syncope, simple, normal neuro exam postoptachy withpassingout fall EXAM: CT ANGIOGRAPHY CHEST WITH CONTRAST TECHNIQUE: Multidetector CT imaging of the chest was performed using the standard protocol during bolus administration of intravenous contrast. Multiplanar CT image reconstructions and MIPs were obtained to  evaluate the vascular anatomy. CONTRAST:  18mL OMNIPAQUE IOHEXOL 350 MG/ML SOLN COMPARISON:  Chest x-ray 04/24/2016 FINDINGS: Cardiovascular: Satisfactory opacification of the pulmonary arteries to the segmental level. No evidence of pulmonary embolism. Normal heart size. No significant pericardial effusion. The thoracic aorta is normal in caliber. Mild atherosclerotic plaque of the thoracic aorta. Mild coronary artery calcifications. Mediastinum/Nodes: Calcified mediastinal and bilateral hilar lymph nodes. No enlarged mediastinal, hilar, or axillary lymph nodes. Thyroid gland, trachea, and esophagus demonstrate no significant findings. Lungs/Pleura: Right upper lobe bronchiectasis and peribronchial consolidation. Nodular-like consolidations within the right upper lobe. No focal consolidation or marked scarring of the remainder of the lungs. No pulmonary nodule or mass. No pleural effusion. No pneumothorax. Upper Abdomen: No acute abnormality. Musculoskeletal: No chest wall abnormality. No suspicious lytic or blastic osseous lesions. No acute displaced fracture. Multilevel degenerative changes of the spine. Review of the MIP images confirms the above findings. IMPRESSION: 1. Right upper lobe scarring with suggestion of superimposed infection/inflammation. Followup PA and lateral chest X-ray is recommended in 3-4 weeks following therapy to ensure resolution and exclude underlying malignancy. 2. No central or segmental pulmonary embolus. Limited evaluation more distally due to artifact. 3. Mediastinal and bilateral hilar calcified lymph nodes consistent with sarcoidosis. Electronically Signed   By: Iven Finn M.D.   On: 01/13/2021 04:35   CT Cervical Spine Wo Contrast  Result Date: 01/13/2021 CLINICAL DATA:  Facial trauma status post fall with alcohol use EXAM: CT HEAD WITHOUT CONTRAST CT CERVICAL SPINE WITHOUT CONTRAST TECHNIQUE: Multidetector CT imaging of the head and cervical spine was performed  following the standard protocol without intravenous contrast. Multiplanar CT image reconstructions of the cervical spine were also generated. COMPARISON:  CT head and cervical spine 11/21/2020 FINDINGS: CT HEAD FINDINGS Brain: Patchy and confluent areas of decreased attenuation are noted throughout the deep and periventricular white matter of the cerebral hemispheres bilaterally, compatible  with chronic microvascular ischemic disease. No evidence of large-territorial acute infarction. No parenchymal hemorrhage. No mass lesion. No extra-axial collection. No mass effect or midline shift. No hydrocephalus. Basilar cisterns are patent. Vascular: No hyperdense vessel. Skull: No acute fracture or focal lesion. Sinuses/Orbits: Paranasal sinuses and mastoid air cells are clear. Bilateral lens replacement. Otherwise the orbits are unremarkable. Other: None. CT CERVICAL SPINE FINDINGS Alignment: Normal. Skull base and vertebrae: Similar-appearing multilevel degenerative changes of the spine that is most prominent at the C4 through C6 levels. No acute fracture. No aggressive appearing focal osseous lesion or focal pathologic process. Soft tissues and spinal canal: No prevertebral fluid or swelling. No visible canal hematoma. Upper chest: Unremarkable. Other: Mild carotid artery calcifications within the neck. Redemonstration of a subcentimeter hypodensity within the right thyroid gland. IMPRESSION: 1. No acute intracranial abnormality. 2. No acute displaced fracture or traumatic listhesis of the cervical spine. Electronically Signed   By: Iven Finn M.D.   On: 01/13/2021 04:23   DG Foot Complete Right  Result Date: 12/23/2020 Please see detailed radiograph report in office note.     Subjective: Patient seen and examined at bedside, resting comfortably.  No specific complaints this morning.  Ready for discharge home.  Discussed with patient need to stop her home antihypertensives, was notably hypotensive and  likely syncopal episodes related to decreased blood pressures.  Also discussed outpatient follow-up with cardiology and neurology.  No other questions or concerns at this time.  Denies headache, no fever/chills/night sweats, no visual changes, no chest pain, palpitations, no shortness of breath, no abdominal pain, no nausea/vomiting/diarrhea, no weakness, no fatigue, no paresthesias.  No acute events overnight per nursing staff.  Discharge Exam: Vitals:   01/17/21 2106 01/18/21 0451  BP: 129/76 125/81  Pulse: 89 69  Resp: 16 16  Temp: 97.9 F (36.6 C) 97.8 F (36.6 C)  SpO2: 100% 100%   Vitals:   01/17/21 1344 01/17/21 2106 01/18/21 0451 01/18/21 0458  BP: 122/74 129/76 125/81   Pulse: 82 89 69   Resp:  16 16   Temp: (!) 97.5 F (36.4 C) 97.9 F (36.6 C) 97.8 F (36.6 C)   TempSrc: Oral Oral Oral   SpO2: 100% 100% 100%   Weight:    80.2 kg  Height:        General: Pt is alert, awake, not in acute distress Cardiovascular: RRR, S1/S2 +, no rubs, no gallops Respiratory: CTA bilaterally, no wheezing, no rhonchi, oxygenating well on room air Abdominal: Soft, NT, ND, bowel sounds + Extremities: no edema, no cyanosis    The results of significant diagnostics from this hospitalization (including imaging, microbiology, ancillary and laboratory) are listed below for reference.     Microbiology: Recent Results (from the past 240 hour(s))  Blood culture (routine x 2)     Status: None   Collection Time: 01/13/21  5:30 AM   Specimen: Bronchoalveolar Lavage; Blood  Result Value Ref Range Status   Specimen Description   Final    BRONCHIAL ALVEOLAR LAVAGE RIGHT ANTECUBITAL Performed at [facility] [address] [place], [place] [address]    Special Requests   Final    BOTTLES DRAWN AEROBIC AND ANAEROBIC Blood Culture results may not be optimal due to an inadequate volume of blood received in culture bottles Performed at [facility]  [address], [place], [place] [address]    Culture   Final    NO GROWTH 5 DAYS Performed at [facility]  Hospital Lab, [place] [address], [place], [place] [address]    Report Status 01/18/2021 FINAL  Final  Blood culture (routine x 2)     Status: None   Collection Time: 01/13/21  5:45 AM   Specimen: Bronchoalveolar Lavage; Blood  Result Value Ref Range Status   Specimen Description   Final    BRONCHIAL ALVEOLAR LAVAGE RIGHT ANTECUBITAL Performed at [facility] [address] [place], [place] [address]    Special Requests   Final    BOTTLES DRAWN AEROBIC AND ANAEROBIC Blood Culture results may not be optimal due to an inadequate volume of blood received in culture bottles Performed at [facility] [address], [place], [place] [address]    Culture   Final    NO GROWTH 5 DAYS Performed at [facility], [place] [address] [place], [place] [id number]    Report Status 01/18/2021 FINAL  Final  Resp Panel by RT-PCR (Flu A&B, Covid) Nasopharyngeal Swab     Status: None   Collection Time: 01/13/21  6:15 AM   Specimen: Nasopharyngeal Swab; Nasopharyngeal(NP) swabs in vial transport medium  Result Value Ref Range Status   SARS Coronavirus 2 by RT PCR NEGATIVE NEGATIVE Final    Comment: (NOTE) SARS-CoV-2 target nucleic acids are NOT DETECTED.  The SARS-CoV-2 RNA is generally detectable in upper respiratory specimens during the acute phase of infection. The lowest concentration of SARS-CoV-2 viral copies this assay can detect is 138 copies/mL. A negative result does not preclude SARS-Cov-2 infection and should not be used as the sole basis for treatment or other patient management decisions. A negative result may occur with  improper specimen collection/handling, submission of specimen other than nasopharyngeal swab, presence of viral mutation(s) within the areas targeted by this assay, and inadequate number of viral copies(<138 copies/mL). A negative  result must be combined with clinical observations, patient history, and epidemiological information. The expected result is Negative.  Fact Sheet for Patients:  EntrepreneurPulse.com.au  Fact Sheet for Healthcare Providers:  IncredibleEmployment.be  This test is no t yet approved or cleared by the [place] FDA and  has been authorized for detection and/or diagnosis of SARS-CoV-2 by FDA under an Emergency Use Authorization (EUA). This EUA will remain  in effect (meaning this test can be used) for the duration of the COVID-19 declaration under Section 564(b)(1) of the Act, 21 U.S.C.section 360bbb-3(b)(1), unless the authorization is terminated  or revoked sooner.       Influenza A by PCR NEGATIVE NEGATIVE Final   Influenza B by PCR NEGATIVE NEGATIVE Final    Comment: (NOTE) The Xpert Xpress SARS-CoV-2/FLU/RSV plus assay is intended as an aid in the diagnosis of influenza from Nasopharyngeal swab specimens and should not be used as a sole basis for treatment. Nasal washings and aspirates are unacceptable for Xpert Xpress SARS-CoV-2/FLU/RSV testing.  Fact Sheet for Patients: EntrepreneurPulse.com.au  Fact Sheet for Healthcare Providers: IncredibleEmployment.be  This test is not yet approved or cleared by the [place] FDA and has been authorized for detection and/or diagnosis of SARS-CoV-2 by FDA under an Emergency Use Authorization (EUA). This EUA will remain in effect (meaning this test can be used) for the duration of the COVID-19 declaration under Section 564(b)(1) of the Act, 21 U.S.C. section 360bbb-3(b)(1), unless the authorization is terminated or revoked.  Performed at [facility], [place] [address], [place], [place] [address]      Labs: BNP (last 3 results) No results for input(s):  BNP in the last 8760 hours. Basic Metabolic Panel: Recent Labs  Lab 01/13/21 0903  01/13/21 1124 01/14/21 0553 01/15/21 0553 01/16/21 0520 01/17/21 0450 01/18/21 0605  NA 144   < > 135 139 139 139 140  K 2.7*   < > 3.5 3.0* 3.8 3.4* 3.2*  CL 109   < > 101 104 106 107 106  CO2 17*   < > 23 23 23 22 25   GLUCOSE 64*   < > 98 93 94 94 97  BUN 6*   < > 7* <5* 5* <5* 8  CREATININE 0.30*   < > 0.57 0.44 0.54 0.41* 0.46  CALCIUM 5.9*   < > 8.5* 9.0 9.4 8.9 9.0  MG 1.6*  --  2.3 1.8  --   --   --   PHOS 2.6  --  3.6 3.2  --   --   --    < > = values in this interval not displayed.   Liver Function Tests: Recent Labs  Lab 01/14/21 0553  AST 22  ALT 11  ALKPHOS 85  BILITOT 1.5*  PROT 6.1*  ALBUMIN 3.5   No results for input(s): LIPASE, AMYLASE in the last 168 hours. No results for input(s): AMMONIA in the last 168 hours. CBC: Recent Labs  Lab 01/13/21 0324 01/13/21 0334 01/13/21 0903 01/14/21 0553 01/15/21 0553 01/18/21 0605  WBC 4.0  --  2.2* 3.6* 3.7* 4.0  NEUTROABS 1.4*  --   --  1.5*  --   --   HGB 14.4 15.0 10.1* 12.3 13.3 12.1  HCT 40.3 44.0 28.5* 35.3* 38.3 34.8*  MCV 89.8  --  91.6 91.9 93.2 93.5  PLT 359  --  215 270 244 192   Cardiac Enzymes: No results for input(s): CKTOTAL, CKMB, CKMBINDEX, TROPONINI in the last 168 hours. BNP: Invalid input(s): POCBNP CBG: Recent Labs  Lab 01/15/21 1623 01/16/21 0548 01/17/21 0441 01/17/21 0738 01/18/21 0453  GLUCAP 98 99 94 89 102*   D-Dimer No results for input(s): DDIMER in the last 72 hours. Hgb A1c No results for input(s): HGBA1C in the last 72 hours. Lipid Profile No results for input(s): CHOL, HDL, LDLCALC, TRIG, CHOLHDL, LDLDIRECT in the last 72 hours. Thyroid function studies No results for input(s): TSH, T4TOTAL, T3FREE, THYROIDAB in the last 72 hours.  Invalid input(s): FREET3 Anemia work up Recent Labs    01/16/21 0832  VITAMINB12 740  FOLATE 4.7*   Urinalysis    Component Value Date/Time   COLORURINE COLORLESS (A) 01/13/2021 0439   APPEARANCEUR CLEAR 01/13/2021 0439    LABSPEC 1.009 01/13/2021 0439   LABSPEC 1.020 11/11/2020 1657   PHURINE 5.0 01/13/2021 0439   GLUCOSEU NEGATIVE 01/13/2021 0439   HGBUR NEGATIVE 01/13/2021 [place] 01/13/2021 0439   BILIRUBINUR negative 11/11/2020 1657   BILIRUBINUR n 04/13/2017 [place] 01/13/2021 0439   PROTEINUR NEGATIVE 01/13/2021 0439   UROBILINOGEN negative (A) 04/13/2017 0932   UROBILINOGEN 1.0 05/01/2012 1123   NITRITE NEGATIVE 01/13/2021 0439   LEUKOCYTESUR NEGATIVE 01/13/2021 0439   Sepsis Labs Invalid input(s): PROCALCITONIN,  WBC,  LACTICIDVEN Microbiology Recent Results (from the past 240 hour(s))  Blood culture (routine x 2)     Status: None   Collection Time: 01/13/21  5:30 AM   Specimen: Bronchoalveolar Lavage; Blood  Result Value Ref Range Status   Specimen Description   Final    BRONCHIAL ALVEOLAR LAVAGE RIGHT ANTECUBITAL Performed at [facility], [place] [address] [place], [place] [address]    Special Requests   Final    BOTTLES DRAWN AEROBIC AND ANAEROBIC Blood Culture results may not be optimal due to an inadequate volume of blood received in culture bottles Performed at [place] [address] [place], [place] [address]    Culture   Final    NO GROWTH 5 DAYS Performed at [facility], [place] [address], [place], [place] [address]    Report Status 01/18/2021 FINAL  Final  Blood culture (routine x 2)     Status: None   Collection Time: 01/13/21  5:45 AM   Specimen: Bronchoalveolar Lavage; Blood  Result Value Ref Range Status   Specimen Description   Final    BRONCHIAL ALVEOLAR LAVAGE RIGHT ANTECUBITAL Performed at [place] [address] [place], [place] [address]    Special Requests   Final    BOTTLES DRAWN AEROBIC AND ANAEROBIC Blood Culture results may not be optimal due to an inadequate volume of blood received in culture bottles Performed at [place] [address], [place], [place] [address]    Culture   Final    NO GROWTH 5 DAYS Performed at [facility], [place] [address] [place], [place] [address]    Report Status 01/18/2021 FINAL  Final  Resp Panel by RT-PCR (Flu A&B, Covid) Nasopharyngeal Swab     Status: None   Collection Time: 01/13/21  6:15 AM   Specimen: Nasopharyngeal Swab; Nasopharyngeal(NP) swabs in vial transport medium  Result Value Ref Range Status   SARS Coronavirus 2 by RT PCR NEGATIVE NEGATIVE Final    Comment: (NOTE) SARS-CoV-2 target nucleic acids are NOT DETECTED.  The SARS-CoV-2 RNA is generally detectable in upper respiratory specimens during the acute phase of infection. The lowest concentration of SARS-CoV-2 viral copies this assay can detect is 138 copies/mL. A negative result does not preclude SARS-Cov-2 infection and should not be used as the sole basis for treatment or other patient management decisions. A negative result may occur with  improper specimen collection/handling, submission of specimen other than nasopharyngeal swab, presence of viral mutation(s) within the areas targeted by this assay, and inadequate number of viral copies(<138 copies/mL). A negative result must be combined with clinical observations, patient history, and epidemiological information. The expected result is Negative.  Fact Sheet for Patients:  EntrepreneurPulse.com.au  Fact Sheet for Healthcare Providers:  IncredibleEmployment.be  This test is no t yet approved or cleared by the [place] FDA and  has been authorized for detection and/or diagnosis of SARS-CoV-2 by FDA under an Emergency Use Authorization (EUA). This EUA will remain  in effect (meaning this test can be used) for the duration of the COVID-19 declaration under Section 564(b)(1) of the Act, 21 U.S.C.section 360bbb-3(b)(1), unless the authorization is terminated  or revoked sooner.        Influenza A by PCR NEGATIVE NEGATIVE Final   Influenza B by PCR NEGATIVE NEGATIVE Final    Comment: (NOTE) The Xpert Xpress SARS-CoV-2/FLU/RSV plus assay is intended as an aid in the diagnosis of influenza from Nasopharyngeal swab specimens and should not be used as a sole basis for treatment. Nasal washings and aspirates are unacceptable for Xpert Xpress SARS-CoV-2/FLU/RSV testing.  Fact Sheet for Patients: EntrepreneurPulse.com.au  Fact Sheet for Healthcare Providers: IncredibleEmployment.be  This test is not yet approved or cleared by the [place] FDA and has been authorized for detection and/or diagnosis  of SARS-CoV-2 by FDA under an Emergency Use Authorization (EUA). This EUA will remain in effect (meaning this test can be used) for the duration of the COVID-19 declaration under Section 564(b)(1) of the Act, 21 U.S.C. section 360bbb-3(b)(1), unless the authorization is terminated or revoked.  Performed at [facility], [place] [address], [place], [place] [address]      Time coordinating discharge: Over 30 minutes  SIGNED:   Sovereign Ramiro J [place] DO  [facility] 01/18/2021, 12:04 PM

## 2021-01-20 ENCOUNTER — Other Ambulatory Visit: Payer: Self-pay

## 2021-01-20 ENCOUNTER — Ambulatory Visit (INDEPENDENT_AMBULATORY_CARE_PROVIDER_SITE_OTHER): Payer: Medicare Other

## 2021-01-20 ENCOUNTER — Ambulatory Visit (INDEPENDENT_AMBULATORY_CARE_PROVIDER_SITE_OTHER): Payer: Medicare Other | Admitting: Podiatry

## 2021-01-20 DIAGNOSIS — M2021 Hallux rigidus, right foot: Secondary | ICD-10-CM | POA: Diagnosis not present

## 2021-01-20 DIAGNOSIS — Z9889 Other specified postprocedural states: Secondary | ICD-10-CM

## 2021-01-21 LAB — PROTEIN ELECTROPHORESIS, SERUM
A/G Ratio: 1.3 (ref 0.7–1.7)
Albumin ELP: 3.7 g/dL (ref 2.9–4.4)
Alpha-1-Globulin: 0.2 g/dL (ref 0.0–0.4)
Alpha-2-Globulin: 0.7 g/dL (ref 0.4–1.0)
Beta Globulin: 1.3 g/dL (ref 0.7–1.3)
Gamma Globulin: 0.6 g/dL (ref 0.4–1.8)
Globulin, Total: 2.8 g/dL (ref 2.2–3.9)
Total Protein ELP: 6.5 g/dL (ref 6.0–8.5)

## 2021-01-21 NOTE — Progress Notes (Signed)
[facility] Note  01/23/2021     CHIEF COMPLAINT Patient presents for Retina Follow Up   HISTORY OF PRESENT ILLNESS: Kathy [name] is a 70 y.o. female who presents to the clinic today for:    HPI    Retina Follow Up    Patient presents with  Other.  In right eye.  This started years ago.  Severity is moderate.  Duration of 6 months.  Since onset it is stable.  I, the attending physician,  performed the HPI with the patient and updated documentation appropriately.          Comments    70 y/o female pt here for 6 mo f/u for retinoschisis/focal RD OD.  No change in [place] OU.  Denies pain, floaters, but reports intermittent FOL OU.  Restasis BID OU.       Last edited by Bernarda Caffey, MD on 01/23/2021  8:50 AM. (History)    pt states for the past month, she has been seeing shadows that look like people, but no one is there, she got new glasses, but states she cannot see out of them  Referring physician: Denita Lung, MD [place],  [place] [address]  HISTORICAL INFORMATION:   Selected notes from the MEDICAL RECORD NUMBER Referred by Dr. Gevena Cotton for concern of central areolar choroidal dystrophy LEE: 05.16.19 (MFrederico Hamman) [BCVA: OD: 20/30-2 OS: CF] Ocular Hx-Central areolar choroidal dystrophy, Macular Degeneration, exotropia OS, cataracts OU, dry eyes PMH-HTN, Fibromyalgia     CURRENT MEDICATIONS: Current Outpatient Medications (Ophthalmic Drugs)  Medication Sig  . cycloSPORINE (RESTASIS) 0.05 % ophthalmic emulsion Place 1 drop into both eyes 2 (two) times daily.   No current facility-administered medications for this visit. (Ophthalmic Drugs)   Current Outpatient Medications (Other)  Medication Sig  . acetaminophen (TYLENOL) 500 MG tablet Take 2 tablets (1,000 mg total) by mouth every 8 (eight) hours as needed for moderate pain.  Marland Kitchen docusate sodium (COLACE) 100 MG capsule Take 100 mg by mouth 2 (two) times daily as  needed for mild constipation.  . folic acid (FOLVITE) 1 MG tablet Take 1 tablet (1 mg total) by mouth daily.  . Multiple Vitamins-Minerals (PRESERVISION AREDS 2) CAPS Take 1 capsule by mouth 2 (two) times a day.  . ondansetron (ZOFRAN) 4 MG tablet Take 1 tablet (4 mg total) by mouth every 8 (eight) hours as needed for nausea or vomiting.   No current facility-administered medications for this visit. (Other)   REVIEW OF SYSTEMS: ROS    Positive for: Gastrointestinal, Musculoskeletal, Cardiovascular, Eyes   Negative for: Constitutional, Neurological, Skin, Genitourinary, HENT, Endocrine, Respiratory, Psychiatric, Allergic/Imm, Heme/Lymph   Last edited by Matthew Folks, COA on 01/23/2021  8:29 AM. (History)     ALLERGIES Allergies  Allergen Reactions  . Lortab [Hydrocodone-Acetaminophen] Nausea And Vomiting  . Latex Other (See Comments)    Pt unsure of reaction!   PAST MEDICAL HISTORY Past Medical History:  Diagnosis Date  . Chronic diastolic CHF (congestive heart failure) ([place]) 01/13/2021  . Exotropia of left eye   . Fibromyalgia   . History of uterine leiomyoma   . Hypertension   . Macular degeneration   . OA (osteoarthritis)   . Overactive bladder   . Sarcoidosis   . Wears glasses    Past Surgical History:  Procedure Laterality Date  . ABDOMINAL HYSTERECTOMY  2013  . ADJUSTABLE SUTURE MANIPULATION Left 04/10/2015   Procedure: ADJUSTABLE SUTURE MANIPULATION;  Surgeon: Gevena Cotton, MD;  Location: [facility];  Service: Ophthalmology;  Laterality: Left;  . CATARACT EXTRACTION Bilateral 05/2019   Dr. Gershon Crane  . EYE SURGERY Bilateral 05/2019   Cat Sx - Dr. Gershon Crane  . MEDIAN RECTUS REPAIR Left 04/10/2015   Procedure: MEDIAN RECTUS RESECTION LATERAL RECTUS RECESSION,AJUSTABLES SUTURES LEFT EYE;  Surgeon: Gevena Cotton, MD;  [facility];  Service: Ophthalmology;  Laterality: Left;  . TONSILLECTOMY  as child  . TOTAL ABDOMINAL  HYSTERECTOMY W/ BILATERAL SALPINGOOPHORECTOMY  10-14-2010   and TVT midurethral sling and culdoplasty  . TOTAL KNEE ARTHROPLASTY Left 06/15/2019   Procedure: TOTAL KNEE ARTHROPLASTY;  Surgeon: Paralee Cancel, MD;  Location: [facility];  Service: Orthopedics;  Laterality: Left;  70 mins    FAMILY HISTORY Family History  Problem Relation Age of Onset  . Colon cancer Mother   . Diabetes Father   . Diabetes Sister   . Hypertension Sister   . Diabetes Sister   . Hypertension Sister   . Hypertension Sister     SOCIAL HISTORY Social History   Tobacco Use  . Smoking status: Never Smoker  . Smokeless tobacco: Never Used  Vaping Use  . Vaping Use: Never used  Substance Use Topics  . Alcohol use: Yes    Alcohol/week: 1.0 standard drink    Types: 1 Glasses of wine per week    Comment: rare  . Drug use: No         OPHTHALMIC EXAM:  Base Eye Exam    Visual Acuity (Snellen - Linear)      Right Left   Dist cc 20/40 -2 Cf @ 3'   Dist ph cc NI NI   Correction: Glasses       Tonometry (Tonopen, 8:32 AM)      Right Left   Pressure 12 12       Pupils      Dark Light Shape React APD   Right 3 2 Round Minimal None   Left 3 2 Round Minimal None       Visual Fields (Counting fingers)      Left Right    Full Full       Extraocular Movement      Right Left    Full, Ortho Full, Ortho       Neuro/Psych    Oriented x3: Yes   Mood/Affect: Normal       Dilation    Both eyes: 1.0% Mydriacyl, 2.5% Phenylephrine @ 8:32 AM        Slit Lamp and Fundus Exam    Slit Lamp Exam      Right Left   Lids/Lashes Dermatochalasis - upper lid Dermatochalasis - upper lid   Conjunctiva/Sclera mild Melanosis mild Melanosis, 1+ Injection   Cornea Arcus, Well healed temporal cataract wounds Arcus, linear scar at 0430, Well healed temporal cataract wounds, 1+ Punctate epithelial erosions   Anterior Chamber Deep and quiet Deep and quiet   Iris Round and dilated, patches of atrophy at 1130 and  0900 Round and dilated, mild patches of atrophy   Lens PC IOL in good position, mild pigment on optic PC IOL in good position, mild pigment on optic, 1-2+ Posterior capsular opacification   Vitreous Vitreous syneresis, Posterior vitreous detachment, vitreous condensations Vitreous syneresis       Fundus Exam      Right Left   Disc Sharp rim, mild pallor, mild PPP Tilted disc, mild pallor, sharp rim, almost 360  degrees of PPA   C/D Ratio 0.4 0.6   Macula large area of RPE and choroidal atrophy with small central foveal island spared; central island w/ pigment clumping  large area of choroidal and retinal pigment epithelial atrophy extending to nasal side of disc -- stable from prior, Pigment clumping, No heme    Vessels attenuated, Tortuous attenuated, Tortuous   Periphery Attached,  vitreous condensations at 0300 with retinal break and +SRF / focal RD within bed of retinoschisis -- stable with good laser surrounding, ?schisis ST periphery Attached             IMAGING AND PROCEDURES  Imaging and Procedures for @TODAY @  OCT, Retina - OU - Both Eyes       Right Eye Quality was good. Central Foveal Thickness: 183. Progression has been stable. Findings include abnormal foveal contour, inner retinal atrophy, outer retinal atrophy, no SRF, no IRF, retinal drusen  (diffuse macular atrophy with central island - shrinking, stable nasal schisis caught on widefield -- good laser surrounding).   Left Eye Quality was good. Central Foveal Thickness: 197. Progression has been stable. Findings include outer retinal atrophy, inner retinal atrophy, abnormal foveal contour, no IRF, no SRF (Diffuse central atrophy; en face image stable).   Notes *Images captured and stored on drive  Diagnosis / Impression:  Diffuse macular atrophy OU -- Stable from prior OD with central island of non-atrophied retina -- shrinking? Central areolar choroidal dystrophy OU Retinoschisis nasal periphery OD -- caught on  widefield  Clinical management:  See below  Abbreviations: NFP - Normal foveal profile. CME - cystoid macular edema. PED - pigment epithelial detachment. IRF - intraretinal fluid. SRF - subretinal fluid. EZ - ellipsoid zone. ERM - epiretinal membrane. ORA - outer retinal atrophy. ORT - outer retinal tubulation. SRHM - subretinal hyper-reflective material                ASSESSMENT/PLAN:    ICD-10-CM   1. Right retinoschisis  H33.101   2. Retinal detachment, right  H33.21   3. Retinal edema  H35.81 OCT, Retina - OU - Both Eyes  4. 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 Choroidal atrophy of both eyes  H31.103   6. Advanced atrophic nonexudative age-related macular degeneration of both eyes with subfoveal involvement  H35.3134   7. Pseudophakia of both eyes  Z96.1   8. Strabismus  H50.9     1-3. Localized, focal retinal detachment / SRF surrounding small retinal tear at 0330, within retinoschisis cavity, RIGHT EYE  - asymptomatic, but found on exam 5.23.19  - tear located at 0330 OD  - S/P laser retinopexy OD (05.23.19), S/P touch up laser retinopexy OD (06.28.19)  - BCVA: 20/40   - good laser in place  - no change in SRF/schisis-detachment   - f/u in 6 months -- repeat widefield OCT over 0300 schisis cavity  4-6. Severe outer retinal and choroidal atrophy OU - stable today  - likely central areolar choroidal dystrophy -- pt notes declining vision that started in her 20-50s  - pt reports intermittent photopsias -- suspect may be related to progressive degeneration / dystrophy -- stably improved today  - differential also includes age related macular degeneration, non-exudative, both eyes  - The incidence, anatomy, and pathology of dry AMD, risk of progression, and the AREDS and AREDS 2 study including smoking risks discussed with patient.  - continue amsler grid monitoring   - f/u q6 mos  7. Pseudophakia OU  -  s/p CE/IOL OU Gershon Crane, June 2020)  - beautiful  surgeries, doing well  - monitor  8. Strabismus  - under the expert management of Dr. Frederico Hamman  - s/p EOM surgery 04/10/15  - monitor  Ophthalmic Meds Ordered this visit:  No orders of the defined types were placed in this encounter.     Return in about 6 months (around 07/23/2021) for f/u retinoschisis OD, DFE, OCT.  There are no Patient Instructions on file for this visit.  Explained the diagnoses, plan, and follow up with the patient and they expressed understanding.  Patient expressed understanding of the importance of proper follow up care.   This document serves as a record of services personally performed by Gardiner Sleeper, MD, PhD. It was created on their behalf by Leonie Douglas, an ophthalmic technician. The creation of this record is the provider's dictation and/or activities during the visit.    Electronically signed by: Leonie Douglas COA, 01/23/21  12:45 PM   This document serves as a record of services personally performed by Gardiner Sleeper, MD, PhD. It was created on their behalf by San Jetty. Owens Shark, OA an ophthalmic technician. The creation of this record is the provider's dictation and/or activities during the visit.    Electronically signed by: San Jetty. Owens Shark, [place] 02.24.2022 12:45 PM   Gardiner Sleeper, M.D., Ph.D. Diseases & Surgery of the Retina and [place] 01/23/21  I have reviewed the above documentation for accuracy and completeness, and I agree with the above. Gardiner Sleeper, M.D., Ph.D. 01/23/21 12:45 PM  Abbreviations: M myopia (nearsighted); A astigmatism; H hyperopia (farsighted); P presbyopia; Mrx spectacle prescription;  CTL contact lenses; OD right eye; OS left eye; OU both eyes  XT exotropia; ET esotropia; PEK punctate epithelial keratitis; PEE punctate epithelial erosions; DES dry eye syndrome; MGD meibomian gland dysfunction; ATs artificial tears; PFAT's preservative free artificial tears; [place] nuclear sclerotic  cataract; PSC posterior subcapsular cataract; ERM epi-retinal membrane; PVD posterior vitreous detachment; RD retinal detachment; DM diabetes mellitus; DR diabetic retinopathy; NPDR non-proliferative diabetic retinopathy; PDR proliferative diabetic retinopathy; CSME clinically significant macular edema; DME diabetic macular edema; dbh dot blot hemorrhages; CWS cotton wool spot; POAG primary open angle glaucoma; C/D cup-to-disc ratio; HVF humphrey visual field; GVF goldmann visual field; OCT optical coherence tomography; IOP intraocular pressure; BRVO Branch retinal vein occlusion; CRVO central retinal vein occlusion; CRAO central retinal artery occlusion; BRAO branch retinal artery occlusion; RT retinal tear; SB scleral buckle; PPV pars plana vitrectomy; VH Vitreous hemorrhage; PRP panretinal laser photocoagulation; IVK intravitreal kenalog; VMT vitreomacular traction; MH Macular hole;  NVD neovascularization of the disc; NVE neovascularization elsewhere; AREDS age related eye disease study; ARMD age related macular degeneration; POAG primary open angle glaucoma; EBMD epithelial/anterior basement membrane dystrophy; ACIOL anterior chamber intraocular lens; IOL intraocular lens; PCIOL posterior chamber intraocular lens; Phaco/IOL phacoemulsification with intraocular lens placement; [place] photorefractive keratectomy; LASIK laser assisted in situ keratomileusis; HTN hypertension; DM diabetes mellitus; COPD chronic obstructive pulmonary disease

## 2021-01-22 ENCOUNTER — Ambulatory Visit (INDEPENDENT_AMBULATORY_CARE_PROVIDER_SITE_OTHER): Payer: Medicare Other | Admitting: Family Medicine

## 2021-01-22 ENCOUNTER — Encounter: Payer: Self-pay | Admitting: Family Medicine

## 2021-01-22 ENCOUNTER — Other Ambulatory Visit: Payer: Self-pay

## 2021-01-22 VITALS — BP 130/80 | HR 79 | Temp 98.6°F | Wt 165.8 lb

## 2021-01-22 DIAGNOSIS — R55 Syncope and collapse: Secondary | ICD-10-CM

## 2021-01-22 DIAGNOSIS — E871 Hypo-osmolality and hyponatremia: Secondary | ICD-10-CM

## 2021-01-22 NOTE — Patient Instructions (Signed)
Call hospice [phone] and see if they have anybody that can do in person counseling

## 2021-01-22 NOTE — Progress Notes (Signed)
Subjective:    Patient ID: Kathy [name], female    DOB: 1951-06-25, 70 y.o.   MRN: [id number]  HPI She is here for follow-up on recent hospitalization.  She had difficulty with syncopal episode.  She was also noted to be hyponatremic.  She was given an extensive work-up for the syncopal episodes and has been referred to cardiology which she plans on seeing March 7 and neurology on March 25.  At the present time she has had no syncopal episodes, is not taking any of her blood pressure medications and using just OTC supplements including folic acid.  She has no particular complaints.   Review of Systems     Objective:   Physical Exam Alert and in no distress.  Cardiac exam shows regular rhythm without murmurs or gallops.  Lungs are clear to auscultation.  The hospital record including emergency room visit, discharge summary lab and x-rays was reviewed.       Assessment & Plan:  Syncope, unspecified syncope type  Hyponatremia - Plan: Comprehensive metabolic panel C-Met ordered. Reviewed her medications with her and went over her appointment with cardiology and neurology.

## 2021-01-23 ENCOUNTER — Encounter (INDEPENDENT_AMBULATORY_CARE_PROVIDER_SITE_OTHER): Payer: Self-pay | Admitting: Ophthalmology

## 2021-01-23 ENCOUNTER — Ambulatory Visit (INDEPENDENT_AMBULATORY_CARE_PROVIDER_SITE_OTHER): Payer: Medicare Other | Admitting: Ophthalmology

## 2021-01-23 DIAGNOSIS — H3122 Choroidal dystrophy (central areolar) (generalized) (peripapillary): Secondary | ICD-10-CM | POA: Diagnosis not present

## 2021-01-23 DIAGNOSIS — H3321 Serous retinal detachment, right eye: Secondary | ICD-10-CM

## 2021-01-23 DIAGNOSIS — Z961 Presence of intraocular lens: Secondary | ICD-10-CM | POA: Diagnosis not present

## 2021-01-23 DIAGNOSIS — H509 Unspecified strabismus: Secondary | ICD-10-CM | POA: Diagnosis not present

## 2021-01-23 DIAGNOSIS — H353134 Nonexudative age-related macular degeneration, bilateral, advanced atrophic with subfoveal involvement: Secondary | ICD-10-CM | POA: Diagnosis not present

## 2021-01-23 DIAGNOSIS — M2021 Hallux rigidus, right foot: Secondary | ICD-10-CM | POA: Diagnosis not present

## 2021-01-23 DIAGNOSIS — H31103 Choroidal degeneration, unspecified, bilateral: Secondary | ICD-10-CM | POA: Diagnosis not present

## 2021-01-23 DIAGNOSIS — H3581 Retinal edema: Secondary | ICD-10-CM

## 2021-01-23 DIAGNOSIS — R262 Difficulty in walking, not elsewhere classified: Secondary | ICD-10-CM | POA: Diagnosis not present

## 2021-01-23 DIAGNOSIS — I10 Essential (primary) hypertension: Secondary | ICD-10-CM | POA: Diagnosis not present

## 2021-01-23 DIAGNOSIS — H33101 Unspecified retinoschisis, right eye: Secondary | ICD-10-CM | POA: Diagnosis not present

## 2021-01-23 DIAGNOSIS — M19071 Primary osteoarthritis, right ankle and foot: Secondary | ICD-10-CM | POA: Diagnosis not present

## 2021-01-23 DIAGNOSIS — Z4789 Encounter for other orthopedic aftercare: Secondary | ICD-10-CM | POA: Diagnosis not present

## 2021-01-23 LAB — COMPREHENSIVE METABOLIC PANEL
ALT: 13 IU/L (ref 0–32)
AST: 22 IU/L (ref 0–40)
Albumin/Globulin Ratio: 1.7 (ref 1.2–2.2)
Albumin: 4.5 g/dL (ref 3.8–4.8)
Alkaline Phosphatase: 86 IU/L (ref 44–121)
BUN/Creatinine Ratio: 13 (ref 12–28)
BUN: 8 mg/dL (ref 8–27)
Bilirubin Total: 0.6 mg/dL (ref 0.0–1.2)
CO2: 22 mmol/L (ref 20–29)
Calcium: 9.4 mg/dL (ref 8.7–10.3)
Chloride: 102 mmol/L (ref 96–106)
Creatinine, Ser: 0.63 mg/dL (ref 0.57–1.00)
GFR calc Af Amer: 106 mL/min/{1.73_m2} (ref >59)
GFR calc non Af Amer: 92 mL/min/{1.73_m2} (ref >59)
Globulin, Total: 2.6 g/dL (ref 1.5–4.5)
Glucose: 91 mg/dL (ref 65–99)
Potassium: 3.9 mmol/L (ref 3.5–5.2)
Sodium: 139 mmol/L (ref 134–144)
Total Protein: 7.1 g/dL (ref 6.0–8.5)

## 2021-01-25 NOTE — Progress Notes (Signed)
Subjective: Kathy [name] is a 70 y.o. is seen today in office s/p right 1st MTPJ arthrodesis preformed on 12/18/2020. Since I last saw her she has been into the hospital again for syncope. She states that her blood pressure medicines have been adjusted. She is not sure she had her foot when she fell but no significant increase in pain or swelling that she reports. She still in the cam boot. Currently denies any fevers, chills, nausea, vomiting.  No calf pain, chest pain, shortness of breath.    Objective: General: No acute distress, AAOx3  DP/PT pulses palpable 2/4, CRT < 3 sec to all digits.  Protective sensation intact. Motor function intact.  RIGHT foot: Incision is well coapted without any evidence of dehiscence and scars forming well. There is no dehiscence or signs of infection. There is no erythema or warmth there is no drainage or pus. Mild edema. There is no significant tenderness palpation on the surgical site. Toes in rectus position the arthrodesis site appears to be stable. No other open lesions or pre-ulcerative lesions.  No pain with calf compression, swelling, warmth, erythema.   Assessment and Plan:  Status post right foot surgery, doing well with no complications   -Treatment options discussed including all alternatives, risks, and complications -X-rays obtained reviewed. Hardware intact status post first metatarsal phalangeal joint fusion but any complicating factors and there is increased consolidation noted. -She can continue with to wash the incision with soap and water daily. Dry thoroughly and apply a small amount of antibiotic ointment and a dressing which I did today as well. She can also start to transition to partial weightbearing in the cam boot. Continue ice elevation.   Return in about 2 weeks (around 02/03/2021). Repeat x-rays  Trula Slade DPM

## 2021-01-28 LAB — VITAMIN B1: Vitamin B1 (Thiamine): 72.1 nmol/L (ref 66.5–200.0)

## 2021-01-29 DIAGNOSIS — R262 Difficulty in walking, not elsewhere classified: Secondary | ICD-10-CM | POA: Diagnosis not present

## 2021-01-29 DIAGNOSIS — M2021 Hallux rigidus, right foot: Secondary | ICD-10-CM | POA: Diagnosis not present

## 2021-01-29 DIAGNOSIS — Z4789 Encounter for other orthopedic aftercare: Secondary | ICD-10-CM | POA: Diagnosis not present

## 2021-01-29 DIAGNOSIS — M19071 Primary osteoarthritis, right ankle and foot: Secondary | ICD-10-CM | POA: Diagnosis not present

## 2021-01-29 DIAGNOSIS — I951 Orthostatic hypotension: Secondary | ICD-10-CM | POA: Diagnosis not present

## 2021-01-29 DIAGNOSIS — I10 Essential (primary) hypertension: Secondary | ICD-10-CM | POA: Diagnosis not present

## 2021-01-29 DIAGNOSIS — D860 Sarcoidosis of lung: Secondary | ICD-10-CM | POA: Diagnosis not present

## 2021-02-03 ENCOUNTER — Ambulatory Visit: Payer: Medicare Other | Admitting: Internal Medicine

## 2021-02-03 ENCOUNTER — Other Ambulatory Visit: Payer: Self-pay

## 2021-02-03 ENCOUNTER — Encounter: Payer: Self-pay | Admitting: *Deleted

## 2021-02-03 ENCOUNTER — Ambulatory Visit (INDEPENDENT_AMBULATORY_CARE_PROVIDER_SITE_OTHER): Payer: Medicare Other

## 2021-02-03 ENCOUNTER — Encounter: Payer: Self-pay | Admitting: Internal Medicine

## 2021-02-03 VITALS — BP 160/80 | HR 97 | Ht 62.5 in | Wt 172.0 lb

## 2021-02-03 DIAGNOSIS — I10 Essential (primary) hypertension: Secondary | ICD-10-CM

## 2021-02-03 DIAGNOSIS — D860 Sarcoidosis of lung: Secondary | ICD-10-CM

## 2021-02-03 DIAGNOSIS — R55 Syncope and collapse: Secondary | ICD-10-CM

## 2021-02-03 DIAGNOSIS — I5032 Chronic diastolic (congestive) heart failure: Secondary | ICD-10-CM

## 2021-02-03 DIAGNOSIS — I700 Atherosclerosis of aorta: Secondary | ICD-10-CM

## 2021-02-03 MED ORDER — AMLODIPINE BESYLATE 5 MG PO TABS: 5.0000 mg | ORAL_TABLET | Freq: Every day | ORAL | 3 refills | Status: DC

## 2021-02-03 NOTE — Patient Instructions (Addendum)
Medication Instructions:  START: amlodipine 5mg  by mouth daily  *If you need a refill on your cardiac medications before your next appointment, please call your pharmacy*   Lab Work: NONE If you have labs (blood work) drawn today and your tests are completely normal, you will receive your results only by: Marland Kitchen MyChart Message (if you have MyChart) OR . A paper copy in the mail If you have any lab test that is abnormal or we need to change your treatment, we will call you to review the results.   Testing/Procedures: Your physician has requested that you wear a 14 day heart monitor.    Follow-Up: At [facility], you and your health needs are our priority.  As part of our continuing mission to provide you with exceptional heart care, we have created designated Provider Care Teams.  These Care Teams include your primary Cardiologist (physician) and Advanced Practice Providers (APPs -  Physician Assistants and Nurse Practitioners) who all work together to provide you with the care you need, when you need it.  We recommend signing up for the patient portal called "MyChart".  Sign up information is provided on this After Visit Summary.  MyChart is used to connect with patients for Virtual Visits (Telemedicine).  Patients are able to view lab/test results, encounter notes, upcoming appointments, etc.  Non-urgent messages can be sent to your provider as well.   To learn more about what you can do with MyChart, go to [email or web address].    Your next appointment:   3-5 month(s)  The format for your next appointment:   In Person  Provider:   You may see Gasper Sells, MD or one of the following Advanced Practice Providers on your designated Care Team:    Melina Copa, PA-C  Ermalinda Barrios, PA-C   [place] Monitor Instructions   Your physician has requested you wear your ZIO patch monitor_14_days.   This is a single patch monitor.  [employer] supplies one patch monitor per  enrollment.  Additional stickers are not available.   Please do not apply patch if you will be having a Nuclear Stress Test, Echocardiogram, Cardiac CT, MRI, or Chest Xray during the time frame you would be wearing the monitor. The patch cannot be worn during these tests.  You cannot remove and re-apply the ZIO XT patch monitor.   Your ZIO patch monitor will be sent USPS Priority mail from [facility] directly to your home address. The monitor may also be mailed to a PO BOX if home delivery is not available.   It may take 3-5 days to receive your monitor after you have been enrolled.   Once you have received you monitor, please review enclosed instructions.  Your monitor has already been registered assigning a specific monitor serial # to you.   Applying the monitor   Shave hair from upper left chest.   Hold abrader disc by orange tab.  Rub abrader in 40 strokes over left upper chest as indicated in your monitor instructions.   Clean area with 4 enclosed alcohol pads .  Use all pads to assure are is cleaned thoroughly.  Let dry.   Apply patch as indicated in monitor instructions.  Patch will be place under collarbone on left side of chest with arrow pointing upward.   Rub patch adhesive wings for 2 minutes.Remove white label marked "1".  Remove white label marked "2".  Rub patch adhesive wings for 2 additional minutes.   While looking in a mirror,  press and release button in center of patch.  A small green light will flash 3-4 times .  This will be your only indicator the monitor has been turned on.     Do not shower for the first 24 hours.  You may shower after the first 24 hours.   Press button if you feel a symptom. You will hear a small click.  Record Date, Time and Symptom in the Patient Log Book.   When you are ready to remove patch, follow instructions on last 2 pages of Patient Log Book.  Stick patch monitor onto last page of Patient Log Book.   Place Patient Log Book in  [place] box.  Use locking tab on box and tape box closed securely.  The [employer] and [employer] has [employer] on it.  Please place in mailbox as soon as possible.  Your physician should have your test results approximately 7 days after the monitor has been mailed back to [facility].   Call [place] at [phone] if you have questions regarding your ZIO XT patch monitor.  Call them immediately if you see an orange light blinking on your monitor.   If your monitor falls off in less than 4 days contact our Monitor department at [phone].  If your monitor becomes loose or falls off after 4 days call [employer] at [phone] for suggestions on securing your monitor.

## 2021-02-03 NOTE — Progress Notes (Signed)
Cardiology Office Note:    Date:  02/03/2021   ID:  Kathy [name], DOB December 21, 1950, MRN [id number]  PCP:  Denita Lung, MD   [facility]  Cardiologist:  No primary care provider on file.  Advanced Practice Provider:  No care team member to display Electrophysiologist:  None       CC: Syncope Consulted for the evaluation of HFpEF and ECG changes at the behest of Denita Lung, MD   History of Present Illness:    Kathy [name] is a 70 y.o. female with a hx of sarcoidosis, aortic atherosclerosis and HFpEF who presents for evaluation.  Patient notes that she is feeling better after passing out and being hospitalized.  On Valentine's Day, went up to go to the bathroom.  When she stood up and felt fine.  Woke up and was in the ambulance.  In the hospital, several medication were stopped.  Has resolution of her hyponatremia with stopping diuretic and IVF.  No syncope here but notes in the hospital near syncope with low BP after medication.  Neuro work up benign in original work up for syncope (12/21), had been planned for outpatient heart monitoring.    Has had no chest pain, chest pressure, chest tightness, chest stinging.   Patient exertion notable for being able to go walking and biking without and feels no symptoms.  Notes a history of biopsy positive pulmonary sarcoidosis.   Notes DOE but not resting SOB.  No palpitations.  Ambulatory SBP 130.   Past Medical History:  Diagnosis Date  . Chronic diastolic CHF (congestive heart failure) ([place]) 01/13/2021  . Exotropia of left eye   . Fibromyalgia   . History of uterine leiomyoma   . Hypertension   . Macular degeneration   . OA (osteoarthritis)   . Overactive bladder   . Sarcoidosis   . Wears glasses     Past Surgical History:  Procedure Laterality Date  . ABDOMINAL HYSTERECTOMY  2013  . ADJUSTABLE SUTURE MANIPULATION Left 04/10/2015   Procedure: ADJUSTABLE SUTURE MANIPULATION;  Surgeon: Gevena Cotton, MD;  Location: [facility];  Service: Ophthalmology;  Laterality: Left;  . CATARACT EXTRACTION Bilateral 05/2019   Dr. Gershon Crane  . EYE SURGERY Bilateral 05/2019   Cat Sx - Dr. Gershon Crane  . MEDIAN RECTUS REPAIR Left 04/10/2015   Procedure: MEDIAN RECTUS RESECTION LATERAL RECTUS RECESSION,AJUSTABLES SUTURES LEFT EYE;  Surgeon: Gevena Cotton, MD;  Location: [facility];  Service: Ophthalmology;  Laterality: Left;  . TONSILLECTOMY  as child  . TOTAL ABDOMINAL HYSTERECTOMY W/ BILATERAL SALPINGOOPHORECTOMY  10-14-2010   and TVT midurethral sling and culdoplasty  . TOTAL KNEE ARTHROPLASTY Left 06/15/2019   Procedure: TOTAL KNEE ARTHROPLASTY;  Surgeon: Paralee Cancel, MD;  Location: [facility];  Service: Orthopedics;  Laterality: Left;  70 mins    Current Medications: Current Meds  Medication Sig  . acetaminophen (TYLENOL) 500 MG tablet Take 2 tablets (1,000 mg total) by mouth every 8 (eight) hours as needed for moderate pain.  Marland Kitchen amLODipine (NORVASC) 5 MG tablet Take 1 tablet (5 mg total) by mouth daily.  . cycloSPORINE (RESTASIS) 0.05 % ophthalmic emulsion Place 1 drop into both eyes 2 (two) times daily.  Marland Kitchen docusate sodium (COLACE) 100 MG capsule Take 100 mg by mouth 2 (two) times daily as needed for mild constipation.  . folic acid (FOLVITE) 1 MG tablet Take 1 tablet (1 mg total) by mouth daily.  . Multiple Vitamins-Minerals (PRESERVISION  AREDS 2) CAPS Take 1 capsule by mouth 2 (two) times a day.  . ondansetron (ZOFRAN) 4 MG tablet Take 1 tablet (4 mg total) by mouth every 8 (eight) hours as needed for nausea or vomiting.     Allergies:   Lortab [hydrocodone-acetaminophen] and Latex   Social History   Socioeconomic History  . Marital status: Married    Spouse name: Not on file  . Number of children: Not on file  . Years of education: Not on file  . Highest education level: Not on file  Occupational History  . Not on file  Tobacco Use  . Smoking status:  Never Smoker  . Smokeless tobacco: Never Used  Vaping Use  . Vaping Use: Never used  Substance and Sexual Activity  . Alcohol use: Yes    Alcohol/week: 1.0 standard drink    Types: 1 Glasses of wine per week    Comment: rare  . Drug use: No  . Sexual activity: Yes  Other Topics Concern  . Not on file  Social History Narrative  . Not on file   Social Determinants of Health   Financial Resource Strain: Not on file  Food Insecurity: Not on file  Transportation Needs: Not on file  Physical Activity: Not on file  Stress: Not on file  Social Connections: Not on file     Family History: The patient's family history includes Colon cancer in her mother; Diabetes in her father, sister, and sister; Hypertension in her sister, sister, and sister. History of coronary artery disease notable for no members. History of heart failure notable for father. History of arrhythmia notable for no members. No history of cardiac sarcoidosis.  ROS:   Please see the history of present illness.     All other systems reviewed and are negative.  EKGs/Labs/Other Studies Reviewed:    The following studies were reviewed today:  EKG:   01/14/21: Sinus Tachycardia 108 no PR prolongation LVH QTc 480  Transthoracic Echocardiogram: Date: 02/03/2021 Results: No Sarcoid related WMA 1. Left ventricular ejection fraction, by estimation, is 55 to 60%. The  left ventricle has normal function. The left ventricle has no regional  wall motion abnormalities. Left ventricular diastolic parameters are  consistent with Grade I diastolic  dysfunction (impaired relaxation). Elevated left ventricular end-diastolic  pressure. The average left ventricular global longitudinal strain is -19.9  %. The global longitudinal strain is normal.  2. Right ventricular systolic function is normal. The right ventricular  size is normal. There is normal pulmonary artery systolic pressure.  3. The mitral valve is normal in  structure. Trivial mitral valve  regurgitation. No evidence of mitral stenosis.  4. The aortic valve is tricuspid. There is mild calcification of the  aortic valve. There is mild thickening of the aortic valve. Aortic valve  regurgitation is not visualized. No aortic stenosis is present.  5. The inferior vena cava is normal in size with greater than 50%  respiratory variability, suggesting right atrial pressure of 3 mmHg.   NonCardiac CT : Date: 01/13/21 Personally Reviewed Results: Aortic Atherosclerosis without CAC  Recent Labs: 01/15/2021: Magnesium 1.8 01/18/2021: Hemoglobin 12.1; Platelets 192 01/22/2021: ALT 13; BUN 8; Creatinine, Ser 0.63; Potassium 3.9; Sodium 139  Recent Lipid Panel    Component Value Date/Time   CHOL 283 (H) 09/02/2020 1046   TRIG 76 09/02/2020 1046   HDL 144 09/02/2020 1046   CHOLHDL 2.0 09/02/2020 1046   CHOLHDL 3.8 04/13/2017 0942   VLDL 28 04/13/2017 0942  LDLCALC 127 (H) 09/02/2020 1046   SPEP No M Spike Unable to see UPEP  Risk Assessment/Calculations:     N/A  Physical Exam:    VS:  BP (!) 160/80   Pulse 97   Ht 5' 2.5" (1.588 m)   Wt 172 lb (78 kg)   SpO2 96%   BMI 30.96 kg/m     Orthostatic Vitals: (R arm manual) Sitting:  BP 160/90 Standing:  BP 160/100   Wt Readings from Last 3 Encounters:  02/03/21 172 lb (78 kg)  01/22/21 165 lb 12.8 oz (75.2 kg)  01/18/21 176 lb 12.9 oz (80.2 kg)    GEN:  Well nourished, well developed in no acute distress HEENT: Normal NECK: No JVD; No carotid bruits LYMPHATICS: No lymphadenopathy CARDIAC: RRR, no murmurs, rubs, gallops RESPIRATORY:  Clear to auscultation without rales, wheezing or rhonchi  ABDOMEN: Soft, non-tender, non-distended MUSCULOSKELETAL:  No edema; No deformity  SKIN: Warm and dry NEUROLOGIC:  Alert and oriented x 3 PSYCHIATRIC:  Normal affect   ASSESSMENT:    1. Syncope, unspecified syncope type   2. Chronic diastolic CHF (congestive heart failure) ([place])   3.  Pulmonary sarcoidosis ([place])   4. Essential hypertension   5. Aortic atherosclerosis ([facility])    PLAN:    In order of problems listed above:  Heart Failure Preserved Ejection Fraction  HTN Hypnatremia - NYHA class I, Stage A, euvolemic, etiology from HTN - Diuretic regimen:none; caution in the setting of recent hypovolemic hyponatremia - Discussed the importance of fluid restriction of < 2 L, salt restriction, and checking daily weights  - will return amlodipine 5 mg Po Daily and discussed BP monitoring  Cardiac Sarcoidosis Eval - With extracardiac sarcoidosis - With/t biopsy - With syncope - without  complete left or right bundle branch block; presence of unexplained pathologic Q waves in two or more leads; sustained first-, second-, or third-degree AV block; or sustained or nonsustained VT  - will get 2 week ZioPatch - without  regional wall motion abnormality, ventricular aneurysm, basal septal thinning, or depressed LVEF.  - will defer cardiac imaging unless significant issues on CMR  Hyperlipidemia (mixed with Aortic Atherosclerosis) -LDL goal less than 70 At next visit will discuss statin management - gave education on dietary changes  Medication Adjustments/Labs and Tests Ordered: Current medicines are reviewed at length with the patient today.  Concerns regarding medicines are outlined above.  Orders Placed This Encounter  Procedures  . LONG TERM MONITOR (3-14 DAYS)   Meds ordered this encounter  Medications  . amLODipine (NORVASC) 5 MG tablet    Sig: Take 1 tablet (5 mg total) by mouth daily.    Dispense:  90 tablet    Refill:  3    Patient Instructions  Medication Instructions:  START: amlodipine 5mg  by mouth daily  *If you need a refill on your cardiac medications before your next appointment, please call your pharmacy*   Lab Work: NONE If you have labs (blood work) drawn today and your tests are completely normal, you will receive your results only  by: Marland Kitchen MyChart Message (if you have MyChart) OR . A paper copy in the mail If you have any lab test that is abnormal or we need to change your treatment, we will call you to review the results.   Testing/Procedures: Your physician has requested that you wear a 14 day heart monitor.    Follow-Up: At [facility], you and your health needs are our priority.  As  part of our continuing mission to provide you with exceptional heart care, we have created designated Provider Care Teams.  These Care Teams include your primary Cardiologist (physician) and Advanced Practice Providers (APPs -  Physician Assistants and Nurse Practitioners) who all work together to provide you with the care you need, when you need it.  We recommend signing up for the patient portal called "MyChart".  Sign up information is provided on this After Visit Summary.  MyChart is used to connect with patients for Virtual Visits (Telemedicine).  Patients are able to view lab/test results, encounter notes, upcoming appointments, etc.  Non-urgent messages can be sent to your provider as well.   To learn more about what you can do with MyChart, go to [email or web address].    Your next appointment:   3-5 month(s)  The format for your next appointment:   In Person  Provider:   You may see Gasper Sells, MD or one of the following Advanced Practice Providers on your designated Care Team:    Melina Copa, PA-C  Ermalinda Barrios, PA-C   [place] Monitor Instructions   Your physician has requested you wear your ZIO patch monitor_14_days.   This is a single patch monitor.  [employer] supplies one patch monitor per enrollment.  Additional stickers are not available.   Please do not apply patch if you will be having a Nuclear Stress Test, Echocardiogram, Cardiac CT, MRI, or Chest Xray during the time frame you would be wearing the monitor. The patch cannot be worn during these tests.  You cannot remove and re-apply the ZIO XT  patch monitor.   Your ZIO patch monitor will be sent USPS Priority mail from [facility] directly to your home address. The monitor may also be mailed to a PO BOX if home delivery is not available.   It may take 3-5 days to receive your monitor after you have been enrolled.   Once you have received you monitor, please review enclosed instructions.  Your monitor has already been registered assigning a specific monitor serial # to you.   Applying the monitor   Shave hair from upper left chest.   Hold abrader disc by orange tab.  Rub abrader in 40 strokes over left upper chest as indicated in your monitor instructions.   Clean area with 4 enclosed alcohol pads .  Use all pads to assure are is cleaned thoroughly.  Let dry.   Apply patch as indicated in monitor instructions.  Patch will be place under collarbone on left side of chest with arrow pointing upward.   Rub patch adhesive wings for 2 minutes.Remove white label marked "1".  Remove white label marked "2".  Rub patch adhesive wings for 2 additional minutes.   While looking in a mirror, press and release button in center of patch.  A small green light will flash 3-4 times .  This will be your only indicator the monitor has been turned on.     Do not shower for the first 24 hours.  You may shower after the first 24 hours.   Press button if you feel a symptom. You will hear a small click.  Record Date, Time and Symptom in the Patient Log Book.   When you are ready to remove patch, follow instructions on last 2 pages of Patient Log Book.  Stick patch monitor onto last page of Patient Log Book.   Place Patient Log Book in [place] box.  Use locking tab on box and  tape box closed securely.  The [employer] has [employer] on it.  Please place in mailbox as soon as possible.  Your physician should have your test results approximately 7 days after the monitor has been mailed back to [facility].   Call [place] at [phone] if you have questions regarding your ZIO XT patch monitor.  Call them immediately if you see an orange light blinking on your monitor.   If your monitor falls off in less than 4 days contact our Monitor department at [phone].  If your monitor becomes loose or falls off after 4 days call [employer] at [phone] for suggestions on securing your monitor.        Signed, Werner Lean, MD  02/03/2021 3:42 PM    [facility]

## 2021-02-03 NOTE — Progress Notes (Signed)
Patient ID: Kathy [name], female   DOB: 03-Jan-1951, 70 y.o.   MRN: [id number] Patient enrolled for [employer] to ship a 14 day ZIO XT long term holter monitor to her home.

## 2021-02-04 ENCOUNTER — Telehealth: Payer: Self-pay | Admitting: Internal Medicine

## 2021-02-04 NOTE — Telephone Encounter (Signed)
Patient called in c/o brief period of chest pain.  She said she consumed a lot of water then had subsequent CP.  She said it was a sharp pain that lasted a couple of minutes.  She expressed that she burped and instantly felt better.  She said her BP was 172/102 during phone call.   She had an OV with Dr. Gasper Sells on 02/03/21 BP 160/80 at visit. She was started on amlodipine 5 mg PO QD.

## 2021-02-04 NOTE — Telephone Encounter (Signed)
Made patient aware of Dr. Oralia Rud recommendations to check BP daily and to give [place] a call with BP readings or send through my chart.  Patient agree to this plan.

## 2021-02-04 NOTE — Telephone Encounter (Signed)
Let's ask her to check in with use about blood pressure after two weeks on the medication: she has had hypotension associated with low blood pressure in the past, so adding too many medications could give her a third episode of syncope.  Werner Lean, MD

## 2021-02-04 NOTE — Telephone Encounter (Signed)
New message:     Patient stated a new medication today and was having a little chest pain early today, but she feels a little better. Please call patient.

## 2021-02-05 DIAGNOSIS — R55 Syncope and collapse: Secondary | ICD-10-CM | POA: Diagnosis not present

## 2021-02-06 ENCOUNTER — Ambulatory Visit (INDEPENDENT_AMBULATORY_CARE_PROVIDER_SITE_OTHER): Payer: Medicare Other | Admitting: Podiatry

## 2021-02-06 ENCOUNTER — Other Ambulatory Visit: Payer: Self-pay

## 2021-02-06 ENCOUNTER — Ambulatory Visit (INDEPENDENT_AMBULATORY_CARE_PROVIDER_SITE_OTHER): Payer: Medicare Other

## 2021-02-06 DIAGNOSIS — R262 Difficulty in walking, not elsewhere classified: Secondary | ICD-10-CM | POA: Diagnosis not present

## 2021-02-06 DIAGNOSIS — M19071 Primary osteoarthritis, right ankle and foot: Secondary | ICD-10-CM | POA: Diagnosis not present

## 2021-02-06 DIAGNOSIS — D860 Sarcoidosis of lung: Secondary | ICD-10-CM | POA: Diagnosis not present

## 2021-02-06 DIAGNOSIS — I10 Essential (primary) hypertension: Secondary | ICD-10-CM | POA: Diagnosis not present

## 2021-02-06 DIAGNOSIS — M2021 Hallux rigidus, right foot: Secondary | ICD-10-CM

## 2021-02-06 DIAGNOSIS — Z9889 Other specified postprocedural states: Secondary | ICD-10-CM

## 2021-02-06 DIAGNOSIS — Z4789 Encounter for other orthopedic aftercare: Secondary | ICD-10-CM | POA: Diagnosis not present

## 2021-02-06 DIAGNOSIS — I951 Orthostatic hypotension: Secondary | ICD-10-CM | POA: Diagnosis not present

## 2021-02-11 NOTE — Progress Notes (Signed)
Subjective: Kathy [name] is a 70 y.o. is seen today in office s/p right 1st MTPJ arthrodesis preformed on 12/18/2020.  She states she has been doing well.  She has been walking the cam boot utilizing a walker.  She has no significant pain.  Denies any increase in swelling or redness.  She has no other concerns today.      Objective: General: No acute distress, AAOx3  DP/PT pulses palpable 2/4, CRT < 3 sec to all digits.  Protective sensation intact. Motor function intact.  RIGHT foot: Incision is well coapted without any evidence of dehiscence and scars are formed.  There is no dehiscence or signs of infection. There is no erythema or warmth there is no drainage or pus. Mild edema. There is no significant tenderness palpation on the surgical site. Toes in rectus position the arthrodesis site appears to be stable. No other open lesions or pre-ulcerative lesions.  No pain with calf compression, swelling, warmth, erythema.   Assessment and Plan:  Status post right foot surgery, doing well with no complications   -Treatment options discussed including all alternatives, risks, and complications -X-rays obtained reviewed. Hardware intact status post first metatarsal phalangeal joint fusion without any complicating factors and there is increased consolidation found. -Has time on her continue the cam boot but she can start to transition to regular shoe as tolerated. -She is getting physical therapy at home and will continue with this.  New orders will be sent. -Continue to ice elevate.   Return in about 4 weeks (around 03/06/2021).  Repeat x-rays next appointment.  Transition to regular shoe if able but she has not done so yet.   Trula Slade DPM

## 2021-02-13 ENCOUNTER — Emergency Department (HOSPITAL_COMMUNITY)
Admission: EM | Admit: 2021-02-13 | Discharge: 2021-02-13 | Disposition: A | Discharge status: Home / Self Care | Payer: Medicare Other | Attending: Emergency Medicine | Admitting: Emergency Medicine

## 2021-02-13 ENCOUNTER — Emergency Department (HOSPITAL_COMMUNITY): Payer: Medicare Other

## 2021-02-13 ENCOUNTER — Encounter (HOSPITAL_COMMUNITY): Payer: Self-pay

## 2021-02-13 DIAGNOSIS — I110 Hypertensive heart disease with heart failure: Secondary | ICD-10-CM | POA: Diagnosis not present

## 2021-02-13 DIAGNOSIS — Z79899 Other long term (current) drug therapy: Secondary | ICD-10-CM | POA: Insufficient documentation

## 2021-02-13 DIAGNOSIS — R42 Dizziness and giddiness: Secondary | ICD-10-CM | POA: Diagnosis not present

## 2021-02-13 DIAGNOSIS — R000 Tachycardia, unspecified: Secondary | ICD-10-CM | POA: Insufficient documentation

## 2021-02-13 DIAGNOSIS — Z91040 Latex allergy status: Secondary | ICD-10-CM | POA: Diagnosis not present

## 2021-02-13 DIAGNOSIS — Z96652 Presence of left artificial knee joint: Secondary | ICD-10-CM | POA: Diagnosis not present

## 2021-02-13 DIAGNOSIS — Z20822 Contact with and (suspected) exposure to covid-19: Secondary | ICD-10-CM | POA: Insufficient documentation

## 2021-02-13 DIAGNOSIS — R519 Headache, unspecified: Secondary | ICD-10-CM | POA: Insufficient documentation

## 2021-02-13 DIAGNOSIS — M79603 Pain in arm, unspecified: Secondary | ICD-10-CM | POA: Insufficient documentation

## 2021-02-13 DIAGNOSIS — R5383 Other fatigue: Secondary | ICD-10-CM | POA: Diagnosis not present

## 2021-02-13 DIAGNOSIS — I10 Essential (primary) hypertension: Secondary | ICD-10-CM | POA: Diagnosis not present

## 2021-02-13 DIAGNOSIS — R55 Syncope and collapse: Secondary | ICD-10-CM | POA: Diagnosis not present

## 2021-02-13 DIAGNOSIS — I5032 Chronic diastolic (congestive) heart failure: Secondary | ICD-10-CM | POA: Diagnosis not present

## 2021-02-13 DIAGNOSIS — R52 Pain, unspecified: Secondary | ICD-10-CM

## 2021-02-13 LAB — RESP PANEL BY RT-PCR (FLU A&B, COVID) ARPGX2
Influenza A by PCR: NEGATIVE
Influenza B by PCR: NEGATIVE
SARS Coronavirus 2 by RT PCR: NEGATIVE

## 2021-02-13 LAB — COMPREHENSIVE METABOLIC PANEL
ALT: 15 U/L (ref 0–44)
AST: 32 U/L (ref 15–41)
Albumin: 4.9 g/dL (ref 3.5–5.0)
Alkaline Phosphatase: 106 U/L (ref 38–126)
Anion gap: 16 — ABNORMAL HIGH (ref 5–15)
BUN: 8 mg/dL (ref 8–23)
CO2: 27 mmol/L (ref 22–32)
Calcium: 9.4 mg/dL (ref 8.9–10.3)
Chloride: 98 mmol/L (ref 98–111)
Creatinine, Ser: 0.62 mg/dL (ref 0.44–1.00)
GFR, Estimated: >60 mL/min (ref >60)
Glucose, Bld: 90 mg/dL (ref 70–99)
Potassium: 3.3 mmol/L — ABNORMAL LOW (ref 3.5–5.1)
Sodium: 141 mmol/L (ref 135–145)
Total Bilirubin: 1.9 mg/dL — ABNORMAL HIGH (ref 0.3–1.2)
Total Protein: 8.6 g/dL — ABNORMAL HIGH (ref 6.5–8.1)

## 2021-02-13 LAB — CBC WITH DIFFERENTIAL/PLATELET
Abs Immature Granulocytes: 0.01 10*3/uL (ref 0.00–0.07)
Basophils Absolute: 0 10*3/uL (ref 0.0–0.1)
Basophils Relative: 1 %
Eosinophils Absolute: 0 10*3/uL (ref 0.0–0.5)
Eosinophils Relative: 0 %
HCT: 45.9 % (ref 36.0–46.0)
Hemoglobin: 16.2 g/dL — ABNORMAL HIGH (ref 12.0–15.0)
Immature Granulocytes: 0 %
Lymphocytes Relative: 33 %
Lymphs Abs: 0.9 10*3/uL (ref 0.7–4.0)
MCH: 32.9 pg (ref 26.0–34.0)
MCHC: 35.3 g/dL (ref 30.0–36.0)
MCV: 93.1 fL (ref 80.0–100.0)
Monocytes Absolute: 0.5 10*3/uL (ref 0.1–1.0)
Monocytes Relative: 17 %
Neutro Abs: 1.3 10*3/uL — ABNORMAL LOW (ref 1.7–7.7)
Neutrophils Relative %: 49 %
Platelets: 231 10*3/uL (ref 150–400)
RBC: 4.93 MIL/uL (ref 3.87–5.11)
RDW: 12.6 % (ref 11.5–15.5)
WBC: 2.8 10*3/uL — ABNORMAL LOW (ref 4.0–10.5)
nRBC: 0 % (ref 0.0–0.2)

## 2021-02-13 LAB — URINALYSIS, ROUTINE W REFLEX MICROSCOPIC
Bilirubin Urine: NEGATIVE
Glucose, UA: NEGATIVE mg/dL
Hgb urine dipstick: NEGATIVE
Ketones, ur: 20 mg/dL — AB
Leukocytes,Ua: NEGATIVE
Nitrite: NEGATIVE
Protein, ur: NEGATIVE mg/dL
Specific Gravity, Urine: 1.01 (ref 1.005–1.030)
pH: 8 (ref 5.0–8.0)

## 2021-02-13 LAB — LIPASE, BLOOD: Lipase: 39 U/L (ref 11–51)

## 2021-02-13 LAB — TROPONIN I (HIGH SENSITIVITY)
Troponin I (High Sensitivity): 7 ng/L (ref <18)
Troponin I (High Sensitivity): 8 ng/L (ref <18)

## 2021-02-13 LAB — TSH: TSH: 1.13 u[IU]/mL (ref 0.350–4.500)

## 2021-02-13 LAB — LACTIC ACID, PLASMA: Lactic Acid, Venous: 1.8 mmol/L (ref 0.5–1.9)

## 2021-02-13 MED ORDER — POTASSIUM CHLORIDE CRYS ER 20 MEQ PO TBCR
40.0000 meq | EXTENDED_RELEASE_TABLET | Freq: Once | ORAL | Status: AC
  Administered 2021-02-13: 40 meq via ORAL
  Filled 2021-02-13: qty 2

## 2021-02-13 MED ORDER — SODIUM CHLORIDE 0.9 % IV BOLUS
1000.0000 mL | Freq: Once | INTRAVENOUS | Status: AC
  Administered 2021-02-13: 1000 mL via INTRAVENOUS

## 2021-02-13 MED ORDER — SODIUM CHLORIDE 0.9 % IV BOLUS
500.0000 mL | Freq: Once | INTRAVENOUS | Status: AC
  Administered 2021-02-13: 500 mL via INTRAVENOUS

## 2021-02-13 NOTE — ED Provider Notes (Addendum)
[place] DEPT Provider Note   CSN: [id number] Arrival date & time: 02/13/21  1211     History Chief Complaint  Patient presents with  . Headache    Kathy [name] is a 70 y.o. female history of CHF, hypertension, fibromyalgia, macular degeneration, atherosclerosis, pulmonary sarcoidosis.  Patient presents to the ER today for feeling poorly for the past 2 days.  She reports mild intermittent body aches without clear cause.  She reports this is new for her she denies any sick contacts describes a mild aching sensation to her arms and legs no aggravating or alleviating factors no radiation of pain.  Patient reports that she has also been experiencing a frontal headache and mild dizziness in the past 2 months as well as tachycardia she reports that she has been admitted for these problems before and is currently following up with neurology and cardiology for further evaluation.  She denies any change to her headache, vision changes, neck stiffness, sore throat, chest pain/shortness of breath, abdominal pain, nausea/vomiting, diarrhea, dysuria/hematuria, fall/injury or any additional concerns.  HPI     Past Medical History:  Diagnosis Date  . Chronic diastolic CHF (congestive heart failure) ([address]) 01/13/2021  . Exotropia of left eye   . Fibromyalgia   . History of uterine leiomyoma   . Hypertension   . Macular degeneration   . OA (osteoarthritis)   . Overactive bladder   . Sarcoidosis   . Wears glasses     Patient Active Problem List   Diagnosis Date Noted  . Aortic atherosclerosis ([place]) 02/03/2021  . Chronic diastolic CHF (congestive heart failure) ([place]) 01/13/2021  . Hallux rigidus of right foot 01/13/2021  . Gastroenteritis 11/23/2020  . Syncope 11/21/2020  . Obese 06/16/2019  . S/P left TKA 06/15/2019  . Pain in left knee 02/08/2019  . Arthritis 10/28/2016  . Allergic rhinitis due to pollen 02/03/2016  . Exotropia 04/08/2015  .  Macular degeneration 04/27/2012  . Fibromyalgia 02/22/2012  . Pulmonary sarcoidosis ([place]) 07/09/2010  . Essential hypertension 07/09/2010    Past Surgical History:  Procedure Laterality Date  . ABDOMINAL HYSTERECTOMY  2013  . ADJUSTABLE SUTURE MANIPULATION Left 04/10/2015   Procedure: ADJUSTABLE SUTURE MANIPULATION;  Surgeon: Gevena Cotton, MD;  Location: [facility];  Service: Ophthalmology;  Laterality: Left;  . CATARACT EXTRACTION Bilateral 05/2019   Dr. Gershon Crane  . EYE SURGERY Bilateral 05/2019   Cat Sx - Dr. Gershon Crane  . MEDIAN RECTUS REPAIR Left 04/10/2015   Procedure: MEDIAN RECTUS RESECTION LATERAL RECTUS RECESSION,AJUSTABLES SUTURES LEFT EYE;  Surgeon: Gevena Cotton, MD;  Location: [facility];  Service: Ophthalmology;  Laterality: Left;  . TONSILLECTOMY  as child  . TOTAL ABDOMINAL HYSTERECTOMY W/ BILATERAL SALPINGOOPHORECTOMY  10-14-2010   and TVT midurethral sling and culdoplasty  . TOTAL KNEE ARTHROPLASTY Left 06/15/2019   Procedure: TOTAL KNEE ARTHROPLASTY;  Surgeon: Paralee Cancel, MD;  Location: [facility];  Service: Orthopedics;  Laterality: Left;  70 mins     OB History   No obstetric history on file.     Family History  Problem Relation Age of Onset  . Colon cancer Mother   . Diabetes Father   . Diabetes Sister   . Hypertension Sister   . Diabetes Sister   . Hypertension Sister   . Hypertension Sister     Social History   Tobacco Use  . Smoking status: Never Smoker  . Smokeless tobacco: Never Used  Vaping Use  . Vaping Use:  Never used  Substance Use Topics  . Alcohol use: Yes    Alcohol/week: 1.0 standard drink    Types: 1 Glasses of wine per week    Comment: rare  . Drug use: No    Home Medications Prior to Admission medications   Medication Sig Start Date End Date Taking? Authorizing Provider  acetaminophen (TYLENOL) 500 MG tablet Take 2 tablets (1,000 mg total) by mouth every 8 (eight) hours as needed for moderate  pain. 11/26/20   Hosie Poisson, MD  amLODipine (NORVASC) 5 MG tablet Take 1 tablet (5 mg total) by mouth daily. 02/03/21   Chandrasekhar, Lyda Kalata A, MD  cycloSPORINE (RESTASIS) 0.05 % ophthalmic emulsion Place 1 drop into both eyes 2 (two) times daily.    [provider]  docusate sodium (COLACE) 100 MG capsule Take 100 mg by mouth 2 (two) times daily as needed for mild constipation.    [provider]  folic acid (FOLVITE) 1 MG tablet Take 1 tablet (1 mg total) by mouth daily. 01/19/21 04/19/21  [place] Eric J, DO  Multiple Vitamins-Minerals (PRESERVISION AREDS 2) CAPS Take 1 capsule by mouth 2 (two) times a day.    [provider]  ondansetron (ZOFRAN) 4 MG tablet Take 1 tablet (4 mg total) by mouth every 8 (eight) hours as needed for nausea or vomiting. 12/25/20   Trula Slade, DPM    Allergies    Lortab [hydrocodone-acetaminophen] and Latex  Review of Systems   Review of Systems Ten systems are reviewed and are negative for acute change except as noted in the HPI  Physical Exam Updated Vital Signs BP 137/81 (BP Location: Right Arm)   Pulse 100   Temp 98.5 F (36.9 C) (Oral)   Resp 16   Ht 5\' 2"  (1.575 m)   Wt 77.1 kg   SpO2 100%   BMI 31.09 kg/m   Physical Exam Constitutional:      General: She is not in acute distress.    Appearance: Normal appearance. She is well-developed. She is not ill-appearing or diaphoretic.  HENT:     Head: Normocephalic and atraumatic.  Eyes:     General: Vision grossly intact. Gaze aligned appropriately.     Pupils: Pupils are equal, round, and reactive to light.  Neck:     Trachea: Trachea and phonation normal.  Cardiovascular:     Rate and Rhythm: Normal rate and regular rhythm.  Pulmonary:     Effort: Pulmonary effort is normal. No respiratory distress.     Breath sounds: Normal breath sounds.  Abdominal:     General: There is no distension.     Palpations: Abdomen is soft.     Tenderness: There is no  abdominal tenderness. There is no guarding or rebound.  Musculoskeletal:        General: Normal range of motion.     Cervical back: Normal range of motion.     Right lower leg: No edema.     Left lower leg: No edema.       Feet:  Feet:     Comments: Well-healed scar left foot without surrounding signs of infection. Skin:    General: Skin is warm and dry.  Neurological:     Mental Status: She is alert.     GCS: GCS eye subscore is 4. GCS verbal subscore is 5. GCS motor subscore is 6.     Comments: Speech is clear and goal oriented, follows commands Major Cranial nerves without deficit, no facial  droop Moves extremities without ataxia, coordination intact  Psychiatric:        Behavior: Behavior normal.     ED Results / Procedures / Treatments   Labs (all labs ordered are listed, but only abnormal results are displayed) Labs Reviewed  CBC WITH DIFFERENTIAL/PLATELET - Abnormal; Notable for the following components:      Result Value   WBC 2.8 (*)    Hemoglobin 16.2 (*)    Neutro Abs 1.3 (*)    All other components within normal limits  COMPREHENSIVE METABOLIC PANEL - Abnormal; Notable for the following components:   Potassium 3.3 (*)    Total Protein 8.6 (*)    Total Bilirubin 1.9 (*)    Anion gap 16 (*)    All other components within normal limits  URINALYSIS, ROUTINE W REFLEX MICROSCOPIC - Abnormal; Notable for the following components:   APPearance HAZY (*)    Ketones, ur 20 (*)    All other components within normal limits  RESP PANEL BY RT-PCR (FLU A&B, COVID) ARPGX2  LIPASE, BLOOD  LACTIC ACID, PLASMA  TSH  TROPONIN I (HIGH SENSITIVITY)  TROPONIN I (HIGH SENSITIVITY)    EKG EKG Interpretation  Date/Time:  Thursday February 13 2021 13:50:17 EDT Ventricular Rate:  104 PR Interval:    QRS Duration: 90 QT Interval:  367 QTC Calculation: 483 R Axis:   44 Text Interpretation: Sinus tachycardia Abnrm T, consider ischemia, anterolateral lds Since last tracing rate  faster Otherwise no significant change Confirmed by Deno Etienne [phone]) on 02/13/2021 2:07:02 PM   Radiology CT Head Wo Contrast  Result Date: 02/13/2021 CLINICAL DATA:  Headache, new or worsening. Additional history provided: Patient reports hypertension, dizziness, frontal headache, right lower extremity pain. EXAM: CT HEAD WITHOUT CONTRAST TECHNIQUE: Contiguous axial images were obtained from the base of the skull through the vertex without intravenous contrast. COMPARISON:  CT angiogram head/neck 01/15/2021. FINDINGS: Brain: Mild cerebral atrophy. Mild ill-defined hypoattenuation within the cerebral white matter is nonspecific, but compatible with chronic small vessel ischemic disease. There is no acute intracranial hemorrhage. No demarcated cortical infarct. No extra-axial fluid collection. No evidence of intracranial mass. No midline shift. Partially empty sella turcica. Vascular: No hyperdense vessel.  Atherosclerotic calcifications. Skull: Normal. Negative for fracture or focal lesion. Sinuses/Orbits: Visualized orbits show no acute finding. Trace scattered paranasal sinus mucosal thickening at the imaged levels. IMPRESSION: No evidence of acute intracranial abnormality. Mild cerebral atrophy and chronic small vessel ischemic disease, stable. Electronically Signed   By: Kellie Simmering DO   On: 02/13/2021 13:45   DG Chest Portable 1 View  Result Date: 02/13/2021 CLINICAL DATA:  Hypertension and fatigue EXAM: PORTABLE CHEST 1 VIEW COMPARISON:  Apr 24, 2016 FINDINGS: Lungs are clear. Heart is upper normal in size with pulmonary vascularity normal. No adenopathy. Several calcified nonenlarged lymph nodes. Monitor device on the left. No bone lesions. IMPRESSION: Lungs clear. Heart upper normal in size. Several calcified nonenlarged lymph nodes suggesting prior granulomatous disease. Electronically Signed   By: Lowella Grip III M.D.   On: 02/13/2021 13:28    Procedures Procedures   Medications  Ordered in ED Medications  sodium chloride 0.9 % bolus 1,000 mL (0 mLs Intravenous Stopped 02/13/21 1715)  sodium chloride 0.9 % bolus 500 mL (0 mLs Intravenous Stopped 02/13/21 1923)  potassium chloride SA (KLOR-CON) CR tablet 40 mEq (40 mEq Oral Given 02/13/21 1903)    ED Course  I have reviewed the triage vital signs and the nursing notes.  Pertinent labs & imaging results that were available during my care of the patient were reviewed by me and considered in my medical decision making (see chart for details).    MDM Rules/Calculators/A&P                         Additional history obtained from: 1. Nursing notes from this visit. 2. Review of electronic medical records.  Patient had admission to the hospital on January 13, 2021, discharged 5 days later.  Diagnosis of syncope/collapse, hyponatremia, HCAP.  Patient had presented to the ER after syncope and collapse after getting up to the bathroom.  CT head and C-spine were unrevealing, urinalysis was unremarkable.  Patient had CT chest PE study which showed upper lobe scarring but no evidence of pulmonary embolism.  Patient had been admitted in December for syncope which was felt to be related to UTI.  Patient had echocardiogram during that visit. ------------------------------ 71 year old female presented today for feeling poorly for the past 2 days she reports generalized body aches no other specific complaints.  She has been experiencing tachycardia and headache she reports for the past few months is currently being evaluated by both cardiology and neurology for syncopal episodes which required an admission last month.  Patient is mildly tachycardic on arrival denies any chest pain shortness of breath abdominal pain or recent infectious symptoms. - I ordered, reviewed and interpreted labs which include: CBC shows mild leukopenia of 2.8, elevated hemoglobin 16.2, no thrombocytopenia. CMP shows mild hypokalemia 3.3, mild gap of 16, mild total  bili elevation of 1.9.  No emergent electrolyte derangement, AKI, LFT elevations.  No right upper quadrant tenderness or abdominal pain nausea/vomiting.  Doubt biliary pathology at this time. High-sensitivity troponin within normal limits x2. Lipase normal limits. Covid/influenza panel negative. Lactic within normal limits, reassuring. TSH within normal limits. Urinalysis shows 20 ketones otherwise within normal limits, suspect secondary dehydration no evidence for UTI.  CT Head:  IMPRESSION:  No evidence of acute intracranial abnormality.    Mild cerebral atrophy and chronic small vessel ischemic disease,  stable.   CXR:  IMPRESSION:  Lungs clear. Heart upper normal in size. Several calcified  nonenlarged lymph nodes suggesting prior granulomatous disease.   EKG: Sinus tachycardia Abnrm T, consider ischemia, anterolateral lds Since last tracing rate faster Otherwise no significant change Confirmed by Deno Etienne [phone]) on 02/13/2021 2:07:02 PM - Patient reassessed she is resting comfortably no acute distress eating and drinking food.  She has been waling around ED without assistance or difficulty.  She reports she was feeling much better after receiving IV fluids and would like to be discharged.  She denies any pain, she plans to follow-up with her cardiologist, neurologist and PCP for follow-up visits.  There is no evidence for infection and no indication for antibiotics at this time.  Possible patient experiencing a viral illness, have encouraged patient to closely follow-up with her PCP for recheck and return to the ER immediately for any new or worsening symptoms, patient and her husband both state understanding.  Discussed alcohol use with patient she reports she does drink but has not drank in the past few days.  Advised patient to abstain from alcohol use as this may be a cause of some of the symptoms she has been experiencing.  Suspect patient's lab abnormalities including elevated  hemoglobin level, slightly low potassium, mild anion gap and few ketones in the urine may be due to dehydration she was given  IV fluids for correction.  Patient was mildly tachycardic on arrival no infectious symptoms doubt sepsis at this time heart rate improved following IV fluids.  I encourage patient to have labs including potassium, bilirubin, gap and blood counts rechecked by her PCP at follow-up visit.  Patient without chest pain shortness of breath or syncope, low suspicion for ACS, PE, dissection, pneumonia, sepsis, biliary pathology or other emergent pathology requiring further imaging or workup at this time.  At this time there does not appear to be any evidence of an acute emergency medical condition and the patient appears stable for discharge with appropriate outpatient follow up. Diagnosis was discussed with patient who verbalizes understanding of care plan and is agreeable to discharge. I have discussed return precautions with patient who verbalizes understanding. Patient encouraged to follow-up with their PCP. All questions answered.  Patient seen and evaluated by Dr. Tyrone Nine during this visit who agrees with discharge and outpatient follow-up.  Note: Portions of this report may have been transcribed using voice recognition software. Every effort was made to ensure accuracy; however, inadvertent computerized transcription errors may still be present. Final Clinical Impression(s) / ED Diagnoses Final diagnoses:  Body aches    Rx / DC Orders ED Discharge Orders    None       Deliah Boston, PA-C 02/13/21 1926    Gari Crown 02/13/21 1939    Deno Etienne, DO 02/13/21 1946

## 2021-02-13 NOTE — ED Notes (Signed)
Patient given food/drink.

## 2021-02-13 NOTE — Discharge Instructions (Signed)
At this time there does not appear to be the presence of an emergent medical condition, however there is always the potential for conditions to change. Please read and follow the below instructions.  Please return to the Emergency Department immediately for any new or worsening symptoms. Please call your primary care provider and your cardiologist tomorrow morning to schedule follow-up appointments for reevaluation. Please drink plenty of water to avoid dehydration and get plenty of rest.  Your potassium level was slightly low in the ER today, please have this level rechecked by your primary care provider at your follow-up visit.  Your white blood cell count was slightly low and your hemoglobin level was slightly high today, additionally your total bilirubin level was slightly high today, please have these rechecked by your primary care provider at your follow-up visit.  Go to the nearest Emergency Department immediately if: You have fever or chills Trouble breathing. A severe headache or a stiff neck. Severe vomiting or pain in your abdomen. You have chest pain You have nausea or vomiting or diarrhea You passout or feel like you are going to pass out You have any new/concerning or worsening of symptoms.   Please read the additional information packets attached to your discharge summary.  Do not take your medicine if  develop an itchy rash, swelling in your mouth or lips, or difficulty breathing; call 911 and seek immediate emergency medical attention if this occurs.  You may review your lab tests and imaging results in their entirety on your MyChart account.  Please discuss all results of fully with your primary care provider and other specialist at your follow-up visit.  Note: Portions of this text may have been transcribed using voice recognition software. Every effort was made to ensure accuracy; however, inadvertent computerized transcription errors may still be present.

## 2021-02-13 NOTE — ED Triage Notes (Signed)
Pt BIB [facility] for hypertension. At home 173/104. Feeling bad for 2 days, c/o mild dizziness, frontal HA, RLE pain. Took amlodipine at 10am, started 2 weeks ago. Currently wearing heart monitor for tachycardia. Scheduled to see neuro for HA in April. Bunion surgery 1/19. ST on EKG. No distress, no deficits. Mild assist to ambulate. Leg pain/HA rated 8/10.   BP 164/98 HR 120 RR 18 SpO2 100% RA

## 2021-02-17 DIAGNOSIS — R262 Difficulty in walking, not elsewhere classified: Secondary | ICD-10-CM | POA: Diagnosis not present

## 2021-02-17 DIAGNOSIS — I10 Essential (primary) hypertension: Secondary | ICD-10-CM | POA: Diagnosis not present

## 2021-02-17 DIAGNOSIS — M2021 Hallux rigidus, right foot: Secondary | ICD-10-CM | POA: Diagnosis not present

## 2021-02-17 DIAGNOSIS — D860 Sarcoidosis of lung: Secondary | ICD-10-CM | POA: Diagnosis not present

## 2021-02-17 DIAGNOSIS — I951 Orthostatic hypotension: Secondary | ICD-10-CM | POA: Diagnosis not present

## 2021-02-17 DIAGNOSIS — M19071 Primary osteoarthritis, right ankle and foot: Secondary | ICD-10-CM | POA: Diagnosis not present

## 2021-02-17 DIAGNOSIS — Z4789 Encounter for other orthopedic aftercare: Secondary | ICD-10-CM | POA: Diagnosis not present

## 2021-02-19 ENCOUNTER — Other Ambulatory Visit: Payer: Self-pay

## 2021-02-19 ENCOUNTER — Ambulatory Visit (INDEPENDENT_AMBULATORY_CARE_PROVIDER_SITE_OTHER): Payer: Medicare Other | Admitting: Family Medicine

## 2021-02-19 ENCOUNTER — Encounter: Payer: Self-pay | Admitting: Family Medicine

## 2021-02-19 VITALS — BP 138/90 | HR 105 | Temp 98.8°F | Wt 169.2 lb

## 2021-02-19 DIAGNOSIS — E876 Hypokalemia: Secondary | ICD-10-CM | POA: Diagnosis not present

## 2021-02-19 DIAGNOSIS — R519 Headache, unspecified: Secondary | ICD-10-CM

## 2021-02-19 NOTE — Patient Instructions (Signed)
At the grocery store you can get something called lo salt, about a teaspoon a day of time

## 2021-02-19 NOTE — Progress Notes (Signed)
   Subjective:    Patient ID: Kathy [name], female    DOB: [birth year], 70 y.o.   MRN: [id number]  HPI She is here for a recheck after recent emergency room visit.  The ER record was reviewed and she apparently went there because she was not feeling well.  She is in the process of being evaluated by neurology as well as cardiology.  Presently her only complaint is a popping in the right ear.  No chest pain, shortness of breath, weakness, syncopal type episodes.   Review of Systems     Objective:   Physical Exam Alert and in no distress. Tympanic membranes and canals are normal. Pharyngeal area is normal. Neck is supple without adenopathy or thyromegaly. Cardiac exam shows a regular sinus rhythm without murmurs or gallops. Lungs are clear to auscultation. The emergency room record was reviewed.  It did show slightly low potassium.       Assessment & Plan:  Hypokalemia  Nonintractable headache, unspecified chronicity pattern, unspecified headache type At the grocery store you can get something called lo salt, about a teaspoon a day of time Encouraged her to keep her appointment with neurology as well as cardiology otherwise I will see her on an as-needed basis.

## 2021-02-27 DIAGNOSIS — D860 Sarcoidosis of lung: Secondary | ICD-10-CM | POA: Diagnosis not present

## 2021-02-27 DIAGNOSIS — R262 Difficulty in walking, not elsewhere classified: Secondary | ICD-10-CM | POA: Diagnosis not present

## 2021-02-27 DIAGNOSIS — Z4789 Encounter for other orthopedic aftercare: Secondary | ICD-10-CM | POA: Diagnosis not present

## 2021-02-27 DIAGNOSIS — M19071 Primary osteoarthritis, right ankle and foot: Secondary | ICD-10-CM | POA: Diagnosis not present

## 2021-02-27 DIAGNOSIS — I951 Orthostatic hypotension: Secondary | ICD-10-CM | POA: Diagnosis not present

## 2021-02-27 DIAGNOSIS — I10 Essential (primary) hypertension: Secondary | ICD-10-CM | POA: Diagnosis not present

## 2021-02-28 DIAGNOSIS — R55 Syncope and collapse: Secondary | ICD-10-CM | POA: Diagnosis not present

## 2021-03-03 DIAGNOSIS — H538 Other visual disturbances: Secondary | ICD-10-CM | POA: Diagnosis not present

## 2021-03-03 DIAGNOSIS — Z961 Presence of intraocular lens: Secondary | ICD-10-CM | POA: Diagnosis not present

## 2021-03-03 DIAGNOSIS — H3122 Choroidal dystrophy (central areolar) (generalized) (peripapillary): Secondary | ICD-10-CM | POA: Diagnosis not present

## 2021-03-03 DIAGNOSIS — H5440 Blindness, one eye, unspecified eye: Secondary | ICD-10-CM | POA: Diagnosis not present

## 2021-03-10 ENCOUNTER — Ambulatory Visit (INDEPENDENT_AMBULATORY_CARE_PROVIDER_SITE_OTHER): Payer: Medicare Other

## 2021-03-10 ENCOUNTER — Ambulatory Visit (INDEPENDENT_AMBULATORY_CARE_PROVIDER_SITE_OTHER): Payer: Medicare Other | Admitting: Podiatry

## 2021-03-10 ENCOUNTER — Other Ambulatory Visit: Payer: Self-pay

## 2021-03-10 DIAGNOSIS — M19071 Primary osteoarthritis, right ankle and foot: Secondary | ICD-10-CM | POA: Diagnosis not present

## 2021-03-10 DIAGNOSIS — Z9889 Other specified postprocedural states: Secondary | ICD-10-CM

## 2021-03-11 ENCOUNTER — Encounter: Payer: Self-pay | Admitting: Podiatry

## 2021-03-11 NOTE — Progress Notes (Signed)
  Subjective:  Patient ID: [name], female    DOB: [birth year],  MRN: [id number]  Chief Complaint  Patient presents with  . Arthritis    Right foot, hallux rigidus right  . Routine Post Op    DOS: 12/18/2020 Procedure: First MTPJ arthrodesis right foot  70 y.o. female returns for post-op check.  Doing fairly well  Review of Systems: Negative except as noted in the HPI. Denies N/V/F/Ch.   Objective:  There were no vitals filed for this visit. There is no height or weight on file to calculate BMI. Constitutional Well developed. Well nourished.  Vascular Foot warm and well perfused. Capillary refill normal to all digits.   Neurologic Normal speech. Oriented to person, place, and time. Epicritic sensation to light touch grossly present bilaterally.  Dermatologic  well-healed surgical incisions  Orthopedic: Tenderness to palpation noted about the surgical site.  Mild edema   Radiographs: Good consolidation of arthrodesis site Assessment:   1. Arthritis of right foot   2. Post-operative state    Plan:  Patient was evaluated and treated and all questions answered.  S/p foot surgery right -Progressing as expected post-operatively. -XR: As above -WB Status: WBAT in regular shoe when tolerated.  She has some residual edema to think is the main preventing factor.  Advised to wear a stretchy or wide shoe.  Compression sleeve dispensed -New x-rays next visit  Return in about 1 month (around 04/09/2021) for with Dr Jacqualyn Posey .

## 2021-03-20 ENCOUNTER — Ambulatory Visit: Payer: Medicare Other | Admitting: Podiatry

## 2021-03-24 ENCOUNTER — Ambulatory Visit: Payer: Medicare Other | Admitting: Neurology

## 2021-03-25 ENCOUNTER — Ambulatory Visit (INDEPENDENT_AMBULATORY_CARE_PROVIDER_SITE_OTHER): Payer: Medicare Other | Admitting: Family Medicine

## 2021-03-25 ENCOUNTER — Encounter: Payer: Self-pay | Admitting: Family Medicine

## 2021-03-25 VITALS — BP 160/96 | HR 100 | Temp 98.0°F | Ht 63.5 in | Wt 172.8 lb

## 2021-03-25 DIAGNOSIS — L300 Nummular dermatitis: Secondary | ICD-10-CM

## 2021-03-25 DIAGNOSIS — Z23 Encounter for immunization: Secondary | ICD-10-CM

## 2021-03-25 NOTE — Progress Notes (Signed)
   Subjective:    Patient ID: Kathy [name], female    DOB: December 25, 1950, 70 y.o.   MRN: [id number]  HPI She is here for evaluation of a rash.  She has had intermittent difficulty with this for years but has not sought any care for it.  She notes that between her breasts and also on her upper back area. She would also like a follow-up COVID shot. Review of Systems     Objective:   Physical Exam She has multiple dry scaly slightly raised lesions present on her back as well as between her breasts and in the posterior scalp area.       Assessment & Plan:  Discoid eczema - Plan: Ambulatory referral to Dermatology  Immunization, viral disease - Plan: PFIZER Comirnaty(GRAY TOP)COVID-19 Vaccine I explained that I thought this was eczema and needed to be seen by dermatology to make sure she gets on the appropriate medication.

## 2021-03-31 ENCOUNTER — Telehealth: Payer: Self-pay | Admitting: Family Medicine

## 2021-03-31 NOTE — Telephone Encounter (Signed)
Pt called she wants referral to different neurologist "not the one on [address]"  She states they keep cancelling her appointments

## 2021-04-01 ENCOUNTER — Ambulatory Visit: Payer: Medicare Other | Admitting: Neurology

## 2021-04-01 NOTE — Telephone Encounter (Signed)
Changed to [place]. [place]

## 2021-04-01 NOTE — Telephone Encounter (Signed)
Take care of this 

## 2021-04-02 DIAGNOSIS — Z1231 Encounter for screening mammogram for malignant neoplasm of breast: Secondary | ICD-10-CM | POA: Diagnosis not present

## 2021-04-02 LAB — HM MAMMOGRAPHY

## 2021-04-10 ENCOUNTER — Ambulatory Visit: Payer: Medicare Other | Admitting: Podiatry

## 2021-04-15 ENCOUNTER — Ambulatory Visit: Payer: Medicare Other | Admitting: Podiatry

## 2021-04-15 ENCOUNTER — Ambulatory Visit (INDEPENDENT_AMBULATORY_CARE_PROVIDER_SITE_OTHER): Payer: Medicare Other

## 2021-04-15 ENCOUNTER — Encounter: Payer: Self-pay | Admitting: Podiatry

## 2021-04-15 ENCOUNTER — Other Ambulatory Visit: Payer: Self-pay

## 2021-04-15 DIAGNOSIS — M2021 Hallux rigidus, right foot: Secondary | ICD-10-CM | POA: Diagnosis not present

## 2021-04-15 DIAGNOSIS — M79674 Pain in right toe(s): Secondary | ICD-10-CM | POA: Diagnosis not present

## 2021-04-15 DIAGNOSIS — M79675 Pain in left toe(s): Secondary | ICD-10-CM | POA: Diagnosis not present

## 2021-04-15 DIAGNOSIS — M79672 Pain in left foot: Secondary | ICD-10-CM

## 2021-04-15 DIAGNOSIS — M2022 Hallux rigidus, left foot: Secondary | ICD-10-CM

## 2021-04-15 DIAGNOSIS — M79671 Pain in right foot: Secondary | ICD-10-CM

## 2021-04-15 DIAGNOSIS — B351 Tinea unguium: Secondary | ICD-10-CM | POA: Diagnosis not present

## 2021-04-16 ENCOUNTER — Other Ambulatory Visit: Payer: Self-pay | Admitting: Podiatry

## 2021-04-17 ENCOUNTER — Other Ambulatory Visit: Payer: Self-pay | Admitting: Podiatry

## 2021-04-17 DIAGNOSIS — M2021 Hallux rigidus, right foot: Secondary | ICD-10-CM

## 2021-04-20 NOTE — Progress Notes (Signed)
Subjective: Kathy [name] is a 70 y.o. is seen today in office s/p right 1st MTPJ arthrodesis preformed on 12/18/2020.  States that she is back to wearing tennis shoe when she is doing well.  Not having significant pain at the surgical site.  Minimal edema at times.  She has no concerns from surgery standpoint.  She had similar discomfort in the left pectoral, she was having the right side.  She is asking for nails be trimmed today as are thickened elongated she cannot do them herself.  Denies any increase in swelling or redness.  She has no other concerns today.      Objective: General: No acute distress, AAOx3  DP/PT pulses palpable 2/4, CRT < 3 sec to all digits.  Protective sensation intact. Motor function intact.  RIGHT foot: Incision is well coapted without any evidence of dehiscence and scar has formed.  Trace edema.  No erythema or warmth.  Arthrodesis site appears to be stable.  No tenderness to palpation. LEFT: Arthritic changes present the first MPJ.  No other areas of discomfort. Nails are hypertrophic, dystrophic, brittle, discolored, elongated 10. No surrounding redness or drainage. Tenderness nails 1-5 bilaterally. No open lesions or pre-ulcerative lesions are identified today. No pain with calf compression, swelling, warmth, erythema.   Assessment and Plan:  Status post right foot surgery, doing well with no complications; left foot hallux rigidus;  symptomatic onychomycosis  -Treatment options discussed including all alternatives, risks, and complications -X-rays obtained reviewed. Hardware intact status post first metatarsal phalangeal joint fusion without any complicating factors and there is increased consolidation noted.  On the left side arthritic changes present the first MPJ. -She is back to her regular shoe.  Discussed gradual increase activity level.  Ice daily as well as compression help with any residual edema. -For both feet discussed wearing more rigid bottom  shoe. -Nails sharply debrided x10 without any complications or bleeding.  Return in about 2 months (around 06/15/2021).  Trula Slade DPM

## 2021-04-21 ENCOUNTER — Other Ambulatory Visit: Payer: Self-pay | Admitting: Podiatry

## 2021-04-21 DIAGNOSIS — M2022 Hallux rigidus, left foot: Secondary | ICD-10-CM

## 2021-05-06 ENCOUNTER — Other Ambulatory Visit: Payer: Self-pay | Admitting: Physician Assistant

## 2021-05-06 DIAGNOSIS — D485 Neoplasm of uncertain behavior of skin: Secondary | ICD-10-CM | POA: Diagnosis not present

## 2021-05-06 DIAGNOSIS — L308 Other specified dermatitis: Secondary | ICD-10-CM | POA: Diagnosis not present

## 2021-05-06 DIAGNOSIS — L309 Dermatitis, unspecified: Secondary | ICD-10-CM | POA: Diagnosis not present

## 2021-05-06 DIAGNOSIS — L98499 Non-pressure chronic ulcer of skin of other sites with unspecified severity: Secondary | ICD-10-CM | POA: Diagnosis not present

## 2021-06-17 ENCOUNTER — Ambulatory Visit (INDEPENDENT_AMBULATORY_CARE_PROVIDER_SITE_OTHER): Payer: Medicare Other | Admitting: Podiatry

## 2021-06-17 ENCOUNTER — Encounter: Payer: Self-pay | Admitting: Podiatry

## 2021-06-17 ENCOUNTER — Other Ambulatory Visit: Payer: Self-pay

## 2021-06-17 DIAGNOSIS — M79674 Pain in right toe(s): Secondary | ICD-10-CM | POA: Diagnosis not present

## 2021-06-17 DIAGNOSIS — B351 Tinea unguium: Secondary | ICD-10-CM

## 2021-06-17 DIAGNOSIS — M79675 Pain in left toe(s): Secondary | ICD-10-CM

## 2021-06-19 DIAGNOSIS — L2089 Other atopic dermatitis: Secondary | ICD-10-CM | POA: Diagnosis not present

## 2021-06-19 DIAGNOSIS — L218 Other seborrheic dermatitis: Secondary | ICD-10-CM | POA: Diagnosis not present

## 2021-06-21 NOTE — Progress Notes (Signed)
Subjective: 70 y.o. returns the office today for painful, elongated, thickened toenails which she  cannot trim herself. Denies any redness or drainage around the nails.  She said the surgical sites been doing mostly back to her regular shoe gear without any issues.  Denies any acute changes since last appointment and no new complaints today. Denies any systemic complaints such as fevers, chills, nausea, vomiting.   PCP: Denita Lung, MD   Objective: AAO 3, NAD DP/PT pulses palpable, CRT less than 3 seconds Nails hypertrophic, dystrophic, elongated, brittle, discolored 10. There is tenderness overlying the nails 1-5 bilaterally. There is no surrounding erythema or drainage along the nail sites. No open lesions or pre-ulcerative lesions are identified. Surgical site is well-healed there is no pain.  Trace edema.  No erythema or warmth.  Arthrodesis site is stable. No other areas of tenderness bilateral lower extremities. No overlying edema, erythema, increased warmth. No pain with calf compression, swelling, warmth, erythema.  Assessment: Patient presents with symptomatic onychomycosis  Plan: -Treatment options including alternatives, risks, complications were discussed -Nails sharply debrided 10 without complication/bleeding. -Surgical sites appear to be healing well without any pain.  If there is any issues to let me know. -Discussed daily foot inspection. If there are any changes, to call the office immediately.  -Follow-up in 3 months or sooner if any problems are to arise. In the meantime, encouraged to call the office with any questions, concerns, changes symptoms.  Celesta Gentile, DPM

## 2021-07-23 ENCOUNTER — Encounter (INDEPENDENT_AMBULATORY_CARE_PROVIDER_SITE_OTHER): Payer: Medicare Other | Admitting: Ophthalmology

## 2021-07-29 ENCOUNTER — Encounter (INDEPENDENT_AMBULATORY_CARE_PROVIDER_SITE_OTHER): Payer: Medicare Other | Admitting: Ophthalmology

## 2021-07-29 DIAGNOSIS — H509 Unspecified strabismus: Secondary | ICD-10-CM

## 2021-07-29 DIAGNOSIS — Z961 Presence of intraocular lens: Secondary | ICD-10-CM

## 2021-07-29 DIAGNOSIS — H25813 Combined forms of age-related cataract, bilateral: Secondary | ICD-10-CM

## 2021-07-29 DIAGNOSIS — H3122 Choroidal dystrophy (central areolar) (generalized) (peripapillary): Secondary | ICD-10-CM

## 2021-07-29 DIAGNOSIS — H33101 Unspecified retinoschisis, right eye: Secondary | ICD-10-CM

## 2021-07-29 DIAGNOSIS — H353134 Nonexudative age-related macular degeneration, bilateral, advanced atrophic with subfoveal involvement: Secondary | ICD-10-CM

## 2021-07-29 DIAGNOSIS — H3581 Retinal edema: Secondary | ICD-10-CM

## 2021-07-29 DIAGNOSIS — H31103 Choroidal degeneration, unspecified, bilateral: Secondary | ICD-10-CM

## 2021-07-29 DIAGNOSIS — H3321 Serous retinal detachment, right eye: Secondary | ICD-10-CM

## 2021-08-11 ENCOUNTER — Other Ambulatory Visit: Payer: Self-pay

## 2021-08-11 ENCOUNTER — Ambulatory Visit (INDEPENDENT_AMBULATORY_CARE_PROVIDER_SITE_OTHER): Payer: Medicare Other | Admitting: Family Medicine

## 2021-08-11 VITALS — BP 170/98 | HR 115 | Temp 97.7°F | Wt 169.8 lb

## 2021-08-11 DIAGNOSIS — D860 Sarcoidosis of lung: Secondary | ICD-10-CM | POA: Diagnosis not present

## 2021-08-11 DIAGNOSIS — R1033 Periumbilical pain: Secondary | ICD-10-CM | POA: Diagnosis not present

## 2021-08-11 DIAGNOSIS — R300 Dysuria: Secondary | ICD-10-CM

## 2021-08-11 DIAGNOSIS — R062 Wheezing: Secondary | ICD-10-CM | POA: Diagnosis not present

## 2021-08-11 LAB — POCT URINALYSIS DIP (PROADVANTAGE DEVICE)
Bilirubin, UA: NEGATIVE
Glucose, UA: NEGATIVE mg/dL
Ketones, POC UA: NEGATIVE mg/dL
Leukocytes, UA: NEGATIVE
Nitrite, UA: NEGATIVE
Specific Gravity, Urine: 1.015
Urobilinogen, Ur: 0.2
pH, UA: 6.5 (ref 5.0–8.0)

## 2021-08-11 MED ORDER — SULFAMETHOXAZOLE-TRIMETHOPRIM 800-160 MG PO TABS: 1.0000 | ORAL_TABLET | Freq: Two times a day (BID) | ORAL | 0 refills | Status: DC

## 2021-08-11 NOTE — Progress Notes (Signed)
   Subjective:    Patient ID: Kathy [name], female    DOB: 10-Oct-1951, 70 y.o.   MRN: [id number]  HPI She is having difficulty with multiple issues.  She has a 2-day history of wheezing but no fever, chills, earache, shortness of breath.  Does have a previous history of pulmonary sarcoidosis but is not have trouble with that and several years.  She also complains of a 4-day history of periumbilical intermittent abdominal pain.  Food and fluids have no effect.  There is no occasional vomiting with this.  She also states she has been having green stool.  She also complains of again a 4-day history of dysuria but no urgency or frequency.   Review of Systems     Objective:   Physical Exam Alert and in no distress. Tympanic membranes and canals are normal. Pharyngeal area is normal. Neck is supple without adenopathy or thyromegaly. Cardiac exam shows a regular sinus rhythm without murmurs or gallops. Lungs are clear to auscultation.  Abdominal exam shows decreased bowel sounds without masses or tenderness. Urine dipstick was positive for RBCs however cannot do a microscopic.       Assessment & Plan:  Burning with urination - Plan: POCT Urinalysis DIP (Proadvantage Device), sulfamethoxazole-trimethoprim (BACTRIM DS) 800-160 MG tablet  Wheezing  Periumbilical abdominal pain  Pulmonary sarcoidosis (HCC) I explained that I could not say how much this was interrelated with each other especially with the wheezing.  Explained that I will treat with an antibiotic to see if it will help get rid of the abdominal and urinary symptoms and will need to follow-up in 2 weeks.  She will call if any of these things get worse.  She was comfortable with that.

## 2021-08-12 ENCOUNTER — Ambulatory Visit: Payer: Medicare Other | Admitting: Family Medicine

## 2021-08-18 ENCOUNTER — Ambulatory Visit: Payer: Medicare Other | Admitting: Podiatry

## 2021-08-18 ENCOUNTER — Other Ambulatory Visit: Payer: Self-pay

## 2021-08-18 ENCOUNTER — Encounter: Payer: Self-pay | Admitting: Podiatry

## 2021-08-18 DIAGNOSIS — M79674 Pain in right toe(s): Secondary | ICD-10-CM

## 2021-08-18 DIAGNOSIS — M79675 Pain in left toe(s): Secondary | ICD-10-CM

## 2021-08-18 DIAGNOSIS — B351 Tinea unguium: Secondary | ICD-10-CM | POA: Diagnosis not present

## 2021-08-18 DIAGNOSIS — M722 Plantar fascial fibromatosis: Secondary | ICD-10-CM

## 2021-08-18 NOTE — Patient Instructions (Signed)

## 2021-08-20 NOTE — Progress Notes (Signed)
Subjective: 70 y.o. returns the office today for painful, elongated, thickened toenails which she  cannot trim herself. Denies any redness or drainage around the nails.  Surgical site has been doing well.  He does get some pain in the arch of her foot intermittently.  No recent injury or trauma.  No increase in swelling.  No recent treatment.  No other concerns.  PCP: Denita Lung, MD   Objective: AAO 3, NAD DP/PT pulses palpable, CRT less than 3 seconds Nails hypertrophic, dystrophic, elongated, brittle, discolored 10. There is tenderness overlying the nails 1-5 bilaterally. There is no surrounding erythema or drainage along the nail sites. No open lesions or pre-ulcerative lesions are identified. Surgical site is well-healed there is no pain.   Unable to elicit any area tenderness today.  Upon palpation of the arch of the foot is where she gets discomfort.  No area pinpoint tenderness.  No edema, erythema.  Flexor, extensor tendons appear to be intact.  MMT 5/5. No pain with calf compression, swelling, warmth, erythema.  Assessment: Patient presents with symptomatic onychomycosis; arch pain, likely plantar fasciitis  Plan: -Treatment options including alternatives, risks, complications were discussed -Nails sharply debrided 10 without complication/bleeding. -In regards to the arch.  Discussed stretching, icing daily.  We discussed using good arch supports.  Discussed doing this on a regular basis.  If symptoms continue we will get x-rays. -Discussed daily foot inspection. If there are any changes, to call the office immediately.  -Follow-up in 3 months or sooner if any problems are to arise. In the meantime, encouraged to call the office with any questions, concerns, changes symptoms.  Celesta Gentile, DPM

## 2021-08-25 ENCOUNTER — Other Ambulatory Visit: Payer: Medicare Other

## 2021-08-25 NOTE — Progress Notes (Signed)
[facility] Note  08/26/2021     CHIEF COMPLAINT Patient presents for Retina Follow Up   HISTORY OF PRESENT ILLNESS: Kathy [name] is a 70 y.o. female who presents to the clinic today for:  HPI     Retina Follow Up   Patient presents with  Other.  In right eye.  This started 7 months ago.  I, the attending physician,  performed the HPI with the patient and updated documentation appropriately.        Comments   Patient her for 7 months retina follow up for retinoschisis OD. Patient states vision is coming. No eye pain. Was put on new blood pressure medicine. Amlodipine.      Last edited by Bernarda Caffey, MD on 08/28/2021  1:30 AM.    Pt states no change in vision, no new health problems   Referring physician: Gevena Cotton, MD [address] [place],  [place] [address]  HISTORICAL INFORMATION:   Selected notes from the MEDICAL RECORD NUMBER Referred by Dr. Gevena Cotton for concern of central areolar choroidal dystrophy LEE: 05.16.19 (M. Frederico Hamman) [BCVA: OD: 20/30-2 OS: CF] Ocular Hx-Central areolar choroidal dystrophy, Macular Degeneration, exotropia OS, cataracts OU, dry eyes PMH-HTN, Fibromyalgia    CURRENT MEDICATIONS: Current Outpatient Medications (Ophthalmic Drugs)  Medication Sig   cycloSPORINE (RESTASIS) 0.05 % ophthalmic emulsion Place 1 drop into both eyes 2 (two) times daily.   No current facility-administered medications for this visit. (Ophthalmic Drugs)   Current Outpatient Medications (Other)  Medication Sig   acetaminophen (TYLENOL) 500 MG tablet Take 2 tablets (1,000 mg total) by mouth every 8 (eight) hours as needed for moderate pain.   amLODipine (NORVASC) 5 MG tablet Take 1 tablet (5 mg total) by mouth daily.   docusate sodium (COLACE) 100 MG capsule Take 100 mg by mouth 2 (two) times daily as needed for mild constipation. (Patient not taking: No sig reported)   ketoconazole (NIZORAL) 2 % cream  Apply topically 2 (two) times daily. (Patient not taking: Reported on 08/11/2021)   Multiple Vitamins-Minerals (PRESERVISION AREDS 2) CAPS Take 1 capsule by mouth 2 (two) times a day.   ondansetron (ZOFRAN) 4 MG tablet Take 1 tablet (4 mg total) by mouth every 8 (eight) hours as needed for nausea or vomiting. (Patient not taking: No sig reported)   sulfamethoxazole-trimethoprim (BACTRIM DS) 800-160 MG tablet Take 1 tablet by mouth 2 (two) times daily.   triamcinolone cream (KENALOG) 0.1 % Apply topically 2 (two) times daily. (Patient not taking: Reported on 08/11/2021)   No current facility-administered medications for this visit. (Other)   REVIEW OF SYSTEMS: ROS   Positive for: Gastrointestinal, Musculoskeletal, Cardiovascular, Eyes Negative for: Constitutional, Neurological, Skin, Genitourinary, HENT, Endocrine, Respiratory, Psychiatric, Allergic/Imm, Heme/Lymph Last edited by Theodore Demark, COA on 08/26/2021  8:37 AM.      ALLERGIES Allergies  Allergen Reactions   Lortab [Hydrocodone-Acetaminophen] Nausea And Vomiting   Latex Other (See Comments)    Pt unsure of reaction!   PAST MEDICAL HISTORY Past Medical History:  Diagnosis Date   Chronic diastolic CHF (congestive heart failure) ([place]) 01/13/2021   Exotropia of left eye    Fibromyalgia    History of uterine leiomyoma    Hypertension    Macular degeneration    OA (osteoarthritis)    Overactive bladder    Sarcoidosis    Wears glasses    Past Surgical History:  Procedure Laterality Date   ABDOMINAL HYSTERECTOMY  2013   ADJUSTABLE SUTURE MANIPULATION Left 04/10/2015   Procedure: ADJUSTABLE SUTURE MANIPULATION;  Surgeon: Gevena Cotton, MD;  Location: [facility];  Service: Ophthalmology;  Laterality: Left;   CATARACT EXTRACTION Bilateral 05/2019   Dr. Gershon Crane   EYE SURGERY Bilateral 05/2019   Cat Sx - Dr. Gershon Crane   MEDIAN RECTUS REPAIR Left 04/10/2015   Procedure: MEDIAN RECTUS RESECTION LATERAL RECTUS  RECESSION,AJUSTABLES SUTURES LEFT EYE;  Surgeon: Gevena Cotton, MD;  Location: [facility];  Service: Ophthalmology;  Laterality: Left;   TONSILLECTOMY  as child   TOTAL ABDOMINAL HYSTERECTOMY W/ BILATERAL SALPINGOOPHORECTOMY  10-14-2010   and TVT midurethral sling and culdoplasty   TOTAL KNEE ARTHROPLASTY Left 06/15/2019   Procedure: TOTAL KNEE ARTHROPLASTY;  Surgeon: Paralee Cancel, MD;  Location: [facility];  Service: Orthopedics;  Laterality: Left;  70 mins    FAMILY HISTORY Family History  Problem Relation Age of Onset   Colon cancer Mother    Diabetes Father    Diabetes Sister    Hypertension Sister    Diabetes Sister    Hypertension Sister    Hypertension Sister     SOCIAL HISTORY Social History   Tobacco Use   Smoking status: Never   Smokeless tobacco: Never  Vaping Use   Vaping Use: Never used  Substance Use Topics   Alcohol use: Yes    Alcohol/week: 1.0 standard drink    Types: 1 Glasses of wine per week    Comment: rare   Drug use: No       OPHTHALMIC EXAM:  Base Eye Exam     Visual Acuity (Snellen - Linear)       Right Left   Dist cc 20/40 CF at 3'   Dist ph cc NI NI    Correction: Glasses         Tonometry (Tonopen, 8:33 AM)       Right Left   Pressure 17 14         Pupils       Dark Light Shape React APD   Right 3 2 Round Minimal None   Left 3 2 Round Minimal None         Visual Fields (Counting fingers)       Left Right    Full Full         Extraocular Movement       Right Left    Full, Ortho Full, Ortho         Neuro/Psych     Oriented x3: Yes   Mood/Affect: Normal         Dilation     Both eyes: 1.0% Mydriacyl, 2.5% Phenylephrine @ 8:33 AM           Slit Lamp and Fundus Exam     Slit Lamp Exam       Right Left   Lids/Lashes Dermatochalasis - upper lid Dermatochalasis - upper lid   Conjunctiva/Sclera mild Melanosis, prominent episcleral vessel inferiorly mild Melanosis, 1+ Injection    Cornea Arcus, Well healed temporal cataract wounds, trace PEE Arcus, linear scar at 0430, Well healed temporal cataract wounds, 1+ Punctate epithelial erosions   Anterior Chamber Deep and quiet Deep and quiet   Iris Round and dilated, patches of atrophy at 1130 and 0900 Round and dilated, mild patches of atrophy   Lens PC IOL in good position, mild pigment on optic PC IOL in good position, mild pigment on optic, 1-2+ Posterior capsular opacification  Vitreous Vitreous syneresis, Posterior vitreous detachment, vitreous condensations Vitreous syneresis         Fundus Exam       Right Left   Disc Sharp rim, mild pallor, mild PPP Tilted disc, mild pallor, sharp rim, 360 degrees of PPA   C/D Ratio 0.4 0.6   Macula large area of RPE and choroidal atrophy with small central foveal island spared; central island w/ pigment clumping, no heme large area of choroidal and retinal pigment epithelial atrophy extending to nasal side of disc -- stable from prior, Pigment clumping, No heme    Vessels attenuated, Tortuous attenuated, Tortuous   Periphery Attached,  0300 retinal break and +SRF / focal RD within bed of retinoschisis -- stable with good laser surrounding Attached              Refraction     Wearing Rx       Sphere Cylinder Axis Add   Right +0.25 +0.50 052 +2.50   Left +0.25 Sphere  +2.50    Type: PAL            IMAGING AND PROCEDURES  Imaging and Procedures for @TODAY @  OCT, Retina - OU - Both Eyes       Right Eye Quality was good. Central Foveal Thickness: 187. Progression has been stable. Findings include abnormal foveal contour, inner retinal atrophy, outer retinal atrophy, no SRF, no IRF, retinal drusen (diffuse macular atrophy with central island - shrinking, stable nasal schisis caught on widefield -- good laser surrounding).   Left Eye Quality was good. Central Foveal Thickness: 199. Progression has been stable. Findings include outer retinal atrophy, inner  retinal atrophy, abnormal foveal contour, no IRF, no SRF (Diffuse central atrophy; en face image stable).   Notes *Images captured and stored on drive  Diagnosis / Impression:  Diffuse macular atrophy OU -- Stable from prior OD with central island of non-atrophied retina -- shrinking? Central areolar choroidal dystrophy OU Retinoschisis nasal periphery OD -- caught on widefield  Clinical management:  See below  Abbreviations: NFP - Normal foveal profile. CME - cystoid macular edema. PED - pigment epithelial detachment. IRF - intraretinal fluid. SRF - subretinal fluid. EZ - ellipsoid zone. ERM - epiretinal membrane. ORA - outer retinal atrophy. ORT - outer retinal tubulation. SRHM - subretinal hyper-reflective material            ASSESSMENT/PLAN:    ICD-10-CM   1. Right retinoschisis  H33.101     2. Retinal detachment, right  H33.21     3. Retinal edema  H35.81 OCT, Retina - OU - Both Eyes    4. 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 Choroidal atrophy of both eyes  H31.103     6. Advanced atrophic nonexudative age-related macular degeneration of both eyes with subfoveal involvement  H35.3134     7. Pseudophakia of both eyes  Z96.1     8. Strabismus  H50.9      1-3. Localized, focal retinal detachment / SRF surrounding small retinal tear at 0330, within retinoschisis cavity, RIGHT EYE  - asymptomatic, but found on exam 5.23.19  - tear located at 0330 OD  - S/P laser retinopexy OD (05.23.19), S/P touch up laser retinopexy OD (06.28.19) -- good laser in place  - BCVA: 20/40   - no change in SRF/schisis-detachment   - f/u in 9 months -- repeat widefield OCT over 0300 schisis cavity  4-6. Severe outer retinal and choroidal atrophy OU -  stable today  - likely central areolar choroidal dystrophy -- pt notes declining vision that started in her 52-50s  - pt reports intermittent photopsias -- suspect may be related to progressive degeneration / dystrophy --  stably improved today  - differential also includes age related macular degeneration, non-exudative, both eyes  - The incidence, anatomy, and pathology of dry AMD, risk of progression, and the AREDS and AREDS 2 study including smoking risks discussed with patient.  - continue amsler grid monitoring   - f/u 9 mos  7. Pseudophakia OU  - s/p CE/IOL OU Gershon Crane, June 2020)  - beautiful surgeries, doing well  - monitor  8. Strabismus  - under the expert management of Dr. Frederico Hamman  - s/p EOM surgery 04/10/15  - monitor  Ophthalmic Meds Ordered this visit:  No orders of the defined types were placed in this encounter.     Return in about 9 months (around 05/26/2022) for f/u retinoschisis OD, DFE, OCT.  There are no Patient Instructions on file for this visit.  Explained the diagnoses, plan, and follow up with the patient and they expressed understanding.  Patient expressed understanding of the importance of proper follow up care.   This document serves as a record of services personally performed by Gardiner Sleeper, MD, PhD. It was created on their behalf by Estill Bakes, COT an ophthalmic technician. The creation of this record is the provider's dictation and/or activities during the visit.    Electronically signed by: Estill Bakes, COT 9.26.22 @ 1:35 AM   This document serves as a record of services personally performed by Gardiner Sleeper, MD, PhD. It was created on their behalf by San Jetty. Owens Shark, OA an ophthalmic technician. The creation of this record is the provider's dictation and/or activities during the visit.    Electronically signed by: San Jetty. Owens Shark, [place] 09.27.2022 1:35 AM   Gardiner Sleeper, M.D., Ph.D. Diseases & Surgery of the Retina and Vitreous [facility]  I have reviewed the above documentation for accuracy and completeness, and I agree with the above. Gardiner Sleeper, M.D., Ph.D. 08/28/21 1:35 AM  Abbreviations: M myopia (nearsighted); A  astigmatism; H hyperopia (farsighted); P presbyopia; Mrx spectacle prescription;  CTL contact lenses; OD right eye; OS left eye; OU both eyes  XT exotropia; ET esotropia; PEK punctate epithelial keratitis; PEE punctate epithelial erosions; DES dry eye syndrome; MGD meibomian gland dysfunction; ATs artificial tears; PFAT's preservative free artificial tears; [place] nuclear sclerotic cataract; PSC posterior subcapsular cataract; ERM epi-retinal membrane; PVD posterior vitreous detachment; RD retinal detachment; DM diabetes mellitus; DR diabetic retinopathy; NPDR non-proliferative diabetic retinopathy; PDR proliferative diabetic retinopathy; CSME clinically significant macular edema; DME diabetic macular edema; dbh dot blot hemorrhages; CWS cotton wool spot; POAG primary open angle glaucoma; C/D cup-to-disc ratio; HVF humphrey visual field; GVF goldmann visual field; OCT optical coherence tomography; IOP intraocular pressure; BRVO Branch retinal vein occlusion; CRVO central retinal vein occlusion; CRAO central retinal artery occlusion; BRAO branch retinal artery occlusion; RT retinal tear; SB scleral buckle; PPV pars plana vitrectomy; VH Vitreous hemorrhage; PRP panretinal laser photocoagulation; IVK intravitreal kenalog; VMT vitreomacular traction; MH Macular hole;  NVD neovascularization of the disc; NVE neovascularization elsewhere; AREDS age related eye disease study; ARMD age related macular degeneration; POAG primary open angle glaucoma; EBMD epithelial/anterior basement membrane dystrophy; ACIOL anterior chamber intraocular lens; IOL intraocular lens; PCIOL posterior chamber intraocular lens; Phaco/IOL phacoemulsification with intraocular lens placement; PRK photorefractive keratectomy; LASIK laser assisted in  situ keratomileusis; HTN hypertension; DM diabetes mellitus; COPD chronic obstructive pulmonary disease

## 2021-08-26 ENCOUNTER — Encounter (INDEPENDENT_AMBULATORY_CARE_PROVIDER_SITE_OTHER): Payer: Self-pay | Admitting: Ophthalmology

## 2021-08-26 ENCOUNTER — Ambulatory Visit (INDEPENDENT_AMBULATORY_CARE_PROVIDER_SITE_OTHER): Payer: Medicare Other | Admitting: Ophthalmology

## 2021-08-26 ENCOUNTER — Other Ambulatory Visit: Payer: Self-pay

## 2021-08-26 DIAGNOSIS — H509 Unspecified strabismus: Secondary | ICD-10-CM | POA: Diagnosis not present

## 2021-08-26 DIAGNOSIS — H31103 Choroidal degeneration, unspecified, bilateral: Secondary | ICD-10-CM

## 2021-08-26 DIAGNOSIS — H3321 Serous retinal detachment, right eye: Secondary | ICD-10-CM

## 2021-08-26 DIAGNOSIS — Z961 Presence of intraocular lens: Secondary | ICD-10-CM | POA: Diagnosis not present

## 2021-08-26 DIAGNOSIS — H353134 Nonexudative age-related macular degeneration, bilateral, advanced atrophic with subfoveal involvement: Secondary | ICD-10-CM

## 2021-08-26 DIAGNOSIS — H33101 Unspecified retinoschisis, right eye: Secondary | ICD-10-CM | POA: Diagnosis not present

## 2021-08-26 DIAGNOSIS — H3122 Choroidal dystrophy (central areolar) (generalized) (peripapillary): Secondary | ICD-10-CM | POA: Diagnosis not present

## 2021-08-26 DIAGNOSIS — H3581 Retinal edema: Secondary | ICD-10-CM

## 2021-08-28 ENCOUNTER — Encounter (INDEPENDENT_AMBULATORY_CARE_PROVIDER_SITE_OTHER): Payer: Self-pay | Admitting: Ophthalmology

## 2021-09-03 ENCOUNTER — Encounter: Payer: Self-pay | Admitting: Family Medicine

## 2021-09-03 ENCOUNTER — Other Ambulatory Visit: Payer: Self-pay

## 2021-09-03 ENCOUNTER — Ambulatory Visit (INDEPENDENT_AMBULATORY_CARE_PROVIDER_SITE_OTHER): Payer: Medicare Other | Admitting: Family Medicine

## 2021-09-03 VITALS — BP 134/80 | HR 81 | Temp 97.9°F | Ht 62.75 in | Wt 169.2 lb

## 2021-09-03 DIAGNOSIS — Z0000 Encounter for general adult medical examination without abnormal findings: Secondary | ICD-10-CM | POA: Diagnosis not present

## 2021-09-03 DIAGNOSIS — H5010 Unspecified exotropia: Secondary | ICD-10-CM | POA: Diagnosis not present

## 2021-09-03 DIAGNOSIS — H3530 Unspecified macular degeneration: Secondary | ICD-10-CM

## 2021-09-03 DIAGNOSIS — I5032 Chronic diastolic (congestive) heart failure: Secondary | ICD-10-CM | POA: Diagnosis not present

## 2021-09-03 DIAGNOSIS — J301 Allergic rhinitis due to pollen: Secondary | ICD-10-CM | POA: Diagnosis not present

## 2021-09-03 DIAGNOSIS — M1990 Unspecified osteoarthritis, unspecified site: Secondary | ICD-10-CM | POA: Diagnosis not present

## 2021-09-03 DIAGNOSIS — M797 Fibromyalgia: Secondary | ICD-10-CM

## 2021-09-03 DIAGNOSIS — Z23 Encounter for immunization: Secondary | ICD-10-CM | POA: Diagnosis not present

## 2021-09-03 DIAGNOSIS — Z96652 Presence of left artificial knee joint: Secondary | ICD-10-CM

## 2021-09-03 DIAGNOSIS — Z9849 Cataract extraction status, unspecified eye: Secondary | ICD-10-CM | POA: Insufficient documentation

## 2021-09-03 DIAGNOSIS — D860 Sarcoidosis of lung: Secondary | ICD-10-CM

## 2021-09-03 DIAGNOSIS — I10 Essential (primary) hypertension: Secondary | ICD-10-CM | POA: Diagnosis not present

## 2021-09-03 DIAGNOSIS — I700 Atherosclerosis of aorta: Secondary | ICD-10-CM

## 2021-09-03 MED ORDER — AMLODIPINE BESYLATE 5 MG PO TABS: 5.0000 mg | ORAL_TABLET | Freq: Every day | ORAL | 3 refills | Status: DC

## 2021-09-03 MED ORDER — ATORVASTATIN CALCIUM 20 MG PO TABS: 20.0000 mg | ORAL_TABLET | Freq: Every day | ORAL | 3 refills | Status: DC

## 2021-09-03 NOTE — Progress Notes (Signed)
Kathy [name] is a 70 y.o. female who presents for annual wellness visit,CPE and follow-up on chronic medical conditions.  She has no particular concerns or complaints.  She did have an episode of syncope earlier this year and did get a work-up for that.  Part of the work-up did show evidence of aortic atherosclerosis.  She also has evidence of chronic diastolic CHF.  Presently she is having no chest pain or shortness of breath.  She continues on amlodipine.  She also has a history of colonic polyps and is scheduled for follow-up on that.  She has had her cataracts removed and seems to doing quite nicely.  She also has evidence of macular degeneration and presently does not drive because of her eye problems.  She does have a history of pulmonary sarcoid but no recent difficulty.  Her allergies are under good control.  She is not having any more knee pain.  She does keep her self busy but is not involved in a regular exercise program.  Immunizations and Health Maintenance Immunization History  Administered Date(s) Administered   Fluad Quad(high Dose 65+) 08/31/2019, 09/02/2020   Influenza Split 08/28/2011, 09/23/2012   Influenza, High Dose Seasonal PF 09/30/2016, 09/01/2017, 08/24/2018   Influenza,inj,Quad PF,6+ Mos 09/13/2013, 08/30/2014, 09/10/2015   PFIZER Comirnaty(Gray Top)Covid-19 Tri-Sucrose Vaccine 03/25/2021   PFIZER(Purple Top)SARS-COV-2 Vaccination 01/23/2020, 02/13/2020, 09/02/2020   PPD Test 01/16/2021   Pneumococcal Conjugate-13 08/30/2014   Pneumococcal Polysaccharide-23 02/03/2016   Tdap 02/03/2016   Zoster Recombinat (Shingrix) 09/16/2017   Zoster, Live 01/28/2012   Health Maintenance Due  Topic Date Due   INFLUENZA VACCINE  06/30/2021    Last Pap smear: aged out  Last mammogram: 04/02/21 Last colonoscopy: 12/14/2016 Last DEXA: 10/11/18 Dentist: Q six months Ophtho: Q six months  Exercise: walking three days a week   Other doctors caring for patient include: Dr. Henrene Pastor  GI, Dr. Micah Noel eye, Dr. Jacqualyn Posey podiatry, Dr. Gasper Sells cardiology  Advanced directives: Does Patient Have a Medical Advance Directive?: No Would patient like information on creating a medical advance directive?: Yes (ED - Information included in AVS) Again discussed the need to get a living will. Depression screen:  See questionnaire below.  Depression screen [facility] 2/9 09/03/2021 03/25/2021 09/02/2020 08/31/2019 08/24/2018  Decreased Interest 0 0 0 0 0  Down, Depressed, Hopeless 0 0 0 0 0  PHQ - 2 Score 0 0 0 0 0    Fall Risk Screen: see questionnaire below. Fall Risk  09/03/2021 09/02/2020 08/31/2019 04/13/2017  Falls in the past year? 0 1 0 No  Number falls in past yr: 0 0 - -  Injury with Fall? 0 0 - -  Risk for fall due to : History of fall(s) - - -  Follow up Falls evaluation completed - - -    ADL screen:  See questionnaire below Functional Status Survey: Is the patient deaf or have difficulty hearing?: No Does the patient have difficulty seeing, even when wearing glasses/contacts?: No Does the patient have difficulty concentrating, remembering, or making decisions?: No Does the patient have difficulty walking or climbing stairs?: No Does the patient have difficulty dressing or bathing?: No Does the patient have difficulty doing errands alone such as visiting a doctor's office or shopping?: No   Review of Systems Constitutional: -, -unexpected weight change, -anorexia, -fatigue Allergy: -sneezing, -itching, -congestion Dermatology: denies changing moles, rash, lumps ENT: -runny nose, -ear pain, -sore throat,  Cardiology:  -chest pain, -palpitations, -orthopnea, Respiratory: -cough, -shortness of breath, -dyspnea  on exertion, -wheezing,  Gastroenterology: -abdominal pain, -nausea, -vomiting, -diarrhea, -constipation, -dysphagia Hematology: -bleeding or bruising problems Musculoskeletal: -arthralgias, -myalgias, -joint swelling, -back pain, - Ophthalmology: -vision changes,   Urology: -dysuria, -difficulty urinating,  -urinary frequency, -urgency, incontinence Neurology: -, -numbness, , -memory loss, -falls, -dizziness    PHYSICAL EXAM: General Appearance: Alert, cooperative, no distress, appears stated age Head: Normocephalic, without obvious abnormality, atraumatic Eyes: PERRL, conjunctiva/corneas clear, EOM's intact, esotropia is noted. Ears: Normal TM's and external ear canals Nose: Nares normal, mucosa normal, no drainage or sinus tenderness Throat: Lips, mucosa, and tongue normal; teeth and gums normal Neck: Supple, no lymphadenopathy;  thyroid:  no enlargement/tenderness/nodules; no carotid bruit or JVD Lungs: Clear to auscultation bilaterally without wheezes, rales or ronchi; respirations unlabored Heart: Regular rate and rhythm, S1 and S2 normal, no murmur, rubor gallop Skin:  Skin color, texture, turgor normal, no rashes or lesions Lymph nodes: Cervical, supraclavicular, and axillary nodes normal Neurologic:  CNII-XII intact, normal strength, sensation and gait; reflexes 2+ and symmetric throughout Psych: Normal mood, affect, hygiene and grooming.  ASSESSMENT/PLAN: Routine general medical examination at a health care facility  Pulmonary sarcoidosis (HCC)  Fibromyalgia  Allergic rhinitis due to pollen, unspecified seasonality  S/P left TKA  Need for influenza vaccination - Plan: Flu Vaccine QUAD High Dose(Fluad)  Need for COVID-19 vaccine - Plan: Pension scheme manager  Macular degeneration, unspecified laterality, unspecified type  Arthritis  Chronic diastolic CHF (congestive heart failure) (HCC)  Aortic atherosclerosis ([place]) - Plan: Lipid panel, atorvastatin (LIPITOR) 20 MG tablet  Essential hypertension - Plan: amLODipine (NORVASC) 5 MG tablet  Status post cataract extraction, unspecified laterality  Exotropia Discussed the fact that she has evidence of atherosclerosis and need for Lipitor.   Discussed  at  least 30 minutes of aerobic activity at least 5 days/week and weight-bearing exercise 2x/week;  healthy diet, including goals of calcium and vitamin D intake and alcohol recommendations (less than or equal to 1 drink/day) reviewed; Immunization recommendations discussed.  Colonoscopy recommendations reviewed   Medicare Attestation I have personally reviewed: The patient's medical and social history Their use of alcohol, tobacco or illicit drugs Their current medications and supplements The patient's functional ability including ADLs,fall risks, home safety risks, cognitive, and hearing and visual impairment Diet and physical activities Evidence for depression or mood disorders  The patient's weight, height, and BMI have been recorded in the chart.  I have made referrals, counseling, and provided education to the patient based on review of the above and I have provided the patient with a written personalized care plan for preventive services.     Jill Alexanders, MD   09/03/2021

## 2021-09-04 LAB — LIPID PANEL
Chol/HDL Ratio: 2.8 ratio (ref 0.0–4.4)
Cholesterol, Total: 209 mg/dL — ABNORMAL HIGH (ref 100–199)
HDL: 75 mg/dL (ref >39)
LDL Chol Calc (NIH): 117 mg/dL — ABNORMAL HIGH (ref 0–99)
Triglycerides: 96 mg/dL (ref 0–149)
VLDL Cholesterol Cal: 17 mg/dL (ref 5–40)

## 2021-09-22 DIAGNOSIS — H353111 Nonexudative age-related macular degeneration, right eye, early dry stage: Secondary | ICD-10-CM | POA: Diagnosis not present

## 2021-09-22 DIAGNOSIS — H5211 Myopia, right eye: Secondary | ICD-10-CM | POA: Diagnosis not present

## 2021-09-22 DIAGNOSIS — H52201 Unspecified astigmatism, right eye: Secondary | ICD-10-CM | POA: Diagnosis not present

## 2021-09-22 DIAGNOSIS — H524 Presbyopia: Secondary | ICD-10-CM | POA: Diagnosis not present

## 2021-09-22 DIAGNOSIS — H04123 Dry eye syndrome of bilateral lacrimal glands: Secondary | ICD-10-CM | POA: Diagnosis not present

## 2021-09-22 DIAGNOSIS — H353124 Nonexudative age-related macular degeneration, left eye, advanced atrophic with subfoveal involvement: Secondary | ICD-10-CM | POA: Diagnosis not present

## 2021-09-22 DIAGNOSIS — Z961 Presence of intraocular lens: Secondary | ICD-10-CM | POA: Diagnosis not present

## 2021-10-09 ENCOUNTER — Telehealth: Payer: Self-pay | Admitting: Family Medicine

## 2021-10-09 NOTE — Chronic Care Management (AMB) (Signed)
  Chronic Care Management   Outreach Note  10/09/2021 Name: Kathy [name] MRN: [id number] DOB: 31-Jul-1951  Referred by: Denita Lung, MD Reason for referral : No chief complaint on file.   An unsuccessful telephone outreach was attempted today. The patient was referred to the pharmacist for assistance with care management and care coordination.   Follow Up Plan:   Tatjana Dellinger Upstream Scheduler

## 2021-10-16 ENCOUNTER — Telehealth: Payer: Self-pay | Admitting: Family Medicine

## 2021-10-16 NOTE — Chronic Care Management (AMB) (Signed)
  Chronic Care Management   Outreach Note  10/16/2021 Name: Kathy [name] MRN: [id number] DOB: 1951/07/18  Referred by: Denita Lung, MD Reason for referral : No chief complaint on file.   A second unsuccessful telephone outreach was attempted today. The patient was referred to pharmacist for assistance with care management and care coordination.  Follow Up Plan:   Tatjana Dellinger Upstream Scheduler

## 2021-10-21 ENCOUNTER — Telehealth: Payer: Self-pay | Admitting: Family Medicine

## 2021-10-21 NOTE — Progress Notes (Signed)
  Chronic Care Management   Outreach Note  10/21/2021 Name: Kathy [name] MRN: [id number] DOB: 1951/07/08  Referred by: Denita Lung, MD Reason for referral : No chief complaint on file.   Third unsuccessful telephone outreach was attempted today. The patient was referred to the pharmacist for assistance with care management and care coordination.   Follow Up Plan:   Tatjana Dellinger Upstream Scheduler

## 2021-11-17 ENCOUNTER — Ambulatory Visit: Payer: Medicare Other | Admitting: Podiatry

## 2021-12-23 ENCOUNTER — Ambulatory Visit (INDEPENDENT_AMBULATORY_CARE_PROVIDER_SITE_OTHER): Payer: Medicare Other | Admitting: Family Medicine

## 2021-12-23 ENCOUNTER — Other Ambulatory Visit: Payer: Self-pay

## 2021-12-23 ENCOUNTER — Encounter: Payer: Self-pay | Admitting: Family Medicine

## 2021-12-23 VITALS — BP 132/88 | HR 110 | Temp 97.8°F | Wt 167.0 lb

## 2021-12-23 DIAGNOSIS — R1013 Epigastric pain: Secondary | ICD-10-CM

## 2021-12-23 MED ORDER — HYOSCYAMINE SULFATE ER 0.375 MG PO TB12: 0.3750 mg | ORAL_TABLET | Freq: Two times a day (BID) | ORAL | 0 refills | Status: DC

## 2021-12-23 NOTE — Progress Notes (Signed)
° °  Subjective:    Patient ID: Kathy [name], female    DOB: 1951-08-14, 71 y.o.   MRN: [id number]  HPI She complains of a 1 week history of intermittent abdominal pain with gas and diarrhea.  She cannot specifically relate this to any particular foods.  No fever or chills, blood or pus in her stool.   Review of Systems     Objective:   Physical Exam Alert and in no distress.  Cardiac and lung exam normal.  Abdominal exam shows active bowel sounds with negative Murphy sign and Murphy's punch.       Assessment & Plan:  Epigastric pain - Plan: hyoscyamine (LEVBID) 0.375 MG 12 hr tablet Discussed using this medication to help with her symptoms but also pay attention to anything that makes it better or worse and keep me informed.  I explained that at this point it is not clear-cut history of exactly what was causing her symptoms.

## 2022-03-12 ENCOUNTER — Ambulatory Visit: Payer: Medicare Other | Admitting: Podiatry

## 2022-03-12 DIAGNOSIS — B351 Tinea unguium: Secondary | ICD-10-CM | POA: Diagnosis not present

## 2022-03-12 DIAGNOSIS — M79675 Pain in left toe(s): Secondary | ICD-10-CM

## 2022-03-12 DIAGNOSIS — M79674 Pain in right toe(s): Secondary | ICD-10-CM | POA: Diagnosis not present

## 2022-03-12 DIAGNOSIS — M722 Plantar fascial fibromatosis: Secondary | ICD-10-CM | POA: Diagnosis not present

## 2022-03-12 NOTE — Patient Instructions (Signed)

## 2022-03-15 NOTE — Progress Notes (Signed)
Subjective: ?71 y.o. returns the office today for painful, elongated, thickened toenails which she  cannot trim herself. Denies any redness or drainage around the nails.  States that she is still been getting some arch pain on the right foot intermittently.  No new injuries or concerns otherwise.  ? ?PCP: Denita Lung, MD ? ? ?Objective: ?AAO ?3, NAD ?DP/PT pulses palpable, CRT less than 3 seconds ?Nails hypertrophic, dystrophic, elongated, brittle, discolored ?10. There is tenderness overlying the nails 1-5 bilaterally. There is no surrounding erythema or drainage along the nail sites. ?No open lesions or pre-ulcerative lesions are identified. ?Surgical site is well-healed there is no pain.   ?No area pinpoint tenderness noted today.  Upon palpation of the arch of the foot is where she gets discomfort.  No area pinpoint tenderness.  No edema, erythema.  Flexor, extensor tendons appear to be intact.  MMT 5/5. ?No pain with calf compression, swelling, warmth, erythema. ? ?Assessment: ?Patient presents with symptomatic onychomycosis; arch pain, likely plantar fasciitis ? ?Plan: ?-Treatment options including alternatives, risks, complications were discussed ?-Nails sharply debrided ?10 without complication/bleeding. ?-In regards to the arch.  Discussed stretching, icing daily.  Continue with shoes and good arch supports.  Referral to physical therapy.   ?-Discussed daily foot inspection. If there are any changes, to call the office immediately.  ?-Follow-up in 3 months or sooner if any problems are to arise. In the meantime, encouraged to call the office with any questions, concerns, changes symptoms. ? ?*X-ray feet if still having arch pain, Planter fasciitis next appointment ? ?Celesta Gentile, DPM ? ?

## 2022-03-25 NOTE — Therapy (Addendum)
OUTPATIENT PHYSICAL THERAPY LOWER EXTREMITY EVALUATION/DISCHARGE NOTE   Patient Name: Kathy [name] MRN: [id number] DOB:Oct 03, 1951, 71 y.o., female Today's Date: 03/26/2022 PHYSICAL THERAPY DISCHARGE SUMMARY  Visits from Start of Care: 1  Current functional level related to goals / functional outcomes: unknown  Remaining deficits: Unknown   Education / Equipment: Iniital HEP   Patient agrees to discharge. Patient goals were not met. Patient is being discharged due to not returning since the last visit.   PT End of Session - 03/26/22 1418     Visit Number 1    Number of Visits 13    Date for PT Re-Evaluation 05/07/22    Authorization Type [employer] MCR  6th visit FOTO 10th visit FOTO and Progress note    Progress Note Due on Visit 10    PT Start Time 1103    PT Stop Time 1146    PT Time Calculation (min) 43 min    Activity Tolerance Patient tolerated treatment well    Behavior During Therapy WFL for tasks assessed/performed             Past Medical History:  Diagnosis Date   Chronic diastolic CHF (congestive heart failure) ([place]) 01/13/2021   Exotropia of left eye    Fibromyalgia    History of uterine leiomyoma    Hypertension    Macular degeneration    OA (osteoarthritis)    Overactive bladder    Sarcoidosis    Wears glasses    Past Surgical History:  Procedure Laterality Date   ABDOMINAL HYSTERECTOMY  2013   ADJUSTABLE SUTURE MANIPULATION Left 04/10/2015   Procedure: ADJUSTABLE SUTURE MANIPULATION;  Surgeon: Gevena Cotton, MD;  [facility];  Service: Ophthalmology;  Laterality: Left;   CATARACT EXTRACTION Bilateral 05/2019   Dr. Gershon Crane   EYE SURGERY Bilateral 05/2019   Cat Sx - Dr. Gershon Crane   MEDIAN RECTUS REPAIR Left 04/10/2015   Procedure: MEDIAN RECTUS RESECTION LATERAL RECTUS RECESSION,AJUSTABLES SUTURES LEFT EYE;  Surgeon: Gevena Cotton, MD;  Location: [facility];  Service: Ophthalmology;  Laterality: Left;    TONSILLECTOMY  as child   TOTAL ABDOMINAL HYSTERECTOMY W/ BILATERAL SALPINGOOPHORECTOMY  10-14-2010   and TVT midurethral sling and culdoplasty   TOTAL KNEE ARTHROPLASTY Left 06/15/2019   Procedure: TOTAL KNEE ARTHROPLASTY;  Surgeon: Paralee Cancel, MD;  Location: [facility];  Service: Orthopedics;  Laterality: Left;  70 mins   Patient Active Problem List   Diagnosis Date Noted   Status post cataract extraction 09/03/2021   Aortic atherosclerosis ([place]) 02/03/2021   Chronic diastolic CHF (congestive heart failure) ([place]) 01/13/2021   Hallux rigidus of right foot 01/13/2021   Obese 06/16/2019   S/P left TKA 06/15/2019   Arthritis 10/28/2016   Allergic rhinitis due to pollen 02/03/2016   Exotropia 04/08/2015   Macular degeneration 04/27/2012   Fibromyalgia 02/22/2012   Pulmonary sarcoidosis ([place]) 07/09/2010   Essential hypertension 07/09/2010    PCP: Denita Lung, MD  REFERRING PROVIDER: Trula Slade, DPM  REFERRING DIAG: M72.2 (ICD-10-CM) - Plantar fasciitis, right  THERAPY DIAG:  Pain in right foot  Muscle weakness (generalized)  Difficulty in walking, not elsewhere classified  ONSET DATE: approximately 6 months of pain of R foot pain near anterior heel  SUBJECTIVE:   SUBJECTIVE STATEMENT: I have had come and go pain in bottom of my R foot with or without shoes. I used to go the [employer] before COVID. 5 days a week and I walked on Sat and Sun.  I now only go walking about 40 min 3 days week.  I walk with my neighbor who is 48 yo. I have changed shoes in the last 6 months. My MD recommended I get some Brooks tennis shoes.  PERTINENT HISTORY: HTN, macular degeneration, CHF hx, Sarcoidosis hx, OA, Left TKA 2020  PAIN:  Are you having pain? Yes: NPRS scale: 0/10 at rest but 5 to 9/10 when I am up walking 5/10 Pain location: R plantar surface of foot anterior to heel Pain description: sharp, throbbing Aggravating factors: daily chores, walking, standing for longer than 15  min, I get stiff so I get up every 15 min, I can walk for 40 min but then I have pain Relieving factors: salve and soaking and tylenol  PRECAUTIONS: None  WEIGHT BEARING RESTRICTIONS No  FALLS:  Has patient fallen in last 6 months? 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  PLOF: Independent  PATIENT GOALS strengthen my body and get rid of pain in feet so I can exercise better and return to the [employer]   OBJECTIVE:   DIAGNOSTIC FINDINGS: none recent for R foot  PATIENT SURVEYS:  FOTO 49% predicted 67%  COGNITION:  Overall cognitive status: Within functional limits for tasks assessed     SENSATION: WFL  MUSCLE LENGTH: Hamstrings: WNL bil   POSTURE:  Slender ectomorphic body, Pt with bil flat feet with R>L  PALPATION: TTP over anterior aspect of plantar surface of R heel  LE ROM:  Active ROM Right 03/26/2022 Left 03/26/2022  Hip flexion WNL WNL  Hip extension    Hip abduction    Hip adduction    Hip internal rotation 45 45  Hip external rotation 45 45  Knee flexion 117/P 127 111/P119  Knee extension    Ankle dorsiflexion 0 6  Ankle plantarflexion 39 50  Ankle inversion 17 22  Ankle eversion 15 20   (Blank rows = not tested)  LE MMT:  MMT Right 03/26/2022 Left 03/26/2022  Hip flexion 4 4  Hip extension 4- 4-  Hip abduction 4- 4-  Hip adduction    Hip internal rotation    Hip external rotation    Knee flexion 4+ 4+  Knee extension 4+ 4+  Ankle dorsiflexion 4- 4+  Ankle plantarflexion 5 before too painful to perform 15  Ankle inversion 4 4+  Ankle eversion 4- 4+   (Blank rows = not tested)  LOWER EXTREMITY SPECIAL TESTS:  NT  FUNCTIONAL TESTS:  5 times sit to stand: TUG 14.15   6 min walk test   TBD    Pt unable to squat past knee flex 60 degrees and wt bears to Left, limited DF on R GAIT: Distance walked: 200 Assistive device utilized: None Level of assistance: Complete Independence Comments: Pt walks with flat foot gait    TODAY'S TREATMENT: 03-26-22 Issue HEP and EVAL   PATIENT EDUCATION:  Education details: POC, Explanation of findings, Initial HEP issued Person educated: Patient Education method: Explanation, Demonstration, Tactile cues, Verbal cues, and Handouts Education comprehension: verbalized understanding, returned demonstration, verbal cues required, tactile cues required, and needs further education   HOME EXERCISE PROGRAM: Access Code: [email or web address] URL: [email or web address] Date: 03/26/2022 Prepared by: Voncille Lo  Exercises - Long Sitting Ankle  Eversion with Resistance  - 1 x daily - 7 x weekly - 3 sets - 10 reps - 5  hold - Long Sitting Ankle Inversion with Resistance  - 1 x daily - 7 x weekly - 3 sets - 10 reps - 5 hold - Long Sitting Ankle Plantar Flexion with Resistance  - 1 x daily - 7 x weekly - 3 sets - 10 reps - 5 hold - Long Sitting Ankle Dorsiflexion with Anchored Resistance  - 1 x daily - 7 x weekly - 3 sets - 10 reps - 5 hold - Seated Arch Lifts  - 1 x daily - 7 x weekly - 3 sets - 10 reps - 5 hold - Towel Scrunches  - 1 x daily - 7 x weekly - 3 sets - 10 reps - 5 hold - Ankle Inversion Eversion Towel Slide  - 1 x daily - 7 x weekly - 3 sets - 10 reps - 5 hold - Ankle Inversion Eversion Towel Slide  - 1 x daily - 7 x weekly - 3 sets - 10 reps - 5 hold - Eccentric Heel Lowering on Step  - 1 x daily - 7 x weekly - 3 sets - 10 reps - 5 hold - Eccentric Bent Knee Calf Raise on Step  - 1 x daily - 7 x weekly - 3 sets - 10 reps - 5 hold  Patient Education - Plantar Fasciitis  ASSESSMENT:  CLINICAL IMPRESSION: Patient is a 71 y.o. female who was seen today for physical therapy evaluation and treatment for R plantar fasciitis.   Pt has been having about a year of intermittent sharp pain on anterior heel at plantar fascia insertion on R foot. Pt was quite active prior to COVID at the [facility] 5 days a week and walking on weekends. But has not exercised as frequently ( only 3 days of walking 40 min a week)  Pt with bil flat feet and decreased DF R less than L. Pt also with R foot weakness and decreased tolerance for standing greater than 15 min.  Pt will benefit from skilled PT to address impairments and maximize strength and function.   OBJECTIVE IMPAIRMENTS decreased activity tolerance, decreased knowledge of condition, decreased mobility, difficulty walking, decreased ROM, decreased strength, increased fascial restrictions, and pain.   ACTIVITY LIMITATIONS cleaning, meal prep, laundry, yard work, and walking for exercise .   PERSONAL FACTORS HTN, macular degeneration, CHF hx, Sarcoidosis hx, OA, Left TKA 2020 are also affecting patient's functional outcome.    REHAB POTENTIAL: Good  CLINICAL DECISION MAKING: Stable/uncomplicated  EVALUATION COMPLEXITY: Low   GOALS: Goals reviewed with patient? Yes  SHORT TERM GOALS: Target date: 04/16/2022  Independent with initial HEP Baseline:no knowledge Goal status: INITIAL  2.  Pain will be reduced to highest level of 6/10 from 9/10 with standing for 15 min Baseline: 9/10 with 15 min standing Goal status: INITIAL  3.  Pt will be able ascend/descend steps with 5/10 pain or less Baseline: steps 6/10 to 9/10 Goal status: INITIAL    LONG TERM GOALS: Target date: 05/07/2022  Pt will be independent with advanced HEP.  Baseline: no knowledge Goal status: INITIAL  2.  Pain will decrease to 2/10  or less with all functional activities/household chores Baseline: Pain ranges from 6/10 to 9/10 with movement and standing greater than 15 min Goal status: INITIAL  3.  Patient will demonstrate full Dorsiflexion AROM equal to opposite side in order to normalize gait and other  movement mechanics such as squatting Baseline: Pt unable to squat past knee flex 60 degrees and wt bears to Left Goal status: INITIAL  4.  FOTO will improve from 49%   to  67%   indicating improved functional mobility .  Baseline: eval 49% Goal status: INITIAL  5.  Pt will be able to negotiate steps  and carrying 15 # of groceries without exacerbating pain Baseline: avoids steps due to pain Goal status: INITIAL  6.  Pt will be able to tolerate standing on feet with pain no greater than 2/10 for 1 hour in order to return to exercise at [facility] Baseline: Pt unable to exercise routinely since COVID began but was consistently exercising 5-7 days a week  Goal status: INITIAL   PLAN: PT FREQUENCY: 2x/week  PT DURATION: 6 weeks  PLANNED INTERVENTIONS: Therapeutic exercises, Therapeutic activity, Neuromuscular re-education, Balance training, Gait training, Patient/Family education, Joint mobilization, Stair training, Dry Needling, Electrical stimulation, Cryotherapy, Moist heat, Taping, Ionotophoresis 46m/ml Dexamethasone, and Manual therapy  PLAN FOR NEXT SESSION: 6MWT, Review HEP  Add plantar fasciits stretch and heel raises   LVoncille Lo PT, AEmmettCertified Exercise Expert for the Aging Adult  03/26/22 2:51 PM Phone: [phone]: [phone]  LVoncille Lo PPuerto de Luna AMonomoscoy IslandCertified Exercise Expert for the Aging Adult  05/14/22 3:45 PM Phone: [phone]: [phone]

## 2022-03-26 ENCOUNTER — Ambulatory Visit: Payer: Medicare Other | Attending: Family Medicine | Admitting: Physical Therapy

## 2022-03-26 ENCOUNTER — Encounter: Payer: Self-pay | Admitting: Physical Therapy

## 2022-03-26 DIAGNOSIS — M79671 Pain in right foot: Secondary | ICD-10-CM | POA: Diagnosis not present

## 2022-03-26 DIAGNOSIS — R262 Difficulty in walking, not elsewhere classified: Secondary | ICD-10-CM | POA: Insufficient documentation

## 2022-03-26 DIAGNOSIS — M6281 Muscle weakness (generalized): Secondary | ICD-10-CM | POA: Diagnosis not present

## 2022-03-26 DIAGNOSIS — M722 Plantar fascial fibromatosis: Secondary | ICD-10-CM | POA: Insufficient documentation

## 2022-03-26 NOTE — Patient Instructions (Signed)
Access Code: [email or web address] ?URL: [email or web address] ?Date: 03/26/2022 ?Prepared by: Voncille Lo ? ?Exercises ?- Long Sitting Ankle Eversion with Resistance  - 1 x daily - 7 x weekly - 3 sets - 10 reps - 5  hold ?- Long Sitting Ankle Inversion with Resistance  - 1 x daily - 7 x weekly - 3 sets - 10 reps - 5 hold ?- Long Sitting Ankle Plantar Flexion with Resistance  - 1 x daily - 7 x weekly - 3 sets - 10 reps - 5 hold ?- Long Sitting Ankle Dorsiflexion with Anchored Resistance  - 1 x daily - 7 x weekly - 3 sets - 10 reps - 5 hold ?- Seated Arch Lifts  - 1 x daily - 7 x weekly - 3 sets - 10 reps - 5 hold ?- Towel Scrunches  - 1 x daily - 7 x weekly - 3 sets - 10 reps - 5 hold ?- Ankle Inversion Eversion Towel Slide  - 1 x daily - 7 x weekly - 3 sets - 10 reps - 5 hold ?- Ankle Inversion Eversion Towel Slide  - 1 x daily - 7 x weekly - 3 sets - 10 reps - 5 hold ?- Eccentric Heel Lowering on Step  - 1 x daily - 7 x weekly - 3 sets - 10 reps - 5 hold ?- Eccentric Bent Knee Calf Raise on Step  - 1 x daily - 7 x weekly - 3 sets - 10 reps - 5 hold ? ?Patient Education ?- Plantar Fasciitis ? ? ?Voncille Lo, PT, ATRIC ?Certified Exercise Expert for the Aging Adult  ?03/26/22 11:12 AM ?Phone: [phone] ?Fax: [phone]  ?

## 2022-04-06 ENCOUNTER — Encounter: Payer: Self-pay | Admitting: Internal Medicine

## 2022-04-07 ENCOUNTER — Telehealth: Payer: Self-pay | Admitting: Physical Therapy

## 2022-04-07 NOTE — Telephone Encounter (Signed)
Kathy [name] called in and stated that she wanted to cancel all schedule appts. I asked patient if she wanted to give a reason for the cancellations, she stated that she was not satisfied with the care she was receiving. I informed patient that all appts will be canceled and she will be discharged.  ?

## 2022-04-08 DIAGNOSIS — Z1231 Encounter for screening mammogram for malignant neoplasm of breast: Secondary | ICD-10-CM | POA: Diagnosis not present

## 2022-04-08 LAB — HM MAMMOGRAPHY

## 2022-04-08 NOTE — Telephone Encounter (Signed)
Called patient back to inquire about her evaluation. She stated that she felt rushed in the session and at the end the therapist mentioned she needed to get her next patient. Mrs Koroma stated her time is just as valuable as everyone elses and that if she was going to be rushed that she is going to go back to the [facility].  ? ?I expressed my apology that she felt rushed. Reviewing her chart the evaluation was performed in 43 mins, and I mentioned our appointments are 45 minutes in length. 45 min is a tough time to get all that needs to be done and the therapist likely rushed to provide the most assistance as possible in the time frame of her appointment. Her time is as as valuable as any other patients, and  I offered getting her set up with a different therapist which Mrs Moffit declined and opted to go ahead and self discharge.   ? ? ?Starr Lake PT, DPT, LAT, ATC  ?04/08/22  2:10 PM ? ? ? ? ?

## 2022-04-09 ENCOUNTER — Ambulatory Visit: Payer: Medicare Other | Admitting: Physical Therapy

## 2022-04-09 ENCOUNTER — Encounter: Payer: Self-pay | Admitting: Family Medicine

## 2022-04-14 ENCOUNTER — Encounter: Payer: Medicare Other | Admitting: Physical Therapy

## 2022-04-17 ENCOUNTER — Encounter: Payer: Medicare Other | Admitting: Physical Therapy

## 2022-04-20 ENCOUNTER — Encounter: Payer: Medicare Other | Admitting: Physical Therapy

## 2022-04-23 ENCOUNTER — Encounter: Payer: Medicare Other | Admitting: Physical Therapy

## 2022-04-28 ENCOUNTER — Encounter: Payer: Medicare Other | Admitting: Physical Therapy

## 2022-05-01 ENCOUNTER — Encounter: Payer: Medicare Other | Admitting: Physical Therapy

## 2022-05-04 ENCOUNTER — Encounter: Payer: Medicare Other | Admitting: Physical Therapy

## 2022-05-08 ENCOUNTER — Encounter: Payer: Medicare Other | Admitting: Physical Therapy

## 2022-05-11 ENCOUNTER — Telehealth: Payer: Self-pay | Admitting: Family Medicine

## 2022-05-11 NOTE — Telephone Encounter (Signed)
Done KH 

## 2022-05-11 NOTE — Telephone Encounter (Signed)
Pt received jury summons for 06/18/22  she wants to know if she can get an excuse letter from you Due to her Macular degeneration and she has to go to bathroom constantly

## 2022-05-13 ENCOUNTER — Other Ambulatory Visit: Payer: Self-pay | Admitting: Family Medicine

## 2022-05-13 ENCOUNTER — Telehealth (INDEPENDENT_AMBULATORY_CARE_PROVIDER_SITE_OTHER): Payer: Medicare Other | Admitting: Family Medicine

## 2022-05-13 ENCOUNTER — Encounter: Payer: Self-pay | Admitting: Family Medicine

## 2022-05-13 VITALS — Ht 62.25 in | Wt 167.0 lb

## 2022-05-13 DIAGNOSIS — N3281 Overactive bladder: Secondary | ICD-10-CM

## 2022-05-13 MED ORDER — DARIFENACIN HYDROBROMIDE ER 15 MG PO TB24: 15.0000 mg | ORAL_TABLET | Freq: Every day | ORAL | 5 refills | Status: DC

## 2022-05-13 NOTE — Telephone Encounter (Signed)
[facility] is requesting a alternative . Please advise [facility]

## 2022-05-13 NOTE — Progress Notes (Signed)
   Subjective:    Patient ID: Kathy [name], female    DOB: 02-03-1951, 71 y.o.   MRN: [id number]  HPI Documentation for virtual audio and video telecommunications through [facility] encounter: The patient was located at home. 2 patient identifiers used.  The provider was located in the office. The patient did consent to this visit and is aware of possible charges through their insurance for this visit. The other persons participating in this telemedicine service were none. Time spent on call was 5 minutes and in review of previous records >15 minutes total for counseling and coordination of care. This virtual service is not related to other E/M service within previous 7 days.  She has been summoned to jury duty and thinks that she cannot do this because she states that she urinates a lot.  Further history indicates that she was treated for this with a medicine called Enablex which did help.  She stopped it on her own stating she did not think she needed it anymore.  She is now having further difficulty with OAB symptoms. Review of Systems     Objective:   Physical Exam Alert and in no distress otherwise not examined       Assessment & Plan:  OAB (overactive bladder) - Plan: darifenacin (ENABLEX) 15 MG 24 hr tablet Discussed the fact that we need to place her back on this medication since that helped with her overactive bladder symptoms and since she did well on this in the past I think she would continue to do well on this and does not need a note to get out of surgery.  She expressed understanding of this.

## 2022-05-26 ENCOUNTER — Encounter (INDEPENDENT_AMBULATORY_CARE_PROVIDER_SITE_OTHER): Payer: Medicare Other | Admitting: Ophthalmology

## 2022-06-09 ENCOUNTER — Encounter (INDEPENDENT_AMBULATORY_CARE_PROVIDER_SITE_OTHER): Payer: Self-pay | Admitting: Ophthalmology

## 2022-06-09 ENCOUNTER — Ambulatory Visit (INDEPENDENT_AMBULATORY_CARE_PROVIDER_SITE_OTHER): Payer: Medicare Other | Admitting: Ophthalmology

## 2022-06-09 DIAGNOSIS — H31103 Choroidal degeneration, unspecified, bilateral: Secondary | ICD-10-CM | POA: Diagnosis not present

## 2022-06-09 DIAGNOSIS — H353134 Nonexudative age-related macular degeneration, bilateral, advanced atrophic with subfoveal involvement: Secondary | ICD-10-CM

## 2022-06-09 DIAGNOSIS — H33101 Unspecified retinoschisis, right eye: Secondary | ICD-10-CM | POA: Diagnosis not present

## 2022-06-09 DIAGNOSIS — Z961 Presence of intraocular lens: Secondary | ICD-10-CM

## 2022-06-09 DIAGNOSIS — H3321 Serous retinal detachment, right eye: Secondary | ICD-10-CM

## 2022-06-09 DIAGNOSIS — H509 Unspecified strabismus: Secondary | ICD-10-CM

## 2022-06-09 DIAGNOSIS — H3122 Choroidal dystrophy (central areolar) (generalized) (peripapillary): Secondary | ICD-10-CM | POA: Diagnosis not present

## 2022-06-09 NOTE — Progress Notes (Signed)
[facility] Note  06/09/2022     CHIEF COMPLAINT Patient presents for Retina Follow Up    HISTORY OF PRESENT ILLNESS: Kathy [name] is a 71 y.o. female who presents to the clinic today for:  HPI     Retina Follow Up   Patient presents with  Other.  In right eye.  Duration of 9 months.  Since onset it is stable.  I, the attending physician,  performed the HPI with the patient and updated documentation appropriately.        Comments   9 month follow up Retinoschisis OD- At times vision is better than others.  At times "I can't see as well"  Using Restasis BID OU, Preservision vitamin      Last edited by Bernarda Caffey, MD on 06/09/2022 10:21 AM.    Pt saw Dr. Gershon Crane in October, she was given Restasis at that time, pt states she is seeing things that are not there, she says sometimes she looks out the window and thinks people are there, but when she goes to look, they aren't there   Referring physician: Denita Lung, MD [place],  [place] [address]  HISTORICAL INFORMATION:   Selected notes from the [facility] Referred by Dr. Gevena Cotton for concern of central areolar choroidal dystrophy LEE: 05.16.19 (MFrederico Hamman) [BCVA: OD: 20/30-2 OS: CF] Ocular Hx-Central areolar choroidal dystrophy, Macular Degeneration, exotropia OS, cataracts OU, dry eyes PMH-HTN, Fibromyalgia    CURRENT MEDICATIONS: Current Outpatient Medications (Ophthalmic Drugs)  Medication Sig   cycloSPORINE (RESTASIS) 0.05 % ophthalmic emulsion Place 1 drop into both eyes 2 (two) times daily.   No current facility-administered medications for this visit. (Ophthalmic Drugs)   Current Outpatient Medications (Other)  Medication Sig   acetaminophen (TYLENOL) 500 MG tablet Take 2 tablets (1,000 mg total) by mouth every 8 (eight) hours as needed for moderate pain.   amLODipine (NORVASC) 5 MG tablet Take 1 tablet (5 mg total) by mouth daily.    atorvastatin (LIPITOR) 20 MG tablet Take 1 tablet (20 mg total) by mouth daily.   darifenacin (ENABLEX) 15 MG 24 hr tablet Take 1 tablet (15 mg total) by mouth daily.   docusate sodium (COLACE) 100 MG capsule Take 100 mg by mouth 2 (two) times daily as needed for mild constipation.   hyoscyamine (LEVBID) 0.375 MG 12 hr tablet Take 1 tablet (0.375 mg total) by mouth 2 (two) times daily.   ketoconazole (NIZORAL) 2 % cream Apply topically 2 (two) times daily.   Multiple Vitamins-Minerals (PRESERVISION AREDS 2) CAPS Take 1 capsule by mouth 2 (two) times a day.   ondansetron (ZOFRAN) 4 MG tablet Take 1 tablet (4 mg total) by mouth every 8 (eight) hours as needed for nausea or vomiting.   sulfamethoxazole-trimethoprim (BACTRIM DS) 800-160 MG tablet Take 1 tablet by mouth 2 (two) times daily. (Patient not taking: Reported on 05/13/2022)   triamcinolone cream (KENALOG) 0.1 % Apply topically 2 (two) times daily. (Patient not taking: Reported on 05/13/2022)   No current facility-administered medications for this visit. (Other)   REVIEW OF SYSTEMS: ROS   Positive for: Gastrointestinal, Musculoskeletal, Cardiovascular, Eyes Negative for: Constitutional, Neurological, Skin, Genitourinary, HENT, Endocrine, Respiratory, Psychiatric, Allergic/Imm, Heme/Lymph Last edited by Leonie Douglas, COA on 06/09/2022  9:29 AM.     ALLERGIES Allergies  Allergen Reactions   Lortab [Hydrocodone-Acetaminophen] Nausea And Vomiting   Latex Other (See Comments)    Pt unsure of reaction!  PAST MEDICAL HISTORY Past Medical History:  Diagnosis Date   Chronic diastolic CHF (congestive heart failure) ([place]) 01/13/2021   Exotropia of left eye    Fibromyalgia    History of uterine leiomyoma    Hypertension    Macular degeneration    OA (osteoarthritis)    Overactive bladder    Sarcoidosis    Wears glasses    Past Surgical History:  Procedure Laterality Date   ABDOMINAL HYSTERECTOMY  2013   ADJUSTABLE SUTURE  MANIPULATION Left 04/10/2015   Procedure: ADJUSTABLE SUTURE MANIPULATION;  Surgeon: Gevena Cotton, MD;  Location[facility];  Service: Ophthalmology;  Laterality: Left;   CATARACT EXTRACTION Bilateral 05/2019   Dr. Gershon Crane   EYE SURGERY Bilateral 05/2019   Cat Sx - Dr. Gershon Crane   MEDIAN RECTUS REPAIR Left 04/10/2015   Procedure: MEDIAN RECTUS RESECTION LATERAL RECTUS RECESSION,AJUSTABLES SUTURES LEFT EYE;  Surgeon: Gevena Cotton, MD;  Location: [facility];  Service: Ophthalmology;  Laterality: Left;   TONSILLECTOMY  as child   TOTAL ABDOMINAL HYSTERECTOMY W/ BILATERAL SALPINGOOPHORECTOMY  10-14-2010   and TVT midurethral sling and culdoplasty   TOTAL KNEE ARTHROPLASTY Left 06/15/2019   Procedure: TOTAL KNEE ARTHROPLASTY;  Surgeon: Paralee Cancel, MD;  Location: [facility];  Service: Orthopedics;  Laterality: Left;  70 mins   FAMILY HISTORY Family History  Problem Relation Age of Onset   Colon cancer Mother    Diabetes Father    Diabetes Sister    Hypertension Sister    Diabetes Sister    Hypertension Sister    Hypertension Sister    SOCIAL HISTORY Social History   Tobacco Use   Smoking status: Never   Smokeless tobacco: Never  Vaping Use   Vaping Use: Never used  Substance Use Topics   Alcohol use: Yes    Alcohol/week: 1.0 standard drink of alcohol    Types: 1 Glasses of wine per week    Comment: rare   Drug use: No       OPHTHALMIC EXAM:  Base Eye Exam     Visual Acuity (Snellen - Linear)       Right Left   Dist cc 20/50 CF 4'   Dist ph cc 20/40-     Correction: Glasses         Tonometry (Tonopen, 9:37 AM)       Right Left   Pressure 16 15         Pupils       Dark Light Shape React APD   Right 3 2 Round Minimal None   Left 3 2 Round Minimal None         Visual Fields (Counting fingers)       Left Right     Full   Restrictions Partial outer superior temporal, inferior temporal, superior nasal, inferior nasal  deficiencies          Extraocular Movement       Right Left    RXT     -- -- --  --  --  -- -- --   -- -- --  --  --  -- -- --           Neuro/Psych     Oriented x3: Yes   Mood/Affect: Normal         Dilation     Both eyes: 1.0% Mydriacyl, 2.5% Phenylephrine @ 9:37 AM           Slit Lamp and Fundus Exam  Slit Lamp Exam       Right Left   Lids/Lashes Dermatochalasis - upper lid Dermatochalasis - upper lid   Conjunctiva/Sclera mild Melanosis, Trace Injection mild Melanosis, trace Injection   Cornea Arcus, Well healed temporal cataract wounds, trace PEE Arcus, linear scar at 0430, Well healed temporal cataract wounds, trace Punctate epithelial erosions   Anterior Chamber deep, clear, narrow angles deep and clear   Iris Round and dilated, focal atrophy temporal iris Round and dilated, mild patches of atrophy   Lens PC IOL in good position, mild pigment on optic PC IOL in good position, mild pigment on optic, 1-2+ Posterior capsular opacification   Anterior Vitreous Vitreous syneresis, Posterior vitreous detachment, vitreous condensations Vitreous syneresis         Fundus Exam       Right Left   Disc Sharp rim, mild pallor, mild PPP Tilted disc, mild pallor, sharp rim, 360 PPA, +cupping   C/D Ratio 0.4 0.8   Macula large area of RPE and choroidal atrophy with small central foveal island spared; central island w/ pigment clumping, no heme large area of choroidal and RPE atrophy extending to nasal side of disc -- stable from prior, Pigment clumping, No heme   Vessels attenuated, Tortuous, copper wiring attenuated, Tortuous   Periphery Attached, 0300 retinal break and +SRF / focal RD within bed of retinoschisis -- stable with good laser surrounding, no new RT/RD or schisis Attached, No heme           IMAGING AND PROCEDURES  Imaging and Procedures for '@TODAY'$ @  OCT, Retina - OU - Both Eyes       Right Eye Quality was good. Central Foveal Thickness:  199. Progression has been stable. Findings include no IRF, no SRF, abnormal foveal contour, retinal drusen , inner retinal atrophy, outer retinal atrophy (diffuse macular atrophy with central island - shrinking, stable nasal schisis caught on widefield -- good laser surrounding).   Left Eye Quality was good. Central Foveal Thickness: 178. Progression has been stable. Findings include no IRF, no SRF, abnormal foveal contour, inner retinal atrophy, outer retinal atrophy (Diffuse ORA / central atrophy; no identifiable ellipsoid).   Notes *Images captured and stored on drive  Diagnosis / Impression:  Diffuse macular atrophy OU -- Stable from prior OD with central island of non-atrophied retina -- shrinking? Central areolar choroidal dystrophy OU Retinoschisis nasal periphery OD -- caught on widefield  Clinical management:  See below  Abbreviations: NFP - Normal foveal profile. CME - cystoid macular edema. PED - pigment epithelial detachment. IRF - intraretinal fluid. SRF - subretinal fluid. EZ - ellipsoid zone. ERM - epiretinal membrane. ORA - outer retinal atrophy. ORT - outer retinal tubulation. SRHM - subretinal hyper-reflective material            ASSESSMENT/PLAN:    ICD-10-CM   1. Right retinoschisis  H33.101     2. Retinal detachment, right  H33.21     3. Central areolar choroidal dystrophy  H31.22     4. Choroidal atrophy of both eyes  H31.103     5. Advanced atrophic nonexudative age-related macular degeneration of both eyes with subfoveal involvement  H35.3134 OCT, Retina - OU - Both Eyes    6. Pseudophakia of both eyes  Z96.1     7. Strabismus  H50.9      1,2. Localized, focal retinal detachment / SRF surrounding small retinal tear at 0330, within retinoschisis cavity, RIGHT EYE  - asymptomatic, but found on exam 5.23.19  -  tear located at 0330 OD  - S/P laser retinopexy OD (05.23.19), S/P touch up laser retinopexy OD (06.28.19) -- good laser in place  - BCVA:  20/40   - no change in SRF/schisis-detachment   - f/u in 1 year -- repeat widefield OCT over 0300 schisis cavity  3-5. Severe outer retinal and choroidal atrophy OU - stable today  - likely central areolar choroidal dystrophy -- pt notes declining vision that started in her 71-50s  - pt reports intermittent photopsias -- suspect may be related to progressive degeneration / dystrophy -- stably improved today  - differential also includes age related macular degeneration, non-exudative, both eyes  - The incidence, anatomy, and pathology of dry AMD, risk of progression, and the AREDS and AREDS 2 study including smoking risks discussed with patient.   - continue amsler grid monitoring   - f/u 1 year  6. Pseudophakia OU  - s/p CE/IOL OU Gershon Crane, June 2020)  - beautiful surgeries, doing well  - monitor   7. Strabismus  - under the expert management of Dr. Frederico Hamman  - s/p EOM surgery 04/10/15  - monitor   Ophthalmic Meds Ordered this visit:  No orders of the defined types were placed in this encounter.      Return in about 1 year (around 06/10/2023) for f/u retinoschisis OD, DFE, OCT.  There are no Patient Instructions on file for this visit.  Explained the diagnoses, plan, and follow up with the patient and they expressed understanding.  Patient expressed understanding of the importance of proper follow up care.   This document serves as a record of services personally performed by Gardiner Sleeper, MD, PhD. It was created on their behalf by Leonie Douglas, an ophthalmic technician. The creation of this record is the provider's dictation and/or activities during the visit.    Electronically signed by: Leonie Douglas COA, 06/09/22  12:53 PM  This document serves as a record of services personally performed by Gardiner Sleeper, MD, PhD. It was created on their behalf by San Jetty. Owens Shark, OA an ophthalmic technician. The creation of this record is the provider's dictation and/or activities during  the visit.    Electronically signed by: San Jetty. Marguerita Merles 07.11.2023 12:53 PM  Gardiner Sleeper, M.D., Ph.D. Diseases & Surgery of the Retina and Vitreous [facility]  I have reviewed the above documentation for accuracy and completeness, and I agree with the above. Gardiner Sleeper, M.D., Ph.D. 06/09/22 12:54 PM  Abbreviations: M myopia (nearsighted); A astigmatism; H hyperopia (farsighted); P presbyopia; Mrx spectacle prescription;  CTL contact lenses; OD right eye; OS left eye; OU both eyes  XT exotropia; ET esotropia; PEK punctate epithelial keratitis; PEE punctate epithelial erosions; DES dry eye syndrome; MGD meibomian gland dysfunction; ATs artificial tears; PFAT's preservative free artificial tears; [place] nuclear sclerotic cataract; PSC posterior subcapsular cataract; ERM epi-retinal membrane; PVD posterior vitreous detachment; RD retinal detachment; DM diabetes mellitus; DR diabetic retinopathy; NPDR non-proliferative diabetic retinopathy; PDR proliferative diabetic retinopathy; CSME clinically significant macular edema; DME diabetic macular edema; dbh dot blot hemorrhages; CWS cotton wool spot; POAG primary open angle glaucoma; C/D cup-to-disc ratio; HVF humphrey visual field; GVF goldmann visual field; OCT optical coherence tomography; IOP intraocular pressure; BRVO Branch retinal vein occlusion; CRVO central retinal vein occlusion; CRAO central retinal artery occlusion; BRAO branch retinal artery occlusion; RT retinal tear; SB scleral buckle; PPV pars plana vitrectomy; VH Vitreous hemorrhage; PRP panretinal laser photocoagulation; IVK intravitreal kenalog; VMT  vitreomacular traction; MH Macular hole;  NVD neovascularization of the disc; NVE neovascularization elsewhere; AREDS age related eye disease study; ARMD age related macular degeneration; POAG primary open angle glaucoma; EBMD epithelial/anterior basement membrane dystrophy; ACIOL anterior chamber intraocular lens;  IOL intraocular lens; PCIOL posterior chamber intraocular lens; Phaco/IOL phacoemulsification with intraocular lens placement; [place] photorefractive keratectomy; LASIK laser assisted in situ keratomileusis; HTN hypertension; DM diabetes mellitus; COPD chronic obstructive pulmonary disease

## 2022-06-12 ENCOUNTER — Ambulatory Visit: Payer: Medicare Other | Admitting: Podiatry

## 2022-06-12 DIAGNOSIS — B351 Tinea unguium: Secondary | ICD-10-CM | POA: Diagnosis not present

## 2022-06-12 DIAGNOSIS — M79675 Pain in left toe(s): Secondary | ICD-10-CM | POA: Diagnosis not present

## 2022-06-12 DIAGNOSIS — M79674 Pain in right toe(s): Secondary | ICD-10-CM | POA: Diagnosis not present

## 2022-06-14 NOTE — Progress Notes (Signed)
Subjective: 71 y.o. returns the office today for painful, elongated, thickened toenails which she cannot trim herself. Denies any redness or drainage around the nails.  States the plantar fascia symptoms has resolved.  No new injuries or concerns.    PCP: Denita Lung, MD Last Seen: 05/13/2022  Objective: AAO 3, NAD DP/PT pulses palpable, CRT less than 3 seconds Protective sensation intact with Simms Weinstein monofilament Nails hypertrophic, dystrophic, elongated, brittle, discolored 10. There is tenderness overlying the nails 1-5 bilaterally. There is no surrounding erythema or drainage along the nail sites. No open lesions or pre-ulcerative lesions are identified. Code there is no pain on the course or insertion of plantar fascia.  Noted.  Pinpoint tenderness.  Surgical site well-healed without pain. No pain with calf compression, swelling, warmth, erythema.  Assessment: Patient presents with symptomatic onychomycosis  Plan: -Treatment options including alternatives, risks, complications were discussed -Nails sharply debrided 10 without complication/bleeding. -Discussed daily foot inspection. If there are any changes, to call the office immediately.  -Follow-up in 3 months or sooner if any problems are to arise. In the meantime, encouraged to call the office with any questions, concerns, changes symptoms.  Celesta Gentile, DPM

## 2022-08-05 ENCOUNTER — Encounter: Payer: Self-pay | Admitting: Internal Medicine

## 2022-08-19 ENCOUNTER — Telehealth: Payer: Self-pay | Admitting: Licensed Clinical Social Worker

## 2022-08-20 ENCOUNTER — Ambulatory Visit (INDEPENDENT_AMBULATORY_CARE_PROVIDER_SITE_OTHER): Payer: Medicare Other | Admitting: Family Medicine

## 2022-08-20 ENCOUNTER — Encounter: Payer: Self-pay | Admitting: Family Medicine

## 2022-08-20 VITALS — BP 134/80 | HR 103 | Temp 98.4°F | Wt 172.6 lb

## 2022-08-20 DIAGNOSIS — F488 Other specified nonpsychotic mental disorders: Secondary | ICD-10-CM

## 2022-08-20 DIAGNOSIS — Z23 Encounter for immunization: Secondary | ICD-10-CM

## 2022-08-20 NOTE — Progress Notes (Signed)
   Subjective:    Patient ID: Kathy [name], female    DOB: 1951-08-12, 71 y.o.   MRN: [id number]  HPI She states that on September 13 she received a phone call that was quite disconcerting and after that she had a flushed feeling and the next thing she remembers is actually being in the driveway of the house that she was heading to.  Someone else was driving the car and said that she had passed out.  When she got to the house she had difficulty with vomiting and diarrhea and then laying down.  Since that time she has had no blurred or double vision, headache, chest pain, shortness of breath.    Review of Systems     Objective:   Physical Exam Alert and in no distress. Tympanic membranes and canals are normal. Pharyngeal area is normal. Neck is supple without adenopathy or thyromegaly. Cardiac exam shows a regular sinus rhythm without murmurs or gallops. Lungs are clear to auscultation.        Assessment & Plan:  Psychogenic syncope  Need for influenza vaccination - Plan: Flu Vaccine QUAD High Dose(Fluad) I explained that I thought she had a syncopal episode and saw no evidence of any other pathology going on.  She expressed understanding of this Have a follow-up appointment in the near future for complete exam.

## 2022-08-25 ENCOUNTER — Ambulatory Visit (INDEPENDENT_AMBULATORY_CARE_PROVIDER_SITE_OTHER): Payer: Medicare Other | Admitting: Podiatry

## 2022-08-25 DIAGNOSIS — B351 Tinea unguium: Secondary | ICD-10-CM

## 2022-08-25 DIAGNOSIS — M79674 Pain in right toe(s): Secondary | ICD-10-CM

## 2022-08-25 DIAGNOSIS — M79675 Pain in left toe(s): Secondary | ICD-10-CM | POA: Diagnosis not present

## 2022-08-25 NOTE — Progress Notes (Signed)
Subjective: 71 y.o. returns the office today for painful, elongated, thickened toenails which she cannot trim herself. Denies any redness or drainage around the nails.  States the plantar fascia symptoms has resolved.  No new injuries or concerns.    PCP: Denita Lung, MD Last Seen: August 20, 2022  Objective: AAO 3, NAD DP/PT pulses palpable, CRT less than 3 seconds Protective sensation intact with Derrel Nip monofilament Nails hypertrophic, dystrophic, elongated, brittle, discolored 10. There is tenderness overlying the nails 1-5 bilaterally. There is no surrounding erythema or drainage along the nail sites. Callus formation today.  Arthrodesis is stable.  No pain in the previous surgical site she has no concerns. No pain with calf compression, swelling, warmth, erythema.  Assessment: Patient presents with symptomatic onychomycosis  Plan: -Treatment options including alternatives, risks, complications were discussed -Nails sharply debrided 10 without complication/bleeding. -Discussed daily foot inspection. If there are any changes, to call the office immediately.  -Follow-up in 3 months or sooner if any problems are to arise. In the meantime, encouraged to call the office with any questions, concerns, changes symptoms.  Celesta Gentile, DPM

## 2022-08-28 NOTE — Patient Outreach (Signed)
  Care Coordination   Initial Visit Note   08/28/2022 Name: Kathy [name] MRN: [id number] DOB: 1951/04/28  Kathy [name] is a 71 y.o. year old female who sees Denita Lung, MD for primary care. I spoke with  Kathy [name] by phone today.  What matters to the patients health and wellness today?  Care Coordination     Goals Addressed             This Visit's Progress    COMPLETED: Care Coordination Activities-No Follow Up Required       Care Coordination Interventions: Active listening / Reflection utilized  LCSW informed patient of care coordination services. Pt is not interested at this time and agreed to contact PCP, should needs arise Patient reported concerns of validity of Care Coordination program        SDOH assessments and interventions completed:  No     Care Coordination Interventions Activated:  Yes  Care Coordination Interventions:  Yes, provided   Follow up plan: No further intervention required.   Encounter Outcome:  Pt. Refused   Christa See, MSW, [email or web address] Phone [phone] 12:32 AM

## 2022-09-08 ENCOUNTER — Encounter: Payer: Self-pay | Admitting: Internal Medicine

## 2022-09-11 ENCOUNTER — Ambulatory Visit: Payer: Medicare Other | Admitting: Family Medicine

## 2022-09-17 ENCOUNTER — Telehealth: Payer: Self-pay | Admitting: Family Medicine

## 2022-09-17 NOTE — Telephone Encounter (Signed)
Left message for patient to call back and schedule Medicare Annual Wellness Visit (AWV) either virtually or in office. Left  my jabber number [phone]   Last AWV  09/03/21 please schedule with Nurse Health Adviser   45 min for awv-i and in office appointments 30 min for awv-s  phone/virtual appointments  

## 2022-09-21 ENCOUNTER — Encounter: Payer: Self-pay | Admitting: Internal Medicine

## 2022-09-22 DIAGNOSIS — H353124 Nonexudative age-related macular degeneration, left eye, advanced atrophic with subfoveal involvement: Secondary | ICD-10-CM | POA: Diagnosis not present

## 2022-09-22 DIAGNOSIS — H04123 Dry eye syndrome of bilateral lacrimal glands: Secondary | ICD-10-CM | POA: Diagnosis not present

## 2022-09-22 DIAGNOSIS — H524 Presbyopia: Secondary | ICD-10-CM | POA: Diagnosis not present

## 2022-09-22 DIAGNOSIS — Z961 Presence of intraocular lens: Secondary | ICD-10-CM | POA: Diagnosis not present

## 2022-09-22 DIAGNOSIS — H353111 Nonexudative age-related macular degeneration, right eye, early dry stage: Secondary | ICD-10-CM | POA: Diagnosis not present

## 2022-09-23 ENCOUNTER — Other Ambulatory Visit (INDEPENDENT_AMBULATORY_CARE_PROVIDER_SITE_OTHER): Payer: Medicare Other

## 2022-09-23 DIAGNOSIS — Z23 Encounter for immunization: Secondary | ICD-10-CM

## 2022-10-06 ENCOUNTER — Telehealth: Payer: Self-pay | Admitting: Family Medicine

## 2022-10-06 NOTE — Telephone Encounter (Signed)
Left message for patient to call back and schedule Medicare Annual Wellness Visit (AWV) either virtually or in office. Left  my Herbie Drape number [phone]   Last AWV  09/03/21 please schedule with Nurse Health Adviser   45 min for awv-i and in office appointments 30 min for awv-s  phone/virtual appointments

## 2022-10-21 ENCOUNTER — Ambulatory Visit
Admission: RE | Admit: 2022-10-21 | Discharge: 2022-10-21 | Disposition: A | Discharge status: Home / Self Care | Payer: Medicare Other | Source: Ambulatory Visit | Attending: Family Medicine | Admitting: Family Medicine

## 2022-10-21 ENCOUNTER — Other Ambulatory Visit: Payer: Self-pay | Admitting: Family Medicine

## 2022-10-21 ENCOUNTER — Ambulatory Visit (INDEPENDENT_AMBULATORY_CARE_PROVIDER_SITE_OTHER): Payer: Medicare Other | Admitting: Family Medicine

## 2022-10-21 VITALS — BP 120/80 | HR 119 | Wt 170.0 lb

## 2022-10-21 DIAGNOSIS — M25511 Pain in right shoulder: Secondary | ICD-10-CM

## 2022-10-21 DIAGNOSIS — I10 Essential (primary) hypertension: Secondary | ICD-10-CM

## 2022-10-21 DIAGNOSIS — M19011 Primary osteoarthritis, right shoulder: Secondary | ICD-10-CM | POA: Diagnosis not present

## 2022-10-21 NOTE — Patient Instructions (Signed)
You can take 2 Tylenol 4 times per day and you can also take 2 Aleve twice per day also

## 2022-10-21 NOTE — Progress Notes (Signed)
   Subjective:    Patient ID: [name], female    DOB: 19-Oct-1951, 71 y.o.   MRN: [id number]  HPI She complains of a 1 week history of right shoulder pain.  No history of injury or overuse.  No numbness, tingling or weakness.  The pain is mainly with motion in any direction.  No other joints are involved.   Review of Systems     Objective:   Physical Exam Pain on motion of the shoulder in any direction.  No laxity noted.  Drop arm test was uncomfortable.  Neer's and Hawkins test was again uncomfortable.  No tenderness over the [facility] joint or bicipital groove.       Assessment & Plan:  Acute pain of right shoulder - Plan: DG Shoulder Right Recommend 2 Tylenol 4 times per day and also 2 Aleve twice per day if she needs extra benefit and wait for the x-ray report.  If it is essentially negative, she will return for an injection if no benefit from the Tylenol/Aleve.  She was comfortable with that.

## 2022-11-03 ENCOUNTER — Encounter: Payer: Self-pay | Admitting: Family Medicine

## 2022-11-03 ENCOUNTER — Ambulatory Visit (INDEPENDENT_AMBULATORY_CARE_PROVIDER_SITE_OTHER): Payer: Medicare Other | Admitting: Family Medicine

## 2022-11-03 VITALS — BP 140/78 | HR 120 | Wt 175.0 lb

## 2022-11-03 DIAGNOSIS — M25511 Pain in right shoulder: Secondary | ICD-10-CM | POA: Diagnosis not present

## 2022-11-03 NOTE — Progress Notes (Signed)
   Subjective:    Patient ID: [name], female    DOB: September 13, 1951, 71 y.o.   MRN: [id number]  HPI She is here for a recheck.  She states that the Tylenol/Aleve help get her some relief of her shoulder pain but not enough.  X-rays do show some mild glenohumeral changes.   Review of Systems     Objective:   Physical Exam Alert and in no distress.  No joint laxity is noted.  Slight discomfort with internal as well as external rotation.  Neer's and Hawkins test was uncomfortable.       Assessment & Plan:  Acute pain of right shoulder - Plan: Ambulatory referral to Orthopedic Surgery Since she has not responded to conservative care and the x-ray shows degenerative changes I think referral for possible joint injection is appropriate.

## 2022-11-07 IMAGING — CT CT ABD-PELV W/ CM
2 of 5 series · 16 of 46 positions shown, 18 images · IV contrast (APPLIED)
Comparison: Renal ultrasound dated 11/21/2020.

CLINICAL DATA: 69-year-old female with abdominal pain.

EXAM:
CT ABDOMEN AND PELVIS WITH CONTRAST
TECHNIQUE: Multidetector CT imaging of the abdomen and pelvis was performed
using the standard protocol following bolus administration of
intravenous contrast.
CONTRAST:  100mL OMNIPAQUE IOHEXOL 300 MG/ML  SOLN

[Series 2: axial st · axial · 0.88mm/px · z∈[+507,+867]mm · 13 of 85 slices shown, 15 images]
[im 7/85  soft-tissue]
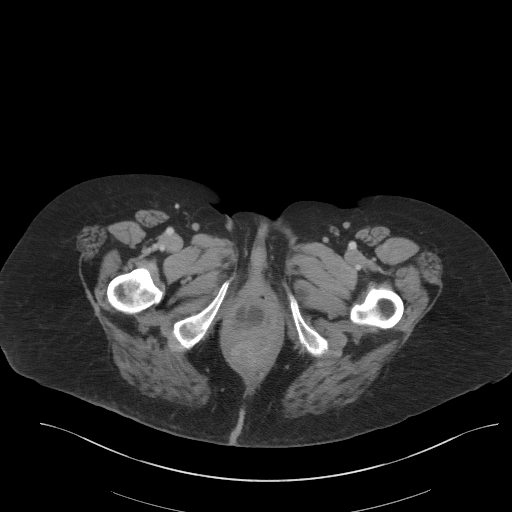
[im 7/85  bone]
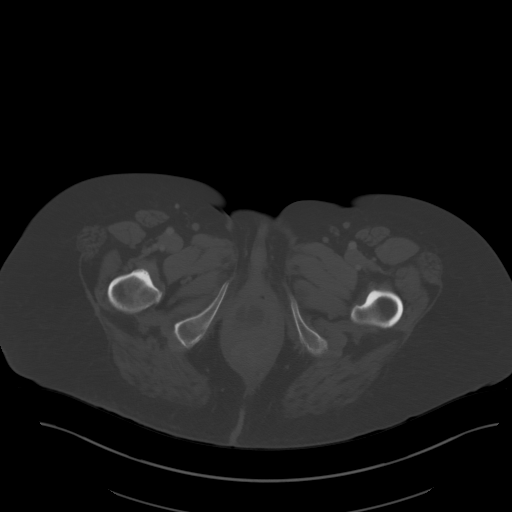
[im 13/85  soft-tissue]
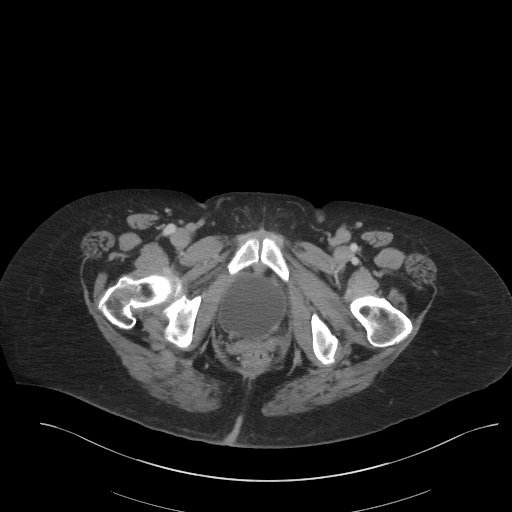
[im 19/85  soft-tissue]
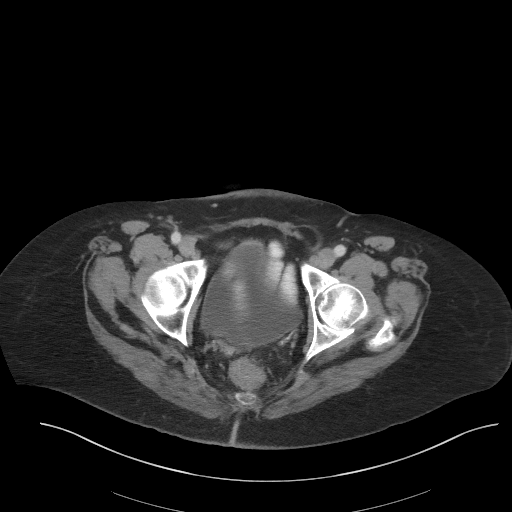
[im 25/85  soft-tissue]
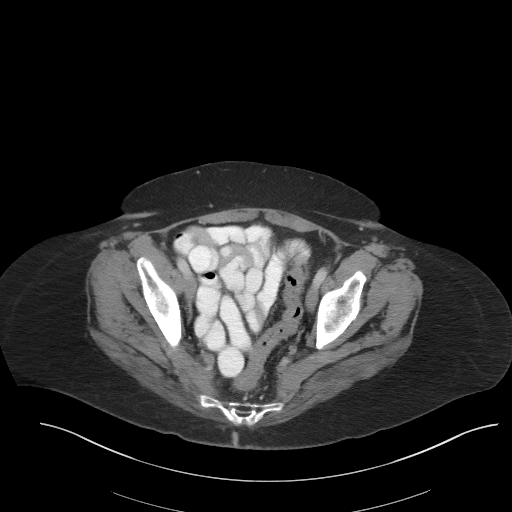
[im 31/85  soft-tissue]
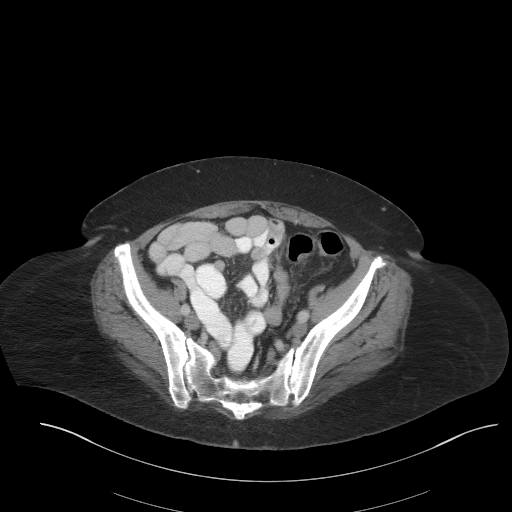
[im 37/85  soft-tissue]
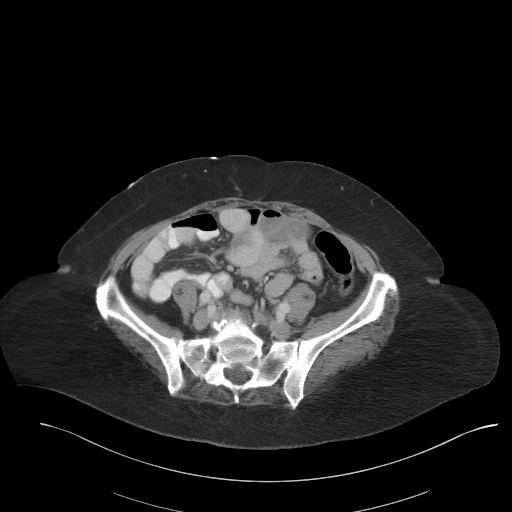
[im 43/85  soft-tissue]
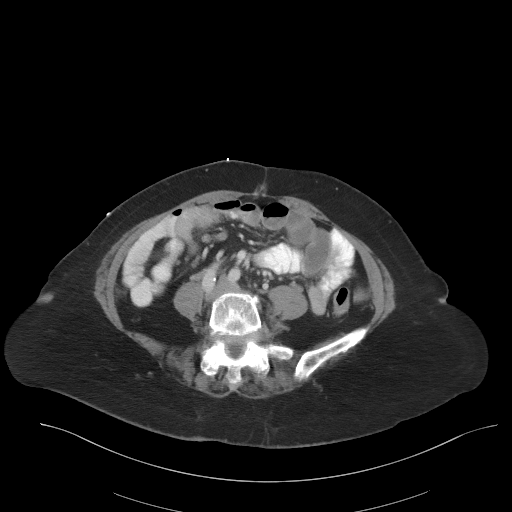
[im 49/85  soft-tissue]
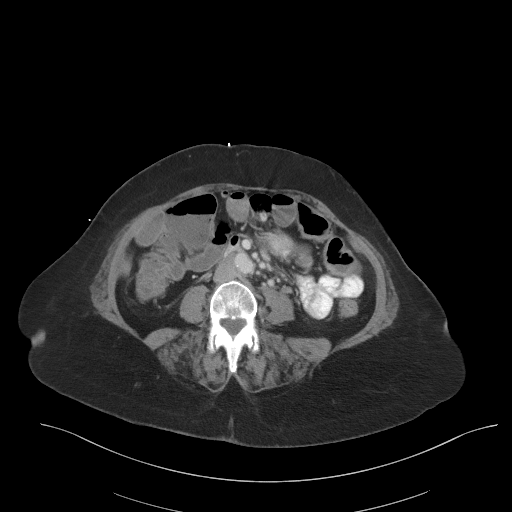
[im 55/85  soft-tissue]
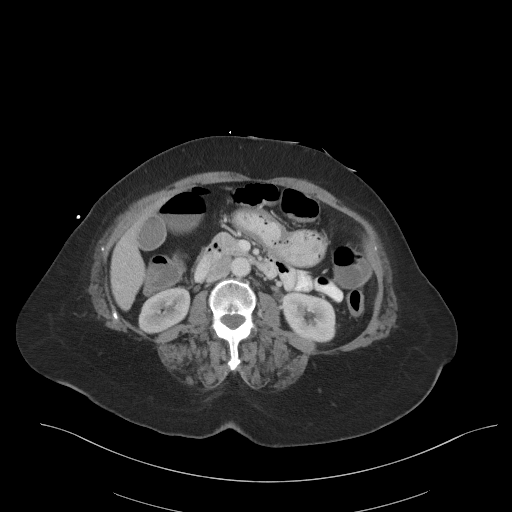
[im 55/85  bone]
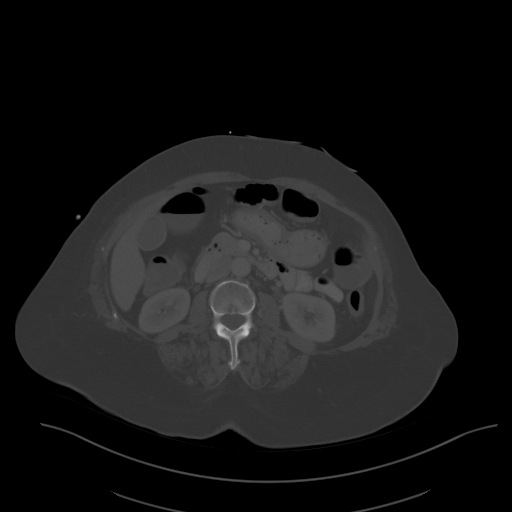
[im 61/85  soft-tissue]
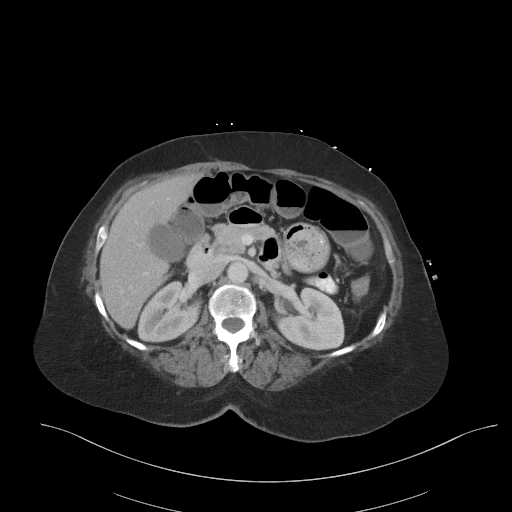
[im 67/85  soft-tissue]
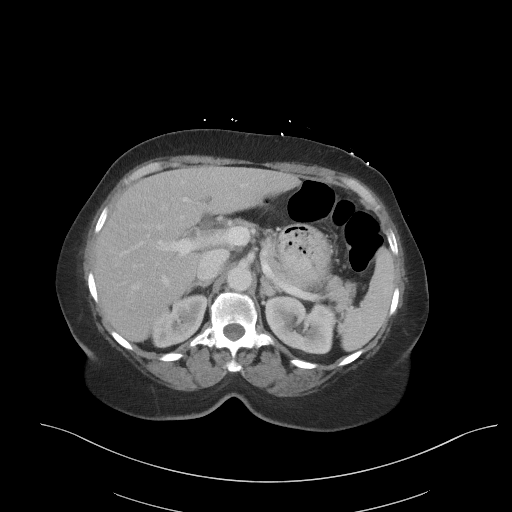
[im 73/85  soft-tissue]
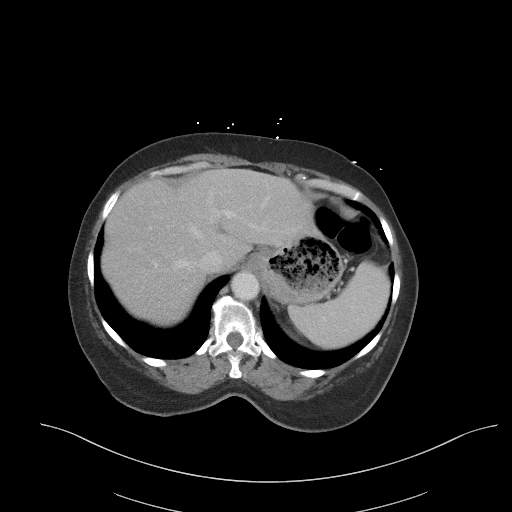
[im 79/85  soft-tissue]
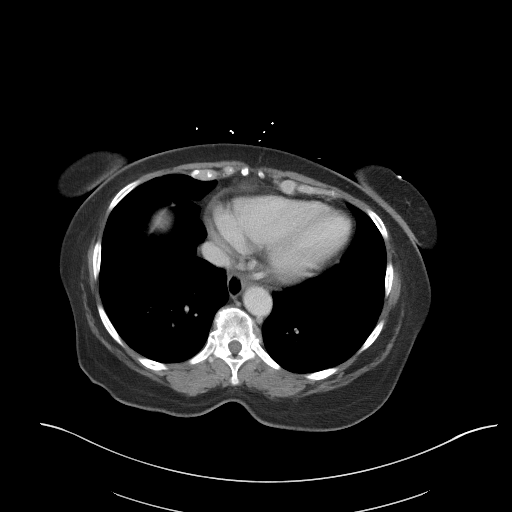

[Series 5: coronal st · coronal · 0.68mm/px · 3 of 94 slices shown]
[im 32/94  soft-tissue]
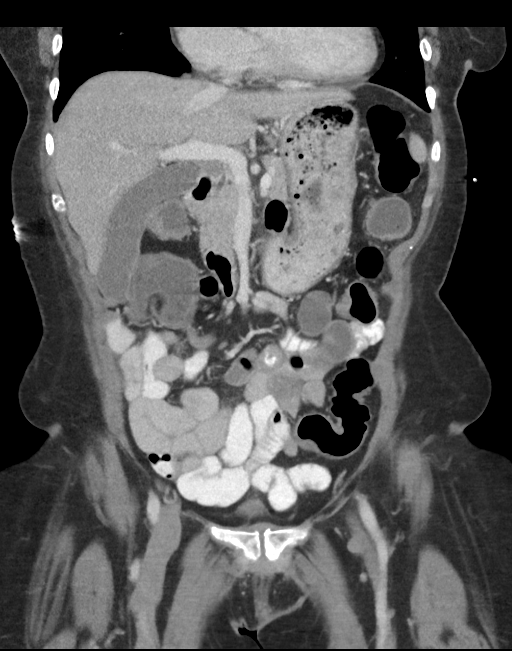
[im 42/94  soft-tissue]
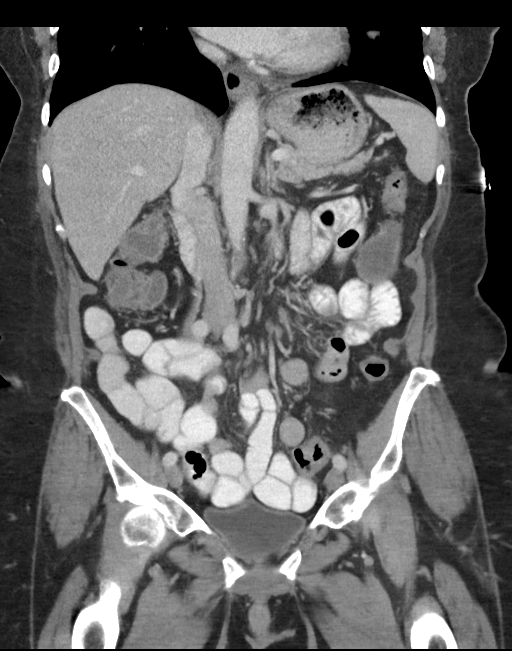
[im 52/94  soft-tissue]
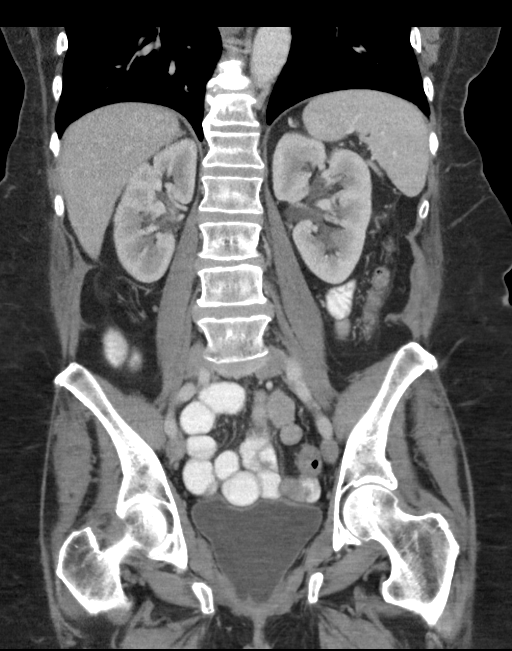

[16 of 46 positions shown; findings below may reference images not displayed]

FINDINGS: Lower chest: The visualized lung bases are clear.

No intra-abdominal free air or free fluid.

Hepatobiliary: Mild fatty liver. No intrahepatic biliary dilatation.
The gallbladder is unremarkable.

Pancreas: Unremarkable. No pancreatic ductal dilatation or
surrounding inflammatory changes.

Spleen: Normal in size without focal abnormality.

Adrenals/Urinary Tract: The adrenal glands unremarkable. There is no
hydronephrosis on either side. There is symmetric enhancement and
excretion of contrast by both kidneys. The visualized ureters and
urinary bladder appear unremarkable.

Stomach/Bowel: There is loose stool throughout the colon compatible
with diarrheal state. Correlation with clinical exam and stool
cultures recommended. There is mild thickened appearance of the:
Which may represent mild colitis. There is no bowel obstruction.
Normal appendix.

Vascular/Lymphatic: The abdominal aorta and IVC unremarkable. No
portal venous gas. There is no adenopathy.

Reproductive: Hysterectomy.

Other: None

Musculoskeletal: Degenerative changes of the spine. No acute osseous
pathology.
IMPRESSION: 1. Diarrheal state with possible mild colitis. Correlation with
clinical exam and stool cultures recommended. No bowel obstruction.
Normal appendix.
2. Mild fatty liver.

## 2022-11-12 ENCOUNTER — Ambulatory Visit (INDEPENDENT_AMBULATORY_CARE_PROVIDER_SITE_OTHER): Payer: Medicare Other | Admitting: Sports Medicine

## 2022-11-12 ENCOUNTER — Ambulatory Visit: Payer: Self-pay

## 2022-11-12 ENCOUNTER — Encounter: Payer: Self-pay | Admitting: Sports Medicine

## 2022-11-12 DIAGNOSIS — M25511 Pain in right shoulder: Secondary | ICD-10-CM | POA: Diagnosis not present

## 2022-11-12 DIAGNOSIS — M19011 Primary osteoarthritis, right shoulder: Secondary | ICD-10-CM | POA: Diagnosis not present

## 2022-11-12 MED ORDER — BUPIVACAINE HCL 0.25 % IJ SOLN
2.0000 mL | INTRAMUSCULAR | Status: AC | PRN
  Administered 2022-11-12: 2 mL via INTRA_ARTICULAR

## 2022-11-12 MED ORDER — METHYLPREDNISOLONE ACETATE 40 MG/ML IJ SUSP
80.0000 mg | INTRAMUSCULAR | Status: AC | PRN
  Administered 2022-11-12: 80 mg via INTRA_ARTICULAR

## 2022-11-12 MED ORDER — LIDOCAINE HCL 1 % IJ SOLN
2.0000 mL | INTRAMUSCULAR | Status: AC | PRN
  Administered 2022-11-12: 2 mL

## 2022-11-12 NOTE — Progress Notes (Signed)
Right shoulder pain for 3 weeks. 8/10 pain. Any activity makes it hurt.

## 2022-11-12 NOTE — Progress Notes (Signed)
Kathy [name] - 71 y.o. female MRN [id number]  Date of birth: 12-17-1950  Office Visit Note: Visit Date: 11/12/2022 PCP: Kathy Lung, MD Referred by: Kathy Lung, MD  Subjective: Chief Complaint  Patient presents with   Right Shoulder - Pain   HPI: Kathy [name] is a pleasant 71 y.o. female who presents today for acute pain of right shoulder.  Pain x 3-4 weeks. No injury.  Has had some pain and stiffness in the shoulder before but this is more severe. She has been alternating Tylenol and Aleve which does help to some extent, although her pain still remains. Pain is affecting her activities of daily living, painful to lie on that shoulder. Denies any neck pain, no numbness or tingling extending down the arm into the hands or fingers.  Pertinent ROS were reviewed with the patient and found to be negative unless otherwise specified above in HPI.   Assessment & Plan: Visit Diagnoses:  1. Acute pain of right shoulder   2. Primary osteoarthritis, right shoulder    Plan: We did review her shoulder x-ray today which does show moderate glenohumeral joint arthritis, also some bony sclerosis over the greater tuberosity with evidence of likely impingement.  We discussed all treatment options such as anti-inflammatories, physical therapy, home exercises, and injection therapy.  Given that she has failed pharmacologic therapy and conservative measures, elected to proceed with ultrasound-guided glenohumeral joint injection.  She tolerated this well and had good relief following the anesthetic portion of this procedure.  She may use Tylenol, Aleve and or ice for any postinjection pain.  If for some reason this does not improve her pain, would consider subacromial joint injection next. She may follow-up with me on an as-needed basis. Otherwise, she is in excellent hands with her PCP, Dr. Redmond School.  Follow-up: Return if symptoms worsen or fail to improve.   Meds & Orders: No orders of  the defined types were placed in this encounter.   Orders Placed This Encounter  Procedures   Large Joint Inj   US Guided Needle Placement - No Linked Charges     Procedures: Large Joint Inj: R glenohumeral on 11/12/2022 11:03 AM Indications: pain Details: 22 G 3.5 in needle, ultrasound-guided posterior approach Medications: 2 mL lidocaine 1 %; 2 mL bupivacaine 0.25 %; 80 mg methylPREDNISolone acetate 40 MG/ML Outcome: tolerated well, no immediate complications  US-guided glenohumeral joint injection, right shoulder After discussion on risks/benefits/indications, informed verbal consent was obtained. A timeout was then performed. The patient was positioned lying lateral recumbent on examination table. The patient's shoulder was prepped with betadine and multiple alcohol swabs and utilizing ultrasound guidance, the patient's glenohumeral joint was identified on ultrasound. Using ultrasound guidance a 22-gauge, 3.5 inch needle with a mixture of 2:2:2 cc's lidocaine:bupivicaine:depomedrol was directed from a lateral to medial direction via in-plane technique into the glenohumeral joint with visualization of appropriate spread of injectate into the joint. Patient tolerated the procedure well without immediate complications.      Procedure, treatment alternatives, risks and benefits explained, specific risks discussed. Consent was given by the patient. Immediately prior to procedure a time out was called to verify the correct patient, procedure, equipment, support staff and site/side marked as required. Patient was prepped and draped in the usual sterile fashion.          Clinical History: No specialty comments available.  She reports that she has never smoked. She has never used smokeless tobacco. No results for input(s): "HGBA1C", "  LABURIC" in the last 8760 hours.  Objective:    Physical Exam  Gen: Well-appearing, in no acute distress; non-toxic CV: Well-perfused. Warm.  Resp:  Breathing unlabored on room air; no wheezing. Psych: Fluid speech in conversation; appropriate affect; normal thought process Neuro: Sensation intact throughout. No gross coordination deficits.   Ortho Exam - Right shoulder: There is mild TTP palpating over the anterior glenohumeral joint, no specific bony TTP.  No overlying skin changes or effusion.  There is full active flexion abduction and internal rotation, mild restriction both active and passively to external rotation.  There is pain with both internal and external rotation.  Positive Hawkins test.  Neurovascular intact.  Imaging:  *3 views of the right shoulder including Grashey, scapular Y and axial view from 10/19/2022 were independently reviewed and interpreted by myself.  X-rays demonstrate moderate glenohumeral joint arthritis.  The humeral head is well located without evidence of superior migration.  There is some bony sclerosis at the greater tuberosity, possibly indicative of impingement type syndrome.    DG Shoulder Right CLINICAL DATA:  Pain  EXAM: RIGHT SHOULDER - 2+ VIEW  COMPARISON:  None Available.  FINDINGS: Mild glenohumeral degenerative changes. No fracture or dislocation. No other abnormalities.  IMPRESSION: Mild glenohumeral degenerative changes.  Electronically Signed   By: Kathy [name] M.D.   On: 10/23/2022 12:34    Past Medical/Family/Surgical/Social History: Medications & Allergies reviewed per EMR, new medications updated. Patient Active Problem List   Diagnosis Date Noted   Status post cataract extraction 09/03/2021   Aortic atherosclerosis ([place]) 02/03/2021   Chronic diastolic CHF (congestive heart failure) ([place]) 01/13/2021   Hallux rigidus of right foot 01/13/2021   Obese 06/16/2019   S/P left TKA 06/15/2019   Arthritis 10/28/2016   Allergic rhinitis due to pollen 02/03/2016   Exotropia 04/08/2015   Macular degeneration 04/27/2012   Fibromyalgia 02/22/2012   Pulmonary  sarcoidosis ([place]) 07/09/2010   Essential hypertension 07/09/2010   Past Medical History:  Diagnosis Date   Chronic diastolic CHF (congestive heart failure) ([place]) 01/13/2021   Exotropia of left eye    Fibromyalgia    History of uterine leiomyoma    Hypertension    Macular degeneration    OA (osteoarthritis)    Overactive bladder    Sarcoidosis    Wears glasses    Family History  Problem Relation Age of Onset   Colon cancer Mother    Diabetes Father    Diabetes Sister    Hypertension Sister    Diabetes Sister    Hypertension Sister    Hypertension Sister    Past Surgical History:  Procedure Laterality Date   ABDOMINAL HYSTERECTOMY  2013   ADJUSTABLE SUTURE MANIPULATION Left 04/10/2015   Procedure: ADJUSTABLE SUTURE MANIPULATION;  Surgeon: Gevena Cotton, MD;  Location: [facility];  Service: Ophthalmology;  Laterality: Left;   CATARACT EXTRACTION Bilateral 05/2019   Dr. Gershon Crane   EYE SURGERY Bilateral 05/2019   Cat Sx - Dr. Gershon Crane   MEDIAN RECTUS REPAIR Left 04/10/2015   Procedure: MEDIAN RECTUS RESECTION LATERAL RECTUS RECESSION,AJUSTABLES SUTURES LEFT EYE;  Surgeon: Gevena Cotton, MD;  Location: [facility];  Service: Ophthalmology;  Laterality: Left;   TONSILLECTOMY  as child   TOTAL ABDOMINAL HYSTERECTOMY W/ BILATERAL SALPINGOOPHORECTOMY  10-14-2010   and TVT midurethral sling and culdoplasty   TOTAL KNEE ARTHROPLASTY Left 06/15/2019   Procedure: TOTAL KNEE ARTHROPLASTY;  Surgeon: Paralee Cancel, MD;  Location: [facility];  Service: Orthopedics;  Laterality:  Left;  70 mins   Social History   Occupational History   Not on file  Tobacco Use   Smoking status: Never   Smokeless tobacco: Never  Vaping Use   Vaping Use: Never used  Substance and Sexual Activity   Alcohol use: Yes    Alcohol/week: 1.0 standard drink of alcohol    Types: 1 Glasses of wine per week    Comment: rare   Drug use: No   Sexual activity: Yes

## 2022-11-19 ENCOUNTER — Ambulatory Visit: Payer: Medicare Other | Admitting: Podiatry

## 2022-11-19 DIAGNOSIS — M79675 Pain in left toe(s): Secondary | ICD-10-CM | POA: Diagnosis not present

## 2022-11-19 DIAGNOSIS — B351 Tinea unguium: Secondary | ICD-10-CM

## 2022-11-19 DIAGNOSIS — M79674 Pain in right toe(s): Secondary | ICD-10-CM | POA: Diagnosis not present

## 2022-11-20 ENCOUNTER — Ambulatory Visit: Payer: Medicare Other | Admitting: Podiatry

## 2022-11-21 NOTE — Progress Notes (Signed)
Subjective: 71 y.o. returns the office today for painful, elongated, thickened toenails which she cannot trim herself. Denies any redness or drainage around the nails.  She has no other concerns today.  No recent injuries or changes otherwise.  PCP: Denita Lung, MD Last Seen: 8/27 2023  Objective: AAO 3, NAD DP/PT pulses palpable, CRT less than 3 seconds Protective sensation intact with Derrel Nip monofilament Nails hypertrophic, dystrophic, elongated, brittle, discolored 10. There is tenderness overlying the nails 1-5 bilaterally. There is no surrounding erythema or drainage along the nail sites. Arthrodesis site is stable.  No pain along the surgical site. No pain with calf compression, swelling, warmth, erythema.  Assessment: Patient presents with symptomatic onychomycosis  Plan: -Treatment options including alternatives, risks, complications were discussed -Nails sharply debrided 10 without complication/bleeding. -Discussed daily foot inspection. If there are any changes, to call the office immediately.  -Follow-up in 3 months or sooner if any problems are to arise. In the meantime, encouraged to call the office with any questions, concerns, changes symptoms.  Celesta Gentile, DPM

## 2022-11-25 ENCOUNTER — Encounter: Payer: Self-pay | Admitting: Family Medicine

## 2022-11-25 ENCOUNTER — Ambulatory Visit (INDEPENDENT_AMBULATORY_CARE_PROVIDER_SITE_OTHER): Payer: Medicare Other | Admitting: Family Medicine

## 2022-11-25 VITALS — BP 158/92 | HR 110 | Temp 98.1°F | Ht 62.5 in | Wt 172.4 lb

## 2022-11-25 DIAGNOSIS — Z800 Family history of malignant neoplasm of digestive organs: Secondary | ICD-10-CM | POA: Diagnosis not present

## 2022-11-25 DIAGNOSIS — I10 Essential (primary) hypertension: Secondary | ICD-10-CM | POA: Diagnosis not present

## 2022-11-25 DIAGNOSIS — H5010 Unspecified exotropia: Secondary | ICD-10-CM | POA: Diagnosis not present

## 2022-11-25 DIAGNOSIS — M1990 Unspecified osteoarthritis, unspecified site: Secondary | ICD-10-CM

## 2022-11-25 DIAGNOSIS — I700 Atherosclerosis of aorta: Secondary | ICD-10-CM | POA: Diagnosis not present

## 2022-11-25 DIAGNOSIS — N3281 Overactive bladder: Secondary | ICD-10-CM

## 2022-11-25 DIAGNOSIS — H3530 Unspecified macular degeneration: Secondary | ICD-10-CM

## 2022-11-25 DIAGNOSIS — Z0000 Encounter for general adult medical examination without abnormal findings: Secondary | ICD-10-CM

## 2022-11-25 DIAGNOSIS — E6609 Other obesity due to excess calories: Secondary | ICD-10-CM

## 2022-11-25 DIAGNOSIS — D860 Sarcoidosis of lung: Secondary | ICD-10-CM | POA: Diagnosis not present

## 2022-11-25 DIAGNOSIS — Z96652 Presence of left artificial knee joint: Secondary | ICD-10-CM | POA: Diagnosis not present

## 2022-11-25 DIAGNOSIS — I5032 Chronic diastolic (congestive) heart failure: Secondary | ICD-10-CM

## 2022-11-25 DIAGNOSIS — Z9849 Cataract extraction status, unspecified eye: Secondary | ICD-10-CM | POA: Diagnosis not present

## 2022-11-25 MED ORDER — DARIFENACIN HYDROBROMIDE ER 15 MG PO TB24: 15.0000 mg | ORAL_TABLET | Freq: Every day | ORAL | 3 refills | Status: DC

## 2022-11-25 MED ORDER — AMLODIPINE BESYLATE 5 MG PO TABS: 5.0000 mg | ORAL_TABLET | Freq: Every day | ORAL | 3 refills | Status: DC

## 2022-11-25 MED ORDER — ATORVASTATIN CALCIUM 20 MG PO TABS: 20.0000 mg | ORAL_TABLET | Freq: Every day | ORAL | 3 refills | Status: DC

## 2022-11-25 NOTE — Patient Instructions (Signed)
  Kathy [name] , Thank you for taking time to come for your Medicare Wellness Visit. I appreciate your ongoing commitment to your health goals. Please review the following plan we discussed and let me know if I can assist you in the future.   These are the goals we discussed:  Goals   None     This is a list of the screening recommended for you and due dates:  Health Maintenance  Topic Date Due   Zoster (Shingles) Vaccine (2 of 2) 11/11/2017   Pneumonia Vaccine (3 - PPSV23 or PCV20) 02/02/2021   Colon Cancer Screening  12/14/2021   Medicare Annual Wellness Visit  11/26/2023   Mammogram  04/08/2024   DTaP/Tdap/Td vaccine (2 - Td or Tdap) 02/02/2026   Flu Shot  Completed   DEXA scan (bone density measurement)  Completed   COVID-19 Vaccine  Completed   Hepatitis C Screening: USPSTF Recommendation to screen - Ages 36-79 yo.  Completed   HPV Vaccine  Aged Out

## 2022-11-25 NOTE — Progress Notes (Signed)
Kathy [name] is a 71 y.o. female who presents for CPE and annual wellness visit and follow-up on chronic medical conditions.  She has the following concerns: none she does have a previous history of CHF as well as aortic atherosclerosis and hypertension.  Presently she is having no chest pain, shortness of breath, PND or DOE.  She has a previous history of sarcoid but at the present time this is quiescent.  She does complain of some arthritic pain but none of any great concern.  She has had a left TKA she is using Enablex for her OAB.  She does follow-up with ophthalmology regularly.  Immunizations and Health Maintenance Immunization History  Administered Date(s) Administered   COVID-19, mRNA, vaccine(Comirnaty)12 years and older 09/23/2022   Fluad Quad(high Dose 65+) 08/31/2019, 09/02/2020, 09/03/2021, 08/20/2022   Influenza Split 08/28/2011, 09/23/2012   Influenza, High Dose Seasonal PF 09/30/2016, 09/01/2017, 08/24/2018   Influenza,inj,Quad PF,6+ Mos 09/13/2013, 08/30/2014, 09/10/2015   PFIZER Comirnaty(Gray Top)Covid-19 Tri-Sucrose Vaccine 03/25/2021   PFIZER(Purple Top)SARS-COV-2 Vaccination 01/23/2020, 02/13/2020, 09/02/2020   PPD Test 01/16/2021   Pfizer Covid-19 Vaccine Bivalent Booster 37yr & up 09/03/2021   Pneumococcal Conjugate-13 08/30/2014   Pneumococcal Polysaccharide-23 02/03/2016   Tdap 02/03/2016   Zoster Recombinat (Shingrix) 09/16/2017   Zoster, Live 01/28/2012   Health Maintenance Due  Topic Date Due   Zoster Vaccines- Shingrix (2 of 2) 11/11/2017   Pneumonia Vaccine 71 Years old (3 - PPSV23 or PCV20) 02/02/2021   COLONOSCOPY (Pts 45-452yrInsurance coverage will need to be confirmed)  12/14/2021    Last Pap smear: Last mammogram: 04/08/2022 Last colonoscopy: 12/14/2016 Last DEXA: 10/20/2018 Dentist: within the last year Ophtho: last eye exam 08/2022 Exercise: walking  Other doctors caring for patient include:Perry  Chandrasejhar    Advanced  directives: yes    Depression screen:  See questionnaire below.     11/25/2022    3:26 PM 11/03/2022   10:08 AM 09/03/2021    8:52 AM 03/25/2021    3:52 PM 09/02/2020    9:32 AM  Depression screen PHQ 2/9  Decreased Interest 0 0 0 0 0  Down, Depressed, Hopeless 1 0 0 0 0  PHQ - 2 Score 1 0 0 0 0    Fall Risk Screen: see questionnaire below.    11/25/2022    3:26 PM 11/03/2022   10:07 AM 09/03/2021    8:51 AM 09/02/2020    9:32 AM 08/31/2019    9:44 AM  Fall Risk   Falls in the past year? 0 0 0 1 0  Number falls in past yr: 0 0 0 0   Injury with Fall?  0 0 0   Risk for fall due to : No Fall Risks No Fall Risks History of fall(s)    Follow up Falls evaluation completed Falls evaluation completed Falls evaluation completed      ADL screen:  See questionnaire below Functional Status Survey: Is the patient deaf or have difficulty hearing?: No Does the patient have difficulty seeing, even when wearing glasses/contacts?: Yes Does the patient have difficulty concentrating, remembering, or making decisions?: No Does the patient have difficulty walking or climbing stairs?: Yes Does the patient have difficulty dressing or bathing?: No Does the patient have difficulty doing errands alone such as visiting a doctor's office or shopping?: No   Review of Systems Constitutional: -, -unexpected weight change, -anorexia, -fatigue Allergy: -sneezing, -itching, -congestion Dermatology: denies changing moles, rash, lumps ENT: -runny nose, -ear pain, -sore throat,  Cardiology:  -  chest pain, -palpitations, -orthopnea, Respiratory: -cough, -shortness of breath, -dyspnea on exertion, -wheezing,  Gastroenterology: -abdominal pain, -nausea, -vomiting, -diarrhea, -constipation, -dysphagia Hematology: -bleeding or bruising problems Musculoskeletal: -arthralgias, -myalgias, -joint swelling, -back pain, - Ophthalmology: -vision changes,  Urology: -dysuria, -difficulty urinating,  -urinary frequency,  -urgency, incontinence Neurology: -, -numbness, , -memory loss, -falls, -dizziness    PHYSICAL EXAM:  BP (!) 158/92   Pulse (!) 110   Temp 98.1 F (36.7 C) (Oral)   Ht 5' 2.5" (1.588 m)   Wt 172 lb 6.4 oz (78.2 kg)   SpO2 98% Comment: room air  BMI 31.03 kg/m   General Appearance: Alert, cooperative, no distress, appears stated age Head: Normocephalic, without obvious abnormality, atraumatic Eyes: PERRL, conjunctiva/corneas clear, EOM's intact, Ears: Normal TM's and external ear canals Nose: Nares normal, mucosa normal, no drainage or sinus tenderness Throat: Lips, mucosa, and tongue normal; teeth and gums normal Neck: Supple, no lymphadenopathy;  thyroid:  no enlargement/tenderness/nodules; no carotid bruit or JVD Lungs: Clear to auscultation bilaterally without wheezes, rales or ronchi; respirations unlabored Heart: Regular rate and rhythm, S1 and S2 normal, no murmur, rubor gallop Abdomen: Soft, non-tender, nondistended, normoactive bowel sounds,  no masses, no hepatosplenomegaly Skin:  Skin color, texture, turgor normal, no rashes or lesions Lymph nodes: Cervical, supraclavicular, and axillary nodes normal Neurologic:  CNII-XII intact, normal strength, sensation and gait; reflexes 2+ and symmetric throughout Psych: Normal mood, affect, hygiene and grooming.  ASSESSMENT/PLAN: Routine general medical examination at a health care facility - Plan: CBC with Differential/Platelet, Comprehensive metabolic panel, Lipid panel  Aortic atherosclerosis (HCC) - Plan: Lipid panel, atorvastatin (LIPITOR) 20 MG tablet  Chronic diastolic CHF (congestive heart failure) (HCC)  Essential hypertension - Plan: CBC with Differential/Platelet, Comprehensive metabolic panel, amLODipine (NORVASC) 5 MG tablet  Pulmonary sarcoidosis (HCC)  Arthritis  Exotropia  Macular degeneration, unspecified laterality, unspecified type  Class 2 obesity due to excess calories without serious comorbidity  in adult, unspecified BMI  History of total left knee replacement (TKR)  Status post cataract extraction, unspecified laterality  OAB (overactive bladder) - Plan: darifenacin (ENABLEX) 15 MG 24 hr tablet  Family history of colon cancer in father - Plan: Ambulatory referral to Gastroenterology    Discussed  yearly mammograms; at least 30 minutes of aerobic activity at least 5 days/week and weight-bearing exercise 2x/week;  healthy diet, including goals of calcium and vitamin D intake and alcohol recommendations (less than or equal to 1 drink/day) reviewed;   Immunization recommendations discussed.  Colonoscopy recommendations reviewed   Medicare Attestation I have personally reviewed: The patient's medical and social history Their use of alcohol, tobacco or illicit drugs Their current medications and supplements The patient's functional ability including ADLs,fall risks, home safety risks, cognitive, and hearing and visual impairment Diet and physical activities Evidence for depression or mood disorders  The patient's weight, height, and BMI have been recorded in the chart.  I have made referrals, counseling, and provided education to the patient based on review of the above and I have provided the patient with a written personalized care plan for preventive services.     Jill Alexanders, MD   11/25/2022

## 2022-11-26 LAB — CBC WITH DIFFERENTIAL/PLATELET
Basophils Absolute: 0.1 10*3/uL (ref 0.0–0.2)
Basos: 1 %
EOS (ABSOLUTE): 0 10*3/uL (ref 0.0–0.4)
Eos: 1 %
Hematocrit: 42.4 % (ref 34.0–46.6)
Hemoglobin: 15.2 g/dL (ref 11.1–15.9)
Immature Grans (Abs): 0 10*3/uL (ref 0.0–0.1)
Immature Granulocytes: 0 %
Lymphocytes Absolute: 1.7 10*3/uL (ref 0.7–3.1)
Lymphs: 30 %
MCH: 34.7 pg — ABNORMAL HIGH (ref 26.6–33.0)
MCHC: 35.8 g/dL — ABNORMAL HIGH (ref 31.5–35.7)
MCV: 97 fL (ref 79–97)
Monocytes Absolute: 0.6 10*3/uL (ref 0.1–0.9)
Monocytes: 11 %
Neutrophils Absolute: 3.3 10*3/uL (ref 1.4–7.0)
Neutrophils: 57 %
Platelets: 226 10*3/uL (ref 150–450)
RBC: 4.38 x10E6/uL (ref 3.77–5.28)
RDW: 12 % (ref 11.7–15.4)
WBC: 5.7 10*3/uL (ref 3.4–10.8)

## 2022-11-26 LAB — COMPREHENSIVE METABOLIC PANEL
ALT: 35 IU/L — ABNORMAL HIGH (ref 0–32)
AST: 101 IU/L — ABNORMAL HIGH (ref 0–40)
Albumin/Globulin Ratio: 1.7 (ref 1.2–2.2)
Albumin: 4.5 g/dL (ref 3.8–4.8)
Alkaline Phosphatase: 122 IU/L — ABNORMAL HIGH (ref 44–121)
BUN/Creatinine Ratio: 23 (ref 12–28)
BUN: 14 mg/dL (ref 8–27)
Bilirubin Total: 1.4 mg/dL — ABNORMAL HIGH (ref 0.0–1.2)
CO2: 21 mmol/L (ref 20–29)
Calcium: 9.6 mg/dL (ref 8.7–10.3)
Chloride: 98 mmol/L (ref 96–106)
Creatinine, Ser: 0.62 mg/dL (ref 0.57–1.00)
Globulin, Total: 2.7 g/dL (ref 1.5–4.5)
Glucose: 92 mg/dL (ref 70–99)
Potassium: 3.5 mmol/L (ref 3.5–5.2)
Sodium: 139 mmol/L (ref 134–144)
Total Protein: 7.2 g/dL (ref 6.0–8.5)
eGFR: 95 mL/min/{1.73_m2} (ref >59)

## 2022-11-26 LAB — LIPID PANEL
Chol/HDL Ratio: 1.8 ratio (ref 0.0–4.4)
Cholesterol, Total: 261 mg/dL — ABNORMAL HIGH (ref 100–199)
HDL: 146 mg/dL (ref >39)
LDL Chol Calc (NIH): 102 mg/dL — ABNORMAL HIGH (ref 0–99)
Triglycerides: 80 mg/dL (ref 0–149)
VLDL Cholesterol Cal: 13 mg/dL (ref 5–40)

## 2022-12-14 ENCOUNTER — Emergency Department (HOSPITAL_COMMUNITY)
Admission: EM | Admit: 2022-12-14 | Discharge: 2022-12-15 | Disposition: A | Discharge status: Home / Self Care | Payer: Medicare Other | Attending: Emergency Medicine | Admitting: Emergency Medicine

## 2022-12-14 ENCOUNTER — Emergency Department (HOSPITAL_COMMUNITY): Payer: Medicare Other

## 2022-12-14 ENCOUNTER — Encounter (HOSPITAL_COMMUNITY): Payer: Self-pay

## 2022-12-14 ENCOUNTER — Other Ambulatory Visit: Payer: Self-pay

## 2022-12-14 DIAGNOSIS — R1110 Vomiting, unspecified: Secondary | ICD-10-CM | POA: Insufficient documentation

## 2022-12-14 DIAGNOSIS — N3000 Acute cystitis without hematuria: Secondary | ICD-10-CM | POA: Diagnosis not present

## 2022-12-14 DIAGNOSIS — I110 Hypertensive heart disease with heart failure: Secondary | ICD-10-CM | POA: Insufficient documentation

## 2022-12-14 DIAGNOSIS — R42 Dizziness and giddiness: Secondary | ICD-10-CM | POA: Insufficient documentation

## 2022-12-14 DIAGNOSIS — R531 Weakness: Secondary | ICD-10-CM | POA: Insufficient documentation

## 2022-12-14 DIAGNOSIS — I509 Heart failure, unspecified: Secondary | ICD-10-CM | POA: Diagnosis not present

## 2022-12-14 DIAGNOSIS — R350 Frequency of micturition: Secondary | ICD-10-CM | POA: Diagnosis present

## 2022-12-14 DIAGNOSIS — R404 Transient alteration of awareness: Secondary | ICD-10-CM | POA: Diagnosis not present

## 2022-12-14 DIAGNOSIS — I499 Cardiac arrhythmia, unspecified: Secondary | ICD-10-CM | POA: Diagnosis not present

## 2022-12-14 DIAGNOSIS — R197 Diarrhea, unspecified: Secondary | ICD-10-CM | POA: Insufficient documentation

## 2022-12-14 DIAGNOSIS — R000 Tachycardia, unspecified: Secondary | ICD-10-CM | POA: Insufficient documentation

## 2022-12-14 DIAGNOSIS — Z79899 Other long term (current) drug therapy: Secondary | ICD-10-CM | POA: Diagnosis not present

## 2022-12-14 DIAGNOSIS — Z743 Need for continuous supervision: Secondary | ICD-10-CM | POA: Diagnosis not present

## 2022-12-14 DIAGNOSIS — Z91040 Latex allergy status: Secondary | ICD-10-CM | POA: Insufficient documentation

## 2022-12-14 LAB — URINALYSIS, ROUTINE W REFLEX MICROSCOPIC
Bilirubin Urine: NEGATIVE
Glucose, UA: NEGATIVE mg/dL
Hgb urine dipstick: NEGATIVE
Ketones, ur: NEGATIVE mg/dL
Nitrite: NEGATIVE
Protein, ur: NEGATIVE mg/dL
Specific Gravity, Urine: 1.009 (ref 1.005–1.030)
pH: 7 (ref 5.0–8.0)

## 2022-12-14 LAB — CBG MONITORING, ED: Glucose-Capillary: 128 mg/dL — ABNORMAL HIGH (ref 70–99)

## 2022-12-14 NOTE — ED Triage Notes (Signed)
Patient BIB [facility] from home. For the past few days patient has been weak and dizzy.

## 2022-12-14 NOTE — ED Provider Triage Note (Signed)
Emergency Medicine Provider Triage Evaluation Note  Kathy [name] , a 72 y.o. female  was evaluated in triage.  Pt complains of generalized weakness, lightheadedness like she is going to pass out that started today. Reports one episode of vomiting and one episode of diarrhea. Denies chest pain, SOB, back pain, dysuria, bloody stools or vomit, abdominal pain, headache, or focal weakness.  Review of Systems  Positive: See HPI Negative: See HPI  Physical Exam  BP (!) 142/82   Pulse 100   Temp 97.8 F (36.6 C) (Oral)   Resp 19   SpO2 100%  Gen:   Awake, no distress   Resp:  Normal effort LCTA MSK:   Moves extremities without difficulty, no LE edema or tenderness Other:  Abdomen soft and nontender, mild tachycardia, regular rhythm, 5/5 strength bilateral UE and LE, normal speech, CNI  Medical Decision Making  Medically screening exam initiated at 2:00 PM.  Appropriate orders placed.  Tiwatope CHANI GHANEM was informed that the remainder of the evaluation will be completed by another provider, this initial triage assessment does not replace that evaluation, and the importance of remaining in the ED until their evaluation is complete.     Suzzette Righter, PA-C 12/14/22 1401

## 2022-12-15 LAB — BASIC METABOLIC PANEL
Anion gap: 12 (ref 5–15)
BUN: 9 mg/dL (ref 8–23)
CO2: 24 mmol/L (ref 22–32)
Calcium: 9.1 mg/dL (ref 8.9–10.3)
Chloride: 101 mmol/L (ref 98–111)
Creatinine, Ser: 0.58 mg/dL (ref 0.44–1.00)
GFR, Estimated: >60 mL/min (ref >60)
Glucose, Bld: 118 mg/dL — ABNORMAL HIGH (ref 70–99)
Potassium: 3.2 mmol/L — ABNORMAL LOW (ref 3.5–5.1)
Sodium: 137 mmol/L (ref 135–145)

## 2022-12-15 LAB — CBC WITH DIFFERENTIAL/PLATELET
Abs Immature Granulocytes: 0.02 10*3/uL (ref 0.00–0.07)
Basophils Absolute: 0.1 10*3/uL (ref 0.0–0.1)
Basophils Relative: 1 %
Eosinophils Absolute: 0 10*3/uL (ref 0.0–0.5)
Eosinophils Relative: 0 %
HCT: 45.5 % (ref 36.0–46.0)
Hemoglobin: 16 g/dL — ABNORMAL HIGH (ref 12.0–15.0)
Immature Granulocytes: 0 %
Lymphocytes Relative: 25 %
Lymphs Abs: 1.1 10*3/uL (ref 0.7–4.0)
MCH: 34.1 pg — ABNORMAL HIGH (ref 26.0–34.0)
MCHC: 35.2 g/dL (ref 30.0–36.0)
MCV: 97 fL (ref 80.0–100.0)
Monocytes Absolute: 0.8 10*3/uL (ref 0.1–1.0)
Monocytes Relative: 18 %
Neutro Abs: 2.5 10*3/uL (ref 1.7–7.7)
Neutrophils Relative %: 56 %
Platelets: 215 10*3/uL (ref 150–400)
RBC: 4.69 MIL/uL (ref 3.87–5.11)
RDW: 12.2 % (ref 11.5–15.5)
WBC: 4.6 10*3/uL (ref 4.0–10.5)
nRBC: 0 % (ref 0.0–0.2)

## 2022-12-15 MED ORDER — SODIUM CHLORIDE 0.9 % IV SOLN
1.0000 g | Freq: Once | INTRAVENOUS | Status: AC
  Administered 2022-12-15: 1 g via INTRAVENOUS
  Filled 2022-12-15: qty 10

## 2022-12-15 MED ORDER — ACETAMINOPHEN 325 MG PO TABS
650.0000 mg | ORAL_TABLET | Freq: Once | ORAL | Status: AC
  Administered 2022-12-15: 650 mg via ORAL
  Filled 2022-12-15: qty 2

## 2022-12-15 MED ORDER — SODIUM CHLORIDE 0.9 % IV BOLUS
1000.0000 mL | Freq: Once | INTRAVENOUS | Status: AC
  Administered 2022-12-15: 1000 mL via INTRAVENOUS

## 2022-12-15 MED ORDER — POTASSIUM CHLORIDE CRYS ER 20 MEQ PO TBCR
40.0000 meq | EXTENDED_RELEASE_TABLET | Freq: Once | ORAL | Status: AC
  Administered 2022-12-15: 40 meq via ORAL
  Filled 2022-12-15: qty 2

## 2022-12-15 MED ORDER — CEPHALEXIN 500 MG PO CAPS: 500.0000 mg | ORAL_CAPSULE | Freq: Two times a day (BID) | ORAL | 0 refills | Status: AC

## 2022-12-15 NOTE — Discharge Instructions (Signed)
Take Keflex as prescribed until finished.  We recommend follow-up with your primary care doctor to ensure resolution of your urinary tract infection.  Continue your daily prescribed medications.  Return for new or concerning symptoms.

## 2022-12-15 NOTE — ED Provider Notes (Signed)
[facility] DEPT Provider Note   CSN: [id number] Arrival date & time: 12/14/22  1317     History  Chief Complaint  Patient presents with   Weakness    Kathy [name] is a 72 y.o. female.  72 year old female with a history of hypertension, CHF, sarcoidosis, fibromyalgia and overactive bladder presents to the emergency department for evaluation of generalized weakness.  Reports that symptoms began today and have been persistent.  She felt lightheaded earlier, as though she was going to pass out, but denies associated syncope.  She did also have 1 episode of vomiting and diarrhea.  She denies any nausea at present and states that her vomiting was only aggravated by eating.  She denies any known sick contacts, fevers, nasal congestion, sore throat, abdominal pain, headache, dysuria or hematuria, melena or hematochezia.  She does report urinary frequency, but believes this to be her baseline secondary to increased PO fluid intake.  The history is provided by the patient. No language interpreter was used.  Weakness      Home Medications Prior to Admission medications   Medication Sig Start Date End Date Taking? Authorizing Provider  cephALEXin (KEFLEX) 500 MG capsule Take 1 capsule (500 mg total) by mouth 2 (two) times daily for 5 days. 12/15/22 12/20/22 Yes Antonietta Breach, PA-C  acetaminophen (TYLENOL) 500 MG tablet Take 2 tablets (1,000 mg total) by mouth every 8 (eight) hours as needed for moderate pain. 11/26/20   Hosie Poisson, MD  amLODipine (NORVASC) 5 MG tablet Take 1 tablet (5 mg total) by mouth daily. 11/25/22   Denita Lung, MD  atorvastatin (LIPITOR) 20 MG tablet Take 1 tablet (20 mg total) by mouth daily. 11/25/22 11/25/23  Denita Lung, MD  cycloSPORINE (RESTASIS) 0.05 % ophthalmic emulsion Place 1 drop into both eyes 2 (two) times daily.    [provider]  darifenacin (ENABLEX) 15 MG 24 hr tablet Take 1 tablet (15 mg total) by  mouth daily. 11/25/22   Denita Lung, MD  docusate sodium (COLACE) 100 MG capsule Take 100 mg by mouth 2 (two) times daily as needed for mild constipation.    [provider]  hyoscyamine (LEVBID) 0.375 MG 12 hr tablet Take 1 tablet (0.375 mg total) by mouth 2 (two) times daily. 12/23/21   Denita Lung, MD  ketoconazole (NIZORAL) 2 % cream Apply topically 2 (two) times daily. 05/06/21   [provider]  Multiple Vitamins-Minerals (PRESERVISION AREDS 2) CAPS Take 1 capsule by mouth 2 (two) times a day.    [provider]  triamcinolone cream (KENALOG) 0.1 % Apply topically 2 (two) times daily. 05/29/21   [provider]      Allergies    Lortab [hydrocodone-acetaminophen] and Latex    Review of Systems   Review of Systems  Neurological:  Positive for weakness.  Ten systems reviewed and are negative for acute change, except as noted in the HPI.    Physical Exam Updated Vital Signs BP (!) 151/87 (BP Location: Left Arm)   Pulse 93   Temp 98.1 F (36.7 C)   Resp 18   SpO2 100%   Physical Exam Vitals and nursing note reviewed.  Constitutional:      General: She is not in acute distress.    Appearance: She is well-developed. She is not diaphoretic.     Comments: Nontoxic-appearing and in no acute distress  HENT:     Head: Normocephalic and atraumatic.  Eyes:  General: No scleral icterus.    Conjunctiva/sclera: Conjunctivae normal.  Cardiovascular:     Rate and Rhythm: Regular rhythm. Tachycardia present.     Pulses: Normal pulses.  Pulmonary:     Effort: Pulmonary effort is normal. No respiratory distress.     Breath sounds: No stridor.     Comments: Respirations even and unlabored Abdominal:     Palpations: Abdomen is soft. There is no mass.     Tenderness: There is no abdominal tenderness. There is no guarding.     Comments: Abdomen soft, nontender, nondistended  Musculoskeletal:        General: Normal range of motion.     Cervical  back: Normal range of motion.  Skin:    General: Skin is warm and dry.     Coloration: Skin is not pale.     Findings: No erythema or rash.  Neurological:     Mental Status: She is alert and oriented to person, place, and time.     Coordination: Coordination normal.     Comments: Ambulatory to the bathroom with one-person assist  Psychiatric:        Behavior: Behavior normal.     ED Results / Procedures / Treatments   Labs (all labs ordered are listed, but only abnormal results are displayed) Labs Reviewed  BASIC METABOLIC PANEL - Abnormal; Notable for the following components:      Result Value   Potassium 3.2 (*)    Glucose, Bld 118 (*)    All other components within normal limits  CBC WITH DIFFERENTIAL/PLATELET - Abnormal; Notable for the following components:   Hemoglobin 16.0 (*)    MCH 34.1 (*)    All other components within normal limits  URINALYSIS, ROUTINE W REFLEX MICROSCOPIC - Abnormal; Notable for the following components:   APPearance CLOUDY (*)    Leukocytes,Ua LARGE (*)    Bacteria, UA MANY (*)    All other components within normal limits  CBG MONITORING, ED - Abnormal; Notable for the following components:   Glucose-Capillary 128 (*)    All other components within normal limits  URINE CULTURE    EKG EKG Interpretation  Date/Time:  Monday December 14 2022 14:50:55 EST Ventricular Rate:  95 PR Interval:  153 QRS Duration: 91 QT Interval:  368 QTC Calculation: 463 R Axis:   1 Text Interpretation: Sinus rhythm Left ventricular hypertrophy Confirmed by Quintella Reichert [phone]) on 12/15/2022 2:22:40 AM  Radiology DG Chest 2 View  Result Date: 12/14/2022 CLINICAL DATA:  Generalized weakness.  Lightheadedness EXAM: CHEST - 2 VIEW COMPARISON:  X-ray 02/13/2021 FINDINGS: Subtle opacity right lung apex, upper lobe with some elevation of the minor fissure. Infiltrate is possible. Please correlate with history. Recommend follow-up. Otherwise no consolidation,  pneumothorax or effusion. No edema. Normal cardiopericardial silhouette with calcified aorta. IMPRESSION: Elevated minor fissure with some opacity in the right upper lobe. Infiltrate is possible. Recommend follow-up Electronically Signed   By: Jill Side M.D.   On: 12/14/2022 14:48    Procedures Procedures    Medications Ordered in ED Medications  potassium chloride SA (KLOR-CON M) CR tablet 40 mEq (40 mEq Oral Given 12/15/22 0216)  sodium chloride 0.9 % bolus 1,000 mL (0 mLs Intravenous Stopped 12/15/22 0355)  cefTRIAXone (ROCEPHIN) 1 g in sodium chloride 0.9 % 100 mL IVPB (0 g Intravenous Stopped 12/15/22 0355)  acetaminophen (TYLENOL) tablet 650 mg (650 mg Oral Given 12/15/22 0217)    ED Course/ Medical Decision Making/ A&P Clinical Course as  of 12/15/22 0404  Tue Dec 15, 2022  0341 Per chart review, patient appears to have baseline tachycardia with heart rate ranging from 100-115bpm since 2022. Her vital signs today are in keeping with her historic trends. [KH]    Clinical Course User Index [KH] Antonietta Breach, PA-C                             Medical Decision Making Amount and/or Complexity of Data Reviewed Labs: ordered.  Risk OTC drugs. Prescription drug management.   This patient presents to the ED for concern of generalized weakness, this involves an extensive number of treatment options, and is a complaint that carries with it a high risk of complications and morbidity.  The differential diagnosis includes viral illness vs PNA vs UTI vs dehydration vs anemia vs chronic fatigue syndrome   Co morbidities that complicate the patient evaluation  Fibromyalgia  HTN Sarcoidosis    Additional history obtained:  Additional history obtained from son, at bedside External records from outside source obtained and reviewed including historical heart rate to ascertain baseline. Patient typically with HR ranging from 100-110bpm since 2022   Lab Tests:  I Ordered, and  personally interpreted labs.  The pertinent results include:  Mild hypokalemia of 3.2. UA with bacteriuria and pyuria; WBCs 21-50.   Imaging Studies ordered:  I ordered imaging studies including CXR  I independently visualized and interpreted imaging which showed no acute cardiopulmonary abnormality Visualized imaging without concern for developing infiltrate. I otherwise agree with the radiologist interpretation   Cardiac Monitoring:  The patient was maintained on a cardiac monitor.  I personally viewed and interpreted the cardiac monitored which showed an underlying rhythm of: sinus tachycardia > NSR   Medicines ordered and prescription drug management:  I ordered medication including IVF for rehydration and IV Rocephin for treatment of presumed UTI.  Reevaluation of the patient after these medicines showed that the patient improved I have reviewed the patients home medicines and have made adjustments as needed   Test Considered:  Covid/Flu/RSV swab - however, patient febrile and without URI symptoms. No known sick contacts.   Problem List / ED Course:  As above   Reevaluation:  After the interventions noted above, I reevaluated the patient and found that they have :improved   Social Determinants of Health:  Insured patient Established with primary care doctor   Dispostion:  After consideration of the diagnostic results and the patients response to treatment, I feel that the patent would benefit from outpatient course of abx for treatment of UTI. Encouraged f/u to ensure clearing infection. Return precautions discussed and provided. Patient discharged in stable condition with no unaddressed concerns.          Final Clinical Impression(s) / ED Diagnoses Final diagnoses:  Acute cystitis without hematuria    Rx / DC Orders ED Discharge Orders          Ordered    cephALEXin (KEFLEX) 500 MG capsule  2 times daily        12/15/22 0403               Antonietta Breach, PA-C 12/16/22 0327    Quintella Reichert, MD 12/18/22 1702

## 2022-12-17 ENCOUNTER — Telehealth: Payer: Self-pay

## 2022-12-17 LAB — URINE CULTURE: Culture: >=100000 — AB

## 2022-12-17 NOTE — Patient Outreach (Signed)
  Care Coordination TOC Note Transition Care Management Unsuccessful Follow-up Telephone Call  Date of discharge and from where:  12/15/22-[facility]  Attempts:  1st Attempt  Reason for unsuccessful TCM follow-up call:  Left voice message   Enzo Montgomery, RN,BSN,CCM [facility] Telephonic Care Management Coordinator Direct Phone: [phone] Toll Free: [phone] Fax: [phone]

## 2022-12-18 ENCOUNTER — Telehealth (HOSPITAL_BASED_OUTPATIENT_CLINIC_OR_DEPARTMENT_OTHER): Payer: Self-pay | Admitting: Emergency Medicine

## 2022-12-18 ENCOUNTER — Telehealth: Payer: Self-pay

## 2022-12-18 NOTE — Patient Outreach (Signed)
  Care Coordination TOC Note Transition Care Management Follow-up Telephone Call Date of discharge and from where: 12/15/22-[facility]  Dx: "acute cystitis w/out hematuria" How have you been since you were released from the hospital? Incoming call from patient returning RN CM call. Patient states she still not feeling completely back to normal. She reports that she has had some diarrhea about 2-3x/day. She is taking Pepto-Bismol with some relief. Denies any n/v. Appetite remains fair. She voice she is drinking plenty of water-staying hydrated and no issues with urinary elimination. She voices that she is taking abx therapy as ordered. Patient states she went yesterday and got RSV vaccine.  Any questions or concerns? No  Items Reviewed: Did the pt receive and understand the discharge instructions provided? Yes  Medications obtained and verified? Yes  Other? No  Any new allergies since your discharge? No  Dietary orders reviewed? Yes Do you have support at home? Yes   Home Care and Equipment/Supplies: Were home health services ordered? not applicable If so, what is the name of the agency? N/A  Has the agency set up a time to come to the patient's home? not applicable Were any new equipment or medical supplies ordered?  No What is the name of the medical supply agency? N/A Were you able to get the supplies/equipment? not applicable Do you have any questions related to the use of the equipment or supplies? No  Functional Questionnaire: (I = Independent and D = Dependent) ADLs: I  Bathing/Dressing- I  Meal Prep- I  Eating- I  Maintaining continence- I  Transferring/Ambulation- I  Managing Meds- I  Follow up appointments reviewed:  PCP Hospital f/u appt confirmed? Yes  Scheduled to see Dr. Redmond School on 12/23/22 @ 11:45 am. [facility] f/u appt confirmed?  N/A   Are transportation arrangements needed? No  If their condition worsens, is the pt aware to call PCP or go to the  Emergency Dept.? Yes Was the patient provided with contact information for the PCP's office or ED? Yes Was to pt encouraged to call back with questions or concerns? Yes  SDOH assessments and interventions completed:   Yes SDOH Interventions Today    Flowsheet Row Most Recent Value  SDOH Interventions   Food Insecurity Interventions Intervention Not Indicated  Transportation Interventions Intervention Not Indicated       Care Coordination Interventions:  Education provided    Encounter Outcome:  Pt. Visit Completed    Enzo Montgomery, RN,BSN,CCM [facility] Telephonic Care Management Coordinator Direct Phone: [phone] Toll Free: [phone] Fax: [phone]

## 2022-12-18 NOTE — Patient Outreach (Signed)
  Care Coordination TOC Note Transition Care Management Unsuccessful Follow-up Telephone Call  Date of discharge and from where:  12/15/22-[facility]  Attempts:  2nd Attempt  Reason for unsuccessful TCM follow-up call:  No answer/busy    Enzo Montgomery, RN,BSN,CCM [facility] Telephonic Care Management Coordinator Direct Phone: [phone] Toll Free: [phone] Fax: [phone]

## 2022-12-18 NOTE — Telephone Encounter (Signed)
Post ED Visit - Positive Culture Follow-up  Culture report reviewed by antimicrobial stewardship pharmacist: [facility] Team '[]'$  Elenor Quinones, Pharm.D. '[x]'$  Heide Guile, Pharm.D., BCPS AQ-ID '[]'$  Parks Neptune, Pharm.D., BCPS '[]'$  Alycia Rossetti, Pharm.D., BCPS '[]'$  [place], [place].D., BCPS, AAHIVP '[]'$  Legrand Como, Pharm.D., BCPS, AAHIVP '[]'$  Salome Arnt, PharmD, BCPS '[]'$  Johnnette Gourd, PharmD, BCPS '[]'$  Hughes Better, PharmD, BCPS '[]'$  Leeroy Cha, PharmD '[]'$  Laqueta Linden, PharmD, BCPS '[]'$  Albertina Parr, PharmD  [facility] Team '[]'$  Leodis Sias, PharmD '[]'$  Lindell Spar, PharmD '[]'$  Royetta Asal, PharmD '[]'$  Graylin Shiver, Rph '[]'$  Rema Fendt) Glennon Mac, PharmD '[]'$  Arlyn Dunning, PharmD '[]'$  Netta Cedars, PharmD '[]'$  Dia Sitter, PharmD '[]'$  Leone Haven, PharmD '[]'$  Gretta Arab, PharmD '[]'$  Theodis Shove, PharmD '[]'$  Peggyann Juba, PharmD '[]'$  Reuel Boom, PharmD   Positive urine culture Treated with Cephalexin, organism sensitive to the same and no further patient follow-up is required at this time.  Milus Mallick 12/18/2022, 2:33 PM

## 2022-12-18 NOTE — Patient Outreach (Signed)
  Care Coordination TOC Note Transition Care Management Unsuccessful Follow-up Telephone Call  Date of discharge and from where:  12/15/22-[facility]  Attempts:  3rd Attempt  Reason for unsuccessful TCM follow-up call:  Unable to reach patient    Enzo Montgomery, RN,BSN,CCM [facility] Telephonic Care Management Coordinator Direct Phone: [phone] Toll Free: [phone] Fax: [phone]

## 2022-12-23 ENCOUNTER — Ambulatory Visit (INDEPENDENT_AMBULATORY_CARE_PROVIDER_SITE_OTHER): Payer: Medicare Other | Admitting: Family Medicine

## 2022-12-23 ENCOUNTER — Encounter: Payer: Self-pay | Admitting: Family Medicine

## 2022-12-23 VITALS — BP 140/80 | HR 102 | Temp 97.9°F | Resp 24 | Wt 174.0 lb

## 2022-12-23 DIAGNOSIS — Z800 Family history of malignant neoplasm of digestive organs: Secondary | ICD-10-CM

## 2022-12-23 DIAGNOSIS — N3000 Acute cystitis without hematuria: Secondary | ICD-10-CM | POA: Diagnosis not present

## 2022-12-23 DIAGNOSIS — E876 Hypokalemia: Secondary | ICD-10-CM | POA: Diagnosis not present

## 2022-12-23 NOTE — Progress Notes (Signed)
   Subjective:    Patient ID: Kathy [name], female    DOB: 1951/09/02, 72 y.o.   MRN: [id number]  HPI She is here for follow-up visit after recent emergency room visit For evaluation of weakness and was subsequently found to have UTI and also low potassium.  She was given Keflex and states that she is feeling much better.  She really had no UTI type symptoms. She also has concerns over getting a repeat colonoscopy.  There is a family history of colon cancer. Review of Systems     Objective:   Physical Exam Alert and in no distress. Tympanic membranes and canals are normal. Pharyngeal area is normal. Neck is supple without adenopathy or thyromegaly. Cardiac exam shows a regular sinus rhythm without murmurs or gallops. Lungs are clear to auscultation. Review of the record indicates she had a colonoscopy in 2018 but it was normal except for internal hemorrhoids.       Assessment & Plan:  Acute cystitis without hematuria  Hypokalemia - Plan: Basic Metabolic Panel  Family history of colon cancer in father I will check on the electrolytes to make sure her potassium up to normal.  No therapy for the UTI as she seems to be doing fine and did get a 5-day course of Keflex. She is to call [facility] as she was seen by Dr. Henrene Pastor in 2018.  She is comfortable with this.

## 2022-12-24 LAB — BASIC METABOLIC PANEL
BUN/Creatinine Ratio: 12 (ref 12–28)
BUN: 7 mg/dL — ABNORMAL LOW (ref 8–27)
CO2: 23 mmol/L (ref 20–29)
Calcium: 9.2 mg/dL (ref 8.7–10.3)
Chloride: 102 mmol/L (ref 96–106)
Creatinine, Ser: 0.6 mg/dL (ref 0.57–1.00)
Glucose: 92 mg/dL (ref 70–99)
Potassium: 3.2 mmol/L — ABNORMAL LOW (ref 3.5–5.2)
Sodium: 141 mmol/L (ref 134–144)
eGFR: 96 mL/min/{1.73_m2} (ref >59)

## 2023-01-29 IMAGING — DX DG CHEST 1V PORT
1 series · 1 of 1 positions shown · non-contrast
Comparison: April 24, 2016

CLINICAL DATA: Hypertension and fatigue

EXAM:
PORTABLE CHEST 1 VIEW

[chest ap]
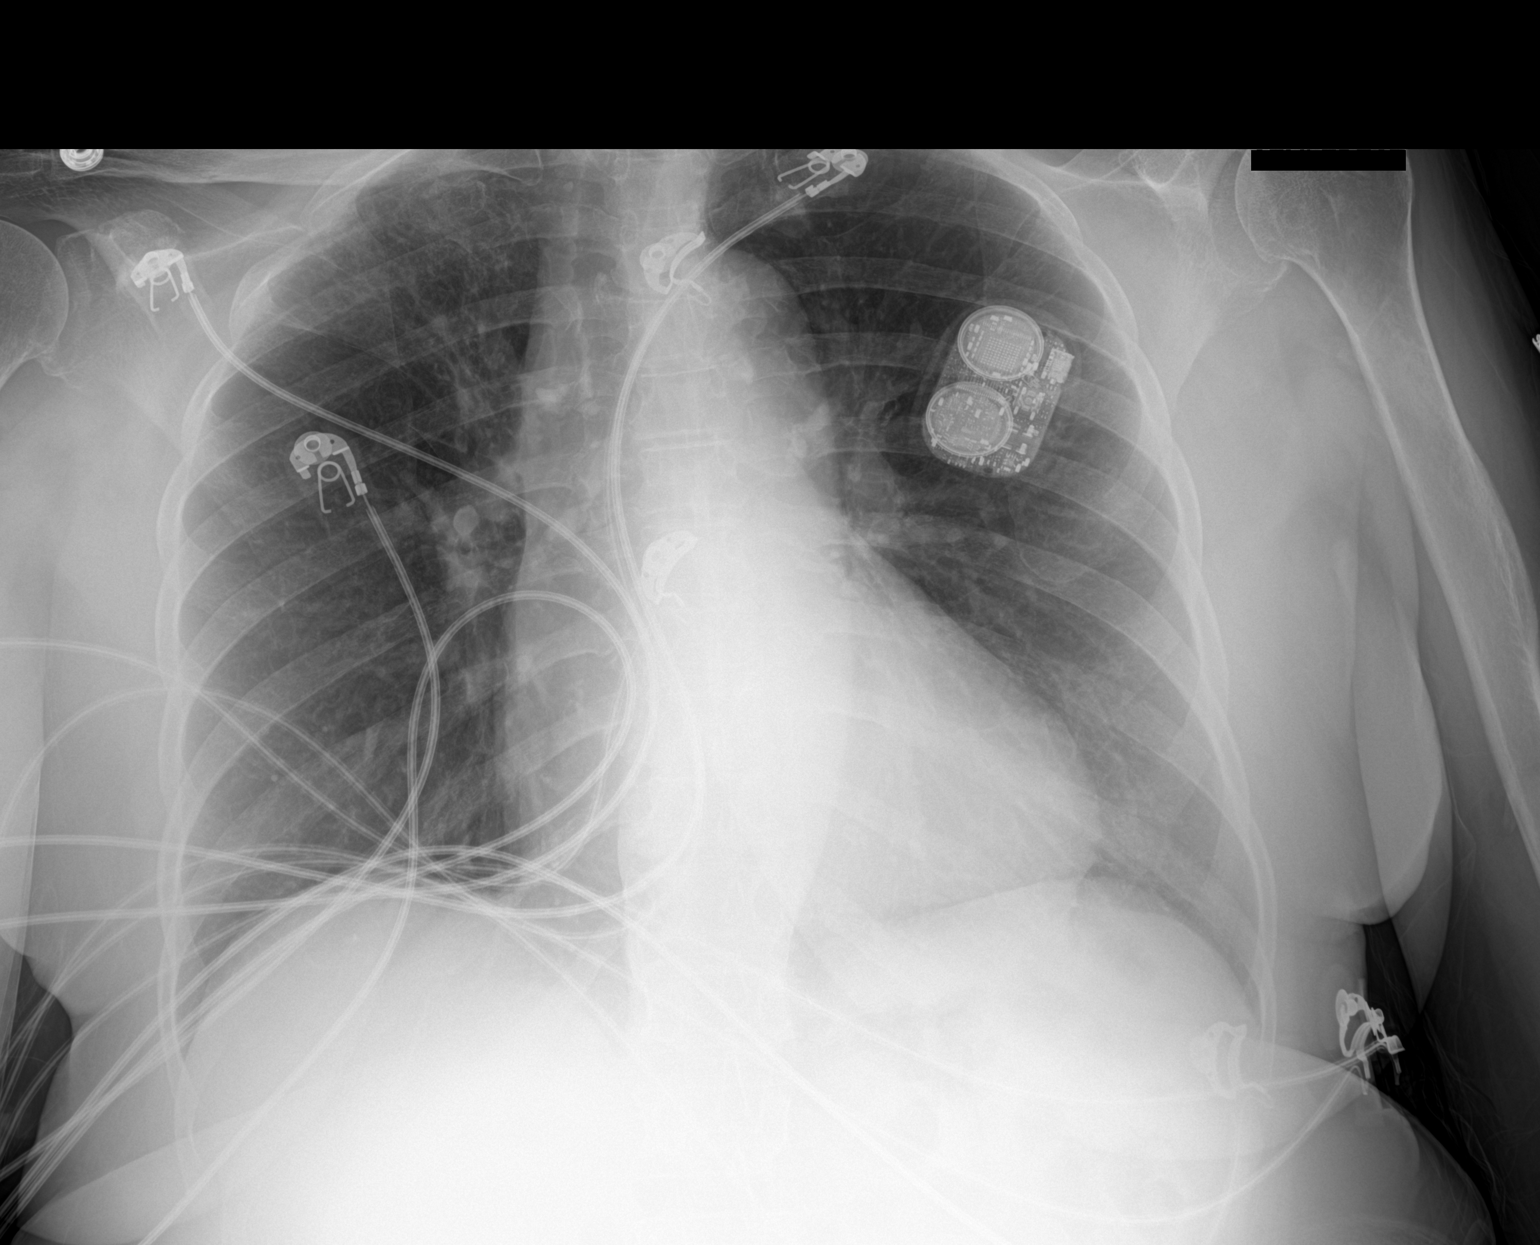

[1 of 1 positions shown; findings below may reference images not displayed]

FINDINGS: Lungs are clear. Heart is upper normal in size with pulmonary
vascularity normal. No adenopathy. Several calcified nonenlarged
lymph nodes. Monitor device on the left. No bone lesions.
IMPRESSION: Lungs clear. Heart upper normal in size. Several calcified
nonenlarged lymph nodes suggesting prior granulomatous disease.

## 2023-02-08 ENCOUNTER — Encounter: Payer: Self-pay | Admitting: Internal Medicine

## 2023-02-18 ENCOUNTER — Ambulatory Visit: Payer: Medicare Other | Admitting: Podiatry

## 2023-02-18 ENCOUNTER — Ambulatory Visit (AMBULATORY_SURGERY_CENTER): Payer: Medicare Other | Admitting: *Deleted

## 2023-02-18 ENCOUNTER — Encounter: Payer: Self-pay | Admitting: Internal Medicine

## 2023-02-18 VITALS — Ht 62.25 in | Wt 170.0 lb

## 2023-02-18 DIAGNOSIS — B351 Tinea unguium: Secondary | ICD-10-CM | POA: Diagnosis not present

## 2023-02-18 DIAGNOSIS — Z800 Family history of malignant neoplasm of digestive organs: Secondary | ICD-10-CM

## 2023-02-18 DIAGNOSIS — M79674 Pain in right toe(s): Secondary | ICD-10-CM | POA: Diagnosis not present

## 2023-02-18 DIAGNOSIS — M79675 Pain in left toe(s): Secondary | ICD-10-CM | POA: Diagnosis not present

## 2023-02-18 MED ORDER — NA SULFATE-K SULFATE-MG SULF 17.5-3.13-1.6 GM/177ML PO SOLN: 1.0000 | Freq: Once | ORAL | 0 refills | Status: AC

## 2023-02-18 NOTE — Progress Notes (Signed)

## 2023-02-20 NOTE — Progress Notes (Signed)
Subjective: Chief Complaint  Patient presents with   routine foot care    72 y.o. returns the office today for painful, elongated, thickened toenails which she cannot trim herself. Denies any redness or drainage around the nails.  She has no other concerns today.  No recent injuries or changes otherwise.  PCP: Denita Lung, MD Last Seen: December 23, 2022  Objective: AAO 3, NAD DP/PT pulses palpable, CRT less than 3 seconds Protective sensation intact with Derrel Nip monofilament Nails hypertrophic, dystrophic, elongated, brittle, discolored 10. There is tenderness overlying the nails 1-5 bilaterally. There is no surrounding erythema or drainage along the nail sites. No pain with calf compression, swelling, warmth, erythema.  Assessment: Patient presents with symptomatic onychomycosis  Plan: -Treatment options including alternatives, risks, complications were discussed -Nails sharply debrided 10 without complication/bleeding. -Discussed daily foot inspection. If there are any changes, to call the office immediately.  -Follow-up in 3 months or sooner if any problems are to arise. In the meantime, encouraged to call the office with any questions, concerns, changes symptoms.  Kathy [name], DPM

## 2023-03-23 ENCOUNTER — Encounter: Payer: Self-pay | Admitting: Internal Medicine

## 2023-03-23 ENCOUNTER — Ambulatory Visit (AMBULATORY_SURGERY_CENTER): Payer: Medicare Other | Admitting: Internal Medicine

## 2023-03-23 VITALS — BP 124/73 | HR 88 | Temp 98.0°F | Resp 12 | Wt 170.0 lb

## 2023-03-23 DIAGNOSIS — Z800 Family history of malignant neoplasm of digestive organs: Secondary | ICD-10-CM | POA: Diagnosis not present

## 2023-03-23 DIAGNOSIS — Z1211 Encounter for screening for malignant neoplasm of colon: Secondary | ICD-10-CM

## 2023-03-23 DIAGNOSIS — M797 Fibromyalgia: Secondary | ICD-10-CM | POA: Diagnosis not present

## 2023-03-23 MED ORDER — SODIUM CHLORIDE 0.9 % IV SOLN: 500.0000 mL | INTRAVENOUS | Status: DC

## 2023-03-23 NOTE — Progress Notes (Signed)
HISTORY OF PRESENT ILLNESS:  Kathy [name] is a 72 y.o. female history of colon cancer in mother less than age 2.  Previous high rescreening exams have been negative for neoplasia.  Presents for follow-up screening at this time  REVIEW OF SYSTEMS:  All non-GI ROS negative except for  Past Medical History:  Diagnosis Date   Cataract    BILATERAL,REMOVED   Chronic diastolic CHF (congestive heart failure) 01/13/2021   Exotropia of left eye    Fibromyalgia    GERD (gastroesophageal reflux disease)    History of uterine leiomyoma    Hyperlipidemia    Hypertension    Macular degeneration    OA (osteoarthritis)    Overactive bladder    Sarcoidosis    Sickle cell trait    Wears glasses     Past Surgical History:  Procedure Laterality Date   ABDOMINAL HYSTERECTOMY  2013   ADJUSTABLE SUTURE MANIPULATION Left 04/10/2015   Procedure: ADJUSTABLE SUTURE MANIPULATION;  Surgeon: Aura Camps, MD;  Location: [facility];  Service: Ophthalmology;  Laterality: Left;   CATARACT EXTRACTION Bilateral 05/2019   Dr. Nile Riggs   EYE SURGERY Bilateral 05/2019   Cat Sx - Dr. Nile Riggs   KNEE SURGERY Right    LAPROSCOPIC   MEDIAN RECTUS REPAIR Left 04/10/2015   Procedure: MEDIAN RECTUS RESECTION LATERAL RECTUS RECESSION,AJUSTABLES SUTURES LEFT EYE;  Surgeon: Aura Camps, MD;  Location: [facility];  Service: Ophthalmology;  Laterality: Left;   TONSILLECTOMY  as child   TOTAL ABDOMINAL HYSTERECTOMY W/ BILATERAL SALPINGOOPHORECTOMY  10/14/2010   and TVT midurethral sling and culdoplasty   TOTAL KNEE ARTHROPLASTY Left 06/15/2019   Procedure: TOTAL KNEE ARTHROPLASTY;  Surgeon: Durene Romans, MD;  Location: [facility];  Service: Orthopedics;  Laterality: Left;  70 mins    Social History [employer]  reports that she has never smoked. She has never used smokeless tobacco. She reports current alcohol use of about 1.0 standard drink of alcohol per week. She reports  that she does not use drugs.  family history includes Colon cancer in her mother; Diabetes in her father, sister, and sister; Hypertension in her sister, sister, and sister.  Allergies  Allergen Reactions   Lortab [Hydrocodone-Acetaminophen] Nausea And Vomiting   Latex Other (See Comments)    Pt unsure of reaction!       PHYSICAL EXAMINATION: Vital signs: BP (!) 143/75   Pulse (!) 103   Temp 98 F (36.7 C) (Temporal)   Wt 170 lb (77.1 kg)   SpO2 99%   BMI 30.84 kg/m  General: Well-developed, well-nourished, no acute distress HEENT: Sclerae are anicteric, conjunctiva pink. Oral mucosa intact Lungs: Clear Heart: Regular Abdomen: soft, nontender, nondistended, no obvious ascites, no peritoneal signs, normal bowel sounds. No organomegaly. Extremities: No edema Psychiatric: alert and oriented x3. Cooperative     ASSESSMENT:   Family history of colon cancer  PLAN:   Screening colonoscopy

## 2023-03-23 NOTE — Progress Notes (Signed)
Pt's states no medical or surgical changes since previsit or office visit. 

## 2023-03-23 NOTE — Op Note (Signed)
[facility] Patient Name: Kathy [name] Procedure Date: 03/23/2023 9:47 AM MRN: [id number] Endoscopist: Wilhemina Bonito. Marina Goodell , MD, [id number] Age: 72 Referring MD:  Date of Birth: [birth year] Gender: Female Account #: [id number] Procedure:                Colonoscopy Indications:              Screening in patient at increased risk: Colorectal                            cancer in mother before age 95. Previous                            examinations [place]; 2018; both negative for                            neoplasia Medicines:                Monitored Anesthesia Care Procedure:                Pre-Anesthesia Assessment:                           - Prior to the procedure, a History and Physical                            was performed, and patient medications and                            allergies were reviewed. The patient's tolerance of                            previous anesthesia was also reviewed. The risks                            and benefits of the procedure and the sedation                            options and risks were discussed with the patient.                            All questions were answered, and informed consent                            was obtained. Prior Anticoagulants: The patient has                            taken no anticoagulant or antiplatelet agents. ASA                            Grade Assessment: II - A patient with mild systemic                            disease. After reviewing the risks and benefits,  the patient was deemed in satisfactory condition to                            undergo the procedure.                           After obtaining informed consent, the colonoscope                            was passed under direct vision. Throughout the                            procedure, the patient's blood pressure, pulse, and                            oxygen saturations were monitored continuously. The                             Olympus CF-HQ190L ([id number]) Colonoscope was                            introduced through the anus and advanced to the the                            cecum, identified by appendiceal orifice and                            ileocecal valve. The ileocecal valve, appendiceal                            orifice, and rectum were photographed. The quality                            of the bowel preparation was excellent. The                            colonoscopy was performed without difficulty. The                            patient tolerated the procedure well. The bowel                            preparation used was SUPREP via split dose                            instruction. Scope In: 9:57:48 AM Scope Out: 10:12:55 AM Scope Withdrawal Time: 0 hours 7 minutes 37 seconds  Total Procedure Duration: 0 hours 15 minutes 7 seconds  Findings:                 A few diverticula were found in the right colon.                           The exam was otherwise without abnormality on  direct and retroflexion views. Complications:            No immediate complications. Estimated blood loss:                            None. Estimated Blood Loss:     Estimated blood loss: none. Impression:               - Diverticulosis in the right colon.                           - The examination was otherwise normal on direct                            and retroflexion views.                           - No specimens collected. Recommendation:           - Repeat colonoscopy in 5 years for screening                            purposes (family history).                           - Patient has a contact number available for                            emergencies. The signs and symptoms of potential                            delayed complications were discussed with the                            patient. Return to normal activities tomorrow.                             Written discharge instructions were provided to the                            patient.                           - Resume previous diet.                           - Continue present medications. Wilhemina Bonito. Marina Goodell, MD 03/23/2023 10:18:46 AM This report has been signed electronically.

## 2023-03-23 NOTE — Progress Notes (Signed)
Vss nad trans to pacu 

## 2023-03-23 NOTE — Patient Instructions (Signed)
YOU HAD AN ENDOSCOPIC PROCEDURE TODAY AT THE [facility]:   Refer to the procedure report that was given to you for any specific questions about what was found during the examination.  If the procedure report does not answer your questions, please call your gastroenterologist to clarify.  If you requested that your care partner not be given the details of your procedure findings, then the procedure report has been included in a sealed envelope for you to review at your convenience later.  **Handout given on diverticulosis**  YOU SHOULD EXPECT: Some feelings of bloating in the abdomen. Passage of more gas than usual.  Walking can help get rid of the air that was put into your GI tract during the procedure and reduce the bloating. If you had a lower endoscopy (such as a colonoscopy or flexible sigmoidoscopy) you may notice spotting of blood in your stool or on the toilet paper. If you underwent a bowel prep for your procedure, you may not have a normal bowel movement for a few days.  Please Note:  You might notice some irritation and congestion in your nose or some drainage.  This is from the oxygen used during your procedure.  There is no need for concern and it should clear up in a day or so.  SYMPTOMS TO REPORT IMMEDIATELY:  Following lower endoscopy (colonoscopy or flexible sigmoidoscopy):  Excessive amounts of blood in the stool  Significant tenderness or worsening of abdominal pains  Swelling of the abdomen that is new, acute  Fever of 100F or higher   For urgent or emergent issues, a gastroenterologist can be reached at any hour by calling [phone]. Do not use MyChart messaging for urgent concerns.    DIET:  We do recommend a small meal at first, but then you may proceed to your regular diet.  Drink plenty of fluids but you should avoid alcoholic beverages for 24 hours.  ACTIVITY:  You should plan to take it easy for the rest of today and you should NOT DRIVE or use  heavy machinery until tomorrow (because of the sedation medicines used during the test).    FOLLOW UP:  **Repeat colonoscopy in 5 years**  Our staff will call the number listed on your records the next business day following your procedure.  We will call around 7:15- 8:00 am to check on you and address any questions or concerns that you may have regarding the information given to you following your procedure. If we do not reach you, we will leave a message.     If any biopsies were taken you will be contacted by phone or by letter within the next 1-3 weeks.  Please call us at [phone] if you have not heard about the biopsies in 3 weeks.    SIGNATURES/CONFIDENTIALITY: You and/or your care partner have signed paperwork which will be entered into your electronic medical record.  These signatures attest to the fact that that the information above on your After Visit Summary has been reviewed and is understood.  Full responsibility of the confidentiality of this discharge information lies with you and/or your care-partner.

## 2023-03-24 ENCOUNTER — Telehealth: Payer: Self-pay

## 2023-03-24 NOTE — Telephone Encounter (Signed)
  Follow up Call-     03/23/2023    8:53 AM  Call back number  Post procedure Call Back phone  # [phone]  Permission to leave phone message Yes     Patient questions:  Do you have a fever, pain , or abdominal swelling? No. Pain Score  0 *  Have you tolerated food without any problems? Yes.    Have you been able to return to your normal activities? Yes.    Do you have any questions about your discharge instructions: Diet   No. Medications  No. Follow up visit  No.  Do you have questions or concerns about your Care? No.  Actions: * If pain score is 4 or above: No action needed, pain <4.

## 2023-04-14 DIAGNOSIS — Z1231 Encounter for screening mammogram for malignant neoplasm of breast: Secondary | ICD-10-CM | POA: Diagnosis not present

## 2023-04-14 LAB — HM MAMMOGRAPHY

## 2023-04-21 ENCOUNTER — Encounter: Payer: Self-pay | Admitting: Family Medicine

## 2023-05-21 ENCOUNTER — Ambulatory Visit (INDEPENDENT_AMBULATORY_CARE_PROVIDER_SITE_OTHER): Payer: Medicare Other

## 2023-05-21 ENCOUNTER — Ambulatory Visit: Payer: Medicare Other | Admitting: Podiatry

## 2023-05-21 DIAGNOSIS — B351 Tinea unguium: Secondary | ICD-10-CM | POA: Diagnosis not present

## 2023-05-21 DIAGNOSIS — M778 Other enthesopathies, not elsewhere classified: Secondary | ICD-10-CM

## 2023-05-21 DIAGNOSIS — M2022 Hallux rigidus, left foot: Secondary | ICD-10-CM

## 2023-05-21 DIAGNOSIS — M79674 Pain in right toe(s): Secondary | ICD-10-CM | POA: Diagnosis not present

## 2023-05-21 DIAGNOSIS — M79675 Pain in left toe(s): Secondary | ICD-10-CM | POA: Diagnosis not present

## 2023-05-21 MED ORDER — TRIAMCINOLONE ACETONIDE 10 MG/ML IJ SUSP
10.0000 mg | Freq: Once | INTRAMUSCULAR | Status: AC
Start: 2023-05-21 — End: 2023-05-21
  Administered 2023-05-21: 10 mg

## 2023-05-21 NOTE — Patient Instructions (Signed)

## 2023-05-25 NOTE — Progress Notes (Signed)
Chief Complaint  Patient presents with   Nail Problem    Routine Foot Care- Nail trim     72 y.o. returns the office today for painful, elongated, thickened toenails which she cannot trim herself. Denies any redness or drainage around the nails.  She states the left big toe joints been hurting.  No recent injuries that she reports.  Surgery on the right first and doing well without any pain.  PCP: Ronnald Nian, MD Last Seen: December 23, 2022  Objective: AAO 3, NAD DP/PT pulses palpable, CRT less than 3 seconds Protective sensation intact with [name] monofilament Nails hypertrophic, dystrophic, elongated, brittle, discolored 10. There is tenderness overlying the nails 1-5 bilaterally. There is no surrounding erythema or drainage along the nail sites. Spurring present on the dorsal first MTPJ of the left foot there is decreased range of motion with crepitation with range of motion of first MPJ on the left foot.  Trace edema.  There is no erythema. No pain with calf compression, swelling, warmth, erythema.  Assessment: Patient presents with symptomatic onychomycosis; capsulitis, hallux rigidus left foot  Plan: -Treatment options including alternatives, risks, complications were discussed -X-rays were obtained reviewed left foot.  3 views of the foot were obtained.  Significant arthritic changes present the first MPJ. -Regards to the left foot we discussed steroid injection she wants to proceed.  I cleaned the skin with Betadine, alcohol mixture of 1 cc.  Kenalog 10, 0.5 cc Marcaine plain, 0.5 cc of lidocaine plain was infiltrated into the first CBG without complications.  Postinjection care discussed.  Tolerated well.  Discussed wearing stiffer soled shoes.  Any future consider surgical intervention if needed open follow-up on that for now. -Nails sharply debrided 10 without complication/bleeding. -Discussed daily foot inspection. If there are any changes, to call the office  immediately.  -Follow-up in 3 months or sooner if any problems are to arise. In the meantime, encouraged to call the office with any questions, concerns, changes symptoms.  Ovid Curd, DPM

## 2023-06-07 ENCOUNTER — Encounter (INDEPENDENT_AMBULATORY_CARE_PROVIDER_SITE_OTHER): Payer: Medicare Other | Admitting: Ophthalmology

## 2023-06-08 NOTE — Progress Notes (Signed)
[facility] - Clinic Note  06/22/2023     CHIEF COMPLAINT Patient presents for Retina Follow Up    HISTORY OF PRESENT ILLNESS: Kathy [name] is a 72 y.o. female who presents to the clinic today for:  HPI     Retina Follow Up   In right eye.  This started 1 year ago.  Duration of 1 year.  Since onset it is stable.  I, the attending physician,  performed the HPI with the patient and updated documentation appropriately.        Comments   1 year retina follow up retinoschisis pt is reporting no vision changes noticed she has floaters at times but denies any flashes of light       Last edited by Rennis Chris, MD on 06/22/2023  8:40 PM.    Pt states sometimes she sees things that are not there  Referring physician: Ronnald Nian, MD [address] [place],  [place] [address]  HISTORICAL INFORMATION:   Selected notes from the MEDICAL RECORD NUMBER Referred by Dr. Aura Camps for concern of central areolar choroidal dystrophy LEE: 05.16.19 (MKarleen Hampshire) [BCVA: OD: 20/30-2 OS: CF] Ocular Hx-Central areolar choroidal dystrophy, Macular Degeneration, exotropia OS, cataracts OU, dry eyes PMH-HTN, Fibromyalgia    CURRENT MEDICATIONS: Current Outpatient Medications (Ophthalmic Drugs)  Medication Sig   cycloSPORINE (RESTASIS) 0.05 % ophthalmic emulsion Place 1 drop into both eyes 2 (two) times daily.   No current facility-administered medications for this visit. (Ophthalmic Drugs)   Current Outpatient Medications (Other)  Medication Sig   acetaminophen (TYLENOL) 500 MG tablet Take 2 tablets (1,000 mg total) by mouth every 8 (eight) hours as needed for moderate pain.   amLODipine (NORVASC) 5 MG tablet Take 1 tablet (5 mg total) by mouth daily.   atorvastatin (LIPITOR) 20 MG tablet Take 1 tablet (20 mg total) by mouth daily.   bismuth subsalicylate (PEPTO BISMOL) 262 MG chewable tablet Chew 524 mg by mouth as needed for diarrhea or loose stools.    darifenacin (ENABLEX) 15 MG 24 hr tablet Take 1 tablet (15 mg total) by mouth daily. (Patient not taking: Reported on 02/18/2023)   docusate sodium (COLACE) 100 MG capsule Take 100 mg by mouth 2 (two) times daily as needed for mild constipation. (Patient not taking: Reported on 02/18/2023)   hyoscyamine (LEVBID) 0.375 MG 12 hr tablet Take 1 tablet (0.375 mg total) by mouth 2 (two) times daily. (Patient not taking: Reported on 02/18/2023)   ketoconazole (NIZORAL) 2 % cream Apply topically as needed.   Multiple Vitamins-Minerals (PRESERVISION AREDS 2) CAPS Take 1 capsule by mouth 2 (two) times a day.   triamcinolone cream (KENALOG) 0.1 % Apply topically 2 (two) times daily. (Patient not taking: Reported on 02/18/2023)   No current facility-administered medications for this visit. (Other)   REVIEW OF SYSTEMS: ROS   Positive for: Gastrointestinal, Musculoskeletal, Cardiovascular, Eyes Negative for: Constitutional, Neurological, Skin, Genitourinary, HENT, Endocrine, Respiratory, Psychiatric, Allergic/Imm, Heme/Lymph Last edited by Etheleen Mayhew, COT on 06/22/2023  8:39 AM.     ALLERGIES Allergies  Allergen Reactions   Lortab [Hydrocodone-Acetaminophen] Nausea And Vomiting   Latex Other (See Comments)    Pt unsure of reaction!   PAST MEDICAL HISTORY Past Medical History:  Diagnosis Date   Cataract    BILATERAL,REMOVED   Chronic diastolic CHF (congestive heart failure) (HCC) 01/13/2021   Exotropia of left eye    Fibromyalgia    GERD (gastroesophageal reflux disease)  History of uterine leiomyoma    Hyperlipidemia    Hypertension    Macular degeneration    OA (osteoarthritis)    Overactive bladder    Sarcoidosis    Sickle cell trait (HCC)    Wears glasses    Past Surgical History:  Procedure Laterality Date   ABDOMINAL HYSTERECTOMY  2013   ADJUSTABLE SUTURE MANIPULATION Left 04/10/2015   Procedure: ADJUSTABLE SUTURE MANIPULATION;  Surgeon: Aura Camps, MD;  Location:  [facility];  Service: Ophthalmology;  Laterality: Left;   CATARACT EXTRACTION Bilateral 05/2019   Dr. Nile Riggs   EYE SURGERY Bilateral 05/2019   Cat Sx - Dr. Nile Riggs   KNEE SURGERY Right    LAPROSCOPIC   MEDIAN RECTUS REPAIR Left 04/10/2015   Procedure: MEDIAN RECTUS RESECTION LATERAL RECTUS RECESSION,AJUSTABLES SUTURES LEFT EYE;  Surgeon: Aura Camps, MD;  Location: [facility];  Service: Ophthalmology;  Laterality: Left;   TONSILLECTOMY  as child   TOTAL ABDOMINAL HYSTERECTOMY W/ BILATERAL SALPINGOOPHORECTOMY  10/14/2010   and TVT midurethral sling and culdoplasty   TOTAL KNEE ARTHROPLASTY Left 06/15/2019   Procedure: TOTAL KNEE ARTHROPLASTY;  Surgeon: Durene Romans, MD;  Location: [facility];  Service: Orthopedics;  Laterality: Left;  70 mins   FAMILY HISTORY Family History  Problem Relation Age of Onset   Colon cancer Mother    Diabetes Father    Diabetes Sister    Hypertension Sister    Diabetes Sister    Hypertension Sister    Hypertension Sister    Colon polyps Neg Hx    Crohn's disease Neg Hx    Esophageal cancer Neg Hx    Rectal cancer Neg Hx    Stomach cancer Neg Hx    Ulcerative colitis Neg Hx    SOCIAL HISTORY Social History   Tobacco Use   Smoking status: Never   Smokeless tobacco: Never  Vaping Use   Vaping status: Never Used  Substance Use Topics   Alcohol use: Yes    Alcohol/week: 1.0 standard drink of alcohol    Types: 1 Glasses of wine per week    Comment: rare   Drug use: No       OPHTHALMIC EXAM:  Base Eye Exam     Visual Acuity (Snellen - Linear)       Right Left   Dist cc 20/30 HM   Dist ph cc NI NI    Correction: Glasses         Tonometry (Tonopen, 8:43 AM)       Right Left   Pressure 17 15         Pupils       Dark Light Shape React APD   Right 3 2 Round Brisk None   Left 3 2 Round Brisk None         Visual Fields       Left Right     Full   Restrictions Partial outer superior  temporal, inferior temporal, superior nasal, inferior nasal deficiencies          Extraocular Movement       Right Left    Full, Ortho Full, Ortho         Neuro/Psych     Oriented x3: Yes   Mood/Affect: Normal         Dilation     Both eyes: 2.5% Phenylephrine @ 8:43 AM           Slit Lamp and Fundus Exam  Slit Lamp Exam       Right Left   Lids/Lashes Dermatochalasis - upper lid Dermatochalasis - upper lid   Conjunctiva/Sclera mild Melanosis mild Melanosis   Cornea Arcus, Well healed temporal cataract wounds Arcus, linear scar at 0430, Well healed temporal cataract wounds, trace Punctate epithelial erosions   Anterior Chamber deep, clear, narrow angles deep and clear   Iris Round and reactive, focal atrophy at 1100 Round and moderately dilated, mild patches of atrophy   Lens PC IOL in good position, mild pigment on optic PC IOL in good position, mild pigment on optic, 1-2+ Posterior capsular opacification   Anterior Vitreous Vitreous syneresis, Posterior vitreous detachment, vitreous condensations Vitreous syneresis         Fundus Exam       Right Left   Disc Sharp rim, 2-3+ pallor, mild PPP Tilted disc, mild pallor, sharp rim, 360 PPA, +cupping   C/D Ratio 0.4 0.8   Macula large area of RPE and choroidal atrophy with small central foveal island spared; central island w/ pigment clumping, no heme large area of choroidal and RPE atrophy extending to nasal side of disc -- stable from prior, Pigment clumping, No heme   Vessels attenuated, mild tortuosity, severe peripheral attenuation attenuated, Tortuous   Periphery Attached, 0300 retinal break and +SRF / focal RD within bed of retinoschisis -- stable with good laser surrounding, no new RT/RD or schisis Attached, No heme           Refraction     Wearing Rx       Sphere Cylinder Axis Add   Right +0.25 +0.50 052 +2.50   Left +0.25 Sphere  +2.50    Type: PAL           IMAGING AND PROCEDURES   Imaging and Procedures for @TODAY @  OCT, Retina - OU - Both Eyes       Right Eye Quality was good. Central Foveal Thickness: 166. Progression has been stable. Findings include no IRF, no SRF, abnormal foveal contour, retinal drusen , inner retinal atrophy, outer retinal atrophy (diffuse macular atrophy with central island - shrinking, stable nasal schisis caught on widefield -- good laser surrounding).   Left Eye Quality was good. Central Foveal Thickness: 188. Progression has been stable. Findings include no IRF, no SRF, abnormal foveal contour, inner retinal atrophy, outer retinal atrophy (Diffuse ORA / central atrophy; no identifiable ellipsoid).   Notes *Images captured and stored on drive  Diagnosis / Impression:  Diffuse macular atrophy OU -- Stable from prior OD with central island of non-atrophied retina -- shrinking? Central areolar choroidal dystrophy OU Retinoschisis nasal periphery OD -- caught on widefield  Clinical management:  See below  Abbreviations: NFP - Normal foveal profile. CME - cystoid macular edema. PED - pigment epithelial detachment. IRF - intraretinal fluid. SRF - subretinal fluid. EZ - ellipsoid zone. ERM - epiretinal membrane. ORA - outer retinal atrophy. ORT - outer retinal tubulation. SRHM - subretinal hyper-reflective material             ASSESSMENT/PLAN:    ICD-10-CM   1. Right retinoschisis  H33.101 OCT, Retina - OU - Both Eyes    2. Retinal detachment, right  H33.21     3. Central areolar choroidal dystrophy  H31.22 OCT, Retina - OU - Both Eyes    4. Choroidal atrophy of both eyes  H31.103     5. Advanced atrophic nonexudative age-related macular degeneration of both eyes with subfoveal involvement  H35.3134  6. Pseudophakia of both eyes  Z96.1     7. Strabismus  H50.9       1,2. Localized, focal retinal detachment / SRF surrounding small retinal tear at 0330, within retinoschisis cavity, RIGHT EYE  - asymptomatic, but found  on exam 5.23.19  - tear located at 0330 OD  - S/P laser retinopexy OD (05.23.19), S/P touch up laser retinopexy OD (06.28.19) -- good laser in place  - BCVA: 20/40   - no change in SRF/schisis-detachment   - f/u in 1 year -- repeat widefield OCT over 0300 schisis cavity  3-5. Severe outer retinal and choroidal atrophy OU - stable today  - likely central areolar choroidal dystrophy -- pt notes declining vision that started in her 52-50s  - pt reports intermittent photopsias -- suspect may be related to progressive degeneration / dystrophy -- stably improved today  - differential also includes age related macular degeneration, non-exudative, both eyes  - The incidence, anatomy, and pathology of dry AMD, risk of progression, and the AREDS and AREDS 2 study including smoking risks discussed with patient.   - continue amsler grid monitoring   - f/u 1 year  - [facility]  6. Pseudophakia OU  - s/p CE/IOL OU Nile Riggs, June 2020)  - beautiful surgeries, doing well  - monitor   7. Strabismus  - under the expert management of Dr. Karleen Hampshire  - s/p EOM surgery 04/10/15  - monitor   Ophthalmic Meds Ordered this visit:  No orders of the defined types were placed in this encounter.    Return in about 1 year (around 06/21/2024) for f/u ORA / choroidal atrophy OU, DFE, OCT.  There are no Patient Instructions on file for this visit.  Explained the diagnoses, plan, and follow up with the patient and they expressed understanding.  Patient expressed understanding of the importance of proper follow up care.   This document serves as a record of services personally performed by Karie Chimera, MD, PhD. It was created on their behalf by Gerilyn Nestle, COT an ophthalmic technician. The creation of this record is the provider's dictation and/or activities during the visit.    Electronically signed by:  Charlette Caffey, COT  06/22/23 8:41 PM  This document serves as a  record of services personally performed by Karie Chimera, MD, PhD. It was created on their behalf by Glee Arvin. Manson Passey, OA an ophthalmic technician. The creation of this record is the provider's dictation and/or activities during the visit.    Electronically signed by: Glee Arvin. Manson Passey, OA 06/22/23 8:41 PM  Karie Chimera, M.D., Ph.D. Diseases & Surgery of the Retina and Vitreous [facility]  I have reviewed the above documentation for accuracy and completeness, and I agree with the above. Karie Chimera, M.D., Ph.D. 06/22/23 8:42 PM   Abbreviations: M myopia (nearsighted); A astigmatism; H hyperopia (farsighted); P presbyopia; Mrx spectacle prescription;  CTL contact lenses; OD right eye; OS left eye; OU both eyes  XT exotropia; ET esotropia; PEK punctate epithelial keratitis; PEE punctate epithelial erosions; DES dry eye syndrome; MGD meibomian gland dysfunction; ATs artificial tears; PFAT's preservative free artificial tears; NSC nuclear sclerotic cataract; PSC posterior subcapsular cataract; ERM epi-retinal membrane; PVD posterior vitreous detachment; RD retinal detachment; DM diabetes mellitus; DR diabetic retinopathy; NPDR non-proliferative diabetic retinopathy; PDR proliferative diabetic retinopathy; CSME clinically significant macular edema; DME diabetic macular edema; dbh dot blot hemorrhages; CWS cotton wool spot; POAG  primary open angle glaucoma; C/D cup-to-disc ratio; HVF humphrey visual field; GVF goldmann visual field; OCT optical coherence tomography; IOP intraocular pressure; BRVO Branch retinal vein occlusion; CRVO central retinal vein occlusion; CRAO central retinal artery occlusion; BRAO branch retinal artery occlusion; RT retinal tear; SB scleral buckle; PPV pars plana vitrectomy; VH Vitreous hemorrhage; PRP panretinal laser photocoagulation; IVK intravitreal kenalog; VMT vitreomacular traction; MH Macular hole;  NVD neovascularization of the disc; NVE  neovascularization elsewhere; AREDS age related eye disease study; ARMD age related macular degeneration; POAG primary open angle glaucoma; EBMD epithelial/anterior basement membrane dystrophy; ACIOL anterior chamber intraocular lens; IOL intraocular lens; PCIOL posterior chamber intraocular lens; Phaco/IOL phacoemulsification with intraocular lens placement; PRK photorefractive keratectomy; LASIK laser assisted in situ keratomileusis; HTN hypertension; DM diabetes mellitus; COPD chronic obstructive pulmonary disease

## 2023-06-22 ENCOUNTER — Encounter (INDEPENDENT_AMBULATORY_CARE_PROVIDER_SITE_OTHER): Payer: Self-pay | Admitting: Ophthalmology

## 2023-06-22 ENCOUNTER — Ambulatory Visit (INDEPENDENT_AMBULATORY_CARE_PROVIDER_SITE_OTHER): Payer: Medicare Other | Admitting: Ophthalmology

## 2023-06-22 DIAGNOSIS — H3321 Serous retinal detachment, right eye: Secondary | ICD-10-CM | POA: Diagnosis not present

## 2023-06-22 DIAGNOSIS — H33101 Unspecified retinoschisis, right eye: Secondary | ICD-10-CM | POA: Diagnosis not present

## 2023-06-22 DIAGNOSIS — Z961 Presence of intraocular lens: Secondary | ICD-10-CM

## 2023-06-22 DIAGNOSIS — H353134 Nonexudative age-related macular degeneration, bilateral, advanced atrophic with subfoveal involvement: Secondary | ICD-10-CM | POA: Diagnosis not present

## 2023-06-22 DIAGNOSIS — H31103 Choroidal degeneration, unspecified, bilateral: Secondary | ICD-10-CM | POA: Diagnosis not present

## 2023-06-22 DIAGNOSIS — H509 Unspecified strabismus: Secondary | ICD-10-CM

## 2023-06-22 DIAGNOSIS — H3122 Choroidal dystrophy (central areolar) (generalized) (peripapillary): Secondary | ICD-10-CM

## 2023-07-21 ENCOUNTER — Telehealth: Payer: Self-pay | Admitting: Family Medicine

## 2023-07-21 MED ORDER — CYCLOSPORINE 0.05 % OP EMUL: 1.0000 [drp] | Freq: Two times a day (BID) | OPHTHALMIC | 5 refills | Status: DC

## 2023-07-21 NOTE — Telephone Encounter (Signed)
Pt states her eye doctor Dr. Nile Riggs is no longer in business & would like you to now prescribe her Restasis that she takes for "dry eyes & degeneration".  Said she is going to make an appt with Holly Bodily new eye doctor.

## 2023-07-21 NOTE — Telephone Encounter (Signed)
Pt states she is in the donut hole & Restasis cost was $147 & now $407 & wanted to know if there were any options to help with the cost.  First told her to apply for LIS program phone number given & then if still nor affordable or denied can apply for [employer] Pt Assist, application printed.  Checked Good Rx cost $89 a month, Loraine Leriche [place] $136 a month.

## 2023-08-20 ENCOUNTER — Encounter: Payer: Self-pay | Admitting: Podiatrist

## 2023-08-20 ENCOUNTER — Ambulatory Visit: Payer: Medicare Other | Admitting: Podiatry

## 2023-08-20 ENCOUNTER — Ambulatory Visit: Payer: Medicare Other | Admitting: Podiatrist

## 2023-08-20 DIAGNOSIS — M79674 Pain in right toe(s): Secondary | ICD-10-CM

## 2023-08-20 DIAGNOSIS — B351 Tinea unguium: Secondary | ICD-10-CM | POA: Diagnosis not present

## 2023-08-20 DIAGNOSIS — M79675 Pain in left toe(s): Secondary | ICD-10-CM

## 2023-08-20 NOTE — Progress Notes (Signed)
Subjective: Chief Complaint  Patient presents with   Debridement    Trim toenails    72 y.o. returns the office today for painful, elongated, thickened toenails which she cannot trim herself. Denies any redness or drainage around the nails.  She has no other concerns today.  No recent injuries or changes otherwise.  PCP: Kathy Nian, MD Last Seen: December 23, 2022  Objective: AAO 3, NAD DP/PT pulses palpable, CRT less than 3 seconds Protective sensation intact with [name] monofilament Nails hypertrophic, dystrophic, elongated, brittle, discolored 10. There is tenderness overlying the nails 1-5 bilaterally. There is no surrounding erythema or drainage along the nail sites. No pain with calf compression, swelling, warmth, erythema.  Assessment: Patient presents with symptomatic onychomycosis  Plan: -Treatment options including alternatives, risks, complications were discussed -Nails sharply debrided 10 without complication/bleeding. -Discussed daily foot inspection. If there are any changes, to call the office immediately.  -Follow-up in 3 months or sooner if any problems are to arise. In the meantime, encouraged to call the office with any questions, concerns, changes symptoms.

## 2023-08-25 ENCOUNTER — Other Ambulatory Visit (INDEPENDENT_AMBULATORY_CARE_PROVIDER_SITE_OTHER): Payer: Medicare Other

## 2023-08-25 DIAGNOSIS — Z23 Encounter for immunization: Secondary | ICD-10-CM | POA: Diagnosis not present

## 2023-09-16 ENCOUNTER — Telehealth: Payer: Self-pay

## 2023-09-16 NOTE — Progress Notes (Addendum)
   09/16/2023  Patient ID: Kathy [name], female   DOB: 06-13-1951, 72 y.o.   MRN: [id number]  UPDATE: Contacted patient to remind her that paperwork is awaiting her signature. Had to leave a vm.  Contacted patient via telephone to f/u on Restasis cost concerns. Patient states she is still having trouble affording. Reviewed [employer] application and patient should qualify. States she was denied for LIS.  Printed out application and left at front office for patient pickup. Patient voiced understanding, declined to receive via mail.  Sherrill Raring, PharmD Clinical Pharmacist [phone]

## 2023-09-20 NOTE — Telephone Encounter (Signed)
I asked Pharmacist Marylene Land following up with pt

## 2023-11-18 ENCOUNTER — Ambulatory Visit: Payer: Medicare Other | Admitting: Podiatry

## 2023-11-18 ENCOUNTER — Encounter: Payer: Self-pay | Admitting: Podiatry

## 2023-11-18 DIAGNOSIS — B351 Tinea unguium: Secondary | ICD-10-CM | POA: Diagnosis not present

## 2023-11-18 DIAGNOSIS — M79675 Pain in left toe(s): Secondary | ICD-10-CM | POA: Diagnosis not present

## 2023-11-18 DIAGNOSIS — M79674 Pain in right toe(s): Secondary | ICD-10-CM | POA: Diagnosis not present

## 2023-11-18 NOTE — Progress Notes (Signed)
Subjective: Chief Complaint  Patient presents with   RFC    RM#13 RFC    72 y.o. returns the office today for painful, elongated, thickened toenails which she cannot trim herself. Denies any redness or drainage around the nails.  She has no other concerns today.  She remains some pain to the top of her left foot which seems to worsen with wearing her sneakers.  She typically wears flatter shoes.  No recent injuries or changes otherwise.  PCP: Ronnald Nian, MD Last Seen: December 23, 2022  Objective: AAO 3, NAD DP/PT pulses palpable, CRT less than 3 seconds Protective sensation intact with [name] monofilament Nails hypertrophic, dystrophic, elongated, brittle, discolored 10. There is tenderness overlying the nails 1-5 bilaterally. There is no surrounding erythema or drainage along the nail sites. Able to appreciate any area of tenderness however she does get subjective discomfort dorsal aspect of the midfoot at the level of the Lisfranc joint area/midfoot.  There is no area of tenderness identified there is no edema, erythema. Flatfoot No pain with calf compression, swelling, warmth, erythema.  Assessment: Patient presents with symptomatic onychomycosis; capsulitis  Plan: -Treatment options including alternatives, risks, complications were discussed -Nails sharply debrided 10 without complication/bleeding. -We discussed shoes, arch supports.  Discussed different inserts she can use inside of her shoes to help given her flatfoot. -Discussed daily foot inspection. If there are any changes, to call the office immediately.  -Follow-up in 3 months or sooner if any problems are to arise. In the meantime, encouraged to call the office with any questions, concerns, changes symptoms.  Vivi Barrack DPM

## 2023-11-18 NOTE — Patient Instructions (Signed)
For inserts I like Powersteps, Superfeet, Aetrex

## 2023-11-19 ENCOUNTER — Ambulatory Visit: Payer: Medicare Other | Admitting: Podiatry

## 2023-12-07 ENCOUNTER — Telehealth: Payer: Self-pay | Admitting: Family Medicine

## 2023-12-07 NOTE — Telephone Encounter (Signed)
 Pt states Restasis rx was sent in but she gets the individual packets not the bottle   90 day supply    [facility] [address]

## 2023-12-28 ENCOUNTER — Encounter: Payer: Self-pay | Admitting: Family Medicine

## 2023-12-28 ENCOUNTER — Ambulatory Visit (INDEPENDENT_AMBULATORY_CARE_PROVIDER_SITE_OTHER): Payer: Medicare Other | Admitting: Family Medicine

## 2023-12-28 VITALS — BP 132/82 | HR 112 | Ht 62.5 in | Wt 169.2 lb

## 2023-12-28 DIAGNOSIS — R011 Cardiac murmur, unspecified: Secondary | ICD-10-CM

## 2023-12-28 DIAGNOSIS — Z23 Encounter for immunization: Secondary | ICD-10-CM | POA: Diagnosis not present

## 2023-12-28 DIAGNOSIS — Z0000 Encounter for general adult medical examination without abnormal findings: Secondary | ICD-10-CM | POA: Diagnosis not present

## 2023-12-28 DIAGNOSIS — M797 Fibromyalgia: Secondary | ICD-10-CM | POA: Diagnosis not present

## 2023-12-28 DIAGNOSIS — I5032 Chronic diastolic (congestive) heart failure: Secondary | ICD-10-CM | POA: Diagnosis not present

## 2023-12-28 DIAGNOSIS — H3530 Unspecified macular degeneration: Secondary | ICD-10-CM

## 2023-12-28 DIAGNOSIS — I10 Essential (primary) hypertension: Secondary | ICD-10-CM

## 2023-12-28 DIAGNOSIS — N3281 Overactive bladder: Secondary | ICD-10-CM

## 2023-12-28 DIAGNOSIS — R748 Abnormal levels of other serum enzymes: Secondary | ICD-10-CM

## 2023-12-28 DIAGNOSIS — J301 Allergic rhinitis due to pollen: Secondary | ICD-10-CM

## 2023-12-28 DIAGNOSIS — I700 Atherosclerosis of aorta: Secondary | ICD-10-CM

## 2023-12-28 DIAGNOSIS — Z96652 Presence of left artificial knee joint: Secondary | ICD-10-CM

## 2023-12-28 DIAGNOSIS — E6609 Other obesity due to excess calories: Secondary | ICD-10-CM

## 2023-12-28 DIAGNOSIS — M1990 Unspecified osteoarthritis, unspecified site: Secondary | ICD-10-CM

## 2023-12-28 DIAGNOSIS — D860 Sarcoidosis of lung: Secondary | ICD-10-CM | POA: Diagnosis not present

## 2023-12-28 DIAGNOSIS — Z9849 Cataract extraction status, unspecified eye: Secondary | ICD-10-CM

## 2023-12-28 MED ORDER — AMLODIPINE BESYLATE 5 MG PO TABS: 5.0000 mg | ORAL_TABLET | Freq: Every day | ORAL | 3 refills | Status: DC

## 2023-12-28 MED ORDER — ROSUVASTATIN CALCIUM 20 MG PO TABS: 20.0000 mg | ORAL_TABLET | Freq: Every day | ORAL | 3 refills | Status: DC

## 2023-12-28 NOTE — Progress Notes (Signed)
Kathy [name] is a 73 y.o. female who presents for annual wellness visit and follow-up on chronic medical conditions.  She has no major complaints or concerns.  Her allergies seem to be under good control.  She does have difficulty with arthritis and uses Tylenol with good results.  She gets colonoscopies every 5 years.  She did have a mammogram this year.  She continues on amlodipine and is having no difficulty with that.  She does have a history of sarcoid but has had no difficulties with pulmonary symptoms and has not seen a pulmonologist in quite some time.  At this point she is comfortable holding off on that.  She has been on Lipitor in the past.  She has had no chest pain, shortness of breath, PND or DOE otherwise she has no particular concerns or complaints.  Immunizations and Health Maintenance Immunization History  Administered Date(s) Administered   Fluad Quad(high Dose 65+) 08/31/2019, 09/02/2020, 09/03/2021, 08/20/2022   Fluad Trivalent(High Dose 65+) 08/25/2023   Influenza Split 08/28/2011, 09/23/2012   Influenza, High Dose Seasonal PF 09/30/2016, 09/01/2017, 08/24/2018   Influenza,inj,Quad PF,6+ Mos 09/13/2013, 08/30/2014, 09/10/2015   PFIZER Comirnaty(Gray Top)Covid-19 Tri-Sucrose Vaccine 03/25/2021   PFIZER(Purple Top)SARS-COV-2 Vaccination 01/23/2020, 02/13/2020, 09/02/2020   PNEUMOCOCCAL CONJUGATE-20 12/28/2023   PPD Test 01/16/2021   Pfizer Covid-19 Vaccine Bivalent Booster 68yrs & up 09/03/2021   Pfizer(Comirnaty)Fall Seasonal Vaccine 12 years and older 09/23/2022, 08/25/2023   Pneumococcal Conjugate-13 08/30/2014   Pneumococcal Polysaccharide-23 02/03/2016   Tdap 02/03/2016   Zoster Recombinant(Shingrix) 09/16/2017   Zoster, Live 01/28/2012   Health Maintenance Due  Topic Date Due   Zoster Vaccines- Shingrix (2 of 2) 11/11/2017    Last Pap smear:hysterectomy Last mammogram:04/14/23 Last colonoscopy:4/24 Last DEXA:unknown/does not think she has ever had  one Dentist:12/20/2023 Ophtho:appt  01/2024 Exercise:exercise bike 3 days a week  Other doctors caring for patient include: Optho:Dr. Karleen [facility] @ [facility] Dentist: [facility] GI:Dr. Marina Goodell JWJ:[id number] Retina:Dr. Mar Daring: Dr.Olin    Advanced directives:yes Does Patient Have a Medical Advance Directive?: Yes Type of Advance Directive: Healthcare Power of Attorney, Living will, Out of facility DNR (pink MOST or yellow form) Does patient want to make changes to medical advance directive?: No - Patient declined Copy of Healthcare Power of Attorney in Chart?: No - copy requested  Depression screen:  See questionnaire below.     12/28/2023    1:49 PM 11/25/2022    3:26 PM 11/03/2022   10:08 AM 09/03/2021    8:52 AM 03/25/2021    3:52 PM  Depression screen PHQ 2/9  Decreased Interest 0 0 0 0 0  Down, Depressed, Hopeless 0 1 0 0 0  PHQ - 2 Score 0 1 0 0 0    Fall Risk Screen: see questionnaire below.    12/28/2023    1:48 PM 11/25/2022    3:26 PM 11/03/2022   10:07 AM 09/03/2021    8:51 AM 09/02/2020    9:32 AM  Fall Risk   Falls in the past year? 1 0 0 0 1  Number falls in past yr: 0 0 0 0 0  Comment June 2024-tripped over something at the house      Injury with Fall? 0  0 0 0  Risk for fall due to : No Fall Risks No Fall Risks No Fall Risks History of fall(s)   Follow up Falls evaluation completed Falls evaluation completed Falls evaluation completed Falls evaluation completed     ADL screen:  See questionnaire  below Functional Status Survey: Is the patient deaf or have difficulty hearing?: No Does the patient have difficulty seeing, even when wearing glasses/contacts?: Yes (macular degeneration) Does the patient have difficulty concentrating, remembering, or making decisions?: Yes (are related memory) Does the patient have difficulty walking or climbing stairs?: Yes (due to knee replacement) Does the patient have difficulty dressing or bathing?: No Does the patient  have difficulty doing errands alone such as visiting a doctor's office or shopping?: Yes (does not drive)   Review of Systems Constitutional: -, -unexpected weight change, -anorexia, -fatigue Allergy: -sneezing, -itching, -congestion Dermatology: denies changing moles, rash, lumps ENT: -runny nose, -ear pain, -sore throat,  Cardiology:  -chest pain, -palpitations, -orthopnea, Respiratory: -cough, -shortness of breath, -dyspnea on exertion, -wheezing,  Gastroenterology: -abdominal pain, -nausea, -vomiting, -diarrhea, -constipation, -dysphagia Hematology: -bleeding or bruising problems Musculoskeletal: -arthralgias, -myalgias, -joint swelling, -back pain, - Ophthalmology: -vision changes,  Urology: -dysuria, -difficulty urinating,  -urinary frequency, -urgency, incontinence Neurology: -, -numbness, , -memory loss, -falls, -dizziness    PHYSICAL EXAM:   General Appearance: Alert, cooperative, no distress, appears stated age Head: Normocephalic, without obvious abnormality, atraumatic Eyes: PERRL, conjunctiva/corneas clear, EOM's intact, Ears: Normal TM's and external ear canals Nose: Nares normal, mucosa normal, no drainage or sinus tenderness Throat: Lips, mucosa, and tongue normal; teeth and gums normal Neck: Supple, no lymphadenopathy;  thyroid:  no enlargement/tenderness/nodules; no carotid bruit or JVD Lungs: Clear to auscultation bilaterally without wheezes, rales or ronchi; respirations unlabored Heart: Regular rate and rhythm, S1 and S2 normal, grade 1/6 systolic murmur heard best along the right sternal border.   Abdomen: Soft, non-tender, nondistended, normoactive bowel sounds,  no masses, no hepatosplenomegaly Skin:  Skin color, texture, turgor normal, no rashes or lesions Lymph nodes: Cervical, supraclavicular, and axillary nodes normal Neurologic:  CNII-XII intact, normal strength, sensation and gait; reflexes 2+ and symmetric throughout Psych: Normal mood, affect,  hygiene and grooming. EKG read by me shows a slight tachycardia with some evidence of hypertrophy and questionable repolarization abnormality. ASSESSMENT/PLAN: Routine general medical examination at a health care facility  Seasonal allergic rhinitis due to pollen  Aortic atherosclerosis (HCC) - Plan: Lipid panel, rosuvastatin (CRESTOR) 20 MG tablet  Arthritis  Chronic diastolic CHF (congestive heart failure) (HCC)  Essential hypertension - Plan: CBC with Differential/Platelet, Comprehensive metabolic panel, amLODipine (NORVASC) 5 MG tablet  Fibromyalgia  OAB (overactive bladder)  Obesity due to excess calories with serious comorbidity in pediatric patient, unspecified BMI  Macular degeneration, unspecified laterality, unspecified type  S/P left TKA  Status post cataract extraction, unspecified laterality  Pulmonary sarcoidosis (HCC)  Need for vaccination against Streptococcus pneumoniae - Plan: Pneumococcal conjugate vaccine 20-valent (Prevnar 20)  Systolic murmur - Plan: EKG 12-Lead, ECHOCARDIOGRAM COMPLETE  Follow-up pending results of echocardiogram.  Discussed  yearly mammograms; at least 30 minutes of aerobic activity at least 5 days/week and weight-bearing exercise 2x/week;   Immunization recommendations discussed.  Colonoscopy recommendations reviewed   Medicare Attestation I have personally reviewed: The patient's medical and social history Their use of alcohol, tobacco or illicit drugs Their current medications and supplements The patient's functional ability including ADLs,fall risks, home safety risks, cognitive, and hearing and visual impairment Diet and physical activities Evidence for depression or mood disorders  The patient's weight, height, and BMI have been recorded in the chart.  I have made referrals, counseling, and provided education to the patient based on review of the above and I have provided the patient with a written personalized care plan  for  preventive services.     Sharlot Gowda, MD   12/28/2023

## 2023-12-29 ENCOUNTER — Other Ambulatory Visit: Payer: Self-pay

## 2023-12-29 DIAGNOSIS — R011 Cardiac murmur, unspecified: Secondary | ICD-10-CM

## 2023-12-29 LAB — COMPREHENSIVE METABOLIC PANEL
ALT: 65 [IU]/L — ABNORMAL HIGH (ref 0–32)
AST: 214 [IU]/L — ABNORMAL HIGH (ref 0–40)
Albumin: 4.6 g/dL (ref 3.8–4.8)
Alkaline Phosphatase: 192 [IU]/L — ABNORMAL HIGH (ref 44–121)
BUN/Creatinine Ratio: 10 — ABNORMAL LOW (ref 12–28)
BUN: 7 mg/dL — ABNORMAL LOW (ref 8–27)
Bilirubin Total: 1.6 mg/dL — ABNORMAL HIGH (ref 0.0–1.2)
CO2: 23 mmol/L (ref 20–29)
Calcium: 9.6 mg/dL (ref 8.7–10.3)
Chloride: 97 mmol/L (ref 96–106)
Creatinine, Ser: 0.7 mg/dL (ref 0.57–1.00)
Globulin, Total: 2.8 g/dL (ref 1.5–4.5)
Glucose: 90 mg/dL (ref 70–99)
Potassium: 4.1 mmol/L (ref 3.5–5.2)
Sodium: 141 mmol/L (ref 134–144)
Total Protein: 7.4 g/dL (ref 6.0–8.5)
eGFR: 92 mL/min/{1.73_m2} (ref >59)

## 2023-12-29 LAB — CBC WITH DIFFERENTIAL/PLATELET
Basophils Absolute: 0.1 10*3/uL (ref 0.0–0.2)
Basos: 2 %
EOS (ABSOLUTE): 0 10*3/uL (ref 0.0–0.4)
Eos: 1 %
Hematocrit: 46.3 % (ref 34.0–46.6)
Hemoglobin: 15.6 g/dL (ref 11.1–15.9)
Immature Grans (Abs): 0 10*3/uL (ref 0.0–0.1)
Immature Granulocytes: 0 %
Lymphocytes Absolute: 0.9 10*3/uL (ref 0.7–3.1)
Lymphs: 25 %
MCH: 33.9 pg — ABNORMAL HIGH (ref 26.6–33.0)
MCHC: 33.7 g/dL (ref 31.5–35.7)
MCV: 101 fL — ABNORMAL HIGH (ref 79–97)
Monocytes Absolute: 0.6 10*3/uL (ref 0.1–0.9)
Monocytes: 17 %
Neutrophils Absolute: 2 10*3/uL (ref 1.4–7.0)
Neutrophils: 55 %
Platelets: 182 10*3/uL (ref 150–450)
RBC: 4.6 x10E6/uL (ref 3.77–5.28)
RDW: 12.2 % (ref 11.7–15.4)
WBC: 3.6 10*3/uL (ref 3.4–10.8)

## 2023-12-29 LAB — LIPID PANEL
Chol/HDL Ratio: 1.8 {ratio} (ref 0.0–4.4)
Cholesterol, Total: 246 mg/dL — ABNORMAL HIGH (ref 100–199)
HDL: 136 mg/dL (ref >39)
LDL Chol Calc (NIH): 97 mg/dL (ref 0–99)
Triglycerides: 82 mg/dL (ref 0–149)
VLDL Cholesterol Cal: 13 mg/dL (ref 5–40)

## 2023-12-30 ENCOUNTER — Encounter: Payer: Self-pay | Admitting: Family Medicine

## 2023-12-30 NOTE — Addendum Note (Signed)
Addended by: Ronnald Nian on: 12/30/2023 11:44 AM   Modules accepted: Orders

## 2024-01-06 ENCOUNTER — Other Ambulatory Visit: Payer: Medicare Other

## 2024-01-06 DIAGNOSIS — R748 Abnormal levels of other serum enzymes: Secondary | ICD-10-CM

## 2024-01-07 LAB — ACUTE VIRAL HEPATITIS (HAV, HBV, HCV)
HCV Ab: NONREACTIVE
Hep A IgM: NEGATIVE
Hep B C IgM: NEGATIVE
Hepatitis B Surface Ag: NEGATIVE

## 2024-01-07 LAB — HCV INTERPRETATION

## 2024-01-21 DIAGNOSIS — H04129 Dry eye syndrome of unspecified lacrimal gland: Secondary | ICD-10-CM | POA: Diagnosis not present

## 2024-01-21 DIAGNOSIS — H3122 Choroidal dystrophy (central areolar) (generalized) (peripapillary): Secondary | ICD-10-CM | POA: Diagnosis not present

## 2024-01-21 DIAGNOSIS — H5440 Blindness, one eye, unspecified eye: Secondary | ICD-10-CM | POA: Diagnosis not present

## 2024-01-21 DIAGNOSIS — Z961 Presence of intraocular lens: Secondary | ICD-10-CM | POA: Diagnosis not present

## 2024-01-24 ENCOUNTER — Ambulatory Visit (HOSPITAL_COMMUNITY): Payer: Medicare Other | Attending: Cardiology

## 2024-01-24 DIAGNOSIS — R011 Cardiac murmur, unspecified: Secondary | ICD-10-CM

## 2024-01-24 LAB — ECHOCARDIOGRAM COMPLETE
Area-P 1/2: 5.54 cm2
S' Lateral: 2.25 cm

## 2024-01-25 ENCOUNTER — Encounter: Payer: Self-pay | Admitting: Internal Medicine

## 2024-02-17 ENCOUNTER — Encounter: Payer: Self-pay | Admitting: Podiatry

## 2024-02-17 ENCOUNTER — Ambulatory Visit: Payer: Medicare Other | Admitting: Podiatry

## 2024-02-17 DIAGNOSIS — B351 Tinea unguium: Secondary | ICD-10-CM | POA: Diagnosis not present

## 2024-02-17 DIAGNOSIS — M79675 Pain in left toe(s): Secondary | ICD-10-CM

## 2024-02-17 DIAGNOSIS — M79674 Pain in right toe(s): Secondary | ICD-10-CM

## 2024-02-17 MED ORDER — CICLOPIROX 8 % EX SOLN: Freq: Every day | CUTANEOUS | 2 refills | Status: AC

## 2024-02-17 NOTE — Progress Notes (Signed)
 Subjective: Chief Complaint  Patient presents with   RFC    RM#11 RFC patient has no concerns just states toe nails grow fast.    73 y.o. returns the office today for painful, elongated, thickened toenails which she cannot trim herself. Denies any redness or drainage around the nails.  She has no other concerns today.  Her son is asking about nail discoloration.   PCP: Ronnald Nian, MD Last Seen: December 23, 2022  Objective: AAO 3, NAD DP/PT pulses palpable, CRT less than 3 seconds Protective sensation intact with [name] monofilament Nails hypertrophic, dystrophic, elongated, brittle, discolored 10. There is tenderness overlying the nails 1-5 bilaterally. There is no surrounding erythema or drainage along the nail sites. Flatfoot No pain with calf compression, swelling, warmth, erythema.  Assessment: Patient presents with symptomatic onychomycosis; capsulitis  Plan: -Treatment options including alternatives, risks, complications were discussed -Nails sharply debrided 10 without complication/bleeding. -Penlac prescribed. Discussed length of use and success rates.  -Discussed daily foot inspection. If there are any changes, to call the office immediately.  -Follow-up in 3 months or sooner if any problems are to arise. In the meantime, encouraged to call the office with any questions, concerns, changes symptoms.  Vivi Barrack DPM

## 2024-04-19 DIAGNOSIS — Z1231 Encounter for screening mammogram for malignant neoplasm of breast: Secondary | ICD-10-CM | POA: Diagnosis not present

## 2024-04-19 LAB — HM MAMMOGRAPHY

## 2024-04-20 ENCOUNTER — Encounter: Payer: Self-pay | Admitting: Family Medicine

## 2024-05-08 ENCOUNTER — Telehealth: Payer: Self-pay

## 2024-05-08 NOTE — Telephone Encounter (Signed)
 Copied from CRM [id number]. Topic: General - Other >> May 08, 2024  4:07 PM Fonda T wrote: Reason for CRM: Antony Baumgartner from [facility], calling states she is missing diagnosis codes from Test ordered Acute Hepatitis from date of service 01/06/24  Antony Baumgartner can be reached at [phone]  Please call back with requested information.  Thank you!  Called [facility] they had no collection of the information I was giving them they had everything they needed told them to call back if needed.

## 2024-05-18 ENCOUNTER — Telehealth: Payer: Self-pay

## 2024-05-18 NOTE — Progress Notes (Signed)
   05/18/2024  Patient ID: Kathy [name], female   DOB: 1951-02-08, 73 y.o.   MRN: [id number]  Contacted patient to follow up on restasis  assistance application. Patient never had returned requested proof of income.   Patient reports she is actually no longer using the restasis  and eye doctor is having her use otc dry eye drops.  Carnell Christian, PharmD Clinical Pharmacist [phone]

## 2024-05-19 ENCOUNTER — Ambulatory Visit: Admitting: Podiatry

## 2024-05-19 DIAGNOSIS — M79674 Pain in right toe(s): Secondary | ICD-10-CM | POA: Diagnosis not present

## 2024-05-19 DIAGNOSIS — M792 Neuralgia and neuritis, unspecified: Secondary | ICD-10-CM

## 2024-05-19 DIAGNOSIS — M79675 Pain in left toe(s): Secondary | ICD-10-CM | POA: Diagnosis not present

## 2024-05-19 DIAGNOSIS — B351 Tinea unguium: Secondary | ICD-10-CM | POA: Diagnosis not present

## 2024-05-19 NOTE — Patient Instructions (Signed)
 For topical creams I would try Aspercreme with lidocaine , capsaicin cream or Voltaren  gel.

## 2024-05-19 NOTE — Progress Notes (Signed)
 Subjective: Chief Complaint  Patient presents with   RFC    RM#13 RFC     73 y.o. returns the office today for painful, elongated, thickened toenails which she cannot trim herself. Denies any redness or drainage around the nails.    States that she gets pain on the bottom of the big toes, today at nighttime.  No injuries.  PCP: Watson Hacking, MD Last Seen: December 23, 2022  Objective: AAO 3, NAD DP/PT pulses palpable, CRT less than 3 seconds Protective sensation intact with Darin Edinger monofilament Nails hypertrophic, dystrophic, elongated, brittle, discolored 10. There is tenderness overlying the nails 1-5 bilaterally. There is no surrounding erythema or drainage along the nail sites. Flatfoot Subjectively getting tenderness palpation of the plantar aspect of bilateral hallux.  There is no open lesions there is no area pinpoint tenderness there is no edema.  There is no erythema. No pain with calf compression, swelling, warmth, erythema.  Assessment: Patient presents with symptomatic onychomycosis; capsulitis/neuropathy  Plan: Symptomatic onychomycosis -Nails sharply debrided 10 without complication/bleeding.  Toe pain b/l hallux - This could be an early neuropathy or inflammation.  Discussed topical medication versus oral medic patient.  Will start with topical creams which I provided a list for today.  If no improvement will likely start gabapentin 100 mg.  Return in about 3 months (around 08/19/2024).  Charity Conch DPM

## 2024-06-19 NOTE — Progress Notes (Signed)
 [facility] - Clinic Note  06/20/2024     CHIEF COMPLAINT Patient presents for Retina Follow Up    HISTORY OF PRESENT ILLNESS: Kathy [name] is a 73 y.o. female who presents to the clinic today for:  HPI     Retina Follow Up   Patient presents with  Other.  In left eye.  This started 1 year ago.  I, the attending physician,  performed the HPI with the patient and updated documentation appropriately.        Comments   Patient here for 1 year retina follow up for chorodial atrophy OS/OD retinoschisis. Patient states vision doing alright. No eye pain.      Last edited by Valdemar Rogue, MD on 06/25/2024  2:14 AM.    Pt states she's doing alright vision wise. She was on Retstasis BID OU prescribed by Dr. Roz but it was too expensive. She's now on OTC gtt. Sees floaters sometimes.    Referring physician: Joyce Norleen BROCKS, MD [address] [place],  [place] [address]  HISTORICAL INFORMATION:   Selected notes from the MEDICAL RECORD NUMBER Referred by Dr. Ozell Mirza for concern of central areolar choroidal dystrophy LEE: 05.16.19 (M. Spencer) [BCVA: OD: 20/30-2 OS: CF] Ocular Hx-Central areolar choroidal dystrophy, Macular Degeneration, exotropia OS, cataracts OU, dry eyes PMH-HTN, Fibromyalgia    CURRENT MEDICATIONS: No current outpatient medications on file. (Ophthalmic Drugs)   No current facility-administered medications for this visit. (Ophthalmic Drugs)   Current Outpatient Medications (Other)  Medication Sig   acetaminophen  (TYLENOL ) 500 MG tablet Take 2 tablets (1,000 mg total) by mouth every 8 (eight) hours as needed for moderate pain.   amLODipine  (NORVASC ) 5 MG tablet Take 1 tablet (5 mg total) by mouth daily.   bismuth subsalicylate (PEPTO BISMOL) 262 MG chewable tablet Chew 524 mg by mouth as needed for diarrhea or loose stools.   ciclopirox  (PENLAC ) 8 % solution Apply topically at bedtime. Apply over nail and surrounding  skin. Apply daily over previous coat. After seven (7) days, may remove with alcohol and continue cycle.   ketoconazole (NIZORAL) 2 % cream Apply topically as needed.   Multiple Vitamins-Minerals (PRESERVISION AREDS 2) CAPS Take 1 capsule by mouth 2 (two) times a day.   rosuvastatin  (CRESTOR ) 20 MG tablet Take 1 tablet (20 mg total) by mouth daily.   triamcinolone  cream (KENALOG ) 0.1 % Apply topically 2 (two) times daily.   No current facility-administered medications for this visit. (Other)   REVIEW OF SYSTEMS: ROS   Positive for: Gastrointestinal, Musculoskeletal, Cardiovascular, Eyes Negative for: Constitutional, Neurological, Skin, Genitourinary, HENT, Endocrine, Respiratory, Psychiatric, Allergic/Imm, Heme/Lymph Last edited by Orval Asberry RAMAN, COA on 06/20/2024  9:16 AM.      ALLERGIES Allergies  Allergen Reactions   Lortab [Hydrocodone-Acetaminophen ] Nausea And Vomiting   Latex Other (See Comments)    Pt unsure of reaction!   PAST MEDICAL HISTORY Past Medical History:  Diagnosis Date   Cataract    BILATERAL,REMOVED   Chronic diastolic CHF (congestive heart failure) (HCC) 01/13/2021   Exotropia of left eye    Fibromyalgia    GERD (gastroesophageal reflux disease)    History of uterine leiomyoma    Hyperlipidemia    Hypertension    Macular degeneration    OA (osteoarthritis)    Overactive bladder    Sarcoidosis    Sickle cell trait (HCC)    Wears glasses    Past Surgical History:  Procedure Laterality Date   ABDOMINAL  HYSTERECTOMY  2013   ADJUSTABLE SUTURE MANIPULATION Left 04/10/2015   Procedure: ADJUSTABLE SUTURE MANIPULATION;  Surgeon: Ozell Mirza, MD;  Location: [facility];  Service: Ophthalmology;  Laterality: Left;   CATARACT EXTRACTION Bilateral 05/2019   Dr. Roz   EYE SURGERY Bilateral 05/2019   Cat Sx - Dr. Roz   KNEE SURGERY Right    LAPROSCOPIC   MEDIAN RECTUS REPAIR Left 04/10/2015   Procedure: MEDIAN RECTUS RESECTION  LATERAL RECTUS RECESSION,AJUSTABLES SUTURES LEFT EYE;  Surgeon: Ozell Mirza, MD;  Location: [facility];  Service: Ophthalmology;  Laterality: Left;   TONSILLECTOMY  as child   TOTAL ABDOMINAL HYSTERECTOMY W/ BILATERAL SALPINGOOPHORECTOMY  10/14/2010   and TVT midurethral sling and culdoplasty   TOTAL KNEE ARTHROPLASTY Left 06/15/2019   Procedure: TOTAL KNEE ARTHROPLASTY;  Surgeon: Ernie Cough, MD;  Location: [facility];  Service: Orthopedics;  Laterality: Left;  70 mins   FAMILY HISTORY Family History  Problem Relation Age of Onset   Colon cancer Mother    Diabetes Father    Diabetes Sister    Hypertension Sister    Diabetes Sister    Hypertension Sister    Hypertension Sister    Colon polyps Neg Hx    Crohn's disease Neg Hx    Esophageal cancer Neg Hx    Rectal cancer Neg Hx    Stomach cancer Neg Hx    Ulcerative colitis Neg Hx    SOCIAL HISTORY Social History   Tobacco Use   Smoking status: Never   Smokeless tobacco: Never  Vaping Use   Vaping status: Never Used  Substance Use Topics   Alcohol use: Yes    Alcohol/week: 1.0 standard drink of alcohol    Types: 1 Glasses of wine per week    Comment: rare   Drug use: No       OPHTHALMIC EXAM:  Base Eye Exam     Visual Acuity (Snellen - Linear)       Right Left   Dist cc 20/30 -2 CF at 2'    Correction: Glasses         Tonometry (Tonopen, 9:13 AM)       Right Left   Pressure 15 13         Pupils       Dark Light Shape React APD   Right 3 2 Round Brisk None   Left 3 2 Round Brisk None         Visual Fields (Counting fingers)       Left Right     Full   Restrictions Partial outer superior temporal, inferior temporal, superior nasal, inferior nasal deficiencies          Extraocular Movement       Right Left    Full, Ortho Full, Ortho         Neuro/Psych     Oriented x3: Yes   Mood/Affect: Normal         Dilation     Both eyes: 1.0% Mydriacyl, 2.5%  Phenylephrine  @ 9:13 AM           Slit Lamp and Fundus Exam     Slit Lamp Exam       Right Left   Lids/Lashes Dermatochalasis - upper lid Dermatochalasis - upper lid   Conjunctiva/Sclera mild Melanosis mild Melanosis   Cornea Arcus, Well healed temporal cataract wounds Arcus, linear scar at 0430, Well healed temporal cataract wounds, trace Punctate epithelial erosions   Anterior  Chamber deep, clear, narrow angles deep and clear   Iris Round and dilated, focal atrophy at 0900 and 1100 Round and moderately dilated, mild patches of atrophy   Lens PC IOL in good position PC IOL in good position, mild pigment on optic, 1+ Posterior capsular opacification   Anterior Vitreous Vitreous syneresis, Posterior vitreous detachment, vitreous condensations Vitreous syneresis         Fundus Exam       Right Left   Disc Sharp rim, 2-3+ pallor, mild PPP Tilted disc, mild pallor, sharp rim, 360 PPA, +cupping   C/D Ratio 0.4 0.8   Macula large area of RPE and choroidal atrophy with small central foveal island spared; central island w/ pigment clumping, no heme large area of choroidal and RPE atrophy extending to nasal side of disc -- stable from prior, Pigment clumping, No heme   Vessels attenuated, mild tortuosity, severe peripheral attenuation, AV crossing changes attenuated, Tortuous   Periphery Attached, 0300 retinal break and +SRF / focal RD within bed of retinoschisis -- stable with good laser surrounding, no new RT/RD or schisis Attached, No heme           Refraction     Wearing Rx       Sphere Cylinder Axis Add   Right -1.25 +1.25 086 +2.50   Left -2.00 +0.75 083 +2.50           IMAGING AND PROCEDURES  Imaging and Procedures for @TODAY @  OCT, Retina - OU - Both Eyes       Right Eye Quality was good. Central Foveal Thickness: 157. Progression has been stable. Findings include no IRF, no SRF, abnormal foveal contour, retinal drusen , inner retinal atrophy, outer retinal  atrophy (diffuse macular atrophy with central island - shrinking, stable nasal schisis caught on widefield -- not imaged today).   Left Eye Quality was good. Central Foveal Thickness: 173. Progression has been stable. Findings include no IRF, no SRF, abnormal foveal contour, inner retinal atrophy, outer retinal atrophy (Diffuse ORA / central atrophy; no identifiable ellipsoid).   Notes *Images captured and stored on drive  Diagnosis / Impression:  Diffuse macular atrophy OU -- Stable from prior OD with central island of non-atrophied retina -- shrinking? Central areolar choroidal dystrophy OU Retinoschisis nasal periphery OD -- not imaged today  Clinical management:  See below  Abbreviations: NFP - Normal foveal profile. CME - cystoid macular edema. PED - pigment epithelial detachment. IRF - intraretinal fluid. SRF - subretinal fluid. EZ - ellipsoid zone. ERM - epiretinal membrane. ORA - outer retinal atrophy. ORT - outer retinal tubulation. SRHM - subretinal hyper-reflective material            ASSESSMENT/PLAN:    ICD-10-CM   1. Right retinoschisis  H33.101 OCT, Retina - OU - Both Eyes    2. Retinal detachment, right  H33.21     3. Central areolar choroidal dystrophy  H31.22     4. Choroidal atrophy of both eyes  H31.103     5. Advanced atrophic nonexudative age-related macular degeneration of both eyes with subfoveal involvement  H35.3134     6. Pseudophakia of both eyes  Z96.1     7. Strabismus  H50.9        1,2. Localized, focal retinal detachment / SRF surrounding small retinal tear at 0330, within retinoschisis cavity, RIGHT EYE  - asymptomatic, but found on exam 5.23.19  - tear located at 0330 OD  - S/P laser retinopexy OD (05.23.19), S/P  touch up laser retinopexy OD (06.28.19) -- good laser in place  - BCVA: 20/40   - no change in SRF/schisis-detachment   - f/u in 1 year -- repeat widefield OCT over 0300 schisis cavity  3-5. Severe outer retinal and  choroidal atrophy OU - stable today  - likely central areolar choroidal dystrophy -- pt notes declining vision that started in her 27-50s  - pt reports intermittent photopsias -- suspect may be related to progressive degeneration / dystrophy -- photopsias stably improved  - differential also includes age related macular degeneration, non-exudative, both eyes  - The incidence, anatomy, and pathology of dry AMD, risk of progression, and the AREDS and AREDS 2 study including smoking risks discussed with patient.   - continue amsler grid monitoring   - f/u 1 year  - will refer to services for the blind  6. Pseudophakia OU  - s/p CE/IOL OU [place], June 2020)  - beautiful surgeries, doing well  - monitor  7. Strabismus  - under the expert management of Dr. Jacques  - s/p EOM surgery 04/10/15  - monitor  Ophthalmic Meds Ordered this visit:  No orders of the defined types were placed in this encounter.    Return in about 1 year (around 06/20/2025) for focal RD OD, DFE, OCT.  There are no Patient Instructions on file for this visit.  Explained the diagnoses, plan, and follow up with the patient and they expressed understanding.  Patient expressed understanding of the importance of proper follow up care.   This document serves as a record of services personally performed by Redell JUDITHANN Hans, MD, PhD. It was created on their behalf by Auston Muzzy, COMT. The creation of this record is the provider's dictation and/or activities during the visit.  Electronically signed by: Auston Muzzy, COMT 06/25/24 2:15 AM  This document serves as a record of services personally performed by Redell JUDITHANN Hans, MD, PhD. It was created on their behalf by Almetta Pesa, an ophthalmic technician. The creation of this record is the provider's dictation and/or activities during the visit.    Electronically signed by: Almetta Pesa, OA, 06/25/24  2:15 AM   Redell JUDITHANN Hans, M.D., Ph.D. Diseases & Surgery of  the Retina and Vitreous [facility]  I have reviewed the above documentation for accuracy and completeness, and I agree with the above. Redell JUDITHANN Hans, M.D., Ph.D. 06/25/24 2:16 AM   Abbreviations: M myopia (nearsighted); A astigmatism; H hyperopia (farsighted); P presbyopia; Mrx spectacle prescription;  CTL contact lenses; OD right eye; OS left eye; OU both eyes  XT exotropia; ET esotropia; PEK punctate epithelial keratitis; PEE punctate epithelial erosions; DES dry eye syndrome; MGD meibomian gland dysfunction; ATs artificial tears; PFAT's preservative free artificial tears; NSC nuclear sclerotic cataract; PSC posterior subcapsular cataract; ERM epi-retinal membrane; PVD posterior vitreous detachment; RD retinal detachment; DM diabetes mellitus; DR diabetic retinopathy; NPDR non-proliferative diabetic retinopathy; PDR proliferative diabetic retinopathy; CSME clinically significant macular edema; DME diabetic macular edema; dbh dot blot hemorrhages; CWS cotton wool spot; POAG primary open angle glaucoma; C/D cup-to-disc ratio; HVF humphrey visual field; GVF goldmann visual field; OCT optical coherence tomography; IOP intraocular pressure; BRVO Branch retinal vein occlusion; CRVO central retinal vein occlusion; CRAO central retinal artery occlusion; BRAO branch retinal artery occlusion; RT retinal tear; SB scleral buckle; PPV pars plana vitrectomy; VH Vitreous hemorrhage; PRP panretinal laser photocoagulation; IVK intravitreal kenalog ; VMT vitreomacular traction; MH Macular hole;  NVD neovascularization of the disc; NVE neovascularization  elsewhere; AREDS age related eye disease study; ARMD age related macular degeneration; POAG primary open angle glaucoma; EBMD epithelial/anterior basement membrane dystrophy; ACIOL anterior chamber intraocular lens; IOL intraocular lens; PCIOL posterior chamber intraocular lens; Phaco/IOL phacoemulsification with intraocular lens placement; PRK  photorefractive keratectomy; LASIK laser assisted in situ keratomileusis; HTN hypertension; DM diabetes mellitus; COPD chronic obstructive pulmonary disease

## 2024-06-20 ENCOUNTER — Ambulatory Visit (INDEPENDENT_AMBULATORY_CARE_PROVIDER_SITE_OTHER): Payer: Medicare Other | Admitting: Ophthalmology

## 2024-06-20 ENCOUNTER — Encounter (INDEPENDENT_AMBULATORY_CARE_PROVIDER_SITE_OTHER): Payer: Self-pay | Admitting: Ophthalmology

## 2024-06-20 DIAGNOSIS — H3321 Serous retinal detachment, right eye: Secondary | ICD-10-CM | POA: Diagnosis not present

## 2024-06-20 DIAGNOSIS — H3122 Choroidal dystrophy (central areolar) (generalized) (peripapillary): Secondary | ICD-10-CM

## 2024-06-20 DIAGNOSIS — H33101 Unspecified retinoschisis, right eye: Secondary | ICD-10-CM | POA: Diagnosis not present

## 2024-06-20 DIAGNOSIS — Z961 Presence of intraocular lens: Secondary | ICD-10-CM

## 2024-06-20 DIAGNOSIS — H31103 Choroidal degeneration, unspecified, bilateral: Secondary | ICD-10-CM | POA: Diagnosis not present

## 2024-06-20 DIAGNOSIS — H353134 Nonexudative age-related macular degeneration, bilateral, advanced atrophic with subfoveal involvement: Secondary | ICD-10-CM

## 2024-06-20 DIAGNOSIS — H509 Unspecified strabismus: Secondary | ICD-10-CM

## 2024-06-25 ENCOUNTER — Encounter (INDEPENDENT_AMBULATORY_CARE_PROVIDER_SITE_OTHER): Payer: Self-pay | Admitting: Ophthalmology

## 2024-07-09 ENCOUNTER — Emergency Department (HOSPITAL_COMMUNITY)

## 2024-07-09 ENCOUNTER — Other Ambulatory Visit: Payer: Self-pay

## 2024-07-09 ENCOUNTER — Encounter (HOSPITAL_COMMUNITY): Payer: Self-pay

## 2024-07-09 ENCOUNTER — Emergency Department (HOSPITAL_COMMUNITY)
Admission: EM | Admit: 2024-07-09 | Discharge: 2024-07-09 | Disposition: A | Discharge status: Home / Self Care | Attending: Emergency Medicine | Admitting: Emergency Medicine

## 2024-07-09 DIAGNOSIS — I509 Heart failure, unspecified: Secondary | ICD-10-CM | POA: Diagnosis not present

## 2024-07-09 DIAGNOSIS — Z743 Need for continuous supervision: Secondary | ICD-10-CM | POA: Diagnosis not present

## 2024-07-09 DIAGNOSIS — R7401 Elevation of levels of liver transaminase levels: Secondary | ICD-10-CM | POA: Diagnosis not present

## 2024-07-09 DIAGNOSIS — R109 Unspecified abdominal pain: Secondary | ICD-10-CM | POA: Diagnosis not present

## 2024-07-09 DIAGNOSIS — Z91040 Latex allergy status: Secondary | ICD-10-CM | POA: Diagnosis not present

## 2024-07-09 DIAGNOSIS — Z79899 Other long term (current) drug therapy: Secondary | ICD-10-CM | POA: Insufficient documentation

## 2024-07-09 DIAGNOSIS — N390 Urinary tract infection, site not specified: Secondary | ICD-10-CM | POA: Insufficient documentation

## 2024-07-09 DIAGNOSIS — R112 Nausea with vomiting, unspecified: Secondary | ICD-10-CM | POA: Insufficient documentation

## 2024-07-09 DIAGNOSIS — R197 Diarrhea, unspecified: Secondary | ICD-10-CM | POA: Diagnosis not present

## 2024-07-09 DIAGNOSIS — K8020 Calculus of gallbladder without cholecystitis without obstruction: Secondary | ICD-10-CM | POA: Diagnosis not present

## 2024-07-09 DIAGNOSIS — I110 Hypertensive heart disease with heart failure: Secondary | ICD-10-CM | POA: Diagnosis not present

## 2024-07-09 DIAGNOSIS — K5730 Diverticulosis of large intestine without perforation or abscess without bleeding: Secondary | ICD-10-CM | POA: Diagnosis not present

## 2024-07-09 DIAGNOSIS — R1084 Generalized abdominal pain: Secondary | ICD-10-CM | POA: Diagnosis present

## 2024-07-09 DIAGNOSIS — K760 Fatty (change of) liver, not elsewhere classified: Secondary | ICD-10-CM | POA: Diagnosis not present

## 2024-07-09 DIAGNOSIS — E876 Hypokalemia: Secondary | ICD-10-CM | POA: Diagnosis not present

## 2024-07-09 LAB — CBC
HCT: 43.9 % (ref 36.0–46.0)
Hemoglobin: 15 g/dL (ref 12.0–15.0)
MCH: 34.4 pg — ABNORMAL HIGH (ref 26.0–34.0)
MCHC: 34.2 g/dL (ref 30.0–36.0)
MCV: 100.7 fL — ABNORMAL HIGH (ref 80.0–100.0)
Platelets: 147 K/uL — ABNORMAL LOW (ref 150–400)
RBC: 4.36 MIL/uL (ref 3.87–5.11)
RDW: 11.5 % (ref 11.5–15.5)
WBC: 4.5 K/uL (ref 4.0–10.5)
nRBC: 0 % (ref 0.0–0.2)

## 2024-07-09 LAB — URINALYSIS, ROUTINE W REFLEX MICROSCOPIC
Bilirubin Urine: NEGATIVE
Glucose, UA: NEGATIVE mg/dL
Hgb urine dipstick: NEGATIVE
Ketones, ur: NEGATIVE mg/dL
Nitrite: NEGATIVE
Protein, ur: NEGATIVE mg/dL
Specific Gravity, Urine: 1.006 (ref 1.005–1.030)
pH: 7 (ref 5.0–8.0)

## 2024-07-09 LAB — COMPREHENSIVE METABOLIC PANEL WITH GFR
ALT: 38 U/L (ref 0–44)
AST: 100 U/L — ABNORMAL HIGH (ref 15–41)
Albumin: 3.6 g/dL (ref 3.5–5.0)
Alkaline Phosphatase: 161 U/L — ABNORMAL HIGH (ref 38–126)
Anion gap: 11 (ref 5–15)
BUN: 6 mg/dL — ABNORMAL LOW (ref 8–23)
CO2: 25 mmol/L (ref 22–32)
Calcium: 9.2 mg/dL (ref 8.9–10.3)
Chloride: 103 mmol/L (ref 98–111)
Creatinine, Ser: 0.46 mg/dL (ref 0.44–1.00)
GFR, Estimated: >60 mL/min (ref >60)
Glucose, Bld: 106 mg/dL — ABNORMAL HIGH (ref 70–99)
Potassium: 2.9 mmol/L — ABNORMAL LOW (ref 3.5–5.1)
Sodium: 139 mmol/L (ref 135–145)
Total Bilirubin: 3.6 mg/dL — ABNORMAL HIGH (ref 0.0–1.2)
Total Protein: 7.4 g/dL (ref 6.5–8.1)

## 2024-07-09 LAB — LIPASE, BLOOD: Lipase: 35 U/L (ref 11–51)

## 2024-07-09 MED ORDER — IOHEXOL 300 MG/ML  SOLN
100.0000 mL | Freq: Once | INTRAMUSCULAR | Status: AC | PRN
  Administered 2024-07-09: 100 mL via INTRAVENOUS

## 2024-07-09 MED ORDER — ONDANSETRON HCL 4 MG/2ML IJ SOLN
4.0000 mg | INTRAMUSCULAR | Status: AC | PRN
  Administered 2024-07-09: 4 mg via INTRAVENOUS
  Filled 2024-07-09: qty 2

## 2024-07-09 MED ORDER — POTASSIUM CHLORIDE ER 10 MEQ PO TBCR: 20.0000 meq | EXTENDED_RELEASE_TABLET | Freq: Every day | ORAL | 0 refills | Status: DC

## 2024-07-09 MED ORDER — LACTATED RINGERS IV BOLUS
1000.0000 mL | Freq: Once | INTRAVENOUS | Status: AC
  Administered 2024-07-09: 1000 mL via INTRAVENOUS

## 2024-07-09 MED ORDER — MORPHINE SULFATE (PF) 2 MG/ML IV SOLN
2.0000 mg | Freq: Once | INTRAVENOUS | Status: AC
  Administered 2024-07-09: 2 mg via INTRAVENOUS
  Filled 2024-07-09: qty 1

## 2024-07-09 MED ORDER — POTASSIUM CHLORIDE CRYS ER 20 MEQ PO TBCR
40.0000 meq | EXTENDED_RELEASE_TABLET | Freq: Once | ORAL | Status: AC
  Administered 2024-07-09: 40 meq via ORAL
  Filled 2024-07-09: qty 2

## 2024-07-09 MED ORDER — SULFAMETHOXAZOLE-TRIMETHOPRIM 800-160 MG PO TABS: 1.0000 | ORAL_TABLET | Freq: Two times a day (BID) | ORAL | 0 refills | Status: AC

## 2024-07-09 MED ORDER — POTASSIUM CHLORIDE 10 MEQ/100ML IV SOLN
10.0000 meq | Freq: Once | INTRAVENOUS | Status: AC
  Administered 2024-07-09: 10 meq via INTRAVENOUS
  Filled 2024-07-09: qty 100

## 2024-07-09 MED ORDER — ONDANSETRON 4 MG PO TBDP: 4.0000 mg | ORAL_TABLET | Freq: Three times a day (TID) | ORAL | 0 refills | Status: DC | PRN

## 2024-07-09 MED ORDER — SODIUM CHLORIDE 0.9 % IV SOLN
1.0000 g | Freq: Once | INTRAVENOUS | Status: AC
  Administered 2024-07-09: 1 g via INTRAVENOUS
  Filled 2024-07-09: qty 10

## 2024-07-09 NOTE — ED Provider Notes (Signed)
 [place] EMERGENCY DEPARTMENT AT [facility] Provider Note   CSN: [id number] Arrival date & time: 07/09/24  1507     Patient presents with: Abdominal Pain, Diarrhea, Nausea, and Emesis   Kathy [name] is a 73 y.o. female with past medical history of pulmonary sarcoidosis, HTN, OAB, fibromyalgia, CHF, left TKA presents emergency department for evaluation of intermittent sharp generalized abdominal pain when having vomiting and diarrhea.  Endorses 3-4 episodes of vomiting and diarrhea per day since Friday 2 days ago.  Reports that she vomits every time she has diarrhea.  Has had difficulty keeping food and fluids down secondary to vomiting.  Last BM was 2 hours ago.  Also has associated positional dizziness described as room spinning since having NVD.  Denies recent travel, known sick contacts, suspicious foods, fever, recent antibiotic use, blood in stool, hematochezia, urinary symptoms     Abdominal Pain Associated symptoms: diarrhea and vomiting   Diarrhea Associated symptoms: abdominal pain and vomiting   Emesis Associated symptoms: abdominal pain and diarrhea        Prior to Admission medications   Medication Sig Start Date End Date Taking? Authorizing Provider  ondansetron  (ZOFRAN -ODT) 4 MG disintegrating tablet Take 1 tablet (4 mg total) by mouth every 8 (eight) hours as needed for nausea or vomiting. 07/09/24  Yes Minnie Tinnie BRAVO, PA  potassium chloride  (KLOR-CON ) 10 MEQ tablet Take 2 tablets (20 mEq total) by mouth daily for 5 days. 07/09/24 07/14/24 Yes Minnie Tinnie BRAVO, PA  sulfamethoxazole -trimethoprim  (BACTRIM  DS) 800-160 MG tablet Take 1 tablet by mouth 2 (two) times daily for 7 days. 07/09/24 07/16/24 Yes Minnie Tinnie BRAVO, PA  acetaminophen  (TYLENOL ) 500 MG tablet Take 2 tablets (1,000 mg total) by mouth every 8 (eight) hours as needed for moderate pain. 11/26/20   Akula, Vijaya, MD  amLODipine  (NORVASC ) 5 MG tablet Take 1 tablet (5 mg total) by mouth daily.  12/28/23   Lalonde, John C, MD  bismuth subsalicylate (PEPTO BISMOL) 262 MG chewable tablet Chew 524 mg by mouth as needed for diarrhea or loose stools.    [provider]  ciclopirox  (PENLAC ) 8 % solution Apply topically at bedtime. Apply over nail and surrounding skin. Apply daily over previous coat. After seven (7) days, may remove with alcohol and continue cycle. 02/17/24   Gershon Donnice SAUNDERS, DPM  ketoconazole (NIZORAL) 2 % cream Apply topically as needed. 05/06/21   [provider]  Multiple Vitamins-Minerals (PRESERVISION AREDS 2) CAPS Take 1 capsule by mouth 2 (two) times a day.    [provider]  rosuvastatin  (CRESTOR ) 20 MG tablet Take 1 tablet (20 mg total) by mouth daily. 12/28/23   Joyce Norleen BROCKS, MD  triamcinolone  cream (KENALOG ) 0.1 % Apply topically 2 (two) times daily. 05/29/21   [provider]    Allergies: Lortab [hydrocodone-acetaminophen ] and Latex    Review of Systems  Gastrointestinal:  Positive for abdominal pain, diarrhea and vomiting.    Updated Vital Signs BP (!) 142/84   Pulse 92   Temp 98.1 F (36.7 C) (Oral)   Resp 18   Ht 5' 2.5 (1.588 m)   Wt 72.6 kg   SpO2 98%   BMI 28.80 kg/m   Physical Exam Vitals and nursing note reviewed.  Constitutional:      General: She is not in acute distress.    Appearance: Normal appearance.  HENT:     Head: Normocephalic and atraumatic.  Eyes:     General: Lids are normal.  Vision grossly intact. No visual field deficit.    Extraocular Movements:     Right eye: Normal extraocular motion and no nystagmus.     Left eye: Normal extraocular motion and no nystagmus.     Conjunctiva/sclera: Conjunctivae normal.  Cardiovascular:     Rate and Rhythm: Normal rate.  Pulmonary:     Effort: Pulmonary effort is normal. No respiratory distress.  Abdominal:     Tenderness: There is generalized abdominal tenderness. There is no right CVA tenderness, left CVA tenderness, guarding or rebound.      Comments: Very mild TTP with deep palpation.  Nonsurgical abdomen with no peritoneal signs  Musculoskeletal:     Cervical back: Normal range of motion and neck supple. No rigidity.  Skin:    Coloration: Skin is not jaundiced or pale.  Neurological:     Mental Status: She is alert and oriented to person, place, and time. Mental status is at baseline.     GCS: GCS eye subscore is 4. GCS verbal subscore is 5. GCS motor subscore is 6.     Cranial Nerves: No cranial nerve deficit, dysarthria or facial asymmetry.     Sensory: Sensation is intact. No sensory deficit.     Motor: No weakness, tremor, atrophy, abnormal muscle tone, seizure activity or pronator drift.     Coordination: Coordination normal. Finger-Nose-Finger Test and Heel to [place] Test normal.     Gait: Gait is intact.     Comments: No dizziness at rest. Grip strength equal bilaterally     (all labs ordered are listed, but only abnormal results are displayed) Labs Reviewed  URINALYSIS, ROUTINE W REFLEX MICROSCOPIC - Abnormal; Notable for the following components:      Result Value   APPearance HAZY (*)    Leukocytes,Ua MODERATE (*)    Bacteria, UA MANY (*)    All other components within normal limits  COMPREHENSIVE METABOLIC PANEL WITH GFR - Abnormal; Notable for the following components:   Potassium 2.9 (*)    Glucose, Bld 106 (*)    BUN 6 (*)    AST 100 (*)    Alkaline Phosphatase 161 (*)    Total Bilirubin 3.6 (*)    All other components within normal limits  CBC - Abnormal; Notable for the following components:   MCV 100.7 (*)    MCH 34.4 (*)    Platelets 147 (*)    All other components within normal limits  URINE CULTURE  LIPASE, BLOOD    EKG: EKG Interpretation Date/Time:  Sunday July 09 2024 18:23:02 EDT Ventricular Rate:  94 PR Interval:  180 QRS Duration:  88 QT Interval:  392 QTC Calculation: 491 R Axis:   16  Text Interpretation: Sinus rhythm Borderline prolonged QT interval Confirmed by Randol Simmonds [phone]) on 07/09/2024 6:26:53 PM  Radiology: US  Abdomen Limited RUQ (LIVER/GB) Result Date: 07/09/2024 CLINICAL DATA:  Rib quadrant pain for 2 days EXAM: ULTRASOUND ABDOMEN LIMITED RIGHT UPPER QUADRANT COMPARISON:  None Available. FINDINGS: Gallbladder: Gallbladder is well distended. No wall thickening or pericholecystic fluid is noted. Multiple small dependent gallstones are noted. Common bile duct: Diameter: 6 mm. Liver: Mildly increased in echogenicity consistent with fatty infiltration. No focal mass is noted. Portal vein is patent on color Doppler imaging with normal direction of blood flow towards the liver. Other: None. IMPRESSION: Cholelithiasis without complicating factors. Fatty liver. Electronically Signed   By: Oneil Devonshire M.D.   On: 07/09/2024 19:30   CT ABDOMEN PELVIS W CONTRAST  Result Date: 07/09/2024 CLINICAL DATA:  Abdomen pain with nausea vomiting and diarrhea EXAM: CT ABDOMEN AND PELVIS WITH CONTRAST TECHNIQUE: Multidetector CT imaging of the abdomen and pelvis was performed using the standard protocol following bolus administration of intravenous contrast. RADIATION DOSE REDUCTION: This exam was performed according to the departmental dose-optimization program which includes automated exposure control, adjustment of the mA and/or kV according to patient size and/or use of iterative reconstruction technique. CONTRAST:  OMNIPAQUE  IOHEXOL  300 MG/ML  SOLN COMPARISON:  CT 11/22/2020 FINDINGS: Lower chest: Clear lung bases.  Cardiomegaly Hepatobiliary: Hepatic steatosis. Hyperdense sludge or stones in the gallbladder. No biliary dilatation Pancreas: No inflammatory changes.  Slight prominence of the duct Spleen: Normal in size without focal abnormality. Adrenals/Urinary Tract: Adrenal glands are unremarkable. Kidneys are normal, without renal calculi, focal lesion, or hydronephrosis. Bladder is unremarkable. Stomach/Bowel: Stomach shows possible thickening at the antrum, series 2,  image 26. No dilated small bowel. Negative appendix. Scattered diverticula of the colon. No acute bowel wall thickening. Vascular/Lymphatic: Aortic atherosclerosis. No enlarged abdominal or pelvic lymph nodes. Reproductive: Status post hysterectomy. No adnexal masses. Other: Negative for pelvic effusion or free air. Musculoskeletal: No acute or suspicious osseous abnormality IMPRESSION: 1. No CT evidence for acute intra-abdominal or pelvic abnormality. 2. Hepatic steatosis. 3. Hyperdense sludge or stones in the gallbladder. 4. Thickened appearance of the gastric antrum, question due to gastritis or peptic ulcer disease. Correlation with endoscopy as indicated 5. Aortic atherosclerosis. Aortic Atherosclerosis (ICD10-I70.0). Electronically Signed   By: Luke Bun M.D.   On: 07/09/2024 18:21     Medications Ordered in the ED  morphine  (PF) 2 MG/ML injection 2 mg (2 mg Intravenous Given 07/09/24 1715)  ondansetron  (ZOFRAN ) injection 4 mg (4 mg Intravenous Given 07/09/24 1715)  lactated ringers  bolus 1,000 mL (0 mLs Intravenous Stopped 07/09/24 1837)  cefTRIAXone  (ROCEPHIN ) 1 g in sodium chloride  0.9 % 100 mL IVPB (0 g Intravenous Stopped 07/09/24 1906)  iohexol  (OMNIPAQUE ) 300 MG/ML solution 100 mL (100 mLs Intravenous Contrast Given 07/09/24 1755)  potassium chloride  10 mEq in 100 mL IVPB (0 mEq Intravenous Stopped 07/09/24 1935)  potassium chloride  SA (KLOR-CON  M) CR tablet 40 mEq (40 mEq Oral Given 07/09/24 1833)                                    Medical Decision Making Amount and/or Complexity of Data Reviewed Labs: ordered. Radiology: ordered.  Risk Prescription drug management.   Patient presents to the ED for concern of abdominal pain, NVD, this involves an extensive number of treatment options, and is a complaint that carries with it a high risk of complications and morbidity.  The differential diagnosis includes gastroenteritis, pancreatitis, appendicitis, colitis, obstruction,  perforation, infection, food poisoning, recent antibiotic use, rhabdo, electrolyte abnormalities.  Not exhaustive list   Co morbidities that complicate the patient evaluation  See HPI   Additional history obtained:  Additional history obtained from Nursing   External records from outside source obtained and reviewed including triage note, family bedside   Lab Tests:  I Ordered, and personally interpreted labs.  The pertinent results include:   UA with signs of infection, leukocytes, WBC, many bacteria Culture pending Potassium 2.9 AST 100 ALP 161 Total bilirubin 3.6   Imaging Studies ordered:  I ordered imaging studies including CT abd pelvis  I independently visualized and interpreted imaging which showed  No CT evidence for acute intra-abdominal  or pelvic abnormality. Hepatic steatosis. Hyperdense sludge or stones in the gallbladder. Thickened appearance of the gastric antrum, question due to gastritis or peptic ulcer disease.  Aortic atherosclerosis I agree with the radiologist interpretation   Cardiac Monitoring:  The patient was maintained on a cardiac monitor.  I personally viewed and interpreted the cardiac monitored which showed an underlying rhythm of: NSR at 94 beats BPM with no ST nor T wave abnormalities   Medicines ordered and prescription drug management:  I ordered medication including potassium, rocephin , IVF, zofran , morphine   for hypokalemia, UTI, hydration, nausea, pain Reevaluation of the patient after these medicines showed that the patient improved I have reviewed the patients home medicines and have made adjustments as needed     Problem List / ED Course:  Abd pain NVD Vital signs hemodynamically stable with no tachycardia nor fever Very mild generalized abdominal tenderness on exam.  Nonsurgical abdomen. Will obtain labs, CT imaging Will provide morphine  for analgesia and IVF for hydration in setting of vomiting and diarrhea.  Echo  from 2025 shows LVEF 65 to 70% so have low concern for fluid overload.  Last creatinine was WNL CT negative for emergent cause of symptoms. May be due to gastroenteritis vs current UTI Passed PO challenge and reports improvement of pain, nausea following analgesia and zofran  Did report some positional dizziness.  This improved following Rocephin , IVF. Thinking dizziness is 2/2 infection, mild dehydration. No dizziness at rest.  Low suspicion for CVA/TIA.  Otherwise neurologically intact. Ambulates wo difficulty. However did discuss to return to ED if dizziness occurs at rest or worsens Will provide Zofran  prescription Discussed strict return precautions to include intractable pain or vomiting causing severe dehydration  UTI UA appears infected with no contamination Previous UA in 11/2022 appeared similarly and that culture grew Klebsiella. Was sensitive for rocephin , bactrim  Will provide 1 dose of Rocephin  here Emergency Department and p.o. bactrim  for UTI as it has been susceptible for this in past.  Discussed antibiotic stewardship Will also send urine culture to ensure appropriate antibiotic for bacteria  Hypokalemia 2.9 Provided K 10mg  IV and PO 40mg  EKG without flat T waves Will provide short course of potassium prescription Recommended pt to f/u with PCP regarding trending potassium this week   Reevaluation:  After the interventions noted above, I reevaluated the patient and found that they have :improved    Dispostion:  After consideration of the diagnostic results and the patients response to treatment, I feel that the patent would benefit from outpatient management PCP follow-up this week.   Discussed ED workup, disposition, return to ED precautions with patient who expresses understanding agrees with plan.  All questions answered to their satisfaction.  They are agreeable to plan.  Discharge instructions provided on paperwork  Final diagnoses:  Urinary tract infection  without hematuria, site unspecified  Nausea vomiting and diarrhea  Generalized abdominal pain  Hypokalemia    ED Discharge Orders          Ordered    potassium chloride  (KLOR-CON ) 10 MEQ tablet  Daily        07/09/24 1938    ondansetron  (ZOFRAN -ODT) 4 MG disintegrating tablet  Every 8 hours PRN        07/09/24 1938    sulfamethoxazole -trimethoprim  (BACTRIM  DS) 800-160 MG tablet  2 times daily        07/09/24 1938             Minnie Tinnie BRAVO, PA 07/10/24 1633    Randol,  Thom, MD 07/11/24 [phone]

## 2024-07-09 NOTE — ED Triage Notes (Addendum)
 Pt BIBA from home, c/o n/v/d for the past 2 days. Sharp pain in the abdominal. Stool was a dark color, denies coffee ground emesis or blood in vomit. Pt stated she felt dizzy and felt she was about to pass out so she called EMS. A&Ox4  BP 150/96 CBG 136

## 2024-07-09 NOTE — ED Notes (Signed)
Pt able to tolerate PO challenge

## 2024-07-09 NOTE — ED Notes (Signed)
 IV attempt x 2 unsuccessful.

## 2024-07-09 NOTE — Discharge Instructions (Addendum)
 Thank you for letting us  evaluate you today.  It appears that you have a UTI.  We have given you a dose of antibiotics here in emergency department as well as some fluids.  I will send you home with some oral antibiotics for UTI.  We have also sent a urine culture and we will update you if you need to change antibiotic.  Your potassium was also low.  We have provided you with supplementation here Emergency Department.  I have also sent you home with a couple pills for supplementation.  Please follow-up with PCP within the next week to get potassium rechecked  Your gallbladder enzyme was mildly elevated.  Fortunately, CT and ultrasound did not show any signs of acute infection or inflammation of the gallbladder.  Please follow-up with PCP regarding this  Please make sure to keep oral hydration up with water , Pedialyte, Gatorade, chicken broth.  If you do not eat that is okay as long as you are drinking water .  I have sent home some Zofran  to use as needed for nausea, vomiting.  Return to Emergency Department if you experience intractable vomiting and inability to keep water  down, significant worsening abdominal pain, worsening symptoms.  Otherwise you can follow-up with PCP.

## 2024-07-12 LAB — URINE CULTURE: Culture: >=100000 — AB

## 2024-07-13 ENCOUNTER — Telehealth (HOSPITAL_BASED_OUTPATIENT_CLINIC_OR_DEPARTMENT_OTHER): Payer: Self-pay | Admitting: *Deleted

## 2024-07-13 NOTE — Telephone Encounter (Signed)
 Post ED Visit - Positive Culture Follow-up  Culture report reviewed by antimicrobial stewardship pharmacist: [facility] []  Rankin Dee, Pharm.D. []  Venetia Gully, Pharm.D., BCPS AQ-ID []  Garrel Crews, Pharm.D., BCPS []  Almarie Lunger, Pharm.D., BCPS []  [place], [address].D., BCPS, AAHIVP []  Rosaline Bihari, Pharm.D., BCPS, AAHIVP []  Vernell Meier, PharmD, BCPS []  Latanya Hint, PharmD, BCPS []  Donald Medley, PharmD, BCPS []  Rocky Bold, PharmD []  Dorothyann Alert, PharmD, BCPS []  Morene Babe, PharmD  [facility] [x]  Izetta Carl, PharmD []  Romona Bliss, PharmD []  Dolphus Roller, PharmD []  Veva Seip, Rph []  Vernell Daunt) Leonce, PharmD []  Eva Allis, PharmD []  Rosaline Millet, PharmD []  Iantha Batch, PharmD []  Arvin Gauss, PharmD []  Wanda Hasting, PharmD []  Ronal Rav, PharmD []  Rocky Slade, PharmD []  Bard Jeans, PharmD   Positive urine culture Treated with sulfamethoxazole - trimethoprim , organism sensitive to the same and no further patient follow-up is required at this time.  Lorita Barnie Pereyra 07/13/2024, 10:45 AM

## 2024-07-20 ENCOUNTER — Ambulatory Visit: Admitting: Family Medicine

## 2024-07-20 ENCOUNTER — Telehealth: Payer: Self-pay

## 2024-07-20 ENCOUNTER — Encounter: Payer: Self-pay | Admitting: Family Medicine

## 2024-07-20 VITALS — BP 128/70 | HR 71 | Ht 62.0 in | Wt 167.2 lb

## 2024-07-20 DIAGNOSIS — H04123 Dry eye syndrome of bilateral lacrimal glands: Secondary | ICD-10-CM

## 2024-07-20 DIAGNOSIS — I700 Atherosclerosis of aorta: Secondary | ICD-10-CM | POA: Diagnosis not present

## 2024-07-20 DIAGNOSIS — K8020 Calculus of gallbladder without cholecystitis without obstruction: Secondary | ICD-10-CM

## 2024-07-20 DIAGNOSIS — E876 Hypokalemia: Secondary | ICD-10-CM

## 2024-07-20 NOTE — Progress Notes (Signed)
   Subjective:    Patient ID: Kathy [name], female    DOB: 1951-02-03, 73 y.o.   MRN: [id number]  Discussed the use of AI scribe software for clinical note transcription with the patient, who gave verbal consent to proceed.  History of Present Illness   Kathy [name] is a 73 year old female who presents for follow-up after an emergency room visit for abdominal pain, nausea, and vomiting.  She visited the emergency room on Sunday, August 10th, due to intermittent and sharp generalized abdominal pain, nausea, vomiting, and diarrhea. She was diagnosed with a urinary tract infection and prescribed Bactrim . A CT scan revealed gallbladder stones, but her abdominal pain was generalized and not specifically in the right upper quadrant.  She has not experienced any urinary symptoms such as burning or stinging.. She experienced significant diarrhea, stating she 'couldn't stay out of the bathroom,' but her bowel movements have since returned to normal. She also reports feeling 'woozy' at times.  Her potassium levels were noted to be low during the ER visit. She has a history of aortic atherosclerosis and was previously prescribed Crestor  for cholesterol management in January, but she has not been taking it regularly. She takes her blood pressure medication regularly.  She is concerned about obtaining Restasis  for her dry eyes, as she received mixed messages about whether she could get it for free. She was informed by [employer] that she could get it through mail order with [employer], but she is unsure about the cost.           Review of Systems     Objective:    Physical Exam Physical Exam    Alert and in no distress.  Abdominal exam shows some slight right upper quadrant tenderness but no rebound or referred pain.  Negative Murphy sign and Murphy's punch.  Cardiac exam normal.      Emergency room record including lab and x-rays was evaluated.     Assessment & Plan:  Assessment  and Plan    Urinary tract infection Recent diagnosis treated with Bactrim . No current urinary symptoms.  Generalized abdominal pain Intermittent sharp pain. Abdominal CT showed incidental cholelithiasis. Possible gastritis considered.  Hypokalemia Low potassium levels likely secondary to diarrhea. Requires monitoring. - Order blood work to recheck potassium levels.  Aortic atherosclerosis Non-adherence to Crestor  for cholesterol management. - Renew prescription for Crestor . - Encourage adherence to Crestor .  Cholelithiasis Incidental finding on imaging.  Dry eye syndrome Concerns about obtaining Restasis . Mixed messages regarding eligibility for free medication. - Consult with pharmacist regarding Restasis  availability and cost options.     At this point she is essentially back to normal do need to follow-up with the blood work since she did have low potassium levels.  I also encouraged her to make sure that she stays on the cholesterol medicine and explained this is because of aortic atherosclerosis.

## 2024-07-20 NOTE — Progress Notes (Signed)
 Care Guide Pharmacy Note  07/20/2024 Name: [name] MRN: [id number] DOB: 06/22/1951  Referred By: Joyce Norleen BROCKS, MD Reason for referral: Complex Care Management and Call Attempt #1 (Initial outreach to schedule with PHARM D- Angela-Pt already saw Jon in office today while at Dr. Yvone office on 07/20/24)   Kathy [name] is a 73 y.o. year old female who is a primary care patient of Joyce Norleen BROCKS, MD.  Kathy [name] was referred to the pharmacist for assistance related to: medication assistance  Successful contact was made with the patient to discuss pharmacy services including being ready for the pharmacist to call at least 5 minutes before the scheduled appointment time and to have medication bottles and any blood pressure readings ready for review. The patient agreed to meet with the pharmacist via telephone visit on (date/time). 07/20/24   Le[facility]  [facility] Guide  Direct Dial: [phone]  Fax [phone]

## 2024-07-21 ENCOUNTER — Other Ambulatory Visit

## 2024-07-21 DIAGNOSIS — E876 Hypokalemia: Secondary | ICD-10-CM | POA: Diagnosis not present

## 2024-07-21 DIAGNOSIS — K8020 Calculus of gallbladder without cholecystitis without obstruction: Secondary | ICD-10-CM | POA: Diagnosis not present

## 2024-07-21 LAB — CBC WITH DIFFERENTIAL/PLATELET
Basophils Absolute: 0.1 x10E3/uL (ref 0.0–0.2)
Basos: 2 %
EOS (ABSOLUTE): 0.1 x10E3/uL (ref 0.0–0.4)
Eos: 2 %
Hematocrit: 42 % (ref 34.0–46.6)
Hemoglobin: 13.7 g/dL (ref 11.1–15.9)
Immature Grans (Abs): 0 x10E3/uL (ref 0.0–0.1)
Immature Granulocytes: 0 %
Lymphocytes Absolute: 1.7 x10E3/uL (ref 0.7–3.1)
Lymphs: 35 %
MCH: 34.7 pg — ABNORMAL HIGH (ref 26.6–33.0)
MCHC: 32.6 g/dL (ref 31.5–35.7)
MCV: 106 fL — ABNORMAL HIGH (ref 79–97)
Monocytes Absolute: 0.8 x10E3/uL (ref 0.1–0.9)
Monocytes: 16 %
Neutrophils Absolute: 2.1 x10E3/uL (ref 1.4–7.0)
Neutrophils: 45 %
RBC: 3.95 x10E6/uL (ref 3.77–5.28)
RDW: 11 % — ABNORMAL LOW (ref 11.7–15.4)
WBC: 4.8 x10E3/uL (ref 3.4–10.8)

## 2024-07-21 LAB — COMPREHENSIVE METABOLIC PANEL WITH GFR
ALT: 39 IU/L — ABNORMAL HIGH (ref 0–32)
AST: 88 IU/L — ABNORMAL HIGH (ref 0–40)
Albumin: 3.9 g/dL (ref 3.8–4.8)
Alkaline Phosphatase: 150 IU/L — ABNORMAL HIGH (ref 44–121)
BUN/Creatinine Ratio: 11 — ABNORMAL LOW (ref 12–28)
BUN: 7 mg/dL — ABNORMAL LOW (ref 8–27)
Bilirubin Total: 0.6 mg/dL (ref 0.0–1.2)
CO2: 21 mmol/L (ref 20–29)
Calcium: 9.4 mg/dL (ref 8.7–10.3)
Chloride: 103 mmol/L (ref 96–106)
Creatinine, Ser: 0.66 mg/dL (ref 0.57–1.00)
Globulin, Total: 2.9 g/dL (ref 1.5–4.5)
Glucose: 61 mg/dL — ABNORMAL LOW (ref 70–99)
Potassium: 3.7 mmol/L (ref 3.5–5.2)
Sodium: 141 mmol/L (ref 134–144)
Total Protein: 6.8 g/dL (ref 6.0–8.5)
eGFR: 93 mL/min/1.73 (ref >59)

## 2024-07-21 NOTE — Progress Notes (Signed)
   07/21/2024  Patient ID: Kathy [name], female   DOB: 03-18-1951, 73 y.o.   MRN: [id number]  Patient should qualify for Restasis  assistance through [employer]. Signed paperwork in office. Patient to bring in her proof of income to office for submission.  Jon VEAR [place], PharmD Clinical Pharmacist [phone]

## 2024-07-22 ENCOUNTER — Ambulatory Visit: Payer: Self-pay | Admitting: Family Medicine

## 2024-07-26 ENCOUNTER — Ambulatory Visit: Payer: Self-pay

## 2024-07-26 NOTE — Telephone Encounter (Signed)
 Noted,  Thank you!

## 2024-07-26 NOTE — Telephone Encounter (Signed)
 FYI Only or Action Required?: FYI only for provider.  Patient was last seen in primary care on 07/20/2024 by Joyce Norleen BROCKS, MD.  Called Nurse Triage reporting Shoulder Pain.  Symptoms began a week ago.  Interventions attempted: OTC medications: Ibuprofen  and Tylenol .  Symptoms are: stable.  Triage Disposition: See PCP When Office is Open (Within 3 Days)  Patient/caregiver understands and will follow disposition?: Yes    Copied from CRM #[id number]. Topic: Clinical - Red Word Triage >> Jul 26, 2024 10:43 AM Selinda RAMAN wrote: Red Word that prompted transfer to Nurse Triage: The patient called in complaining of wheezing, a cough, pain in her left shoulder and swelling in her ankles. I will transfer her to E2C2 NT. Reason for Disposition  [1] MODERATE pain (e.g., interferes with normal activities) AND [2] present > 3 days  Answer Assessment - Initial Assessment Questions Constant pain, denies radiating down arm or up neck/jaw. States she started with Right shoulder pain 6 months ago, got an ultrasound and a shot. States she feels like the same thing is happening to the left side.  1. ONSET: When did the pain start?     Last week  2. LOCATION: Where is the pain located?     Front of shoulder  3. PAIN: How bad is the pain? (Scale 1-10; or mild, moderate, severe)     10/10  4. WORK OR EXERCISE: Has there been any recent work or exercise that involved this part of the body?     No  5. CAUSE: What do you think is causing the shoulder pain?     Unsure, states its the same thing that happened to her R shoulder a few months back.  6. OTHER SYMPTOMS: Do you have any other symptoms? (e.g., neck pain, swelling, rash, fever, numbness, weakness)     Wheezing, Cough,( diagnosed with Pulmonary sarcoidosis) ankle swelling.  Protocols used: Shoulder Pain-A-AH

## 2024-07-27 ENCOUNTER — Encounter: Payer: Self-pay | Admitting: Family Medicine

## 2024-07-27 ENCOUNTER — Ambulatory Visit
Admission: RE | Admit: 2024-07-27 | Discharge: 2024-07-27 | Disposition: A | Discharge status: Home / Self Care | Source: Ambulatory Visit | Attending: Family Medicine | Admitting: Family Medicine

## 2024-07-27 ENCOUNTER — Ambulatory Visit (INDEPENDENT_AMBULATORY_CARE_PROVIDER_SITE_OTHER): Admitting: Family Medicine

## 2024-07-27 ENCOUNTER — Telehealth: Payer: Self-pay

## 2024-07-27 VITALS — BP 148/80 | HR 74 | Ht 62.0 in | Wt 175.4 lb

## 2024-07-27 DIAGNOSIS — M25512 Pain in left shoulder: Secondary | ICD-10-CM

## 2024-07-27 DIAGNOSIS — R051 Acute cough: Secondary | ICD-10-CM | POA: Diagnosis not present

## 2024-07-27 DIAGNOSIS — J301 Allergic rhinitis due to pollen: Secondary | ICD-10-CM

## 2024-07-27 DIAGNOSIS — D860 Sarcoidosis of lung: Secondary | ICD-10-CM

## 2024-07-27 DIAGNOSIS — R609 Edema, unspecified: Secondary | ICD-10-CM

## 2024-07-27 DIAGNOSIS — M19012 Primary osteoarthritis, left shoulder: Secondary | ICD-10-CM | POA: Diagnosis not present

## 2024-07-27 MED ORDER — ALBUTEROL SULFATE HFA 108 (90 BASE) MCG/ACT IN AERS: 2.0000 | INHALATION_SPRAY | Freq: Four times a day (QID) | RESPIRATORY_TRACT | 2 refills | Status: AC | PRN

## 2024-07-27 NOTE — Progress Notes (Signed)
   Subjective:    Patient ID: Kathy [name], female    DOB: 1951-06-21, 73 y.o.   MRN: [id number]  Discussed the use of AI scribe software for clinical note transcription with the patient, who gave verbal consent to proceed.  History of Present Illness   Kathy [name] is a 73 year old female with sarcoidosis who presents with coughing and wheezing.  She has been experiencing coughing and wheezing since Monday. There is no fever, chills, sore throat, or earache. She has a history of sarcoidosis and was previously prescribed an inhaler, which she has not used for years as she was doing well without it. She can hear herself wheezing and is uncertain if she needs the inhaler again.  She reports left shoulder pain that began about a month ago, occurring when reaching for something. She denies any injury to the shoulder. She has had similar issues with her right shoulder in the past, which was treated with an ultrasound-guided injection approximately two years ago, providing relief at that time.  Additionally, she mentions swelling in both feet that started on Sunday. There is no shortness of breath, chest pain, or waking up at night short of breath. The swelling improves with ice application. She does note that the swelling is less in the morning compared to later in the day.           Review of Systems     Objective:    Physical Exam Alert and in no distress. Tympanic membranes and canals are normal. Pharyngeal area is normal. Neck is supple without adenopathy or thyromegaly. Cardiac exam shows a regular sinus rhythm without murmurs or gallops. Lungs are clear to auscultation. Left shoulder exam shows full range of motion with pain with abduction and external and internal rotation.  No tenderness over AC joint.  Negative sulcus sign.  Neer's and Hawkins test was uncomfortable.     Assessment & Plan:  Assessment and Plan    Cough and wheezing Possible viral etiology. Sarcoidosis  with previous inhaler use. - Prescribed albuterol  inhaler for wheezing management. - She is to call if symptoms continue. Left shoulder pain Possible arthritis. Similar symptoms in right shoulder previously treated with ultrasound and injection. - Ordered x-ray of left shoulder. - Scheduled follow-up with Dr. Vita for further evaluation and possible ultrasound and injection.  Dependent lower extremity edema Likely dependent edema due to prolonged sitting and age-related changes. - Advised use of supportive elastic socks. - Recommended elevation of feet.

## 2024-07-27 NOTE — Progress Notes (Signed)
   07/27/2024  Patient ID: Kathy [name], female   DOB: 11-13-1951, 73 y.o.   MRN: [id number]  Patient returned her proof of income. Submitted [facility]  PAP through [employer] via fax. Pending decision. Routing to med assistance team for follow up.  Jon VEAR Lindau, PharmD Clinical Pharmacist [phone]

## 2024-07-31 ENCOUNTER — Ambulatory Visit: Payer: Self-pay | Admitting: Family Medicine

## 2024-08-01 ENCOUNTER — Ambulatory Visit: Admitting: Family Medicine

## 2024-08-01 ENCOUNTER — Encounter: Payer: Self-pay | Admitting: Family Medicine

## 2024-08-01 VITALS — BP 130/84 | HR 103 | Wt 175.8 lb

## 2024-08-01 DIAGNOSIS — M25512 Pain in left shoulder: Secondary | ICD-10-CM

## 2024-08-01 MED ORDER — MELOXICAM 15 MG PO TABS: 15.0000 mg | ORAL_TABLET | Freq: Every day | ORAL | 0 refills | Status: DC

## 2024-08-01 NOTE — Progress Notes (Signed)
   Name: Kathy [name]   Date of Visit: 08/01/24   Date of last visit with me: Visit date not found   CHIEF COMPLAINT:  Chief Complaint  Patient presents with   Acute Visit    Left shoulder pain.       HPI:  Discussed the use of AI scribe software for clinical note transcription with the patient, who gave verbal consent to proceed.  History of Present Illness   Kathy [name] is a 73 year old female who presents with right shoulder pain. She is accompanied by her son.  The right shoulder pain began approximately one month ago after she pulled a heavy liquid detergent down from a cabinet. The pain is exacerbated by reaching up and is described as an 8 out of 10 in severity. There has been no noticeable improvement or worsening over time.  She has a history of receiving a shot in her left shoulder previously, but not in the right shoulder. She has been using Tylenol  for pain relief, which has not been effective. She recalls previously taking meloxicam  without any kidney issues.  No recent use of ibuprofen  or other anti-inflammatory medications.         OBJECTIVE:   BP 130/84   Pulse (!) 103   Wt 175 lb 12.8 oz (79.7 kg)   SpO2 98%   BMI 32.15 kg/m    Ortho Exam  Inspection reveals no gross abnormalities of the bilateral shoulder.  There is some mild tenderness to palpation over the right humeral head as well as the left humeral head.  There is some noted weakness on the left shoulder with abduction against resistance compared to the right.  There is also pain noted with external rotation against resistance.  Internal rotation with resistance and passively is okay.  Empty can is positive, Hawkins negative. IMAGING:  Previous x-ray on 07/27/2024 independently reviewed by me.  X-ray does show some moderate glenohumeral joint narrowing more so located at the medial inferior aspect within noted superior.  There is also some significant AC joint narrowing.  No other acute  abnormalities noted at this time.  There is a moderate size spur at the medial inferior aspect along the humeral head of the glenohumeral joint.  ASSESSMENT/PLAN:   Assessment & Plan Acute pain of left shoulder    Assessment and Plan    Left shoulder rotator cuff injury Suspected rotator cuff injury with significant pain, strength preserved, no surgical intervention needed. Differential includes osteoarthritis, but presentation favors rotator cuff injury. - Prescribed meloxicam  for pain. - Instructed on daily or at least five times a week home exercises for rotator cuff strengthening. - Scheduled follow-up in 4-6 weeks to reassess. - Consider corticosteroid injection if pain persists.  Left shoulder osteoarthritis X-ray shows degenerative changes, but pain likely due to rotator cuff injury. Osteoarthritis not primary cause of symptoms. - Monitor symptoms, manage with strengthening exercises and anti-inflammatory medication.         Raymont Andreoni A. Vita MD [facility]

## 2024-08-03 ENCOUNTER — Telehealth: Payer: Self-pay | Admitting: Podiatry

## 2024-08-03 ENCOUNTER — Telehealth: Payer: Self-pay

## 2024-08-03 NOTE — Telephone Encounter (Signed)
 Called patient to reschedule to later date or move to A. Regals schedule.

## 2024-08-03 NOTE — Progress Notes (Signed)
   08/03/2024  Patient ID: Kathy [name], female   DOB: 03-10-1951, 73 y.o.   MRN: [id number]  Received notification from [employer] PAP that patient has been approved to receive [facility]  through 11/29/24. Attempted to notify patient, had to leave a voicemail.  Jon VEAR Lindau, PharmD Clinical Pharmacist [phone]

## 2024-08-07 ENCOUNTER — Telehealth: Payer: Self-pay | Admitting: Family Medicine

## 2024-08-07 NOTE — Telephone Encounter (Signed)
 PAP [employer]  received, left message for pt

## 2024-08-21 ENCOUNTER — Ambulatory Visit: Admitting: Podiatry

## 2024-08-21 ENCOUNTER — Ambulatory Visit

## 2024-08-21 DIAGNOSIS — M79675 Pain in left toe(s): Secondary | ICD-10-CM

## 2024-08-21 DIAGNOSIS — B351 Tinea unguium: Secondary | ICD-10-CM | POA: Diagnosis not present

## 2024-08-21 DIAGNOSIS — B353 Tinea pedis: Secondary | ICD-10-CM

## 2024-08-21 DIAGNOSIS — M79674 Pain in right toe(s): Secondary | ICD-10-CM | POA: Diagnosis not present

## 2024-08-21 NOTE — Progress Notes (Signed)
 Subjective: Nail care/routine DFC   73 y.o. returns the office today with her son for painful, elongated, thickened toenails which she cannot trim herself. Denies any redness or drainage around the nails.    States that she gets pain on the bottom of the big toes, today at nighttime.  No injuries.  PCP: Joyce Norleen BROCKS, MD Last Seen: December 23, 2022  Objective: AAO 3, NAD DP/PT pulses palpable, CRT less than 3 seconds Protective sensation intact with Sallye Speed monofilament Nails hypertrophic, dystrophic, elongated, brittle, discolored 10. There is tenderness overlying the nails 1-5 bilaterally. There is no surrounding erythema or drainage along the nail sites. Flatfoot Subjectively getting mild tenderness to palpation of the plantar aspect of bilateral hallux.  There is no open lesions there is no area pinpoint tenderness there is no edema.  There is no erythema. No pain with calf compression, swelling, warmth, erythema.  Assessment: Dermatophytosis of nail  Pain in toes of both feet  Tinea pedis of both feet   Plan: Symptomatic onychomycosis -Nails sharply debrided 10 without complication/bleeding.  Toe pain b/l hallux - Patient did pick up the cream following last visit to aid in pain in this area.  She expresses that it has improved somewhat but not completely.  She does not wish to pursue additional treatment at this time.  Tinea Pedis - Mild tinea pedis plantar aspect of bilateral feet in moccasin distribution.  We did discuss different treatment options.  She states that she has athlete's foot cream at home and would like to attempt the over-the-counter medication she has.  Return to clinic in 3 months for RFC with Dr. Gershon Prentice Ovens, DPM

## 2024-08-31 ENCOUNTER — Ambulatory Visit: Admitting: Family Medicine

## 2024-09-22 ENCOUNTER — Ambulatory Visit

## 2024-09-22 DIAGNOSIS — Z23 Encounter for immunization: Secondary | ICD-10-CM

## 2024-10-02 ENCOUNTER — Other Ambulatory Visit: Payer: Self-pay | Admitting: Family Medicine

## 2024-10-02 DIAGNOSIS — M25512 Pain in left shoulder: Secondary | ICD-10-CM

## 2024-10-06 ENCOUNTER — Other Ambulatory Visit (INDEPENDENT_AMBULATORY_CARE_PROVIDER_SITE_OTHER): Payer: Self-pay

## 2024-10-06 DIAGNOSIS — Z23 Encounter for immunization: Secondary | ICD-10-CM

## 2024-10-06 NOTE — Progress Notes (Unsigned)
 Patient is in office today for a nurse visit for Immunization. Patient Injection was given in the  Right deltoid. Patient tolerated injection well.

## 2024-10-09 ENCOUNTER — Ambulatory Visit: Admitting: Family Medicine

## 2024-10-10 ENCOUNTER — Other Ambulatory Visit (INDEPENDENT_AMBULATORY_CARE_PROVIDER_SITE_OTHER): Payer: Self-pay

## 2024-10-10 ENCOUNTER — Ambulatory Visit: Admitting: Family Medicine

## 2024-10-10 ENCOUNTER — Encounter: Payer: Self-pay | Admitting: Family Medicine

## 2024-10-10 VITALS — BP 134/82 | HR 104 | Wt 173.2 lb

## 2024-10-10 DIAGNOSIS — M25511 Pain in right shoulder: Secondary | ICD-10-CM | POA: Diagnosis not present

## 2024-10-10 DIAGNOSIS — M25512 Pain in left shoulder: Secondary | ICD-10-CM

## 2024-10-10 MED ORDER — MELOXICAM 15 MG PO TABS: 15.0000 mg | ORAL_TABLET | Freq: Every day | ORAL | 0 refills | Status: AC

## 2024-10-10 NOTE — Progress Notes (Signed)
   Name: Kathy [name]   Date of Visit: 10/10/24   Date of last visit with me: 10/02/2024   CHIEF COMPLAINT:  Chief Complaint  Patient presents with   Follow-up    Shoulder follow up.        HPI:  Discussed the use of AI scribe software for clinical note transcription with the patient, who gave verbal consent to proceed.  History of Present Illness   Kathy [name] is a 73 year old female with a history of arthritis and rotator cuff tear who presents with shoulder pain.  She experiences persistent shoulder pain, particularly when reaching up or pulling things down. The pain remains unchanged since her last visit.  She has a history of receiving a shoulder injection from a sports medicine physician in the past, which provided some relief.  Her sleep is affected by the shoulder pain, as she cannot sleep on the affected side and often sleeps on her back instead.  She inquires about a prescription for meloxicam , which she has used previously for pain management.         OBJECTIVE:       12/28/2023    1:49 PM  Depression screen PHQ 2/9  Decreased Interest 0  Down, Depressed, Hopeless 0  PHQ - 2 Score 0     BP Readings from Last 3 Encounters:  10/10/24 134/82  08/01/24 130/84  07/27/24 (!) 148/80    BP 134/82   Pulse (!) 104   Wt 173 lb 3.2 oz (78.6 kg)   BMI 31.68 kg/m    Physical Exam          Physical Exam   Decreased range of motion of the left shoulder with abduction and external rotation.  Pain with resisted external rotation and abduction. ASSESSMENT/PLAN:   Assessment & Plan Acute pain of right shoulder  Acute pain of left shoulder    Assessment and Plan    Right shoulder pain due to rotator cuff tear and osteoarthritis Chronic pain from rotator cuff tear and osteoarthritis, worsened by movement. Previous injection. Current plan includes corticosteroid injection and muscle strengthening. - Administered corticosteroid injection to right  shoulder. - Prescribed meloxicam  for future use if needed.  - Advised continuation of shoulder exercises. - Instructed to rest shoulder for two days post-injection. - Scheduled follow-up in two months to assess pain management and consider repeat injection.     US -guided glenohumeral joint injection, Left shoulder After discussion on risks/benefits/indications, informed verbal consent was obtained. A timeout was then performed. The patient was positioned lying lateral recumbent on examination table. The patient's shoulder was prepped with betadine  and multiple alcohol swabs  and utilizing ultrasound guidance, the patient's glenohumeral joint was identified on ultrasound. Using ultrasound guidance a 22-gauge, 3.5 inch needle with a mixture of 2:2:2 cc's lidocaine :bupivicaine:depomedrol was directed from a lateral to medial direction via in-plane technique into the glenohumeral joint with visualization of appropriate spread of injectate into the joint. Patient tolerated the procedure well without immediate complications.        Kammie Scioli A. Vita MD [facility]

## 2024-11-20 ENCOUNTER — Ambulatory Visit: Admitting: Podiatry

## 2024-11-20 DIAGNOSIS — M79675 Pain in left toe(s): Secondary | ICD-10-CM

## 2024-11-20 DIAGNOSIS — M79674 Pain in right toe(s): Secondary | ICD-10-CM

## 2024-11-20 DIAGNOSIS — B351 Tinea unguium: Secondary | ICD-10-CM | POA: Diagnosis not present

## 2024-11-20 DIAGNOSIS — M25472 Effusion, left ankle: Secondary | ICD-10-CM | POA: Diagnosis not present

## 2024-11-20 NOTE — Progress Notes (Signed)
 Subjective: Chief Complaint  Patient presents with   RFC    Non diabetic routine foot care     73 y.o. returns the office today with her son for painful, elongated, thickened toenails which she cannot trim herself. Denies any redness or drainage around the nails.    Occasional swelling to the left ankle but no pain associate with this.  No swelling in the leg.  No recent injuries.  PCP: Joyce Norleen BROCKS, MD Last Seen: December 23, 2022  Objective: AAO 3, NAD DP/PT pulses palpable, CRT less than 3 seconds Protective sensation intact with Sallye Speed monofilament Nails hypertrophic, dystrophic, elongated, brittle, discolored 10. There is tenderness overlying the nails 1-5 bilaterally. There is no surrounding erythema or drainage along the nail sites. Flatfoot Not able to appreciate any significant edema to the ankles today.  No erythema or warmth.  Range of motion intact right knee pain. No pain with calf compression, swelling, warmth, erythema.  Assessment: Dermatophytosis of nail  Pain in toes of both feet   Plan: Symptomatic onychomycosis -Nails sharply debrided 10 without complication/bleeding.   Ankle swelling -Not able to appreciate today.  Discussed compression, elevation.  Try to avoid excess standing or for prolonged standing or walking.  Elevation.  Return in about 3 months (around 02/18/2025).  Prentice Ovens, DPM

## 2024-11-27 MED ORDER — BUPIVACAINE HCL 0.25 % IJ SOLN
2.0000 mL | Freq: Once | INTRAMUSCULAR | Status: AC
  Administered 2024-11-27: 2 mL

## 2024-11-27 MED ORDER — METHYLPREDNISOLONE ACETATE 40 MG/ML IJ SUSP
40.0000 mg | Freq: Once | INTRAMUSCULAR | Status: AC
  Administered 2024-11-27: 40 mg via INTRAMUSCULAR

## 2024-11-27 MED ORDER — LIDOCAINE HCL 1 % IJ SOLN
5.0000 mL | Freq: Once | INTRAMUSCULAR | Status: AC
  Administered 2024-11-27: 5 mL via INTRADERMAL

## 2024-11-27 NOTE — Addendum Note (Signed)
 Addended by: LATTIE CARLO BROCKS on: 11/27/2024 12:11 PM   Modules accepted: Orders

## 2024-12-06 ENCOUNTER — Other Ambulatory Visit: Payer: Self-pay | Admitting: Family Medicine

## 2024-12-06 DIAGNOSIS — I10 Essential (primary) hypertension: Secondary | ICD-10-CM

## 2024-12-06 NOTE — Telephone Encounter (Signed)
Pt has an appt later on this month 

## 2024-12-21 ENCOUNTER — Telehealth: Payer: Self-pay

## 2024-12-21 NOTE — Progress Notes (Signed)
" ° °  12/21/2024  Patient ID: Kathy [name], female   DOB: August 05, 1951, 74 y.o.   MRN: [id number]  Patient's program enrollment for [facility]  assistance has ended as of 11/29/24.  Will coordinate with CPhT to assist patient with renewal.  Jon VEAR Lindau, PharmD Clinical Pharmacist [phone]  "

## 2024-12-21 NOTE — Telephone Encounter (Signed)
 Gave pt a call to let her know,she will be receiving PAP [employer] ([facility] ), pt did not answer, will mail out pt portion and faxed provider portion.

## 2024-12-27 ENCOUNTER — Ambulatory Visit (INDEPENDENT_AMBULATORY_CARE_PROVIDER_SITE_OTHER): Payer: Self-pay | Admitting: Family Medicine

## 2024-12-27 ENCOUNTER — Encounter: Payer: Self-pay | Admitting: Family Medicine

## 2024-12-27 VITALS — BP 140/76 | HR 105 | Ht 62.0 in | Wt 178.0 lb

## 2024-12-27 DIAGNOSIS — I10 Essential (primary) hypertension: Secondary | ICD-10-CM

## 2024-12-27 DIAGNOSIS — Z96652 Presence of left artificial knee joint: Secondary | ICD-10-CM

## 2024-12-27 DIAGNOSIS — Z9849 Cataract extraction status, unspecified eye: Secondary | ICD-10-CM | POA: Diagnosis not present

## 2024-12-27 DIAGNOSIS — E6609 Other obesity due to excess calories: Secondary | ICD-10-CM

## 2024-12-27 DIAGNOSIS — J301 Allergic rhinitis due to pollen: Secondary | ICD-10-CM

## 2024-12-27 DIAGNOSIS — H5010 Unspecified exotropia: Secondary | ICD-10-CM

## 2024-12-27 DIAGNOSIS — Z0000 Encounter for general adult medical examination without abnormal findings: Secondary | ICD-10-CM | POA: Diagnosis not present

## 2024-12-27 DIAGNOSIS — M1990 Unspecified osteoarthritis, unspecified site: Secondary | ICD-10-CM | POA: Diagnosis not present

## 2024-12-27 DIAGNOSIS — N1831 Chronic kidney disease, stage 3a: Secondary | ICD-10-CM

## 2024-12-27 DIAGNOSIS — I5032 Chronic diastolic (congestive) heart failure: Secondary | ICD-10-CM | POA: Diagnosis not present

## 2024-12-27 DIAGNOSIS — I700 Atherosclerosis of aorta: Secondary | ICD-10-CM | POA: Diagnosis not present

## 2024-12-27 DIAGNOSIS — D860 Sarcoidosis of lung: Secondary | ICD-10-CM | POA: Diagnosis not present

## 2024-12-27 DIAGNOSIS — M797 Fibromyalgia: Secondary | ICD-10-CM

## 2024-12-27 DIAGNOSIS — H3530 Unspecified macular degeneration: Secondary | ICD-10-CM

## 2024-12-27 DIAGNOSIS — N3281 Overactive bladder: Secondary | ICD-10-CM

## 2024-12-27 MED ORDER — SOLIFENACIN SUCCINATE 5 MG PO TABS: 5.0000 mg | ORAL_TABLET | Freq: Every day | ORAL | 1 refills | Status: AC

## 2024-12-27 MED ORDER — ROSUVASTATIN CALCIUM 20 MG PO TABS: 20.0000 mg | ORAL_TABLET | Freq: Every day | ORAL | 3 refills | Status: AC

## 2024-12-27 MED ORDER — AMLODIPINE BESYLATE 5 MG PO TABS: 5.0000 mg | ORAL_TABLET | Freq: Every day | ORAL | 3 refills | Status: AC

## 2024-12-27 NOTE — Progress Notes (Signed)
 Kathy [name] is a 74 y.o. female who presents for annual wellness visit ,CPE and follow-up on chronic medical conditions.  Discussed the use of AI scribe software for clinical note transcription with the patient, who gave verbal consent to proceed. Discussed the use of AI scribe software for clinical note transcription with the patient, who gave verbal consent to proceed.  History of Present Illness     She is up to date with most immunizations, including tetanus, pneumonia, COVID, and flu vaccines. She has had a mammogram and does not require a Pap smear. Her last colonoscopy in 2024 showed no abnormalities.  She has a history of macular degeneration and regularly sees a retina specialist for this condition. She has not experienced any falls or depression.  She underwent knee replacement surgery and experiences some pain in the knee.  She experiences frequent urination with a sensation of pressure that requires her to urinate quickly. She is not currently taking any medication for this issue but is open to trying medication. She has been seen by cardiology in the past but presently is having no chest pain, shortness of breath or PND.  She does have evidence of aortic atherosclerosis.  Her allergies are under good control.  She is followed regularly by ophthalmology has had previous cataract surgery as well as being followed for her macular degeneration and esotropia that she has had her entire life. She manages fibromyalgia with Tylenol , which she finds effective. She has a history of cataract surgery and aortic atherosclerosis. Bending down sometimes causes her heart to beat noticeably, but this resolves when she sits up. She has had a TKR and is still having some difficulty with pain. She takes amlodipine  for blood pressure management and reports her blood pressure is usually around 140/76. She has a blood pressure cuff at home but has not compared its readings with those at the  clinic.    Immunizations and Health Maintenance Immunization History  Administered Date(s) Administered   Fluad Quad(high Dose 65+) 08/31/2019, 09/02/2020, 09/03/2021, 08/20/2022   Fluad Trivalent(High Dose 65+) 08/25/2023   INFLUENZA, HIGH DOSE SEASONAL PF 09/30/2016, 09/01/2017, 08/24/2018, 09/22/2024   Influenza Split 08/28/2011, 09/23/2012   Influenza,inj,Quad PF,6+ Mos 09/13/2013, 08/30/2014, 09/10/2015   PFIZER Comirnaty(Gray Top)Covid-19 Tri-Sucrose Vaccine 03/25/2021   PFIZER(Purple Top)SARS-COV-2 Vaccination 01/23/2020, 02/13/2020, 09/02/2020   PNEUMOCOCCAL CONJUGATE-20 12/28/2023   PPD Test 01/16/2021   Pfizer Covid-19 Vaccine Bivalent Booster 38yrs & up 09/03/2021   Pfizer(Comirnaty)Fall Seasonal Vaccine 12 years and older 09/23/2022, 08/25/2023, 10/06/2024   Pneumococcal Conjugate-13 08/30/2014   Pneumococcal Polysaccharide-23 02/03/2016   Tdap 02/03/2016   Zoster Recombinant(Shingrix) 09/16/2017   Zoster, Live 01/28/2012   Health Maintenance Due  Topic Date Due   Zoster Vaccines- Shingrix (2 of 2) 11/11/2017   Medicare Annual Wellness (AWV)  12/27/2024    Last Pap smear: ? Last mammogram: 04/19/24  Last colonoscopy: 03/23/23 Kathy [name]  Last DEXA:10/20/2018 Dentist: Kathy [name] Upcoming appt [facility]: [facility] 11/25 Exercise: No routine  Other doctors caring for patient include:  Advanced directives: Does Patient Have a Medical Advance Directive?: No Would patient like information on creating a medical advance directive?: Yes (ED - Information included in AVS)  Depression screen:  See questionnaire below.     12/27/2024   10:03 AM 12/28/2023    1:49 PM 11/25/2022    3:26 PM 11/03/2022   10:08 AM 09/03/2021    8:52 AM  Depression screen PHQ 2/9  Decreased Interest 0 0 0 0 0  Down, Depressed, Hopeless 0 0 1 0 0  PHQ - 2 Score 0 0 1 0 0    Fall Risk Screen: see questionnaire below.    12/27/2024    9:57 AM 12/28/2023    1:48 PM 11/25/2022     3:26 PM 11/03/2022   10:07 AM 09/03/2021    8:51 AM  Fall Risk   Falls in the past year? 0 1 0 0 0  Number falls in past yr: 0 0 0 0 0  Comment  June 2024-tripped over something at the house     Injury with Fall? 0 0   0  0   Risk for fall due to : No Fall Risks No Fall Risks No Fall Risks No Fall Risks History of fall(s)  Follow up Falls evaluation completed Falls evaluation completed Falls evaluation completed  Falls evaluation completed  Falls evaluation completed      Data saved with a previous flowsheet row definition    ADL screen:  See questionnaire below Functional Status Survey:     Review of Systems Constitutional: -, -unexpected weight change, -anorexia, -fatigue Allergy: -sneezing, -itching, -congestion Dermatology: denies changing moles, rash, lumps ENT: -runny nose, -ear pain, -sore throat,  Cardiology:  -chest pain, -palpitations, -orthopnea, Respiratory: -cough, -shortness of breath, -dyspnea on exertion, -wheezing,  Gastroenterology: -abdominal pain, -nausea, -vomiting, -diarrhea, -constipation, -dysphagia Hematology: -bleeding or bruising problems Musculoskeletal: -arthralgias, -myalgias, -joint swelling, -back pain, - Ophthalmology: -vision changes,  Urology: -dysuria, -difficulty urinating,  -urinary frequency, -urgency, incontinence Neurology: -, -numbness, , -memory loss, -falls, -dizziness    PHYSICAL EXAM:   General Appearance: Alert, cooperative, no distress, appears stated age Head: Normocephalic, without obvious abnormality, atraumatic Eyes: PERRL, conjunctiva/corneas clear, EOM's intact, fundi benign Ears: Normal TM's and external ear canals Nose: Nares normal, mucosa normal, no drainage or sinus tenderness Throat: Lips, mucosa, and tongue normal; teeth and gums normal Neck: Supple, no lymphadenopathy;  thyroid :  no enlargement/tenderness/nodules; no carotid bruit or JVD Lungs: Clear to auscultation bilaterally without wheezes, rales or ronchi;  respirations unlabored Heart: Regular rate and rhythm, S1 and S2 normal, no murmur, rubor gallop Abdomen: Soft, non-tender, nondistended, normoactive bowel sounds,  no masses, no hepatosplenomegaly Extremities: No clubbing, cyanosis or edema Pulses: 2+ and symmetric all extremities Skin:  Skin color, texture, turgor normal, no rashes or lesions Lymph nodes: Cervical, supraclavicular, and axillary nodes normal Neurologic:  CNII-XII intact, normal strength, sensation and gait; reflexes 2+ and symmetric throughout Psych: Normal mood, affect, hygiene and grooming.  ASSESSMENT/PLAN: Assessment and Plan    Adult Wellness Visit Annual wellness visit conducted. Immunizations reviewed; shingles vaccine needed. Discussed advance directive and living will. - Obtain second shingles vaccine at a drugstore. - Complete advance directive and living will.  Essential hypertension Blood pressure 140/76, slightly elevated but acceptable. Discussed home monitoring. - Renewed amlodipine  prescription. - Monitor blood pressure at home. Macular degeneration  Overactive bladder Experiencing urgency and frequent urination. Discussed medication side effects. - Prescribed Vesicare   fibromyalgia Managed with Tylenol , effective. - Continue Tylenol  as needed for fibromyalgia.  Osteoarthritis, status post left total knee replacement Experiencing knee pain post-replacement. Discussed exercises to prevent falls. - Perform range of motion and strengthening exercises for the knee. - Consider orthopedic consultation if pain persists.  Obesity Weight well-managed. - Continue current weight management strategies.  Macular degeneration Regular follow-ups with Dr. Valdemar. - Continue regular follow-ups with Dr. Valdemar.  Chronic diastolic CHF under good control    Discussed monthly self breast exams and yearly  mammograms; at least 30 minutes of aerobic activity at least 5 days/week and weight-bearing exercise  2x/week; proper sunscreen use reviewed; healthy diet, including goals of calcium  and vitamin D intake and alcohol recommendations (less than or equal to 1 drink/day) reviewed; regular seatbelt use; changing batteries in smoke detectors.  Immunization recommendations discussed.  Colonoscopy recommendations reviewed   Medicare Attestation I have personally reviewed: The patient's medical and social history Their use of alcohol, tobacco or illicit drugs Their current medications and supplements The patient's functional ability including ADLs,fall risks, home safety risks, cognitive, and hearing and visual impairment Diet and physical activities Evidence for depression or mood disorders  The patient's weight, height, and BMI have been recorded in the chart.  I have made referrals, counseling, and provided education to the patient based on review of the above and I have provided the patient with a written personalized care plan for preventive services.     Kathy Jobs, MD   12/27/2024

## 2024-12-27 NOTE — Telephone Encounter (Signed)
 Received provider portion PAP [employer] ([facility] ) back from provider office witing on pt portion.

## 2025-02-06 ENCOUNTER — Ambulatory Visit: Payer: Medicare Other | Admitting: Family Medicine

## 2025-02-19 ENCOUNTER — Ambulatory Visit: Admitting: Podiatry

## 2025-06-26 ENCOUNTER — Encounter (INDEPENDENT_AMBULATORY_CARE_PROVIDER_SITE_OTHER): Admitting: Ophthalmology

## 2026-01-02 ENCOUNTER — Encounter: Admitting: Family Medicine
